# Patient Record
Sex: Female | Born: 1940
Health system: Southern US, Community
[De-identification: ages and names within clinical notes are randomized; demographics above are authoritative.]

## PROBLEM LIST (undated history)

## (undated) DIAGNOSIS — C801 Malignant (primary) neoplasm, unspecified: Secondary | ICD-10-CM

## (undated) DIAGNOSIS — D469 Myelodysplastic syndrome, unspecified: Secondary | ICD-10-CM

## (undated) DIAGNOSIS — M1990 Unspecified osteoarthritis, unspecified site: Secondary | ICD-10-CM

## (undated) DIAGNOSIS — H269 Unspecified cataract: Secondary | ICD-10-CM

## (undated) DIAGNOSIS — I10 Essential (primary) hypertension: Secondary | ICD-10-CM

## (undated) HISTORY — PX: JOINT REPLACEMENT: SHX530

## (undated) HISTORY — DX: Myelodysplastic syndrome, unspecified: D46.9

## (undated) HISTORY — PX: ABDOMINAL HYSTERECTOMY: SHX81

## (undated) HISTORY — PX: CHOLECYSTECTOMY: SHX55

## (undated) HISTORY — DX: Unspecified cataract: H26.9

## (undated) HISTORY — PX: STOMACH SURGERY: SHX791

## (undated) HISTORY — PX: APPENDECTOMY: SHX54

## (undated) HISTORY — PX: KNEE SURGERY: SHX244

## (undated) HISTORY — PX: LEG AMPUTATION ABOVE KNEE: SHX117

---

## 2009-05-12 DIAGNOSIS — E559 Vitamin D deficiency, unspecified: Secondary | ICD-10-CM | POA: Insufficient documentation

## 2009-09-19 DIAGNOSIS — M51369 Other intervertebral disc degeneration, lumbar region without mention of lumbar back pain or lower extremity pain: Secondary | ICD-10-CM | POA: Insufficient documentation

## 2009-11-05 DIAGNOSIS — K219 Gastro-esophageal reflux disease without esophagitis: Secondary | ICD-10-CM | POA: Insufficient documentation

## 2010-01-15 DIAGNOSIS — R079 Chest pain, unspecified: Secondary | ICD-10-CM | POA: Insufficient documentation

## 2010-11-13 LAB — HM DEXA SCAN: HM Dexa Scan: NEGATIVE

## 2011-02-03 DIAGNOSIS — J4520 Mild intermittent asthma, uncomplicated: Secondary | ICD-10-CM | POA: Insufficient documentation

## 2011-08-23 DIAGNOSIS — Z7901 Long term (current) use of anticoagulants: Secondary | ICD-10-CM | POA: Insufficient documentation

## 2012-12-30 DIAGNOSIS — Z8614 Personal history of Methicillin resistant Staphylococcus aureus infection: Secondary | ICD-10-CM | POA: Insufficient documentation

## 2013-08-06 DIAGNOSIS — W44F1XA Bezoar entering into or through a natural orifice, initial encounter: Secondary | ICD-10-CM | POA: Insufficient documentation

## 2013-08-06 DIAGNOSIS — T182XXA Foreign body in stomach, initial encounter: Secondary | ICD-10-CM | POA: Insufficient documentation

## 2014-01-18 LAB — HM COLONOSCOPY

## 2014-01-23 DIAGNOSIS — Z9889 Other specified postprocedural states: Secondary | ICD-10-CM | POA: Insufficient documentation

## 2014-02-26 DIAGNOSIS — I482 Chronic atrial fibrillation, unspecified: Secondary | ICD-10-CM | POA: Insufficient documentation

## 2014-05-09 DIAGNOSIS — M48061 Spinal stenosis, lumbar region without neurogenic claudication: Secondary | ICD-10-CM | POA: Insufficient documentation

## 2015-02-05 DIAGNOSIS — H1851 Endothelial corneal dystrophy: Secondary | ICD-10-CM

## 2015-02-05 DIAGNOSIS — H18519 Endothelial corneal dystrophy, unspecified eye: Secondary | ICD-10-CM | POA: Insufficient documentation

## 2015-02-05 DIAGNOSIS — Q141 Congenital malformation of retina: Secondary | ICD-10-CM | POA: Insufficient documentation

## 2015-02-05 DIAGNOSIS — H3530 Unspecified macular degeneration: Secondary | ICD-10-CM | POA: Insufficient documentation

## 2015-02-21 DIAGNOSIS — D500 Iron deficiency anemia secondary to blood loss (chronic): Secondary | ICD-10-CM | POA: Insufficient documentation

## 2015-08-19 DIAGNOSIS — N183 Chronic kidney disease, stage 3 unspecified: Secondary | ICD-10-CM | POA: Insufficient documentation

## 2015-08-19 DIAGNOSIS — E870 Hyperosmolality and hypernatremia: Secondary | ICD-10-CM | POA: Insufficient documentation

## 2015-08-19 DIAGNOSIS — H25011 Cortical age-related cataract, right eye: Secondary | ICD-10-CM | POA: Insufficient documentation

## 2015-08-19 DIAGNOSIS — N1832 Chronic kidney disease, stage 3b: Secondary | ICD-10-CM | POA: Insufficient documentation

## 2015-08-19 DIAGNOSIS — H04123 Dry eye syndrome of bilateral lacrimal glands: Secondary | ICD-10-CM | POA: Insufficient documentation

## 2015-08-19 DIAGNOSIS — Z961 Presence of intraocular lens: Secondary | ICD-10-CM | POA: Insufficient documentation

## 2015-08-19 DIAGNOSIS — H2511 Age-related nuclear cataract, right eye: Secondary | ICD-10-CM | POA: Insufficient documentation

## 2015-08-19 DIAGNOSIS — Z9842 Cataract extraction status, left eye: Secondary | ICD-10-CM

## 2016-01-30 DIAGNOSIS — Z86711 Personal history of pulmonary embolism: Secondary | ICD-10-CM | POA: Insufficient documentation

## 2016-02-05 LAB — HM MAMMOGRAPHY

## 2016-02-06 DIAGNOSIS — M810 Age-related osteoporosis without current pathological fracture: Secondary | ICD-10-CM | POA: Insufficient documentation

## 2016-05-28 ENCOUNTER — Emergency Department (INDEPENDENT_AMBULATORY_CARE_PROVIDER_SITE_OTHER): Payer: Medicare HMO

## 2016-05-28 ENCOUNTER — Emergency Department
Admission: EM | Admit: 2016-05-28 | Discharge: 2016-05-28 | Disposition: A | Discharge status: Home / Self Care | Payer: Medicare HMO | Source: Home / Self Care | Attending: Family Medicine | Admitting: Family Medicine

## 2016-05-28 ENCOUNTER — Encounter: Payer: Self-pay | Admitting: Emergency Medicine

## 2016-05-28 DIAGNOSIS — S93602A Unspecified sprain of left foot, initial encounter: Secondary | ICD-10-CM

## 2016-05-28 DIAGNOSIS — M2012 Hallux valgus (acquired), left foot: Secondary | ICD-10-CM | POA: Diagnosis not present

## 2016-05-28 DIAGNOSIS — M85872 Other specified disorders of bone density and structure, left ankle and foot: Secondary | ICD-10-CM | POA: Diagnosis not present

## 2016-05-28 HISTORY — DX: Malignant (primary) neoplasm, unspecified: C80.1

## 2016-05-28 HISTORY — DX: Essential (primary) hypertension: I10

## 2016-05-28 NOTE — Discharge Instructions (Signed)
Apply ice pack for 15 to 20 minutes, 3 to 4 times daily.  Elevate.  Use crutches for 3 to 5 days.  Wear Ace wrap until swelling decreases.  Begin range of motion and stretching exercises in about 5 days as per instruction sheet.  May take Tylenol as needed for pain.

## 2016-05-28 NOTE — ED Triage Notes (Signed)
Left foot injury 2 days ago, was sleeping in recliner suddenly jumped up and landed hard on floor with left foot felt a sharp, piercing pain up to right knee. She has a rod in the leg from when she had cancer at age 76. Pain from foot radiates up to right knee.

## 2016-05-28 NOTE — ED Provider Notes (Signed)
[name]    CSN: [id number] Arrival date & time: 05/28/16  1503     History   Chief Complaint Chief Complaint  Patient presents with  . Foot Injury    HPI Gloria Mckay is a 75 y.o. female.   While sleeping in her recliner two days ago, patient was startled awake and jumped up, landing firmly on her left foot.  She felt a sudden piercing pain that radiated to her right knee.  She has had persistent pain with weight bearing.  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  She is presently on coumadin, reporting that her most recent INR was 2.5 last week.   The history is provided by the patient.  Foot Pain  This is a new problem. The current episode started 2 days ago. The problem occurs constantly. The problem has not changed since onset.Associated symptoms comments: none. The symptoms are aggravated by walking. The symptoms are relieved by narcotics. She has tried nothing for the symptoms.    Past Medical History:  Diagnosis Date  . Cancer ([place])   . Hypertension     There are no active problems to display for this patient.   Past Surgical History:  Procedure Laterality Date  . JOINT REPLACEMENT    . STOMACH SURGERY      OB History    No data available       Home Medications    Prior to Admission medications   Medication Sig Start Date End Date Taking? Authorizing Provider  carvedilol (COREG) 25 MG tablet Take 25 mg by mouth 2 (two) times daily with a meal.   Yes Historical Provider, MD  clindamycin (CLEOCIN) 300 MG capsule Take 300 mg by mouth 3 (three) times daily.   Yes Historical Provider, MD  ergocalciferol (VITAMIN D2) 50000 units capsule Take 50,000 Units by mouth once a week.   Yes Historical Provider, MD  escitalopram (LEXAPRO) 10 MG tablet Take 10 mg by mouth daily.   Yes Historical Provider, MD  hydrALAZINE (APRESOLINE) 10 MG tablet Take 10 mg by mouth 3 (three) times daily.   Yes Historical Provider, MD    HYDROcodone-acetaminophen (NORCO) 7.5-325 MG tablet Take 1 tablet by mouth every 6 (six) hours as needed for moderate pain.   Yes Historical Provider, MD  losartan (COZAAR) 100 MG tablet Take 100 mg by mouth daily.   Yes Historical Provider, MD  topiramate (TOPAMAX) 100 MG tablet Take 100 mg by mouth 2 (two) times daily.   Yes Historical Provider, MD  warfarin (COUMADIN) 7.5 MG tablet Take 7.5 mg by mouth daily.   Yes Historical Provider, MD    Family History No family history on file.  Social History Social History  Substance Use Topics  . Smoking status: Never Smoker  . Smokeless tobacco: Never Used  . Alcohol use No     Allergies   Review of patient's allergies indicates no known allergies.   Review of Systems Review of Systems  All other systems reviewed and are negative.    Physical Exam Triage Vital Signs ED Triage Vitals  Enc Vitals Group     BP 05/28/16 1529 186/84     Pulse Rate 05/28/16 1529 63     Resp --      Temp 05/28/16 1529 97.7 F (36.5 C)     Temp Source 05/28/16 1529 Oral     SpO2 05/28/16 1529 99 %     Weight 05/28/16 1530  111 lb (50.3 kg)     Height 05/28/16 1530 5\' 3"  (1.6 m)     Head Circumference --      Peak Flow --      Pain Score 05/28/16 1535 7     Pain Loc --      Pain Edu? --      Excl. in [place]? --    No data found.   Updated Vital Signs BP 186/84 (BP Location: Left Arm)   Pulse 63   Temp 97.7 F (36.5 C) (Oral)   Ht 5\' 3"  (1.6 m)   Wt 111 lb (50.3 kg)   SpO2 99%   BMI 19.66 kg/m   Visual Acuity Right Eye Distance:   Left Eye Distance:   Bilateral Distance:    Right Eye Near:   Left Eye Near:    Bilateral Near:     Physical Exam  Constitutional: She appears well-developed and well-nourished. No distress.  HENT:  Head: Atraumatic.  Nose: Nose normal.  Mouth/Throat: Oropharynx is clear and moist.  Eyes: Conjunctivae are normal. Pupils are equal, round, and reactive to light.  Neck: Normal range of motion.   Cardiovascular: Normal heart sounds.   Pulmonary/Chest: Breath sounds normal.  Abdominal: There is no tenderness.  Musculoskeletal: She exhibits no edema.       Left foot: There is tenderness, bony tenderness, decreased capillary refill and laceration. There is normal range of motion, no swelling, no crepitus and no deformity.       Feet:  Left foot has diffuse dorsal tenderness as noted on diagram.  Distal neurovascular function is intact.  Hallux valgus is present.  Neurological: She is alert.  Skin: Skin is warm and dry.  Nursing note and vitals reviewed.    UC Treatments / Results  Labs (all labs ordered are listed, but only abnormal results are displayed) Labs Reviewed - No data to display  EKG  EKG Interpretation None       Radiology Dg Foot Complete Left  Result Date: 05/28/2016 CLINICAL DATA:  Dorsal foot pain over the metatarsals after injury 2 days ago. EXAM: LEFT FOOT - COMPLETE 3+ VIEW COMPARISON:  None. FINDINGS: The bones of the left foot are osteopenic in appearance without acute displaced fracture nor bone destruction. There is overlap of the great toe with second toe. There is a hallux valgus with first metatarsophalangeal angle of 65 degrees, normal less than 15 degrees. IMPRESSION: Hallux valgus; osteopenia.  No acute fracture or bone destruction. Electronically Signed   By: Ashley Royalty M.D.   On: 05/28/2016 16:32    Procedures Procedures (including critical care time)  Medications Ordered in UC Medications - No data to display   Initial Impression / Assessment and Plan / UC Course  I have reviewed the triage vital signs and the nursing notes.  Pertinent labs & imaging results that were available during my care of the patient were reviewed by me and considered in my medical decision making (see chart for details).  Clinical Course  Ace wrap applied.  Dispensed crutches Apply ice pack for 15 to 20 minutes, 3 to 4 times daily.  Elevate.  Use crutches  for 3 to 5 days.  Wear Ace wrap until swelling decreases.  Begin range of motion and stretching exercises in about 5 days as per instruction sheet.  May take Tylenol as needed for pain. Followup with Dr. Aundria Mems or Dr. Lynne Leader ([facility]) if not improving about two weeks.  Final Clinical Impressions(s) / UC Diagnoses   Final diagnoses:  Foot sprain, left, initial encounter    New Prescriptions New Prescriptions   No medications on file     Kandra Nicolas, MD 06/01/16 2214

## 2016-06-14 DIAGNOSIS — D101 Benign neoplasm of tongue: Secondary | ICD-10-CM | POA: Insufficient documentation

## 2016-09-13 DIAGNOSIS — I482 Chronic atrial fibrillation: Secondary | ICD-10-CM | POA: Diagnosis not present

## 2016-09-13 DIAGNOSIS — Z86718 Personal history of other venous thrombosis and embolism: Secondary | ICD-10-CM | POA: Diagnosis not present

## 2016-09-13 DIAGNOSIS — D500 Iron deficiency anemia secondary to blood loss (chronic): Secondary | ICD-10-CM | POA: Diagnosis not present

## 2016-09-13 DIAGNOSIS — Z7901 Long term (current) use of anticoagulants: Secondary | ICD-10-CM | POA: Diagnosis not present

## 2016-09-29 DIAGNOSIS — Z86718 Personal history of other venous thrombosis and embolism: Secondary | ICD-10-CM | POA: Diagnosis not present

## 2016-09-29 DIAGNOSIS — D649 Anemia, unspecified: Secondary | ICD-10-CM | POA: Diagnosis not present

## 2016-09-29 DIAGNOSIS — I482 Chronic atrial fibrillation: Secondary | ICD-10-CM | POA: Diagnosis not present

## 2016-09-29 DIAGNOSIS — Z7901 Long term (current) use of anticoagulants: Secondary | ICD-10-CM | POA: Diagnosis not present

## 2016-09-29 DIAGNOSIS — D500 Iron deficiency anemia secondary to blood loss (chronic): Secondary | ICD-10-CM | POA: Diagnosis not present

## 2016-10-01 LAB — POCT INR
INR: 2.2
INR: 2.2
INR: 2.2
INR: 2.2

## 2016-10-04 DIAGNOSIS — S79912A Unspecified injury of left hip, initial encounter: Secondary | ICD-10-CM | POA: Diagnosis not present

## 2016-10-04 DIAGNOSIS — K148 Other diseases of tongue: Secondary | ICD-10-CM | POA: Diagnosis not present

## 2016-10-04 DIAGNOSIS — T8450XS Infection and inflammatory reaction due to unspecified internal joint prosthesis, sequela: Secondary | ICD-10-CM | POA: Diagnosis not present

## 2016-10-04 DIAGNOSIS — Z780 Asymptomatic menopausal state: Secondary | ICD-10-CM | POA: Diagnosis not present

## 2016-10-05 DIAGNOSIS — M25562 Pain in left knee: Secondary | ICD-10-CM | POA: Diagnosis not present

## 2016-10-05 DIAGNOSIS — D509 Iron deficiency anemia, unspecified: Secondary | ICD-10-CM | POA: Diagnosis not present

## 2016-10-05 DIAGNOSIS — M7062 Trochanteric bursitis, left hip: Secondary | ICD-10-CM | POA: Diagnosis not present

## 2016-10-05 DIAGNOSIS — D500 Iron deficiency anemia secondary to blood loss (chronic): Secondary | ICD-10-CM | POA: Diagnosis not present

## 2016-10-05 DIAGNOSIS — K148 Other diseases of tongue: Secondary | ICD-10-CM | POA: Diagnosis not present

## 2016-10-14 ENCOUNTER — Emergency Department (INDEPENDENT_AMBULATORY_CARE_PROVIDER_SITE_OTHER): Payer: Medicare Other

## 2016-10-14 ENCOUNTER — Emergency Department (INDEPENDENT_AMBULATORY_CARE_PROVIDER_SITE_OTHER)
Admission: EM | Admit: 2016-10-14 | Discharge: 2016-10-14 | Disposition: A | Discharge status: Home / Self Care | Payer: Medicare Other | Source: Home / Self Care | Attending: Family Medicine | Admitting: Family Medicine

## 2016-10-14 ENCOUNTER — Encounter: Payer: Self-pay | Admitting: *Deleted

## 2016-10-14 DIAGNOSIS — W1849XA Other slipping, tripping and stumbling without falling, initial encounter: Secondary | ICD-10-CM

## 2016-10-14 DIAGNOSIS — S93401A Sprain of unspecified ligament of right ankle, initial encounter: Secondary | ICD-10-CM

## 2016-10-14 DIAGNOSIS — M25552 Pain in left hip: Secondary | ICD-10-CM

## 2016-10-14 DIAGNOSIS — M85871 Other specified disorders of bone density and structure, right ankle and foot: Secondary | ICD-10-CM

## 2016-10-14 DIAGNOSIS — S99921A Unspecified injury of right foot, initial encounter: Secondary | ICD-10-CM | POA: Diagnosis not present

## 2016-10-14 DIAGNOSIS — M25571 Pain in right ankle and joints of right foot: Secondary | ICD-10-CM | POA: Diagnosis not present

## 2016-10-14 DIAGNOSIS — S99911A Unspecified injury of right ankle, initial encounter: Secondary | ICD-10-CM | POA: Diagnosis not present

## 2016-10-14 DIAGNOSIS — M79671 Pain in right foot: Secondary | ICD-10-CM

## 2016-10-14 DIAGNOSIS — S79912A Unspecified injury of left hip, initial encounter: Secondary | ICD-10-CM | POA: Diagnosis not present

## 2016-10-14 HISTORY — DX: Unspecified osteoarthritis, unspecified site: M19.90

## 2016-10-14 NOTE — ED Provider Notes (Signed)
CSN: [id number]     Arrival date & time 10/14/16  1324 History   First MD Initiated Contact with Patient 10/14/16 1356     Chief Complaint  Patient presents with  . Hip Pain  . Foot Pain   (Consider location/radiation/quality/duration/timing/severity/associated sxs/prior Treatment) HPI Gloria Mckay is a 76 y.o. female presenting to UC with c/o Left hip and pain Right ankle and foot pain with mild swelling to the outside of foot for about 1 week after she slipped on some wet leaves outside. Pt uses a cane due to chronic Left hip and leg pain secondary to having a rod placed due to cancer in that leg when she was 76 years old.  She was seen by PCP and her orthopedist last week but no imaging was performed.  Orthopedist did give her a steroid shot in her Left upper thigh, per pt, but pt notes pain feels higher "in my hip joint" than where the injection was placed.  Denies numbness or tingling in legs. Denies change in bowel or bladder habits. Denies redness or bruising to skin. No hx of gout.    Past Medical History:  Diagnosis Date  . Arthritis   . Cancer ([place])   . Hypertension    Past Surgical History:  Procedure Laterality Date  . JOINT REPLACEMENT    . STOMACH SURGERY     History reviewed. No pertinent family history. Social History  Substance Use Topics  . Smoking status: Never Smoker  . Smokeless tobacco: Never Used  . Alcohol use No   OB History    No data available     Review of Systems  Musculoskeletal: Positive for arthralgias, gait problem, joint swelling and myalgias. Negative for back pain, neck pain and neck stiffness.  Skin: Negative for color change, rash and wound.  Neurological: Negative for weakness and numbness.    Allergies  Penicillins and Sulfa antibiotics  Home Medications   Prior to Admission medications   Medication Sig Start Date End Date Taking? Authorizing Provider  carvedilol (COREG) 25 MG tablet Take 25 mg by mouth 2 (two) times daily with a  meal.    Historical Provider, MD  clindamycin (CLEOCIN) 300 MG capsule Take 300 mg by mouth 3 (three) times daily.    Historical Provider, MD  ergocalciferol (VITAMIN D2) 50000 units capsule Take 50,000 Units by mouth once a week.    Historical Provider, MD  escitalopram (LEXAPRO) 10 MG tablet Take 10 mg by mouth daily.    Historical Provider, MD  hydrALAZINE (APRESOLINE) 10 MG tablet Take 10 mg by mouth 3 (three) times daily.    Historical Provider, MD  HYDROcodone-acetaminophen (NORCO) 7.5-325 MG tablet Take 1 tablet by mouth every 6 (six) hours as needed for moderate pain.    Historical Provider, MD  losartan (COZAAR) 100 MG tablet Take 100 mg by mouth daily.    Historical Provider, MD  topiramate (TOPAMAX) 100 MG tablet Take 100 mg by mouth 2 (two) times daily.    Historical Provider, MD  warfarin (COUMADIN) 7.5 MG tablet Take 7.5 mg by mouth daily.    Historical Provider, MD   Meds Ordered and Administered this Visit  Medications - No data to display  BP 164/87 (BP Location: Left Arm)   Pulse 60   Temp 98 F (36.7 C) (Oral)   Resp 16   Ht 5\' 3"  (1.6 m)   Wt 113 lb (51.3 kg)   SpO2 98%   BMI 20.02 kg/m  No data  found.   Physical Exam  Constitutional: She is oriented to person, place, and time. She appears well-developed and well-nourished.  HENT:  Head: Normocephalic and atraumatic.  Eyes: EOM are normal.  Neck: Normal range of motion.  Cardiovascular: Normal rate.   Pulmonary/Chest: Effort normal.  Musculoskeletal: Normal range of motion. She exhibits edema and tenderness.  Right foot and ankle: mild edema to lateral aspect, tender. Full ROM. Right calf is soft, non-tender. Left hip: mild tenderness to hip joint. No crepitus.  Neurological: She is alert and oriented to person, place, and time.  Skin: Skin is warm and dry.  Psychiatric: She has a normal mood and affect. Her behavior is normal.  Nursing note and vitals reviewed.   Urgent Care Course     Procedures  (including critical care time)  Labs Review Labs Reviewed - No data to display  Imaging Review Dg Ankle Complete Right  Result Date: 10/14/2016 CLINICAL DATA:  Slip and fall a few weeks ago with persistent ankle pain, initial encounter EXAM: RIGHT ANKLE - COMPLETE 3+ VIEW COMPARISON:  None. FINDINGS: There is no evidence of fracture, dislocation, or joint effusion. There is no evidence of arthropathy or other focal bone abnormality. Soft tissues are unremarkable. IMPRESSION: Show no acute abnormality noted. Electronically Signed   By: Inez Catalina M.D.   On: 10/14/2016 14:55   Dg Foot Complete Right  Result Date: 10/14/2016 CLINICAL DATA:  Injury. EXAM: RIGHT FOOT COMPLETE - 3+ VIEW COMPARISON:  No recent prior. FINDINGS: Diffuse osteopenia degenerative change. No acute bony or joint abnormality identified. No evidence of fracture dislocation. IMPRESSION: No acute abnormality.  Diffuse osteopenia and degenerative change. Electronically Signed   By: Marcello Moores  Register   On: 10/14/2016 14:54   Dg Hip Unilat W Or Wo Pelvis 2-3 Views Left  Result Date: 10/14/2016 CLINICAL DATA:  Recent slip and fall with left hip pain, initial encounter EXAM: DG HIP (WITH OR WITHOUT PELVIS) 2-3V LEFT COMPARISON:  None. FINDINGS: Proximal left femur is within normal limits. Mild irregularity is noted along the superior cortex of the left superior pubic ramus. It would be difficult to exclude an undisplaced fracture on this exam. CT may be helpful for further evaluation as clinically indicated. No soft tissue abnormality is noted. IMPRESSION: Cortical irregularity along the superior pubic ramus on the left. This may represent an undisplaced fracture. CT may be helpful for further evaluation as necessary Electronically Signed   By: Inez Catalina M.D.   On: 10/14/2016 14:51      MDM   1. Other slipping, tripping and stumbling without falling, initial encounter   2. Pain in right ankle and joints of right foot   3.  Right foot pain   4. Left hip pain   5. Sprain of right ankle, unspecified ligament, initial encounter    Hx and exam c/w Right ankle sprain and possible superior pubic ramus fracture.  ASO splint applied to ankle for comfort. Pt already taking hydrocodone as needed for pain. Encouraged f/u with Dr. Georgina Snell, Sports Medicine, next week for recheck of symptoms and possible CT scan. Patient verbalized understanding and agreement with treatment plan.     Noland Fordyce, PA-C 10/14/16 [phone]

## 2016-10-14 NOTE — ED Triage Notes (Signed)
Pt c/o RT foot pain and LT hip pain x 1 wk, post slip; denies fall.

## 2016-10-21 ENCOUNTER — Emergency Department (INDEPENDENT_AMBULATORY_CARE_PROVIDER_SITE_OTHER): Payer: Medicare Other

## 2016-10-21 ENCOUNTER — Encounter: Payer: Self-pay | Admitting: *Deleted

## 2016-10-21 ENCOUNTER — Emergency Department (INDEPENDENT_AMBULATORY_CARE_PROVIDER_SITE_OTHER)
Admission: EM | Admit: 2016-10-21 | Discharge: 2016-10-21 | Disposition: A | Discharge status: Home / Self Care | Payer: Medicare Other | Source: Home / Self Care | Attending: Family Medicine | Admitting: Family Medicine

## 2016-10-21 DIAGNOSIS — M79672 Pain in left foot: Secondary | ICD-10-CM | POA: Diagnosis not present

## 2016-10-21 DIAGNOSIS — M21072 Valgus deformity, not elsewhere classified, left ankle: Secondary | ICD-10-CM | POA: Diagnosis not present

## 2016-10-21 DIAGNOSIS — M85872 Other specified disorders of bone density and structure, left ankle and foot: Secondary | ICD-10-CM

## 2016-10-21 DIAGNOSIS — G8929 Other chronic pain: Secondary | ICD-10-CM

## 2016-10-21 NOTE — Discharge Instructions (Signed)
May continue Norco as prescribed.  May apply ice pack or heating pad as tolerated.  May continue to apply capsaisin cream.

## 2016-10-21 NOTE — ED Triage Notes (Signed)
Pt c/o LT foot pain x 10/15/16. Denies injury.

## 2016-10-21 NOTE — ED Provider Notes (Signed)
[facility]    CSN: [id number] Arrival date & time: 10/21/16  1515     History   Chief Complaint Chief Complaint  Patient presents with  . Foot Pain    HPI Gloria Mckay is a 76 y.o. female.   Patient complains of increased pain in the plantar aspect of her left foot for about two weeks, worse during the past week. She has a distant past history of osteosarcoma in her left leg and consequent multiple surgeries, including left knee total arthroplasty.  She denies any recent injury to her left foot, but admits that she recently had a minor injury to her right foot (now improved), causing her to bear more weight on her left foot.      The history is provided by the patient.  Foot Pain  This is a chronic problem. The current episode started more than 1 week ago. The problem occurs constantly. The problem has been gradually worsening. Pertinent negatives include no chest pain. The symptoms are aggravated by walking. Nothing relieves the symptoms. Treatments tried: Norco, and capsaicin cream. The treatment provided mild relief.    Past Medical History:  Diagnosis Date  . Arthritis   . Cancer ([place])   . Hypertension     There are no active problems to display for this patient.   Past Surgical History:  Procedure Laterality Date  . JOINT REPLACEMENT    . STOMACH SURGERY      OB History    No data available       Home Medications    Prior to Admission medications   Medication Sig Start Date End Date Taking? Authorizing Provider  carvedilol (COREG) 25 MG tablet Take 25 mg by mouth 2 (two) times daily with a meal.    Historical Provider, MD  clindamycin (CLEOCIN) 300 MG capsule Take 300 mg by mouth 3 (three) times daily.    Historical Provider, MD  ergocalciferol (VITAMIN D2) 50000 units capsule Take 50,000 Units by mouth once a week.    Historical Provider, MD  escitalopram (LEXAPRO) 10 MG tablet Take 10 mg by mouth daily.    Historical Provider, MD    hydrALAZINE (APRESOLINE) 10 MG tablet Take 10 mg by mouth 3 (three) times daily.    Historical Provider, MD  HYDROcodone-acetaminophen (NORCO) 7.5-325 MG tablet Take 1 tablet by mouth every 6 (six) hours as needed for moderate pain.    Historical Provider, MD  losartan (COZAAR) 100 MG tablet Take 100 mg by mouth daily.    Historical Provider, MD  topiramate (TOPAMAX) 100 MG tablet Take 100 mg by mouth 2 (two) times daily.    Historical Provider, MD  warfarin (COUMADIN) 7.5 MG tablet Take 7.5 mg by mouth daily.    Historical Provider, MD    Family History History reviewed. No pertinent family history.  Social History Social History  Substance Use Topics  . Smoking status: Never Smoker  . Smokeless tobacco: Never Used  . Alcohol use No     Allergies   Penicillins and Sulfa antibiotics   Review of Systems Review of Systems  Cardiovascular: Negative for chest pain.  Musculoskeletal:       Left foot pain.  All other systems reviewed and are negative.    Physical Exam Triage Vital Signs ED Triage Vitals  Enc Vitals Group     BP 10/21/16 1531 151/78     Pulse Rate 10/21/16 1531 69     Resp 10/21/16 1531 16  Temp 10/21/16 1531 98.1 F (36.7 C)     Temp Source 10/21/16 1531 Oral     SpO2 10/21/16 1531 96 %     Weight 10/21/16 1531 113 lb (51.3 kg)     Height --      Head Circumference --      Peak Flow --      Pain Score 10/21/16 1533 8     Pain Loc --      Pain Edu? --      Excl. in [place]? --    No data found.   Updated Vital Signs BP 151/78 (BP Location: Left Arm)   Pulse 69   Temp 98.1 F (36.7 C) (Oral)   Resp 16   Wt 113 lb (51.3 kg)   SpO2 96%   BMI 20.02 kg/m   Visual Acuity Right Eye Distance:   Left Eye Distance:   Bilateral Distance:    Right Eye Near:   Left Eye Near:    Bilateral Near:     Physical Exam  Constitutional: She appears well-developed and well-nourished. No distress.  HENT:  Head: Normocephalic.  Mouth/Throat: Oropharynx  is clear and moist.  Eyes: Conjunctivae are normal. Pupils are equal, round, and reactive to light.  Neck: Normal range of motion.  Cardiovascular: Normal heart sounds.   Pulmonary/Chest: Breath sounds normal.  Abdominal: There is no tenderness.  Musculoskeletal: She exhibits no edema.       Left foot: There is tenderness.  Left foot reveals severe hallux valgus deformity of the first MTP joint.  No swelling, erythema, or warmth.   There is distinct tenderness to palpation over medial and lateral malleoli.  There is tenderness to palpation over insertion of achilles tendon.  There is tenderness to palpation over the plantar fascia at insertion calcaneus.  Neurological: She is alert.  Skin: Skin is warm and dry.  Nursing note and vitals reviewed.    UC Treatments / Results  Labs (all labs ordered are listed, but only abnormal results are displayed) Labs Reviewed - No data to display  EKG  EKG Interpretation None       Radiology Dg Foot Complete Left  Result Date: 10/21/2016 CLINICAL DATA:  Left mid foot pain, worse over the last 2 weeks EXAM: LEFT FOOT - COMPLETE 3+ VIEW COMPARISON:  05/28/2016 FINDINGS: Marked osteopenia. Extensive hallux valgus deformity of the left first MTP joint resulting in overlap of the first and second toes. Again, this roughly measures 65 degrees. No definite acute osseous finding or soft tissue abnormality. No significant interval change. IMPRESSION: Chronic left hallux valgus deformity. Osteopenia No acute finding by plain radiography Electronically Signed   By: Jerilynn Mages.  Shick M.D.   On: 10/21/2016 17:01    Procedures Procedures (including critical care time)  Medications Ordered in UC Medications - No data to display   Initial Impression / Assessment and Plan / UC Course  I have reviewed the triage vital signs and the nursing notes.  Pertinent labs & imaging results that were available during my care of the patient were reviewed by me and considered  in my medical decision making (see chart for details).    Chronic left foot pain exacerbated by recent injury to right foot. May continue Norco as prescribed.  May apply ice pack or heating pad as tolerated.  May continue to apply capsaisin cream. Followup with Dr. Aundria Mems or Dr. Lynne Leader ([facility]) as soon as possible for further evaluation of her chronic foot  pain.      Final Clinical Impressions(s) / UC Diagnoses   Final diagnoses:  Chronic foot pain, left    New Prescriptions New Prescriptions   No medications on file     Kandra Nicolas, MD 11/01/16 1557

## 2016-10-22 ENCOUNTER — Encounter: Payer: Self-pay | Admitting: Sports Medicine

## 2016-10-22 ENCOUNTER — Ambulatory Visit (INDEPENDENT_AMBULATORY_CARE_PROVIDER_SITE_OTHER): Payer: Medicare Other | Admitting: Sports Medicine

## 2016-10-22 DIAGNOSIS — M7989 Other specified soft tissue disorders: Secondary | ICD-10-CM | POA: Diagnosis not present

## 2016-10-22 DIAGNOSIS — M79672 Pain in left foot: Secondary | ICD-10-CM | POA: Diagnosis not present

## 2016-10-22 DIAGNOSIS — M722 Plantar fascial fibromatosis: Secondary | ICD-10-CM | POA: Diagnosis not present

## 2016-10-22 MED ORDER — GABAPENTIN 300 MG PO CAPS: ORAL_CAPSULE | ORAL | 3 refills | Status: DC

## 2016-10-22 NOTE — Progress Notes (Signed)
   Subjective:    I'm seeing this patient as a consultation for:  Dr. Theone Murdoch, Dr. Gwenlyn Perking  CC: Left foot pain  HPI: This is a pleasant 76 year old female, she is had polytrauma of the left lower extremity and is essentially post left total knee arthroplasty, for sometime now she's had pain that she localizes on the plantar aspect of her left foot, with severe swelling of the left lower extremity. Pain is severe, persistent, has never had injections, therapy.  Past medical history:  Negative.  See flowsheet/record as well for more information.  Surgical history: Negative.  See flowsheet/record as well for more information.  Family history: Negative.  See flowsheet/record as well for more information.  Social history: Negative.  See flowsheet/record as well for more information.  Allergies, and medications have been entered into the medical record, reviewed, and no changes needed.   Review of Systems: No headache, visual changes, nausea, vomiting, diarrhea, constipation, dizziness, abdominal pain, skin rash, fevers, chills, night sweats, weight loss, swollen lymph nodes, body aches, joint swelling, muscle aches, chest pain, shortness of breath, mood changes, visual or auditory hallucinations.   Objective:   General: Well Developed, well nourished, and in no acute distress.  Neuro/Psych: Alert and oriented x3, extra-ocular muscles intact, able to move all 4 extremities, sensation grossly intact. Skin: Warm and dry, no rashes noted.  Respiratory: Not using accessory muscles, speaking in full sentences, trachea midline.  Cardiovascular: Pulses palpable, no extremity edema. Abdomen: Does not appear distended. Left foot: Tender to palpation at the calcaneal origin of the plantar fascia, significant swelling in the left lower extremity more so than the right with a positive Homans sign.  Impression and Recommendations:   This case required medical decision making of moderate  complexity.  Left leg swelling Left lower extremity ultrasound.  Patient is on warfarin and has a vena cava filter, if she has a DVT this would be a failure of warfarin and she would need to be transitioned to a NOAC she will also discontinue her knee sleeve and just wear the lower extremity compression hose.   Plantar fasciitis, left Patient declines injection. Physical therapy, gel cups. Return to see me in 3 weeks, injection if no better.

## 2016-10-22 NOTE — Assessment & Plan Note (Addendum)
Left lower extremity ultrasound.  Patient is on warfarin and has a vena cava filter, if she has a DVT this would be a failure of warfarin and she would need to be transitioned to a NOAC she will also discontinue her knee sleeve and just wear the lower extremity compression hose.  Stat lower extremity dopplers show a Baker cyst but no evidence of deep vein thrombosis.

## 2016-10-22 NOTE — Assessment & Plan Note (Addendum)
Patient declines injection. Physical therapy, gel cups. Return to see me in 3 weeks, injection if no better.

## 2016-10-25 ENCOUNTER — Ambulatory Visit (INDEPENDENT_AMBULATORY_CARE_PROVIDER_SITE_OTHER): Payer: Medicare Other | Admitting: Sports Medicine

## 2016-10-25 ENCOUNTER — Encounter: Payer: Self-pay | Admitting: Sports Medicine

## 2016-10-25 DIAGNOSIS — M722 Plantar fascial fibromatosis: Secondary | ICD-10-CM

## 2016-10-25 DIAGNOSIS — Z96652 Presence of left artificial knee joint: Secondary | ICD-10-CM | POA: Diagnosis not present

## 2016-10-25 DIAGNOSIS — M25562 Pain in left knee: Secondary | ICD-10-CM

## 2016-10-25 NOTE — Assessment & Plan Note (Signed)
Having multiple complications, referral to [facility] orthopedics. Previous surgeries were with [facility]. Patient desires a second opinion.

## 2016-10-25 NOTE — Assessment & Plan Note (Signed)
Heel cups were not effective, lower extremity Dopplers were negative. Injection as above.

## 2016-10-25 NOTE — Progress Notes (Signed)
  Subjective:    CC: Follow-up  HPI: Left heel pain: Clinically resembles plantar fasciitis, did not respond to gel cups and other conservative measures, did not have a good response to gabapentin. Agreeable to proceed with injection.  Left knee pain: Is post total arthroplasty, polytrauma, she would like a second opinion from a different orthopedic surgeon.  Past medical history:  Negative.  See flowsheet/record as well for more information.  Surgical history: Negative.  See flowsheet/record as well for more information.  Family history: Negative.  See flowsheet/record as well for more information.  Social history: Negative.  See flowsheet/record as well for more information.  Allergies, and medications have been entered into the medical record, reviewed, and no changes needed.   Review of Systems: No fevers, chills, night sweats, weight loss, chest pain, or shortness of breath.   Objective:    General: Well Developed, well nourished, and in no acute distress.  Neuro: Alert and oriented x3, extra-ocular muscles intact, sensation grossly intact.  HEENT: Normocephalic, atraumatic, pupils equal round reactive to light, neck supple, no masses, no lymphadenopathy, thyroid nonpalpable.  Skin: Warm and dry, no rashes. Cardiac: Regular rate and rhythm, no murmurs rubs or gallops, no lower extremity edema.  Respiratory: Clear to auscultation bilaterally. Not using accessory muscles, speaking in full sentences. Left Foot: No visible erythema or swelling. Range of motion is full in all directions. Strength is 5/5 in all directions. No hallux valgus. No pes cavus or pes planus. No abnormal callus noted. No pain over the navicular prominence, or base of fifth metatarsal. Tender to palpation of the calcaneal insertion of plantar fascia. No pain at the Achilles insertion. No pain over the calcaneal bursa. No pain of the retrocalcaneal bursa. No tenderness to palpation over the tarsals,  metatarsals, or phalanges. No hallux rigidus or limitus. No tenderness palpation over interphalangeal joints. No pain with compression of the metatarsal heads. Neurovascularly intact distally.   Procedure: Real-time Ultrasound Guided Injection of left plantar fascia origin Device: GE Logiq E  Verbal informed consent obtained.  Time-out conducted.  Noted no overlying erythema, induration, or other signs of local infection.  Skin prepped in a sterile fashion.  Local anesthesia: Topical Ethyl chloride.  With sterile technique and under real time ultrasound guidance:  25-gauge needle advanced just deep to the origin of the plantar fascia, 1 mL kenalog 40, 1 mL lidocaine, 1 mL Marcaine injected easily. Completed without difficulty  Pain immediately resolved suggesting accurate placement of the medication.  Advised to call if fevers/chills, erythema, induration, drainage, or persistent bleeding.  Images permanently stored and available for review in the ultrasound unit.  Impression: Technically successful ultrasound guided injection.  Impression and Recommendations:    Plantar fasciitis, left Heel cups were not effective, lower extremity Dopplers were negative. Injection as above.   History of arthroplasty of left knee Having multiple complications, referral to [facility] orth[facility]ics. Previous surgeries were with [facility]. Patient desires a second opinion.

## 2016-10-27 DIAGNOSIS — D500 Iron deficiency anemia secondary to blood loss (chronic): Secondary | ICD-10-CM | POA: Diagnosis not present

## 2016-10-27 DIAGNOSIS — I482 Chronic atrial fibrillation: Secondary | ICD-10-CM | POA: Diagnosis not present

## 2016-10-27 DIAGNOSIS — Z7901 Long term (current) use of anticoagulants: Secondary | ICD-10-CM | POA: Diagnosis not present

## 2016-10-27 DIAGNOSIS — Z86718 Personal history of other venous thrombosis and embolism: Secondary | ICD-10-CM | POA: Diagnosis not present

## 2016-10-28 ENCOUNTER — Encounter: Payer: Self-pay | Admitting: Osteopathic Medicine

## 2016-10-28 ENCOUNTER — Ambulatory Visit (INDEPENDENT_AMBULATORY_CARE_PROVIDER_SITE_OTHER): Payer: Medicare Other | Admitting: Osteopathic Medicine

## 2016-10-28 VITALS — BP 135/69 | HR 57 | Ht 63.0 in | Wt 114.0 lb

## 2016-10-28 DIAGNOSIS — Z96652 Presence of left artificial knee joint: Secondary | ICD-10-CM | POA: Diagnosis not present

## 2016-10-28 DIAGNOSIS — Z86711 Personal history of pulmonary embolism: Secondary | ICD-10-CM

## 2016-10-28 DIAGNOSIS — Z8614 Personal history of Methicillin resistant Staphylococcus aureus infection: Secondary | ICD-10-CM | POA: Diagnosis not present

## 2016-10-28 DIAGNOSIS — M79605 Pain in left leg: Secondary | ICD-10-CM

## 2016-10-28 DIAGNOSIS — R791 Abnormal coagulation profile: Secondary | ICD-10-CM | POA: Diagnosis not present

## 2016-10-28 DIAGNOSIS — I129 Hypertensive chronic kidney disease with stage 1 through stage 4 chronic kidney disease, or unspecified chronic kidney disease: Secondary | ICD-10-CM | POA: Diagnosis not present

## 2016-10-28 DIAGNOSIS — Z86718 Personal history of other venous thrombosis and embolism: Secondary | ICD-10-CM

## 2016-10-28 DIAGNOSIS — N183 Chronic kidney disease, stage 3 unspecified: Secondary | ICD-10-CM

## 2016-10-28 DIAGNOSIS — Z7901 Long term (current) use of anticoagulants: Secondary | ICD-10-CM | POA: Diagnosis not present

## 2016-10-28 DIAGNOSIS — M722 Plantar fascial fibromatosis: Secondary | ICD-10-CM | POA: Diagnosis not present

## 2016-10-28 DIAGNOSIS — C419 Malignant neoplasm of bone and articular cartilage, unspecified: Secondary | ICD-10-CM | POA: Diagnosis not present

## 2016-10-28 DIAGNOSIS — R609 Edema, unspecified: Secondary | ICD-10-CM | POA: Diagnosis not present

## 2016-10-28 LAB — CBC WITH DIFFERENTIAL/PLATELET
BASOS ABS: 49 {cells}/uL (ref 0–200)
BASOS PCT: 1 %
EOS ABS: 147 {cells}/uL (ref 15–500)
Eosinophils Relative: 3 %
HEMATOCRIT: 36 % (ref 35.0–45.0)
HEMOGLOBIN: 11.7 g/dL (ref 11.7–15.5)
LYMPHS ABS: 1225 {cells}/uL (ref 850–3900)
Lymphocytes Relative: 25 %
MCH: 32.7 pg (ref 27.0–33.0)
MCHC: 32.5 g/dL (ref 32.0–36.0)
MCV: 100.6 fL — AB (ref 80.0–100.0)
MONO ABS: 490 {cells}/uL (ref 200–950)
MPV: 9 fL (ref 7.5–12.5)
Monocytes Relative: 10 %
NEUTROS ABS: 2989 {cells}/uL (ref 1500–7800)
Neutrophils Relative %: 61 %
Platelets: 263 10*3/uL (ref 140–400)
RBC: 3.58 MIL/uL — ABNORMAL LOW (ref 3.80–5.10)
RDW: 13.5 % (ref 11.0–15.0)
WBC: 4.9 10*3/uL (ref 3.8–10.8)

## 2016-10-28 NOTE — Patient Instructions (Addendum)
[place]: [phone] for orthopedic second opinion  I'll also consult with Dr. Darene Lamer if there is anything else he would recommend  Labs today

## 2016-10-28 NOTE — Addendum Note (Signed)
Addended by: Maryla Morrow on: 10/28/2016 02:50 PM   Modules accepted: Level of Service

## 2016-10-28 NOTE — Progress Notes (Addendum)
HPI: Gloria Mckay is a 76 y.o. female  who presents to [place] today, 10/28/16,  for chief complaint of:  Chief Complaint  Patient presents with  . Establish Care    RIGHT FOOT PAIN     RENAL History of Chronic kidney disease stage III, following with nephrology. Records reviewed. Some concern for chronic dehydration, multiple abdominal surgeries decrease oral intake.  HEM/ONC History of iron deficiency anemia, following with hematology/oncology records reviewed. Doing well on oral liquid iron with vitamin C. History of DVT/PE, atrophic fibrillation is also on her problem list, anticoagulated on Coumadin, was subtherapeutic at check yesterday and dose was adjusted, will be following up here for INR checks from now on.  MUSCULOSKELETAL/RHEUM History of osteosarcoma on left leg and multiple surgeries/problems due to sequela from this. Left knee pain, history of total arthroplasty, septic joint, recently seen by Dr. Darene Lamer. Patient and her daughter had at that point requested a second opinion from orthopedics but they have not set up an appointment yet. Patient is at this point concerned about infection due to some discomfort in the left lower extremity which was pretty bad yesterday but is better today.     Past medical, surgical, social and family history reviewed: Patient Active Problem List   Diagnosis Date Noted  . Benign hypertension with CKD (chronic kidney disease) stage III 10/28/2016  . History of arthroplasty of left knee 10/25/2016  . Plantar fasciitis, left 10/22/2016  . Fibroma of tongue 06/14/2016  . Postmenopausal osteoporosis 02/06/2016  . History of pulmonary embolism 01/30/2016  . CKD (chronic kidney disease) stage 3, GFR 30-59 ml/min 08/19/2015  . Dry eye syndrome of both lacrimal glands 08/19/2015  . S/P cataract extraction and insertion of intraocular lens 08/19/2015  . Iron deficiency anemia due to chronic blood loss 02/21/2015   . Age-related macular degeneration 02/05/2015  . Congenital malformation of retina 02/05/2015  . Endothelial corneal dystrophy 02/05/2015  . Lumbar spinal stenosis 05/09/2014  . Chronic atrial fibrillation ([place]) 02/26/2014  . S/P colonoscopy 01/23/2014  . S/P endoscopy 01/23/2014  . Gastric bezoar 08/06/2013  . History of MRSA infection 12/30/2012  . Long term current use of anticoagulant therapy 08/23/2011  . Mild intermittent asthma without complication 62/37/6283  . Chest pain 01/15/2010  . Esophageal reflux 11/05/2009  . DDD (degenerative disc disease), lumbar 09/19/2009  . Infection and inflammatory reaction due to internal joint prosthesis ([place]) 08/08/2009  . Vitamin D deficiency 05/12/2009  . Chronic nausea 02/12/2009  . History of DVT of lower extremity 02/12/2009  . Recurrent major depressive disorder, in partial remission ([place]) 02/12/2009   Past Surgical History:  Procedure Laterality Date  . JOINT REPLACEMENT    . STOMACH SURGERY     Social History  Substance Use Topics  . Smoking status: Never Smoker  . Smokeless tobacco: Never Used  . Alcohol use No   History reviewed. No pertinent family history.   Current medication list and allergy/intolerance information reviewed:   Current Outpatient Prescriptions  Medication Sig Dispense Refill  . carvedilol (COREG) 25 MG tablet Take 25 mg by mouth 2 (two) times daily with a meal.    . clindamycin (CLEOCIN) 300 MG capsule Take 300 mg by mouth 3 (three) times daily.    . ergocalciferol (VITAMIN D2) 50000 units capsule Take 50,000 Units by mouth once a week.    . escitalopram (LEXAPRO) 10 MG tablet Take 10 mg by mouth daily.    Marland Kitchen gabapentin (NEURONTIN) 300 MG  capsule 1 tab by mouth daily at bedtime for a week then twice a day for a week then 3 times a day 90 capsule 3  . hydrALAZINE (APRESOLINE) 10 MG tablet Take 10 mg by mouth 3 (three) times daily.    Marland Kitchen HYDROcodone-acetaminophen (NORCO) 7.5-325 MG tablet Take 1 tablet by  mouth every 6 (six) hours as needed for moderate pain.    Marland Kitchen losartan (COZAAR) 100 MG tablet Take 100 mg by mouth daily.    Marland Kitchen topiramate (TOPAMAX) 100 MG tablet Take 100 mg by mouth 2 (two) times daily.    Marland Kitchen warfarin (COUMADIN) 7.5 MG tablet Take 7.5 mg by mouth daily.     No current facility-administered medications for this visit.    Allergies  Allergen Reactions  . Penicillins   . Sulfa Antibiotics       Review of Systems:  Constitutional:  No  fever, no chills, No recent illness, +unintentional weight changes. +significant fatigue.   HEENT: No  headache, no vision change  Cardiac: No  chest pain, No  pressure, No palpitations, No  Orthopnea  Respiratory:  No  shortness of breath. +Cough  Gastrointestinal: No  abdominal pain, +nausea, No  vomiting,  No  blood in stool, No  diarrhea, No  constipation   Musculoskeletal: +myalgia/arthralgia  Genitourinary: No  incontinence, No  abnormal genital bleeding, No abnormal genital discharge  Skin: No  Rash, No other wounds/concerning lesions  Hem/Onc: No  easy bruising/bleeding, No  abnormal lymph node  Endocrine: No cold intolerance,  No heat intolerance. No polyuria/polydipsia/polyphagia   Neurologic: No  weakness, No  dizziness, No  slurred speech/focal weakness/facial droop  Psychiatric: No  concerns with depression, No  concerns with anxiety, No sleep problems, No mood problems  Exam:  BP 135/69   Pulse (!) 57   Ht 5\' 3"  (1.6 m)   Wt 114 lb (51.7 kg)   BMI 20.19 kg/m   Constitutional: VS see above. General Appearance: alert, well-developed, well-nourished, NAD  Eyes: Normal lids and conjunctive, non-icteric sclera  Ears, Nose, Mouth, Throat: MMM, Normal external inspection ears/nares/mouth/lips/gums.   Neck: No masses, trachea midline. No thyroid enlargement.  Respiratory: Normal respiratory effort. no wheeze, no rhonchi, no rales  Cardiovascular: S1/S2 normal, no murmur, no rub/gallop auscultated. RRR. No lower  extremity edema. Neg homans'   Gastrointestinal: Nontender, no masses. No hepatomegaly, no splenomegaly. No hernia appreciated. Bowel sounds normal. Rectal exam deferred.   Musculoskeletal: Gait is not assessed due to patient in wheelchair. No clubbing/cyanosis of digits. Well-healed surgical scars on left knee. Patient moves compression stockings on left side, no significant erythema/edema, patient is concerned about some warmth and redness but there does not appear to be any cellulitic infection  Neurological: Normal balance/coordination. No tremor. No cranial nerve deficit on limited exam. Motor and sensation intact and symmetric. Cerebellar reflexes intact.   Skin: warm, dry, intact. No rash/ulcer. No concerning nevi or subq nodules on limited exam.    Psychiatric: Normal judgment/insight. Normal mood and affect. Oriented x3.    INR 1.3 on 10/27/16   ASSESSMENT/PLAN: Doubt cellulitis or osteomyelitis based on current clinical exam, but of course would need further workup for evaluation of patient's concern of infection. Advised if worse/changed please return to clinic/emergency department, but she really needs to follow-up with orthopedics as recommended by our sports medicine physician here who has already evaluated the knee and foot pain. Also looks like he placed an order for ultrasound of the left lower extremity but this  was not completed all his most recent progress note states that lower extremity Dopplers were negative. On further review, these results are in care everywhere from 10/22/2016.  Pain of left lower extremity - Plan: CBC with Differential/Platelet, Sedimentation rate, High sensitivity CRP  Benign hypertension with CKD (chronic kidney disease) stage III  History of arthroplasty of left knee  Long term current use of anticoagulant therapy  Subtherapeutic international normalized ratio (INR)  Plantar fasciitis, left  CKD (chronic kidney disease) stage 3, GFR 30-59  ml/min  History of DVT of lower extremity  History of MRSA infection  History of pulmonary embolism    Patient Instructions  [place]: [phone] for orthopedic second opinion  I'll also consult with Dr. Darene Lamer if there is anything else he would recommend  Labs today     Visit summary with medication list and pertinent instructions was printed for patient to review. All questions at time of visit were answered - patient instructed to contact office with any additional concerns. ER/RTC precautions were reviewed with the patient. Follow-up plan: Return for coumadin check in 1 week, follow-up with Dr. Sheppard Coil  for annual in the spring/summer .  Note: Total time spent 40 minutes, greater than 50% of the visit was spent face-to-face counseling and coordinating care for the following: The primary encounter diagnosis was Pain of left lower extremity. Diagnoses of Benign hypertension with CKD (chronic kidney disease) stage III, History of arthroplasty of left knee, Long term current use of anticoagulant therapy, Subtherapeutic international normalized ratio (INR), Plantar fasciitis, left, CKD (chronic kidney disease) stage 3, GFR 30-59 ml/min, History of DVT of lower extremity, History of MRSA infection, and History of pulmonary embolism were also pertinent to this visit.Marland Kitchen

## 2016-10-29 LAB — HIGH SENSITIVITY CRP: CRP, High Sensitivity: 1.6 mg/L

## 2016-10-29 LAB — SEDIMENTATION RATE: Sed Rate: 6 mm/hr (ref 0–30)

## 2016-11-04 ENCOUNTER — Ambulatory Visit (INDEPENDENT_AMBULATORY_CARE_PROVIDER_SITE_OTHER): Payer: Medicare Other | Admitting: Osteopathic Medicine

## 2016-11-04 DIAGNOSIS — R791 Abnormal coagulation profile: Secondary | ICD-10-CM | POA: Diagnosis not present

## 2016-11-04 DIAGNOSIS — Z7901 Long term (current) use of anticoagulants: Secondary | ICD-10-CM | POA: Diagnosis not present

## 2016-11-04 LAB — POCT INR
INR: 1.7
INR: 1.7

## 2016-11-05 NOTE — Patient Instructions (Signed)
Patient should start taking 1/2 tablet on Sundays and plan to recheck INR in one week.

## 2016-11-11 DIAGNOSIS — M791 Myalgia: Secondary | ICD-10-CM | POA: Diagnosis not present

## 2016-11-11 DIAGNOSIS — M461 Sacroiliitis, not elsewhere classified: Secondary | ICD-10-CM | POA: Diagnosis not present

## 2016-11-12 ENCOUNTER — Ambulatory Visit: Payer: Medicare Other | Admitting: Sports Medicine

## 2016-11-15 NOTE — Progress Notes (Signed)
Pt informed. Pt expressed understanding and is agreeable. Bobetta Lime CMA, RT

## 2016-11-22 DIAGNOSIS — M5136 Other intervertebral disc degeneration, lumbar region: Secondary | ICD-10-CM | POA: Diagnosis not present

## 2016-11-22 DIAGNOSIS — M48062 Spinal stenosis, lumbar region with neurogenic claudication: Secondary | ICD-10-CM | POA: Diagnosis not present

## 2016-11-22 DIAGNOSIS — M47816 Spondylosis without myelopathy or radiculopathy, lumbar region: Secondary | ICD-10-CM | POA: Diagnosis not present

## 2016-11-22 DIAGNOSIS — M4316 Spondylolisthesis, lumbar region: Secondary | ICD-10-CM | POA: Diagnosis not present

## 2016-11-22 DIAGNOSIS — M48061 Spinal stenosis, lumbar region without neurogenic claudication: Secondary | ICD-10-CM | POA: Diagnosis not present

## 2016-11-24 ENCOUNTER — Ambulatory Visit (INDEPENDENT_AMBULATORY_CARE_PROVIDER_SITE_OTHER): Payer: Medicare Other | Admitting: Osteopathic Medicine

## 2016-11-24 DIAGNOSIS — Z7901 Long term (current) use of anticoagulants: Secondary | ICD-10-CM

## 2016-11-24 LAB — POCT INR: INR: 2.2

## 2016-11-24 NOTE — Progress Notes (Signed)
Pt informed. Pt expressed understanding and is agreeable. Bobetta Lime CMA, RT

## 2016-11-24 NOTE — Progress Notes (Signed)
LVM requesting pt to call the office.  

## 2016-11-30 ENCOUNTER — Encounter: Payer: Self-pay | Admitting: Osteopathic Medicine

## 2016-11-30 ENCOUNTER — Ambulatory Visit (INDEPENDENT_AMBULATORY_CARE_PROVIDER_SITE_OTHER): Payer: Medicare Other | Admitting: Osteopathic Medicine

## 2016-11-30 VITALS — BP 164/74 | HR 58 | Ht 63.0 in | Wt 114.0 lb

## 2016-11-30 DIAGNOSIS — R110 Nausea: Secondary | ICD-10-CM | POA: Diagnosis not present

## 2016-11-30 DIAGNOSIS — G8928 Other chronic postprocedural pain: Secondary | ICD-10-CM

## 2016-11-30 DIAGNOSIS — R350 Frequency of micturition: Secondary | ICD-10-CM

## 2016-11-30 DIAGNOSIS — I10 Essential (primary) hypertension: Secondary | ICD-10-CM

## 2016-11-30 DIAGNOSIS — I482 Chronic atrial fibrillation, unspecified: Secondary | ICD-10-CM

## 2016-11-30 LAB — POCT URINALYSIS DIPSTICK
BILIRUBIN UA: NEGATIVE
Blood, UA: NEGATIVE
GLUCOSE UA: NEGATIVE
Ketones, UA: NEGATIVE
Nitrite, UA: NEGATIVE
Protein, UA: NEGATIVE
SPEC GRAV UA: 1.01 (ref 1.030–1.035)
UROBILINOGEN UA: 0.2 (ref ?–2.0)
pH, UA: 5.5 (ref 5.0–8.0)

## 2016-11-30 MED ORDER — NITROFURANTOIN MONOHYD MACRO 100 MG PO CAPS: 100.0000 mg | ORAL_CAPSULE | Freq: Two times a day (BID) | ORAL | 0 refills | Status: DC

## 2016-11-30 MED ORDER — HYDROCODONE-ACETAMINOPHEN 7.5-325 MG PO TABS: 1.0000 | ORAL_TABLET | Freq: Four times a day (QID) | ORAL | 0 refills | Status: DC | PRN

## 2016-11-30 NOTE — Progress Notes (Signed)
HPI: Gloria Mckay is a 76 y.o. female  who presents to [place] today, 11/30/16,  for chief complaint of:  Chief Complaint  Patient presents with  . Urinary Frequency    Urine concerns . Quality: frequency . Duration: 6 weeks  Associated signs and symptoms: No significant dysuria, no hematuria, no flank pain or abdominal pain. Patient reports occasional nausea.  Chronic pain: Patient is coming due for refill of hydrocodone, requests refill of this today and asks if I can continue this medication  Atrial fibrillation: Anticoagulated on Coumadin. Most recent INR therapeutic.  Essential hypertension: No home blood pressures to report. Blood pressure not at goal today. Patient states due to pain. No chest pain, pressure, shortness of breath.  Past medical history, surgical history, social history and family history reviewed.  Patient Active Problem List   Diagnosis Date Noted  . Benign hypertension with CKD (chronic kidney disease) stage III 10/28/2016  . History of arthroplasty of left knee 10/25/2016  . Plantar fasciitis, left 10/22/2016  . Fibroma of tongue 06/14/2016  . Postmenopausal osteoporosis 02/06/2016  . History of pulmonary embolism 01/30/2016  . CKD (chronic kidney disease) stage 3, GFR 30-59 ml/min 08/19/2015  . Dry eye syndrome of both lacrimal glands 08/19/2015  . S/P cataract extraction and insertion of intraocular lens 08/19/2015  . Iron deficiency anemia due to chronic blood loss 02/21/2015  . Age-related macular degeneration 02/05/2015  . Congenital malformation of retina 02/05/2015  . Endothelial corneal dystrophy 02/05/2015  . Lumbar spinal stenosis 05/09/2014  . Chronic atrial fibrillation ([place]) 02/26/2014  . S/P colonoscopy 01/23/2014  . S/P endoscopy 01/23/2014  . Gastric bezoar 08/06/2013  . History of MRSA infection 12/30/2012  . Long term current use of anticoagulant therapy 08/23/2011  . Mild intermittent asthma  without complication 09/01/7251  . Chest pain 01/15/2010  . Esophageal reflux 11/05/2009  . DDD (degenerative disc disease), lumbar 09/19/2009  . Infection and inflammatory reaction due to internal joint prosthesis ([place]) 08/08/2009  . Vitamin D deficiency 05/12/2009  . Chronic nausea 02/12/2009  . History of DVT of lower extremity 02/12/2009  . Recurrent major depressive disorder, in partial remission ([place]) 02/12/2009    Current medication list and allergy/intolerance information reviewed.   Current Outpatient Prescriptions on File Prior to Visit  Medication Sig Dispense Refill  . carvedilol (COREG) 25 MG tablet Take 25 mg by mouth 2 (two) times daily with a meal.    . clindamycin (CLEOCIN) 300 MG capsule Take 300 mg by mouth 3 (three) times daily.    . ergocalciferol (VITAMIN D2) 50000 units capsule Take 50,000 Units by mouth once a week.    . hydrALAZINE (APRESOLINE) 10 MG tablet Take 10 mg by mouth 3 (three) times daily.    . hydrALAZINE (APRESOLINE) 25 MG tablet Take 25 mg by mouth daily.    Marland Kitchen HYDROcodone-acetaminophen (NORCO) 7.5-325 MG tablet Take 1 tablet by mouth every 6 (six) hours as needed for moderate pain.    Marland Kitchen losartan (COZAAR) 100 MG tablet Take 100 mg by mouth daily.    Marland Kitchen topiramate (TOPAMAX) 100 MG tablet Take 100 mg by mouth 2 (two) times daily.    Marland Kitchen warfarin (COUMADIN) 7.5 MG tablet Take 7.5 mg by mouth daily.     No current facility-administered medications on file prior to visit.     Allergies  Allergen Reactions  . Cefuroxime Anaphylaxis    Difficulty swallowing pills because they were too dry.  Caused tablet dysphagia.  Marland Kitchen  Diazepam Shortness Of Breath    HYPERVENTILATION  HYPERVENTILATION   . Latex Rash and Shortness Of Breath    Airway swelling  . Other Palpitations    Affected heart rate/er told her not to take again Affected heart rate/er told her not to take again  . Amlodipine Cough    Not sure  . Diclofenac Sodium Hives  . Diphenhydramine Hcl  Palpitations    Affected heart rate/er told her not to take again  . Methylpyrrolidone Hives  . Ondansetron Hives and Other (See Comments)    Sedates/knocks her out Sedates/knocks her out  . Oxycodone Hives  . Penicillins Hives and Swelling    Most mycin drugs  . Butorphanol Tartrate Hives  . Lisinopril Cough    Caused pt to cough  . Ramipril Cough    Pt c/o cough  . Sulfa Antibiotics   . Ace Inhibitors Other (See Comments)    Lisinopril and ramipril  . Codeine Nausea Only  . Protonix  [Pantoprazole Sodium] Diarrhea      Review of Systems:  Constitutional: No recent illness  Cardiac: No  chest pain, No  pressure, No palpitations  Respiratory:  No  shortness of breath.   Gastrointestinal: No  abdominal pain, no change in bowel habits  Musculoskeletal: No new myalgia/arthralgia  Skin: No  Rash  Neurologic: No  weakness, No  Dizziness   Exam:  BP (!) 164/74   Pulse (!) 58   Ht 5\' 3"  (1.6 m)   Wt 114 lb (51.7 kg)   BMI 20.19 kg/m   Constitutional: VS see above. General Appearance: alert, well-developed, well-nourished, NAD  Eyes: Normal lids and conjunctive, non-icteric sclera  Ears, Nose, Mouth, Throat: MMM, Normal external inspection ears/nares/mouth/lips/gums.  Neck: No masses, trachea midline.   Respiratory: Normal respiratory effort. no wheeze, no rhonchi, no rales  Cardiovascular: S1/S2 normal, no murmur, no rub/gallop auscultated. RRR.   Musculoskeletal: Gait normal. Symmetric and independent movement of all extremities. Neg [place]  Neurological: Normal balance/coordination. No tremor.  Skin: warm, dry, intact.   Psychiatric: Normal judgment/insight. Normal mood and affect. Oriented x3.    Results for orders placed or performed in visit on 11/30/16 (from the past 72 hour(s))  POCT Urinalysis Dipstick     Status: Abnormal   Collection Time: 11/30/16  3:44 PM  Result Value Ref Range   Color, UA YELLOW    Clarity, UA CLEAR    Glucose, UA  NEGATIVE    Bilirubin, UA NEGATIVE    Ketones, UA NEGATIVE    Spec Grav, UA 1.010 1.030 - 1.035   Blood, UA NEGATIVE    pH, UA 5.5 5.0 - 8.0   Protein, UA NEGATIVE    Urobilinogen, UA 0.2 Negative - 2.0   Nitrite, UA NEGATIVE    Leukocytes, UA small (1+) (A) Negative    ASSESSMENT/PLAN:   Urine frequency - Plan: POCT Urinalysis Dipstick, nitrofurantoin, macrocrystal-monohydrate, (MACROBID) 100 MG capsule, Urine Culture  Chronic nausea  Other chronic postprocedural pain - [place] controlled substance database reviewed and printed his skin. Patient to return to clinic 1 week for further discussion of medications - Plan: HYDROcodone-acetaminophen ([place]) 7.5-325 MG tablet  Chronic atrial fibrillation ([place])  Essential hypertension - recheck 1 week    Patient Instructions  Plan: 1. Antibiotics for UTI 2. Await urine culture - may need to change antibiotics based on results 3. If culture shows no infection, will need to consider treating overactive bladder 4. Please schedule an appointment for 1 week  to recheck INR and discuss pain medication continuation     Follow-up plan: Return in about 1 week (around 12/07/2016) for pain management, INR recheck .  Visit summary with medication list and pertinent instructions was printed for patient to review, alert [place] if any changes needed. All questions at time of visit were answered - patient instructed to contact office with any additional concerns. ER/RTC precautions were reviewed with the patient and understanding verbalized.

## 2016-11-30 NOTE — Patient Instructions (Signed)
Plan: 1. Antibiotics for UTI 2. Await urine culture - may need to change antibiotics based on results 3. If culture shows no infection, will need to consider treating overactive bladder 4. Please schedule an appointment for 1 week to recheck INR and discuss pain medication continuation

## 2016-12-02 ENCOUNTER — Ambulatory Visit (INDEPENDENT_AMBULATORY_CARE_PROVIDER_SITE_OTHER): Payer: Medicare Other | Admitting: Family Medicine

## 2016-12-02 VITALS — BP 166/89 | HR 71

## 2016-12-02 DIAGNOSIS — R350 Frequency of micturition: Secondary | ICD-10-CM

## 2016-12-02 LAB — URINE CULTURE

## 2016-12-02 NOTE — Progress Notes (Signed)
Patient was seen in office today for nurse visit to give urine specimen for culture. Patient reports she feels tired and had a bad headache this morning. She also stated she is worried about her kidney function and would like to look into having this evaluated. She also stated to me she keeps a yeast infection because she is on clindamycin.

## 2016-12-04 LAB — URINE CULTURE: ORGANISM ID, BACTERIA: NO GROWTH

## 2016-12-07 ENCOUNTER — Ambulatory Visit (INDEPENDENT_AMBULATORY_CARE_PROVIDER_SITE_OTHER): Payer: Medicare Other | Admitting: Osteopathic Medicine

## 2016-12-07 VITALS — BP 161/79 | HR 60 | Temp 98.0°F | Resp 16 | Wt 112.0 lb

## 2016-12-07 DIAGNOSIS — I129 Hypertensive chronic kidney disease with stage 1 through stage 4 chronic kidney disease, or unspecified chronic kidney disease: Secondary | ICD-10-CM | POA: Diagnosis not present

## 2016-12-07 DIAGNOSIS — G8928 Other chronic postprocedural pain: Secondary | ICD-10-CM

## 2016-12-07 DIAGNOSIS — N183 Chronic kidney disease, stage 3 (moderate): Secondary | ICD-10-CM | POA: Diagnosis not present

## 2016-12-07 DIAGNOSIS — R350 Frequency of micturition: Secondary | ICD-10-CM

## 2016-12-07 DIAGNOSIS — I482 Chronic atrial fibrillation, unspecified: Secondary | ICD-10-CM

## 2016-12-07 DIAGNOSIS — Z7901 Long term (current) use of anticoagulants: Secondary | ICD-10-CM | POA: Diagnosis not present

## 2016-12-07 LAB — POCT INR: INR: 1.5

## 2016-12-07 MED ORDER — HYDROCODONE-ACETAMINOPHEN 7.5-325 MG PO TABS: 1.0000 | ORAL_TABLET | Freq: Four times a day (QID) | ORAL | 0 refills | Status: DC | PRN

## 2016-12-07 NOTE — Patient Instructions (Addendum)
Plan:  Warfarin 7.5 mg - 1 tablet daily except one day per week take 1 1/2 tablet and plan to recheck INR in one week.   Recheck blood pressure at nurse visit next week. If not better (140/90 or less), may need to consider changing dose of medication .  Plan for visits in the office every 3 months for continuation of controlled substance refills for pain medication, as well as other routine care. Please come see me sooner if needed!

## 2016-12-07 NOTE — Progress Notes (Signed)
HPI: Gloria Mckay is a 76 y.o. female  who presents to [facility] today, 12/07/16,  for chief complaint of:  Chief Complaint  Patient presents with  . Follow-up    pain medications, urinary frequency, INR check    Urinary frequency - stable. Following with Dr Ruthann Cancer - urology - s/p vaginal/bladder reconstruction. Recent UCx negative, no abd pain, no dysuria, no fever.   Chronic pain: controlled substance database reviewed, c/w pt history, no red flags. Complicated orthopedic history w/ lower leg surgeries and infections. Doing well on limited use of opiate pain meds as needed, notices improvement with essential oils.   Afib: INR subtherapeutic at this time. No palpitations.   HTN: elevated today, home BP numbers 120s per patient. No chest pain, no pressure     Past medical history, surgical history, social history and family history reviewed.  Patient Active Problem List   Diagnosis Date Noted  . Benign hypertension with CKD (chronic kidney disease) stage III 10/28/2016  . History of arthroplasty of left knee 10/25/2016  . Plantar fasciitis, left 10/22/2016  . Fibroma of tongue 06/14/2016  . Postmenopausal osteoporosis 02/06/2016  . History of pulmonary embolism 01/30/2016  . CKD (chronic kidney disease) stage 3, GFR 30-59 ml/min 08/19/2015  . Dry eye syndrome of both lacrimal glands 08/19/2015  . S/P cataract extraction and insertion of intraocular lens 08/19/2015  . Iron deficiency anemia due to chronic blood loss 02/21/2015  . Age-related macular degeneration 02/05/2015  . Congenital malformation of retina 02/05/2015  . Endothelial corneal dystrophy 02/05/2015  . Lumbar spinal stenosis 05/09/2014  . Chronic atrial fibrillation ([place]) 02/26/2014  . S/P colonoscopy 01/23/2014  . S/P endoscopy 01/23/2014  . Gastric bezoar 08/06/2013  . History of MRSA infection 12/30/2012  . Long term current use of anticoagulant therapy 08/23/2011  . Mild  intermittent asthma without complication 99/35/7017  . Chest pain 01/15/2010  . Esophageal reflux 11/05/2009  . DDD (degenerative disc disease), lumbar 09/19/2009  . Infection and inflammatory reaction due to internal joint prosthesis ([place]) 08/08/2009  . Vitamin D deficiency 05/12/2009  . Chronic nausea 02/12/2009  . History of DVT of lower extremity 02/12/2009  . Recurrent major depressive disorder, in partial remission ([place]) 02/12/2009    Current medication list and allergy/intolerance information reviewed.   Current Outpatient Prescriptions on File Prior to Visit  Medication Sig Dispense Refill  . carvedilol (COREG) 25 MG tablet Take 25 mg by mouth 2 (two) times daily with a meal.    . clindamycin (CLEOCIN) 300 MG capsule Take 300 mg by mouth 3 (three) times daily.    . ergocalciferol (VITAMIN D2) 50000 units capsule Take 50,000 Units by mouth once a week.    . hydrALAZINE (APRESOLINE) 10 MG tablet Take 10 mg by mouth 3 (three) times daily.    . hydrALAZINE (APRESOLINE) 25 MG tablet Take 25 mg by mouth daily.    Marland Kitchen HYDROcodone-acetaminophen (NORCO) 7.5-325 MG tablet Take 1 tablet by mouth every 6 (six) hours as needed for moderate pain. 30 tablet 0  . losartan (COZAAR) 100 MG tablet Take 100 mg by mouth daily.    . nitrofurantoin, macrocrystal-monohydrate, (MACROBID) 100 MG capsule Take 1 capsule (100 mg total) by mouth 2 (two) times daily. 14 capsule 0  . topiramate (TOPAMAX) 100 MG tablet Take 100 mg by mouth 2 (two) times daily.    Marland Kitchen warfarin (COUMADIN) 7.5 MG tablet Take 7.5 mg by mouth daily.     No current facility-administered  medications on file prior to visit.    Allergies  Allergen Reactions  . Cefuroxime Anaphylaxis    Difficulty swallowing pills because they were too dry.  Caused tablet dysphagia.  . Diazepam Shortness Of Breath    HYPERVENTILATION  HYPERVENTILATION   . Latex Rash and Shortness Of Breath    Airway swelling  . Other Palpitations    Affected heart  rate/er told her not to take again Affected heart rate/er told her not to take again  . Amlodipine Cough    Not sure  . Diclofenac Sodium Hives  . Diphenhydramine Hcl Palpitations    Affected heart rate/er told her not to take again  . Methylpyrrolidone Hives  . Ondansetron Hives and Other (See Comments)    Sedates/knocks her out Sedates/knocks her out  . Oxycodone Hives  . Penicillins Hives and Swelling    Most mycin drugs  . Butorphanol Tartrate Hives  . Lisinopril Cough    Caused pt to cough  . Ramipril Cough    Pt c/o cough  . Sulfa Antibiotics   . Ace Inhibitors Other (See Comments)    Lisinopril and ramipril  . Codeine Nausea Only  . Protonix  [Pantoprazole Sodium] Diarrhea      Review of Systems:  Constitutional: No recent illness  HEENT: No  headache, no vision change  Cardiac: No  chest pain, No  pressure, No palpitations  Respiratory:  No  shortness of breath. No  Cough  Gastrointestinal: No  abdominal pain  Musculoskeletal: No new myalgia/arthralgia  Skin: No  Rash  Neurologic: +generalized weakness, No  Dizziness   Exam:  BP (!) 167/75 (BP Location: Left Arm, Patient Position: Sitting, Cuff Size: Normal)   Pulse 60   Temp 98 F (36.7 C) (Oral)   Resp 16   Wt 112 lb (50.8 kg)   SpO2 99%   BMI 19.84 kg/m   Constitutional: VS see above. General Appearance: alert, well-developed, well-nourished, NAD  Neck: No masses, trachea midline.   Respiratory: Normal respiratory effort.  Musculoskeletal: Gait normal. Symmetric and independent movement of all extremities  Neurological: Normal balance/coordination. No tremor.  Skin: warm, dry, intact.   Psychiatric: Normal judgment/insight. Normal mood and affect. Oriented x3.    Recent Results (from the past 2160 hour(s))  CBC with Differential/Platelet     Status: Abnormal   Collection Time: 10/28/16  2:16 PM  Result Value Ref Range   WBC 4.9 3.8 - 10.8 K/uL   RBC 3.58 (L) 3.80 - 5.10 MIL/uL    Hemoglobin 11.7 11.7 - 15.5 g/dL   HCT 36.0 35.0 - 45.0 %   MCV 100.6 (H) 80.0 - 100.0 fL   MCH 32.7 27.0 - 33.0 pg   MCHC 32.5 32.0 - 36.0 g/dL   RDW 13.5 11.0 - 15.0 %   Platelets 263 140 - 400 K/uL   MPV 9.0 7.5 - 12.5 fL   Neutro Abs 2,989 1,500 - 7,800 cells/uL   Lymphs Abs 1,225 850 - 3,900 cells/uL   Monocytes Absolute 490 200 - 950 cells/uL   Eosinophils Absolute 147 15 - 500 cells/uL   Basophils Absolute 49 0 - 200 cells/uL   Neutrophils Relative % 61 %   Lymphocytes Relative 25 %   Monocytes Relative 10 %   Eosinophils Relative 3 %   Basophils Relative 1 %   Smear Review Criteria for review not met   Sedimentation rate     Status: None   Collection Time: 10/28/16  2:16 PM  Result Value Ref Range   Sed Rate 6 0 - 30 mm/hr  High sensitivity CRP     Status: None   Collection Time: 10/28/16  2:16 PM  Result Value Ref Range   CRP, High Sensitivity 1.6 mg/L    Comment:                                Cardio CRP Cutpoints      For ages > 17 years:          CRP mg/L            Risk         < 1.0      Lower relative cardiovascular risk according to                    AHA/CDC guidelines     1.0 - 3.0      Average relative cardiovascular risk according                    to AHA/CDC guidelines   > 3.0 - 10.0     Higher relative cardiovascular risk according to                    AHA/CDC guidelines.  Consider retesting in 1 to 2                    weeks to exclude a benign transient elevation in                    the baseline CRP value secondary to infection or                    inflammation.         > 10.0     Persistent elevation, upon retesting, may be                    associated with infection and inflammation                    according to AHA/CDC guidelines.   POCT INR     Status: None   Collection Time: 11/04/16  3:10 PM  Result Value Ref Range   INR 1.7   POCT INR     Status: None   Collection Time: 11/04/16  3:10 PM  Result Value Ref Range   INR 1.7    POCT INR     Status: None   Collection Time: 11/24/16 11:08 AM  Result Value Ref Range   INR 2.2   POCT Urinalysis Dipstick     Status: Abnormal   Collection Time: 11/30/16  3:44 PM  Result Value Ref Range   Color, UA YELLOW    Clarity, UA CLEAR    Glucose, UA NEGATIVE    Bilirubin, UA NEGATIVE    Ketones, UA NEGATIVE    Spec Grav, UA 1.010 1.030 - 1.035   Blood, UA NEGATIVE    pH, UA 5.5 5.0 - 8.0   Protein, UA NEGATIVE    Urobilinogen, UA 0.2 Negative - 2.0   Nitrite, UA NEGATIVE    Leukocytes, UA small (1+) (A) Negative  Urine Culture     Status: None   Collection Time: 11/30/16  4:15 PM  Result Value Ref Range   Organism ID, Bacteria      Three or  more organisms present,each greater than 10,000 CFU/mL.These organisms,commonly found on external and internal genitalia,are considered to be colonizers.No further testing performed.   Urine Culture     Status: None   Collection Time: 12/02/16  3:17 PM  Result Value Ref Range   Organism ID, Bacteria NO GROWTH   POCT INR     Status: None   Collection Time: 12/07/16  1:32 PM  Result Value Ref Range   INR 1.5        ASSESSMENT/PLAN:   Other chronic postprocedural pain - [facility] controlled substance database reviewed and printed. Patient to return to clinic q3 mos for continuation of Rx - Plan: HYDROcodone-acetaminophen ([place]) 7.5-325 MG tablet  Chronic atrial fibrillation ([place]) - Plan: POCT INR  Long term current use of anticoagulant therapy  Benign hypertension with CKD (chronic kidney disease) stage III - poorly controlled, pt taking home BP "these numbers are fine," pt asked to bring home BP monitor to next visit  Urine frequency - Cx negative - pt would like to follow up on this with urology - RTC precautions reviewed     Patient Instructions  Plan:  Warfarin 7.5 mg - 1 tablet daily except one day per week take 1 1/2 tablet and plan to recheck INR in one week.   Recheck blood pressure at nurse visit  next week. If not better (140/90 or less), may need to consider changing dose of medication .  Plan for visits in the office every 3 months for continuation of controlled substance refills for pain medication, as well as other routine care. Please come see me sooner if needed!    Follow-up plan: Return in about 1 week (around 12/14/2016) for nurse visit - INR & BP recheck. 3 months w/ Dr. Sheppard Coil for controlled substance refill .  Visit summary with medication list and pertinent instructions was printed for patient to review, alert [place] if any changes needed. All questions at time of visit were answered - patient instructed to contact office with any additional concerns. ER/RTC precautions were reviewed with the patient and understanding verbalized.

## 2016-12-14 ENCOUNTER — Ambulatory Visit (INDEPENDENT_AMBULATORY_CARE_PROVIDER_SITE_OTHER): Payer: Medicare Other | Admitting: Osteopathic Medicine

## 2016-12-14 DIAGNOSIS — I4891 Unspecified atrial fibrillation: Secondary | ICD-10-CM | POA: Diagnosis not present

## 2016-12-14 LAB — POCT INR: INR: 1.8

## 2016-12-15 NOTE — Progress Notes (Signed)
BP (!) 142/71   Pulse (!) 55   Wt 113 lb (51.3 kg)   SpO2 96%   BMI 20.02 kg/m    Results for orders placed or performed in visit on 12/14/16 (from the past 48 hour(s))  POCT INR     Status: None   Collection Time: 12/14/16  1:53 PM  Result Value Ref Range   INR 1.8

## 2016-12-22 DIAGNOSIS — I10 Essential (primary) hypertension: Secondary | ICD-10-CM | POA: Diagnosis not present

## 2016-12-22 DIAGNOSIS — K219 Gastro-esophageal reflux disease without esophagitis: Secondary | ICD-10-CM | POA: Diagnosis not present

## 2016-12-22 DIAGNOSIS — R1012 Left upper quadrant pain: Secondary | ICD-10-CM | POA: Diagnosis not present

## 2016-12-22 DIAGNOSIS — R1314 Dysphagia, pharyngoesophageal phase: Secondary | ICD-10-CM | POA: Diagnosis not present

## 2016-12-24 ENCOUNTER — Ambulatory Visit (INDEPENDENT_AMBULATORY_CARE_PROVIDER_SITE_OTHER): Payer: Medicare Other | Admitting: Osteopathic Medicine

## 2016-12-24 DIAGNOSIS — Z7901 Long term (current) use of anticoagulants: Secondary | ICD-10-CM | POA: Diagnosis not present

## 2016-12-24 LAB — POCT INR: INR: 2.2

## 2016-12-24 NOTE — Patient Instructions (Addendum)
Per PCP pt is to continue taking 1 (7.5 mg) tablet of coumadin daily except for an extra 1/2 (11.25 mg) tablet on Sundays.  Gloria Mckay is to follow up with PCP next week to address hypertension and make appointment in 4 weeks for INR check. -EH/RMA

## 2016-12-24 NOTE — Progress Notes (Signed)
Pt advised to return in 4 weeks for INR check. Pt to return in 1 week for bp check. -EH/RMA

## 2016-12-24 NOTE — Progress Notes (Signed)
Blood pressure elevated on recheck, patient advised to come see me in 1 week for discussion of medications

## 2016-12-29 ENCOUNTER — Ambulatory Visit: Payer: Medicare Other | Admitting: Osteopathic Medicine

## 2016-12-30 ENCOUNTER — Ambulatory Visit (INDEPENDENT_AMBULATORY_CARE_PROVIDER_SITE_OTHER): Payer: Medicare Other | Admitting: Osteopathic Medicine

## 2016-12-30 ENCOUNTER — Encounter: Payer: Self-pay | Admitting: Osteopathic Medicine

## 2016-12-30 VITALS — BP 155/95 | HR 56 | Ht 63.0 in | Wt 115.0 lb

## 2016-12-30 DIAGNOSIS — Z8614 Personal history of Methicillin resistant Staphylococcus aureus infection: Secondary | ICD-10-CM | POA: Diagnosis not present

## 2016-12-30 DIAGNOSIS — I10 Essential (primary) hypertension: Secondary | ICD-10-CM | POA: Diagnosis not present

## 2016-12-30 DIAGNOSIS — M79605 Pain in left leg: Secondary | ICD-10-CM | POA: Diagnosis not present

## 2016-12-30 DIAGNOSIS — I129 Hypertensive chronic kidney disease with stage 1 through stage 4 chronic kidney disease, or unspecified chronic kidney disease: Secondary | ICD-10-CM | POA: Diagnosis not present

## 2016-12-30 DIAGNOSIS — N183 Chronic kidney disease, stage 3 unspecified: Secondary | ICD-10-CM

## 2016-12-30 MED ORDER — HYDROCHLOROTHIAZIDE 12.5 MG PO TABS: 12.5000 mg | ORAL_TABLET | Freq: Every day | ORAL | 1 refills | Status: DC

## 2016-12-30 NOTE — Patient Instructions (Signed)
Plan: 1. Labs today to check kidneys and sodium 2. If labs ok, start low dose blood pressure medicine and come see me next week to recheck pressures and see how you're doing on new medicine.

## 2016-12-30 NOTE — Progress Notes (Signed)
HPI: Gloria Mckay is a 76 y.o. female  who presents to [facility] today, 12/30/16,  for chief complaint of:  Chief Complaint  Patient presents with  . Follow-up    BLOOD PRESSURE    HTN: home BP verified in office, highest at home 341D systolic. BP manual check today 155/95. It never really below 140s at home. History of intolerance to ace inhibitors, patient states she is unable to take amlodipine, not sure the reason. Concerned about this however given history of lower extremity swelling. Patient states slow heart rate, is currently on fairly low dose beta blocker. No chest pain, pressure, shortness of breath at this time however does have episode of left arm pain last week at night, no associated shortness of breath or chest pain   Infectious disease: History of infection of total knee replacement. Following with specialist, antibiotic regimen as below patient states she is currently taking. I don't see notation from infectious disease since 12/02/2014 on record review. Patient states she sees the specialty about once per year. At that point, clindamycin planned to go down to twice a day. Previously on nitrofurantoin for UTI.  Nephrology: Most recent visit 08/19/2016, records reviewed. Hypernatremia, also Kidney disease stage III, hypertensive-related nephrosclerosis, exacerbated by prerenal injury due to chronic dehydration. Most recent CMP reviewed, 10/05/2016: Sodium 148, chloride 119, serum creatinine 1.1, baseline creatinine appears to be 1.1-1.6 over the past year or so. Low protein levels.   Past medical history, surgical history, social history and family history reviewed.  Patient Active Problem List   Diagnosis Date Noted  . Benign hypertension with CKD (chronic kidney disease) stage III 10/28/2016  . History of arthroplasty of left knee 10/25/2016  . Plantar fasciitis, left 10/22/2016  . Fibroma of tongue 06/14/2016  . Postmenopausal  osteoporosis 02/06/2016  . History of pulmonary embolism 01/30/2016  . CKD (chronic kidney disease) stage 3, GFR 30-59 ml/min 08/19/2015  . Dry eye syndrome of both lacrimal glands 08/19/2015  . S/P cataract extraction and insertion of intraocular lens 08/19/2015  . Iron deficiency anemia due to chronic blood loss 02/21/2015  . Age-related macular degeneration 02/05/2015  . Congenital malformation of retina 02/05/2015  . Endothelial corneal dystrophy 02/05/2015  . Lumbar spinal stenosis 05/09/2014  . Chronic atrial fibrillation ([place]) 02/26/2014  . S/P colonoscopy 01/23/2014  . S/P endoscopy 01/23/2014  . Gastric bezoar 08/06/2013  . History of MRSA infection 12/30/2012  . Long term current use of anticoagulant therapy 08/23/2011  . Mild intermittent asthma without complication 62/22/9798  . Chest pain 01/15/2010  . Esophageal reflux 11/05/2009  . DDD (degenerative disc disease), lumbar 09/19/2009  . Infection and inflammatory reaction due to internal joint prosthesis ([place]) 08/08/2009  . Vitamin D deficiency 05/12/2009  . Chronic nausea 02/12/2009  . History of DVT of lower extremity 02/12/2009  . Recurrent major depressive disorder, in partial remission ([place]) 02/12/2009    Current medication list and allergy/intolerance information reviewed.   Current Outpatient Prescriptions on File Prior to Visit  Medication Sig Dispense Refill  . carvedilol (COREG) 25 MG tablet Take 25 mg by mouth 2 (two) times daily with a meal.    . ergocalciferol (VITAMIN D2) 50000 units capsule Take 50,000 Units by mouth once a week.    . hydrALAZINE (APRESOLINE) 25 MG tablet Take 25 mg by mouth 2 (two) times daily.    Marland Kitchen losartan (COZAAR) 100 MG tablet Take 100 mg by mouth daily.    Marland Kitchen topiramate (TOPAMAX) 100  MG tablet Take 100 mg by mouth 2 (two) times daily.    Marland Kitchen warfarin (COUMADIN) 7.5 MG tablet Take 7.5 mg by mouth daily.    . clindamycin (CLEOCIN) 300 MG capsule Take 300 mg by mouth 3 (three) times  daily.    Marland Kitchen HYDROcodone-acetaminophen (NORCO) 7.5-325 MG tablet Take 1 tablet by mouth every 6 (six) hours as needed for moderate pain. #60 for 30(thirty) days (Patient not taking: Reported on 12/30/2016) 60 tablet 0   No current facility-administered medications on file prior to visit.    Allergies  Allergen Reactions  . Cefuroxime Anaphylaxis    Difficulty swallowing pills because they were too dry.  Caused tablet dysphagia.  . Diazepam Shortness Of Breath    HYPERVENTILATION  HYPERVENTILATION   . Latex Rash and Shortness Of Breath    Airway swelling  . Other Palpitations    Affected heart rate/er told her not to take again Affected heart rate/er told her not to take again  . Amlodipine Cough    Not sure  . Diclofenac Sodium Hives  . Diphenhydramine Hcl Palpitations    Affected heart rate/er told her not to take again  . Methylpyrrolidone Hives  . Ondansetron Hives and Other (See Comments)    Sedates/knocks her out Sedates/knocks her out  . Oxycodone Hives  . Penicillins Hives and Swelling    Most mycin drugs  . Butorphanol Tartrate Hives  . Lisinopril Cough    Caused pt to cough  . Ramipril Cough    Pt c/o cough  . Sulfa Antibiotics   . Ace Inhibitors Other (See Comments)    Lisinopril and ramipril  . Codeine Nausea Only  . Protonix  [Pantoprazole Sodium] Diarrhea      Review of Systems:  Constitutional: No recent illness  HEENT: No  headache, no vision change  Cardiac: No  chest pain, No  pressure, No palpitations  Respiratory:  No  shortness of breath. No  Cough  Musculoskeletal: No new myalgia/arthralgia  Skin: No  Rash  Neurologic: No  weakness, No  Dizziness   Exam:  BP (!) 176/82   Pulse (!) 56   Ht 5\' 3"  (1.6 m)   Wt 115 lb (52.2 kg)   BMI 20.37 kg/m   Constitutional: VS see above. General Appearance: alert, well-developed, well-nourished, NAD  Eyes: Normal lids and conjunctive, non-icteric sclera  Ears, Nose, Mouth, Throat: MMM, Normal  external inspection ears/nares/mouth/lips/gums.  Neck: No masses, trachea midline.   Respiratory: Normal respiratory effort. no wheeze, no rhonchi, no rales  Cardiovascular: S1/S2 normal, no murmur, no rub/gallop auscultated. RRR.   Musculoskeletal: Gait normal. Symmetric and independent movement of all extremities  Neurological: Normal balance/coordination. No tremor.  Skin: warm, dry, intact.   Psychiatric: Normal judgment/insight. Normal mood and affect. Oriented x3.     No results found.  No flowsheet data found.  Depression screen [facility] 2/9 12/30/2016  Decreased Interest 0  Down, Depressed, Hopeless 0  PHQ - 2 Score 0    EKG: Rate 57. Sinus bradycardia and T-wave inversions and Q waves in inferior leads were present in old EKGs based on record review 12/29/2015, 11/21/2015, 05/23/2015. No ST/T changes to indicate acute ischemia/infarction.  ASSESSMENT/PLAN:   Essential hypertension - Maximum dose losartan, okay to continue hydralazine, wouldn't increase beta blocker. Cautious addition of thiazide diuretic, may need to follow-up with nephro - Plan: hydrochlorothiazide (HYDRODIURIL) 12.5 MG tablet, BASIC METABOLIC PANEL WITH GFR  Pain of left lower extremity - We'll leave antibiotics on list  for now, will address with patient at future visit, need to revisit with infectious disease  Benign hypertension with CKD (chronic kidney disease) stage III  History of MRSA infection    Patient Instructions  Plan: 1. Labs today to check kidneys and sodium 2. If labs ok, start low dose blood pressure medicine and come see me next week to recheck pressures and see how you're doing on new medicine.     Follow-up plan: Return for recheck BP early next week..  Visit summary with medication list and pertinent instructions was printed for patient to review, alert [place] if any changes needed. All questions at time of visit were answered - patient instructed to contact office with any  additional concerns. ER/RTC precautions were reviewed with the patient and understanding verbalized.

## 2016-12-31 LAB — BASIC METABOLIC PANEL WITH GFR
BUN: 27 mg/dL — AB (ref 7–25)
CO2: 20 mmol/L (ref 20–31)
Calcium: 8.4 mg/dL — ABNORMAL LOW (ref 8.6–10.4)
Chloride: 119 mmol/L — ABNORMAL HIGH (ref 98–110)
Creat: 1.5 mg/dL — ABNORMAL HIGH (ref 0.60–0.93)
GFR, EST NON AFRICAN AMERICAN: 34 mL/min — AB (ref >=60)
GFR, Est African American: 39 mL/min — ABNORMAL LOW (ref >=60)
GLUCOSE: 84 mg/dL (ref 65–99)
POTASSIUM: 3.9 mmol/L (ref 3.5–5.3)
Sodium: 147 mmol/L — ABNORMAL HIGH (ref 135–146)

## 2016-12-31 MED ORDER — AMLODIPINE BESYLATE 10 MG PO TABS: 10.0000 mg | ORAL_TABLET | Freq: Every day | ORAL | 0 refills | Status: DC

## 2016-12-31 NOTE — Addendum Note (Signed)
Addended by: Bo Mcclintock C on: 12/31/2016 11:09 AM   Modules accepted: Orders

## 2017-01-04 NOTE — Addendum Note (Signed)
Addended by: Huel Cote on: 01/04/2017 09:35 AM   Modules accepted: Orders

## 2017-01-05 ENCOUNTER — Ambulatory Visit (INDEPENDENT_AMBULATORY_CARE_PROVIDER_SITE_OTHER): Payer: Medicare Other | Admitting: Osteopathic Medicine

## 2017-01-05 ENCOUNTER — Encounter: Payer: Self-pay | Admitting: Osteopathic Medicine

## 2017-01-05 VITALS — BP 135/75 | HR 65 | Ht 63.0 in | Wt 118.0 lb

## 2017-01-05 DIAGNOSIS — I129 Hypertensive chronic kidney disease with stage 1 through stage 4 chronic kidney disease, or unspecified chronic kidney disease: Secondary | ICD-10-CM

## 2017-01-05 DIAGNOSIS — N183 Chronic kidney disease, stage 3 unspecified: Secondary | ICD-10-CM

## 2017-01-05 DIAGNOSIS — Z5181 Encounter for therapeutic drug level monitoring: Secondary | ICD-10-CM

## 2017-01-05 DIAGNOSIS — Z7901 Long term (current) use of anticoagulants: Secondary | ICD-10-CM

## 2017-01-05 LAB — COMPLETE METABOLIC PANEL WITH GFR
ALT: 23 U/L (ref 6–29)
AST: 20 U/L (ref 10–35)
Albumin: 3.9 g/dL (ref 3.6–5.1)
Alkaline Phosphatase: 72 U/L (ref 33–130)
BILIRUBIN TOTAL: 0.4 mg/dL (ref 0.2–1.2)
BUN: 26 mg/dL — ABNORMAL HIGH (ref 7–25)
CO2: 19 mmol/L — AB (ref 20–31)
Calcium: 8 mg/dL — ABNORMAL LOW (ref 8.6–10.4)
Chloride: 117 mmol/L — ABNORMAL HIGH (ref 98–110)
Creat: 1.44 mg/dL — ABNORMAL HIGH (ref 0.60–0.93)
GFR, EST AFRICAN AMERICAN: 41 mL/min — AB (ref >=60)
GFR, Est Non African American: 36 mL/min — ABNORMAL LOW (ref >=60)
Glucose, Bld: 85 mg/dL (ref 65–99)
Potassium: 4.1 mmol/L (ref 3.5–5.3)
Sodium: 145 mmol/L (ref 135–146)
Total Protein: 5.9 g/dL — ABNORMAL LOW (ref 6.1–8.1)

## 2017-01-05 LAB — PROTIME-INR
INR: 1.2 — AB
PROTHROMBIN TIME: 12.8 s — AB (ref 9.0–11.5)

## 2017-01-05 NOTE — Progress Notes (Signed)
HPI: Gloria Mckay is a 76 y.o. female  who presents to [facility] today, 01/05/17,  for chief complaint of:  Chief Complaint  Patient presents with  . Follow-up    blood pressure    HTN: home BP cuff verified in office few weeks ago - Home readings diastolics typically in 88C to 16S, systolic occasionally up into 160s, no chest pain, pressure, shortness of breath. Last visit we started HCTZ but renal function wasn't looking good so we switched this to Amlodipine, BP looking better now.   Nephrology: Most recent visit 08/19/2016, records reviewed. Hypernatremia, also Kidney disease stage III, hypertensive-related nephrosclerosis, exacerbated by prerenal injury due to chronic dehydration. Most recent CMP reviewed, 10/05/2016: Sodium 148, chloride 119, serum creatinine 1.1, baseline creatinine appears to be 1.1-1.6 over the past year or so. Low protein levels. 12/30/2016: Sodium 147, Chloride 119, Creatinine 1.5, GFR 34, Calcium low but no albumin for correction.   GI is planning for endoscopic procedure, patient is off Coumadin, states the GI once INR to be 1.2 or less, we'll go ahead and check this today, patient isn't sure if they have a process for verifying INR prior to initiation of procedure   Past medical history, surgical history, social history and family history reviewed.  Patient Active Problem List   Diagnosis Date Noted  . Benign hypertension with CKD (chronic kidney disease) stage III 10/28/2016  . History of arthroplasty of left knee 10/25/2016  . Plantar fasciitis, left 10/22/2016  . Fibroma of tongue 06/14/2016  . Postmenopausal osteoporosis 02/06/2016  . History of pulmonary embolism 01/30/2016  . CKD (chronic kidney disease) stage 3, GFR 30-59 ml/min 08/19/2015  . Dry eye syndrome of both lacrimal glands 08/19/2015  . S/P cataract extraction and insertion of intraocular lens 08/19/2015  . Iron deficiency anemia due to chronic blood loss  02/21/2015  . Age-related macular degeneration 02/05/2015  . Congenital malformation of retina 02/05/2015  . Endothelial corneal dystrophy 02/05/2015  . Lumbar spinal stenosis 05/09/2014  . Chronic atrial fibrillation ([place]) 02/26/2014  . S/P colonoscopy 01/23/2014  . S/P endoscopy 01/23/2014  . Gastric bezoar 08/06/2013  . History of MRSA infection 12/30/2012  . Long term current use of anticoagulant therapy 08/23/2011  . Mild intermittent asthma without complication 02/27/1600  . Chest pain 01/15/2010  . Esophageal reflux 11/05/2009  . DDD (degenerative disc disease), lumbar 09/19/2009  . Infection and inflammatory reaction due to internal joint prosthesis ([place]) 08/08/2009  . Vitamin D deficiency 05/12/2009  . Chronic nausea 02/12/2009  . History of DVT of lower extremity 02/12/2009  . Recurrent major depressive disorder, in partial remission ([place]) 02/12/2009    Current medication list and allergy/intolerance information reviewed.   Current Outpatient Prescriptions on File Prior to Visit  Medication Sig Dispense Refill  . amLODipine (NORVASC) 10 MG tablet Take 1 tablet (10 mg total) by mouth daily. 30 tablet 0  . carvedilol (COREG) 25 MG tablet Take 25 mg by mouth 2 (two) times daily with a meal.    . clindamycin (CLEOCIN) 300 MG capsule Take 300 mg by mouth 3 (three) times daily.    . ergocalciferol (VITAMIN D2) 50000 units capsule Take 50,000 Units by mouth once a week.    . hydrALAZINE (APRESOLINE) 25 MG tablet Take 25 mg by mouth 2 (two) times daily.    . hydrochlorothiazide (HYDRODIURIL) 12.5 MG tablet Take 1 tablet (12.5 mg total) by mouth daily. 30 tablet 1  . HYDROcodone-acetaminophen (NORCO) 7.5-325 MG tablet  Take 1 tablet by mouth every 6 (six) hours as needed for moderate pain. #60 for 30(thirty) days 60 tablet 0  . losartan (COZAAR) 100 MG tablet Take 100 mg by mouth daily.    Marland Kitchen topiramate (TOPAMAX) 100 MG tablet Take 100 mg by mouth 2 (two) times daily.    Marland Kitchen warfarin  (COUMADIN) 7.5 MG tablet Take 7.5 mg by mouth daily.     No current facility-administered medications on file prior to visit.    Allergies  Allergen Reactions  . Cefuroxime Anaphylaxis    Difficulty swallowing pills because they were too dry.  Caused tablet dysphagia.  . Diazepam Shortness Of Breath    HYPERVENTILATION  HYPERVENTILATION   . Latex Rash and Shortness Of Breath    Airway swelling  . Other Palpitations    Affected heart rate/er told her not to take again Affected heart rate/er told her not to take again  . Amlodipine Cough    Not sure  . Diclofenac Sodium Hives  . Diphenhydramine Hcl Palpitations    Affected heart rate/er told her not to take again  . Methylpyrrolidone Hives  . Ondansetron Hives and Other (See Comments)    Sedates/knocks her out Sedates/knocks her out  . Oxycodone Hives  . Penicillins Hives and Swelling    Most mycin drugs  . Butorphanol Tartrate Hives  . Lisinopril Cough    Caused pt to cough  . Ramipril Cough    Pt c/o cough  . Sulfa Antibiotics   . Ace Inhibitors Other (See Comments)    Lisinopril and ramipril  . Codeine Nausea Only  . Protonix  [Pantoprazole Sodium] Diarrhea      Review of Systems:  Constitutional: Feels well today  HEENT: No  headache, no vision change  Cardiac: No  chest pain, No  pressure, No palpitations  Respiratory:  No  shortness of breath.  Musculoskeletal: No new myalgia/arthralgia  Skin: No  Rash  Neurologic: No  weakness, No  Dizziness   Exam:  BP 135/75   Pulse 65   Ht 5\' 3"  (1.6 m)   Wt 118 lb (53.5 kg)   BMI 20.90 kg/m   Constitutional: VS see above. General Appearance: alert, well-developed, well-nourished, NAD  Eyes: Normal lids and conjunctive, non-icteric sclera  Ears, Nose, Mouth, Throat: MMM, Normal external inspection ears/nares/mouth/lips/gums.  Neck: No masses, trachea midline.   Respiratory: Normal respiratory effort. no wheeze, no rhonchi, no rales  Cardiovascular:  S1/S2 normal, no murmur  Neurological: Normal balance/coordination. No tremor.  Skin: warm, dry, intact.   Psychiatric: Normal judgment/insight. Normal mood and affect. Oriented x3.     ASSESSMENT/PLAN: Continue current antihypertensive regimen pending any further recommendations from nephrology, patient has appointment with them next month. Manual blood pressure check today shows blood pressure is under good control. Advised patient that elevated numbers on her home monitor acquire recheck after 5 minutes rest.   Benign hypertension with CKD (chronic kidney disease) stage III - Recheck labs today, continue current medications, following with nephrology - Plan: COMPLETE METABOLIC PANEL WITH GFR  Anticoagulated on Coumadin - Advised contact GI office to see what they would recommend if INR is not at goal, did they have a method of checking it in the office - Plan: INR/PT      Follow-up plan: Return in about 4 months (around 05/08/2017) for follow up BP, sooner if needed . Pending labs, pending specialist recommendations  Visit summary with medication list and pertinent instructions was printed for patient to review,  alert [place] if any changes needed. All questions at time of visit were answered - patient instructed to contact office with any additional concerns. ER/RTC precautions were reviewed with the patient and understanding verbalized.

## 2017-01-07 DIAGNOSIS — K219 Gastro-esophageal reflux disease without esophagitis: Secondary | ICD-10-CM | POA: Diagnosis not present

## 2017-01-07 DIAGNOSIS — R1310 Dysphagia, unspecified: Secondary | ICD-10-CM | POA: Diagnosis not present

## 2017-01-07 DIAGNOSIS — K222 Esophageal obstruction: Secondary | ICD-10-CM | POA: Diagnosis not present

## 2017-01-07 DIAGNOSIS — R110 Nausea: Secondary | ICD-10-CM | POA: Diagnosis not present

## 2017-01-07 DIAGNOSIS — K449 Diaphragmatic hernia without obstruction or gangrene: Secondary | ICD-10-CM | POA: Diagnosis not present

## 2017-01-11 DIAGNOSIS — D509 Iron deficiency anemia, unspecified: Secondary | ICD-10-CM | POA: Diagnosis not present

## 2017-01-11 DIAGNOSIS — I82409 Acute embolism and thrombosis of unspecified deep veins of unspecified lower extremity: Secondary | ICD-10-CM | POA: Diagnosis not present

## 2017-01-11 DIAGNOSIS — R220 Localized swelling, mass and lump, head: Secondary | ICD-10-CM | POA: Diagnosis not present

## 2017-01-11 DIAGNOSIS — I482 Chronic atrial fibrillation: Secondary | ICD-10-CM | POA: Diagnosis not present

## 2017-01-11 DIAGNOSIS — R718 Other abnormality of red blood cells: Secondary | ICD-10-CM | POA: Insufficient documentation

## 2017-01-11 DIAGNOSIS — Z903 Acquired absence of stomach [part of]: Secondary | ICD-10-CM | POA: Diagnosis not present

## 2017-01-11 DIAGNOSIS — K9089 Other intestinal malabsorption: Secondary | ICD-10-CM | POA: Diagnosis not present

## 2017-01-11 DIAGNOSIS — I2699 Other pulmonary embolism without acute cor pulmonale: Secondary | ICD-10-CM | POA: Diagnosis not present

## 2017-01-11 DIAGNOSIS — Z7901 Long term (current) use of anticoagulants: Secondary | ICD-10-CM | POA: Diagnosis not present

## 2017-01-11 DIAGNOSIS — D500 Iron deficiency anemia secondary to blood loss (chronic): Secondary | ICD-10-CM | POA: Diagnosis not present

## 2017-01-11 DIAGNOSIS — R748 Abnormal levels of other serum enzymes: Secondary | ICD-10-CM | POA: Diagnosis not present

## 2017-01-11 DIAGNOSIS — Z86718 Personal history of other venous thrombosis and embolism: Secondary | ICD-10-CM | POA: Diagnosis not present

## 2017-01-11 DIAGNOSIS — R490 Dysphonia: Secondary | ICD-10-CM | POA: Diagnosis not present

## 2017-01-11 DIAGNOSIS — Z5181 Encounter for therapeutic drug level monitoring: Secondary | ICD-10-CM | POA: Diagnosis not present

## 2017-01-11 NOTE — Addendum Note (Signed)
Addended by: Maryla Morrow on: 01/11/2017 10:24 AM   Modules accepted: Orders

## 2017-01-18 ENCOUNTER — Telehealth: Payer: Self-pay

## 2017-01-18 DIAGNOSIS — G8928 Other chronic postprocedural pain: Secondary | ICD-10-CM

## 2017-01-18 MED ORDER — HYDROCODONE-ACETAMINOPHEN 7.5-325 MG PO TABS: 1.0000 | ORAL_TABLET | Freq: Four times a day (QID) | ORAL | 0 refills | Status: DC | PRN

## 2017-01-18 MED ORDER — ERGOCALCIFEROL 1.25 MG (50000 UT) PO CAPS: 50000.0000 [IU] | ORAL_CAPSULE | ORAL | 2 refills | Status: DC

## 2017-01-18 NOTE — Telephone Encounter (Signed)
Vitamin D 7, IC were I have ordered the vitamin D levels to be repeated but the patient has not gotten this done, can we remind her to come to the lab.  Okay to refill the pain medication, her last fill of this was one month ago. Would avoid using it on a daily basis unless absolutely needed, based on my last discussion with the patient she was not using it very often, if this has changed she needs to let me know.

## 2017-01-18 NOTE — Telephone Encounter (Signed)
Gloria Mckay called and would like a refill on Vitamin D, historical. She takes it once a week. She has been out for 2 weeks. She is going to come in on Friday for her lab work. She will also need a refill on hydrocodone. Please advise.

## 2017-01-19 NOTE — Telephone Encounter (Signed)
Patient is aware and did pick up medications.

## 2017-01-21 ENCOUNTER — Ambulatory Visit (INDEPENDENT_AMBULATORY_CARE_PROVIDER_SITE_OTHER): Payer: Medicare Other | Admitting: Osteopathic Medicine

## 2017-01-21 DIAGNOSIS — I129 Hypertensive chronic kidney disease with stage 1 through stage 4 chronic kidney disease, or unspecified chronic kidney disease: Secondary | ICD-10-CM | POA: Diagnosis not present

## 2017-01-21 DIAGNOSIS — N183 Chronic kidney disease, stage 3 (moderate): Secondary | ICD-10-CM | POA: Diagnosis not present

## 2017-01-21 DIAGNOSIS — Z5181 Encounter for therapeutic drug level monitoring: Secondary | ICD-10-CM

## 2017-01-21 LAB — POCT INR: INR: 1.7

## 2017-01-21 MED ORDER — WARFARIN SODIUM 7.5 MG PO TABS: ORAL_TABLET | ORAL | 0 refills | Status: DC

## 2017-01-21 NOTE — Progress Notes (Signed)
See anticoag track.

## 2017-01-21 NOTE — Progress Notes (Signed)
Notified pt of dosage change, Sun, Tues, Thurs to 1.5 pills 11.25 mg, and 1 pill 7.5 mg the other 4 days.  Pt verbalized understanding.  Appointment in 1 week for repeat INR.

## 2017-01-22 LAB — CALCIUM, IONIZED: CALCIUM ION: 5 mg/dL (ref 4.8–5.6)

## 2017-01-22 LAB — MAGNESIUM: Magnesium: 1.8 mg/dL (ref 1.5–2.5)

## 2017-01-22 LAB — VITAMIN D 25 HYDROXY (VIT D DEFICIENCY, FRACTURES): Vit D, 25-Hydroxy: 40 ng/mL (ref 30–100)

## 2017-01-22 LAB — PHOSPHORUS: Phosphorus: 4.2 mg/dL (ref 2.1–4.3)

## 2017-01-25 LAB — PARATHYROID HORMONE, INTACT (NO CA): PTH: 106 pg/mL — ABNORMAL HIGH (ref 14–64)

## 2017-01-26 ENCOUNTER — Telehealth: Payer: Self-pay

## 2017-01-26 MED ORDER — WARFARIN SODIUM 7.5 MG PO TABS: ORAL_TABLET | ORAL | 0 refills | Status: DC

## 2017-01-26 NOTE — Telephone Encounter (Signed)
Pt is requesting a refill of warfarin 7.5 sent to [facility].  You have not filled this before.  Please advise.

## 2017-01-26 NOTE — Telephone Encounter (Signed)
Patient has been advised. Gloria Mckay,CMA  

## 2017-01-26 NOTE — Telephone Encounter (Signed)
Ok to refill 

## 2017-01-26 NOTE — Telephone Encounter (Signed)
Sent to [facility]

## 2017-02-04 ENCOUNTER — Ambulatory Visit (INDEPENDENT_AMBULATORY_CARE_PROVIDER_SITE_OTHER): Payer: Medicare Other | Admitting: Osteopathic Medicine

## 2017-02-04 DIAGNOSIS — Z5181 Encounter for therapeutic drug level monitoring: Secondary | ICD-10-CM | POA: Diagnosis not present

## 2017-02-04 LAB — POCT INR: INR: 1.6

## 2017-02-04 NOTE — Progress Notes (Signed)
Pt advised of dosage increase. She has an OV with PCP, can recheck then.

## 2017-02-08 ENCOUNTER — Ambulatory Visit (INDEPENDENT_AMBULATORY_CARE_PROVIDER_SITE_OTHER): Payer: Medicare Other | Admitting: Osteopathic Medicine

## 2017-02-08 ENCOUNTER — Encounter: Payer: Self-pay | Admitting: Osteopathic Medicine

## 2017-02-08 ENCOUNTER — Ambulatory Visit (INDEPENDENT_AMBULATORY_CARE_PROVIDER_SITE_OTHER): Payer: Medicare Other

## 2017-02-08 VITALS — BP 163/85 | HR 72 | Wt 113.0 lb

## 2017-02-08 DIAGNOSIS — M25512 Pain in left shoulder: Secondary | ICD-10-CM

## 2017-02-08 DIAGNOSIS — I129 Hypertensive chronic kidney disease with stage 1 through stage 4 chronic kidney disease, or unspecified chronic kidney disease: Secondary | ICD-10-CM

## 2017-02-08 DIAGNOSIS — N183 Chronic kidney disease, stage 3 (moderate): Secondary | ICD-10-CM

## 2017-02-08 DIAGNOSIS — M5136 Other intervertebral disc degeneration, lumbar region: Secondary | ICD-10-CM | POA: Diagnosis not present

## 2017-02-08 NOTE — Patient Instructions (Addendum)
Plan:  Xray today of shoulder and follow up on shoulder pain with Dr. Darene Lamer within the next week or so  Keep medications same  Neurosurgery: can call whoever Dr. Beather Arbour recommended or I would advise follow-up with Dr. Darene Lamer for further discussion of who he would recommend  Physical therapy: let us know if you decide you'd like to pursue physical therapy here at the [facility].   Blood pressure: 140/90 or less is our goal, if higher than that, please come see me.

## 2017-02-08 NOTE — Progress Notes (Signed)
HPI: Gloria Mckay is a 76 y.o. female  who presents to [facility] today, 02/08/17,  for chief complaint of:  Chief Complaint  Patient presents with  . Back Pain     Back pain - previously following with orthopedics - reviewed MRI results 10/2016. Low back pain has been worse since fall, was trying to catch her husband from falling, she fell on top of him, landed on her pelvis and L shoulder. Pain now in lower back and mid-thoracicAs well as left shoulder. When she was here for last INR check, nurse advised she follow-up with me for this issue, I don't think they were aware that she was seeing an orthopedist for these problems. Patient states that her orthopedist referred her to neurosurgeon due to abnormalities on lumbar spine MRI, concern for radicular symptoms upper extremity on the left. Patient has not made appointment with the neurosurgeon to him she was referred, she has some concerns about location of this person, traffic leaving her retirement community. At this point, her goal is to avoid altering medications, does not want to pursue surgery, does not want to pursue epidural injections since these results for blood pressure, is thinking about "implantable TENS unit" I think she means nerve stimulator, does not want to pursue physical therapy here at this point  HTN: home BP cuff measuring Typically in the 130s to 140s, verified at previous visit. No chest pain, pressure, shortness of breath. Patient states she thinks pain/stress are increasing blood pressure in the office.   Past medical history, surgical history, social history and family history reviewed.  Patient Active Problem List   Diagnosis Date Noted  . Benign hypertension with CKD (chronic kidney disease) stage III 10/28/2016  . History of arthroplasty of left knee 10/25/2016  . Plantar fasciitis, left 10/22/2016  . Fibroma of tongue 06/14/2016  . Postmenopausal osteoporosis 02/06/2016  .  History of pulmonary embolism 01/30/2016  . CKD (chronic kidney disease) stage 3, GFR 30-59 ml/min 08/19/2015  . Dry eye syndrome of both lacrimal glands 08/19/2015  . S/P cataract extraction and insertion of intraocular lens 08/19/2015  . Iron deficiency anemia due to chronic blood loss 02/21/2015  . Age-related macular degeneration 02/05/2015  . Congenital malformation of retina 02/05/2015  . Endothelial corneal dystrophy 02/05/2015  . Lumbar spinal stenosis 05/09/2014  . Chronic atrial fibrillation ([place]) 02/26/2014  . S/P colonoscopy 01/23/2014  . S/P endoscopy 01/23/2014  . Gastric bezoar 08/06/2013  . History of MRSA infection 12/30/2012  . Long term current use of anticoagulant therapy 08/23/2011  . Mild intermittent asthma without complication 41/93/7902  . Chest pain 01/15/2010  . Esophageal reflux 11/05/2009  . DDD (degenerative disc disease), lumbar 09/19/2009  . Infection and inflammatory reaction due to internal joint prosthesis ([place]) 08/08/2009  . Vitamin D deficiency 05/12/2009  . Chronic nausea 02/12/2009  . History of DVT of lower extremity 02/12/2009  . Recurrent major depressive disorder, in partial remission ([place]) 02/12/2009    Current medication list and allergy/intolerance information reviewed.   Current Outpatient Prescriptions on File Prior to Visit  Medication Sig Dispense Refill  . amLODipine (NORVASC) 10 MG tablet Take 1 tablet (10 mg total) by mouth daily. 30 tablet 0  . carvedilol (COREG) 25 MG tablet Take 25 mg by mouth 2 (two) times daily with a meal.    . clindamycin (CLEOCIN) 300 MG capsule Take 300 mg by mouth 3 (three) times daily.    . ergocalciferol (VITAMIN D2) 50000 units  capsule Take 1 capsule (50,000 Units total) by mouth once a week. 8 capsule 2  . hydrALAZINE (APRESOLINE) 25 MG tablet Take 25 mg by mouth 2 (two) times daily.    . hydrochlorothiazide (HYDRODIURIL) 12.5 MG tablet Take 1 tablet (12.5 mg total) by mouth daily. 30 tablet 1  .  HYDROcodone-acetaminophen (NORCO) 7.5-325 MG tablet Take 1 tablet by mouth every 6 (six) hours as needed for moderate pain. #60 for 30(thirty) days 60 tablet 0  . losartan (COZAAR) 100 MG tablet Take 100 mg by mouth daily.    Marland Kitchen topiramate (TOPAMAX) 100 MG tablet Take 100 mg by mouth 2 (two) times daily.    Marland Kitchen warfarin (COUMADIN) 7.5 MG tablet Take 1.5 tablets by mouth daily every Sun, Tues, Thurs and take 1 tablet by mouth daily every Mon, Wed, Fri, Sat 90 tablet 0   No current facility-administered medications on file prior to visit.        Review of Systems:  Constitutional: No recent illness  HEENT: No  headache, no vision change  Cardiac: No  chest pain  Respiratory:  No  shortness of breath. No  Cough  Musculoskeletal: + chronic myalgia/arthralgia  Skin: No  Rash  Neurologic:No  Dizziness   Exam:  BP (!) 163/85   Pulse 72   Wt 113 lb (51.3 kg)   BMI 20.02 kg/m   Constitutional: VS see above. General Appearance: alert, well-developed, well-nourished, NAD  Eyes: Normal lids and conjunctive, non-icteric sclera  Ears, Nose, Mouth, Throat: MMM, Normal external inspection ears/nares/mouth/lips/gums.  Neck: No masses, trachea midline.   Respiratory: Normal respiratory effort. no wheeze, no rhonchi, no rales  Cardiovascular: S1/S2 normal, no murmur, no rub/gallop auscultated. RRR.   Musculoskeletal: Gait normal. Symmetric and independent movement of all extremities. L shoulder limited range of motion to abduction and flextion/extension, no effusion/crepitus, neg drop arm test.   Neurological: Normal balance/coordination. No tremor.  Skin: warm, dry, intact.   Psychiatric: Normal judgment/insight. Normal mood and affect. Oriented x3.      No results found.  No flowsheet data found.  Depression screen PHQ 2/9 12/30/2016  Decreased Interest 0  Down, Depressed, Hopeless 0  PHQ - 2 Score 0   MRI Lumbar Spine Wo Contrast3/26/2018 [facility] Result Impression   IMPRESSION:   1. Multilevel degenerative spondylosis, greatest at L3-4. Specifically, DDD at L3-4, with a moderate circumferential disc bulge. Mild to moderate bilateral facet arthropathy, worse on the left. Mild anterolisthesis of L3 on L4. Also slight leftward  lateral subluxation of L3 on L4. Moderately severe left subarticular zone stenosis, with impingement on the descending left L4 nerve root. Moderate central zone and right subarticular zone stenosis. These findings have progressed mildly since 12/04/2013,  notably the severity of the left subarticular zone stenosis.  2. Please see report above for additional details.      ASSESSMENT/PLAN:   Lacking clear goals for therapy. Sounds like patient really needs to follow-up with neurosurgeon to discuss options regarding abnormal MRI lumbar spine, cervical radicular symptoms. Also advised follow-up with sports medicine for further evaluation of shoulder, though she is resistant to any injections at this point and declined physical therapy. I have advised her that from a primary care standpoint, if she does not wish to pursue physical therapy, injections, or alteration of medication regimen and there is not much more I can offer her at this point and advise follow-up with sports med/neurosurgery  Blood pressure elevated in the office, patient states home blood pressure cuff  is reading less than 140s over 90s. Patient advised on appropriate range at home blood pressure and alert me if anything is leading to high/2 low.  Left shoulder pain, unspecified chronicity - Advise follow-up with sports medicine, we'll get x-ray today - Plan: DG Shoulder Left  DDD (degenerative disc disease), lumbar - Advise follow-up with neurosurgery, patient can make her own appointment or consult with sports medicine regarding who they recommend  Benign hypertension with CKD (chronic kidney disease) stage III    Patient Instructions  Plan:  Xray today of shoulder  and follow up on shoulder pain with Dr. Darene Lamer within the next week or so  Keep medications same  Neurosurgery: can call whoever Dr. Beather Arbour recommended or I would advise follow-up with Dr. Darene Lamer for further discussion of who he would recommend  Physical therapy: let us know if you decide you'd like to pursue physical therapy here at the [facility].   Blood pressure: 140/90 or less is our goal, if higher than that, please come see me.     Follow-up plan: Return in about 3 days (around 02/11/2017) for INR check, recheck shoulder and back issues with Dr. Darene Lamer.  Visit summary with medication list and pertinent instructions was printed for patient to review, alert [place] if any changes needed. All questions at time of visit were answered - patient instructed to contact office with any additional concerns. ER/RTC precautions were reviewed with the patient and understanding verbalized.   Note: Total time spent 25 minutes, greater than 50% of the visit was spent face-to-face counseling and coordinating care for the following: The primary encounter diagnosis was Left shoulder pain, unspecified chronicity. A diagnosis of DDD (degenerative disc disease), lumbar was also pertinent to this visit.Marland Kitchen

## 2017-02-11 ENCOUNTER — Ambulatory Visit (INDEPENDENT_AMBULATORY_CARE_PROVIDER_SITE_OTHER): Payer: Medicare Other | Admitting: Sports Medicine

## 2017-02-11 ENCOUNTER — Encounter: Payer: Self-pay | Admitting: Sports Medicine

## 2017-02-11 ENCOUNTER — Ambulatory Visit: Payer: Medicare Other

## 2017-02-11 VITALS — BP 146/71 | HR 57

## 2017-02-11 DIAGNOSIS — Z96652 Presence of left artificial knee joint: Secondary | ICD-10-CM

## 2017-02-11 DIAGNOSIS — I481 Persistent atrial fibrillation: Secondary | ICD-10-CM

## 2017-02-11 DIAGNOSIS — M48061 Spinal stenosis, lumbar region without neurogenic claudication: Secondary | ICD-10-CM | POA: Diagnosis not present

## 2017-02-11 DIAGNOSIS — M5030 Other cervical disc degeneration, unspecified cervical region: Secondary | ICD-10-CM

## 2017-02-11 DIAGNOSIS — I4819 Other persistent atrial fibrillation: Secondary | ICD-10-CM

## 2017-02-11 LAB — POCT INR: INR: 3.3

## 2017-02-11 MED ORDER — AMBULATORY NON FORMULARY MEDICATION: 0 refills | Status: DC

## 2017-02-11 MED ORDER — DULOXETINE HCL 30 MG PO CPEP: 30.0000 mg | ORAL_CAPSULE | Freq: Every day | ORAL | 3 refills | Status: DC

## 2017-02-11 NOTE — Assessment & Plan Note (Signed)
Multilevel lumbar stenosis with facet arthritis worst at L3-L4. Patient desires more conservative approach. She is agreeable to proceed with a TENS unit, Cymbalta. She does have some other surgeries on the schedule, her eye, and tongue and would like these out of the way before we get aggressive with her lumbar spine.

## 2017-02-11 NOTE — Assessment & Plan Note (Signed)
With periscapular radicular pain. TENS unit, Cymbalta. Again she would like to get past her other surgeries before proceeding with anything for her neck or back.

## 2017-02-11 NOTE — Progress Notes (Signed)
   Subjective:    I'm seeing this patient as a consultation for:  Dr. Emeterio Reeve  CC: Left leg pain, back pain, neck pain  HPI: This is a 76 year old female, she is all most 2 decades post left total knee arthroplasty, starting to have some pain. Understand she needs to follow this up with her or speak surgeon.  Neck pain: Radiates between the shoulder blades, nothing overtly radicular down the arms. Has never had this evaluated. Moderate, persistent.  Low back pain: MRI showed multiple degenerative processes, has been resistant to physical therapy, injections, medication. Currently she does want to try usual care but has I surgery coming up as well as a surgery on her tong, and would like to get these out of the way and we'll put her back on the back burner for now. She does get some radicular pain.  Past medical history:  Negative.  See flowsheet/record as well for more information.  Surgical history: Negative.  See flowsheet/record as well for more information.  Family history: Negative.  See flowsheet/record as well for more information.  Social history: Negative.  See flowsheet/record as well for more information.  Allergies, and medications have been entered into the medical record, reviewed, and no changes needed.   Review of Systems: No headache, visual changes, nausea, vomiting, diarrhea, constipation, dizziness, abdominal pain, skin rash, fevers, chills, night sweats, weight loss, swollen lymph nodes, body aches, joint swelling, muscle aches, chest pain, shortness of breath, mood changes, visual or auditory hallucinations.   Objective:   General: Well Developed, well nourished, and in no acute distress.  Neuro/Psych: Alert and oriented x3, extra-ocular muscles intact, able to move all 4 extremities, sensation grossly intact. Skin: Warm and dry, no rashes noted.  Respiratory: Not using accessory muscles, speaking in full sentences, trachea midline.  Cardiovascular: Pulses  palpable, no extremity edema. Abdomen: Does not appear distended.  Impression and Recommendations:   This case required medical decision making of moderate complexity.  Lumbar spinal stenosis Multilevel lumbar stenosis with facet arthritis worst at L3-L4. Patient desires more conservative approach. She is agreeable to proceed with a TENS unit, Cymbalta. She does have some other surgeries on the schedule, her eye, and tongue and would like these out of the way before we get aggressive with her lumbar spine.  DDD (degenerative disc disease), cervical With periscapular radicular pain. TENS unit, Cymbalta. Again she would like to get past her other surgeries before proceeding with anything for her neck or back.  History of arthroplasty of left knee Persistent pain. Arthroplasty is almost 2 decades old. She will return to her surgeon Dr. Beather Arbour for further advice.

## 2017-02-11 NOTE — Assessment & Plan Note (Signed)
Persistent pain. Arthroplasty is almost 2 decades old. She will return to her surgeon Dr. Beather Arbour for further advice.

## 2017-02-14 DIAGNOSIS — H04123 Dry eye syndrome of bilateral lacrimal glands: Secondary | ICD-10-CM | POA: Diagnosis not present

## 2017-02-14 DIAGNOSIS — Z961 Presence of intraocular lens: Secondary | ICD-10-CM | POA: Diagnosis not present

## 2017-02-14 DIAGNOSIS — H26493 Other secondary cataract, bilateral: Secondary | ICD-10-CM | POA: Diagnosis not present

## 2017-02-15 ENCOUNTER — Other Ambulatory Visit: Payer: Self-pay | Admitting: Osteopathic Medicine

## 2017-02-15 DIAGNOSIS — G8928 Other chronic postprocedural pain: Secondary | ICD-10-CM

## 2017-02-15 MED ORDER — HYDROCODONE-ACETAMINOPHEN 7.5-325 MG PO TABS: 1.0000 | ORAL_TABLET | Freq: Four times a day (QID) | ORAL | 0 refills | Status: DC | PRN

## 2017-02-16 NOTE — Telephone Encounter (Signed)
Pt advised Rx is ready for pick up and it is dated for 02/18/17. Verbalized understanding.

## 2017-02-17 DIAGNOSIS — H353122 Nonexudative age-related macular degeneration, left eye, intermediate dry stage: Secondary | ICD-10-CM | POA: Diagnosis not present

## 2017-02-17 DIAGNOSIS — H353111 Nonexudative age-related macular degeneration, right eye, early dry stage: Secondary | ICD-10-CM | POA: Diagnosis not present

## 2017-02-21 DIAGNOSIS — I10 Essential (primary) hypertension: Secondary | ICD-10-CM | POA: Diagnosis not present

## 2017-02-21 DIAGNOSIS — D500 Iron deficiency anemia secondary to blood loss (chronic): Secondary | ICD-10-CM | POA: Diagnosis not present

## 2017-02-21 DIAGNOSIS — N183 Chronic kidney disease, stage 3 (moderate): Secondary | ICD-10-CM | POA: Diagnosis not present

## 2017-02-21 DIAGNOSIS — I129 Hypertensive chronic kidney disease with stage 1 through stage 4 chronic kidney disease, or unspecified chronic kidney disease: Secondary | ICD-10-CM | POA: Diagnosis not present

## 2017-02-24 ENCOUNTER — Ambulatory Visit (INDEPENDENT_AMBULATORY_CARE_PROVIDER_SITE_OTHER): Payer: Medicare Other | Admitting: Osteopathic Medicine

## 2017-02-24 DIAGNOSIS — Z7901 Long term (current) use of anticoagulants: Secondary | ICD-10-CM

## 2017-02-24 DIAGNOSIS — I824Y9 Acute embolism and thrombosis of unspecified deep veins of unspecified proximal lower extremity: Secondary | ICD-10-CM | POA: Diagnosis not present

## 2017-02-24 DIAGNOSIS — I4891 Unspecified atrial fibrillation: Secondary | ICD-10-CM

## 2017-02-24 DIAGNOSIS — D6859 Other primary thrombophilia: Secondary | ICD-10-CM | POA: Diagnosis not present

## 2017-02-24 LAB — PROTIME-INR
INR: 3.8 — ABNORMAL HIGH
Prothrombin Time: 38.5 s — ABNORMAL HIGH (ref 9.0–11.5)

## 2017-02-24 LAB — POCT INR: INR: >4.5

## 2017-02-24 NOTE — Progress Notes (Signed)
Hold Coumadin tonight and will call in the AM once we have serum results.   02/25/2017: Results for orders placed or performed in visit on 02/24/17 (from the past 48 hour(s))  INR/PT     Status: Abnormal   Collection Time: 02/24/17  1:41 PM  Result Value Ref Range   Prothrombin Time 38.5 (H) 9.0 - 11.5 sec    Comment:   For more information on this test, go to: http://education.[facility].com/faq/FAQ104      INR 3.8 (H)     Comment:   Reference Range                        0.9-1.1 Moderate-intensity Warfarin Therapy    2.0-3.0 Higher-intensity Warfarin Therapy      3.0-4.0     POCT INR     Status: None   Collection Time: 02/24/17  1:45 PM  Result Value Ref Range   INR >4.5    INR 2 weeks ago was 3.3, doesn't look like this was addressed at the time.  INR above goal, reduce dose and close followup  Labile INR, question compliance

## 2017-02-25 NOTE — Progress Notes (Signed)
Patient advised of results and scheduled for follow up.

## 2017-02-25 NOTE — Progress Notes (Signed)
Left a message for a return call.

## 2017-02-26 ENCOUNTER — Other Ambulatory Visit: Payer: Self-pay | Admitting: Osteopathic Medicine

## 2017-03-03 DIAGNOSIS — H26493 Other secondary cataract, bilateral: Secondary | ICD-10-CM | POA: Diagnosis not present

## 2017-03-04 ENCOUNTER — Ambulatory Visit (INDEPENDENT_AMBULATORY_CARE_PROVIDER_SITE_OTHER): Payer: Medicare Other | Admitting: Sports Medicine

## 2017-03-04 DIAGNOSIS — I4891 Unspecified atrial fibrillation: Secondary | ICD-10-CM

## 2017-03-04 DIAGNOSIS — D6859 Other primary thrombophilia: Secondary | ICD-10-CM

## 2017-03-04 DIAGNOSIS — Z7901 Long term (current) use of anticoagulants: Secondary | ICD-10-CM

## 2017-03-04 DIAGNOSIS — I824Y9 Acute embolism and thrombosis of unspecified deep veins of unspecified proximal lower extremity: Secondary | ICD-10-CM

## 2017-03-04 LAB — POCT INR: INR: 2.8

## 2017-03-04 NOTE — Progress Notes (Signed)
   Continue current dose, 1 tab daily except for 1-1/2 tabs on Tuesday and Thursday. Recheck in 4 weeks.

## 2017-03-07 ENCOUNTER — Telehealth: Payer: Self-pay | Admitting: *Deleted

## 2017-03-07 NOTE — Telephone Encounter (Signed)
Patient notified and voiced understanding.

## 2017-03-07 NOTE — Telephone Encounter (Signed)
-----   Message from Silverio Decamp, MD sent at 03/04/2017  5:56 PM EDT ----- Continue current dose, 1 tab daily except for 1-1/2 tabs on Tuesday and Thursday. Recheck in 4 weeks.

## 2017-03-14 ENCOUNTER — Ambulatory Visit: Payer: Medicare Other | Admitting: Osteopathic Medicine

## 2017-03-17 ENCOUNTER — Encounter: Payer: Self-pay | Admitting: Osteopathic Medicine

## 2017-03-17 ENCOUNTER — Ambulatory Visit (INDEPENDENT_AMBULATORY_CARE_PROVIDER_SITE_OTHER): Payer: Medicare Other | Admitting: Osteopathic Medicine

## 2017-03-17 VITALS — BP 127/60 | HR 54 | Wt 111.0 lb

## 2017-03-17 DIAGNOSIS — M5030 Other cervical disc degeneration, unspecified cervical region: Secondary | ICD-10-CM

## 2017-03-17 DIAGNOSIS — M51369 Other intervertebral disc degeneration, lumbar region without mention of lumbar back pain or lower extremity pain: Secondary | ICD-10-CM

## 2017-03-17 DIAGNOSIS — N3946 Mixed incontinence: Secondary | ICD-10-CM | POA: Diagnosis not present

## 2017-03-17 DIAGNOSIS — M48061 Spinal stenosis, lumbar region without neurogenic claudication: Secondary | ICD-10-CM

## 2017-03-17 DIAGNOSIS — G8928 Other chronic postprocedural pain: Secondary | ICD-10-CM | POA: Diagnosis not present

## 2017-03-17 DIAGNOSIS — M5136 Other intervertebral disc degeneration, lumbar region: Secondary | ICD-10-CM | POA: Diagnosis not present

## 2017-03-17 MED ORDER — HYDROCODONE-ACETAMINOPHEN 7.5-325 MG PO TABS: 1.0000 | ORAL_TABLET | Freq: Four times a day (QID) | ORAL | 0 refills | Status: DC | PRN

## 2017-03-17 NOTE — Progress Notes (Signed)
HPI: Gloria Mckay is a 76 y.o. female  who presents to [facility] today, 03/17/17,  for chief complaint of:  Chief Complaint  Patient presents with  . pain managment    Patient is here for routine 3 month follow-up for maintenance of opioid pain medications. No new musculoskeletal complaints today.  Reports some issues with urinary leakage getting worse over the past few months to the point where she is having to go to bed in diapers, leaking with stress incontinence. Has had some kind of bladder sling/Reconstruction in the past several years ago. No dysuria or pelvic/lower abdominal pain.    Past medical history, surgical history, social history and family history reviewed.  Patient Active Problem List   Diagnosis Date Noted  . Long term (current) use of anticoagulants [Z79.01] 02/24/2017  . Primary hypercoagulable state ([place]) [D68.59] 02/24/2017  . Atrial fibrillation ([place]) [I48.91] 02/24/2017  . Acute venous embolism and thrombosis of deep vessels of proximal lower extremity ([place]) [I82.4Y9] 02/24/2017  . DDD (degenerative disc disease), cervical 02/11/2017  . Benign hypertension with CKD (chronic kidney disease) stage III 10/28/2016  . History of arthroplasty of left knee 10/25/2016  . Plantar fasciitis, left 10/22/2016  . Fibroma of tongue 06/14/2016  . Postmenopausal osteoporosis 02/06/2016  . History of pulmonary embolism 01/30/2016  . CKD (chronic kidney disease) stage 3, GFR 30-59 ml/min 08/19/2015  . Dry eye syndrome of both lacrimal glands 08/19/2015  . S/P cataract extraction and insertion of intraocular lens 08/19/2015  . Iron deficiency anemia due to chronic blood loss 02/21/2015  . Age-related macular degeneration 02/05/2015  . Congenital malformation of retina 02/05/2015  . Endothelial corneal dystrophy 02/05/2015  . Lumbar spinal stenosis 05/09/2014  . Chronic atrial fibrillation ([place]) 02/26/2014  . S/P colonoscopy 01/23/2014  .  S/P endoscopy 01/23/2014  . Gastric bezoar 08/06/2013  . History of MRSA infection 12/30/2012  . Long term current use of anticoagulant therapy 08/23/2011  . Mild intermittent asthma without complication 93/26/7124  . Chest pain 01/15/2010  . Esophageal reflux 11/05/2009  . DDD (degenerative disc disease), lumbar 09/19/2009  . Infection and inflammatory reaction due to internal joint prosthesis ([place]) 08/08/2009  . Vitamin D deficiency 05/12/2009  . Chronic nausea 02/12/2009  . History of DVT of lower extremity 02/12/2009  . Recurrent major depressive disorder, in partial remission ([place]) 02/12/2009   Past Surgical History:  Procedure Laterality Date  . JOINT REPLACEMENT    . STOMACH SURGERY      Current medication list and allergy/intolerance information reviewed.   Current Outpatient Prescriptions on File Prior to Visit  Medication Sig Dispense Refill  . AMBULATORY NON FORMULARY MEDICATION Home TENS unit, please use up to TID for 30 mins, this is medically necessary. 1 each 0  . amLODipine (NORVASC) 10 MG tablet TAKE 1 TABLET BY MOUTH EVERY DAY 30 tablet 0  . carvedilol (COREG) 25 MG tablet Take 25 mg by mouth 2 (two) times daily with a meal.    . clindamycin (CLEOCIN) 300 MG capsule Take 300 mg by mouth 2 (two) times daily.     . DULoxetine (CYMBALTA) 30 MG capsule Take 1 capsule (30 mg total) by mouth daily. 30 capsule 3  . ergocalciferol (VITAMIN D2) 50000 units capsule Take 1 capsule (50,000 Units total) by mouth once a week. 8 capsule 2  . hydrALAZINE (APRESOLINE) 25 MG tablet Take 25 mg by mouth 2 (two) times daily.    . hydrochlorothiazide (HYDRODIURIL) 12.5 MG tablet Take  1 tablet (12.5 mg total) by mouth daily. 30 tablet 1  . HYDROcodone-acetaminophen (NORCO) 7.5-325 MG tablet Take 1 tablet by mouth every 6 (six) hours as needed for moderate pain. #60 for 30(thirty) days 60 tablet 0  . losartan (COZAAR) 100 MG tablet Take 100 mg by mouth daily.    Marland Kitchen topiramate (TOPAMAX) 100  MG tablet Take 100 mg by mouth 2 (two) times daily.    Marland Kitchen warfarin (COUMADIN) 7.5 MG tablet Take 1.5 tablets by mouth daily every Sun, Tues, Thurs and take 1 tablet by mouth daily every Mon, Wed, Fri, Sat 90 tablet 0   No current facility-administered medications on file prior to visit.    Allergies  Allergen Reactions  . Cefuroxime Anaphylaxis    Difficulty swallowing pills because they were too dry.  Caused tablet dysphagia.  . Diazepam Shortness Of Breath    HYPERVENTILATION  HYPERVENTILATION   . Latex Rash and Shortness Of Breath    Airway swelling  . Other Palpitations    Affected heart rate/er told her not to take again Affected heart rate/er told her not to take again  . Amlodipine Cough    Not sure  . Diclofenac Sodium Hives  . Diphenhydramine Hcl Palpitations    Affected heart rate/er told her not to take again  . Methylpyrrolidone Hives  . Ondansetron Hives and Other (See Comments)    Sedates/knocks her out Sedates/knocks her out  . Oxycodone Hives  . Penicillins Hives and Swelling    Most mycin drugs  . Butorphanol Tartrate Hives  . Ciprofloxacin     Patient prefers not to take Fluoroquinolones  . Lisinopril Cough    Caused pt to cough  . Ramipril Cough    Pt c/o cough  . Sulfa Antibiotics   . Ace Inhibitors Other (See Comments)    Lisinopril and ramipril  . Codeine Nausea Only  . Protonix  [Pantoprazole Sodium] Diarrhea      Review of Systems:  Constitutional: No recent illness  Cardiac: No  chest pain  Respiratory:  No  shortness of breath.   Gastrointestinal: No  abdominal pain  Musculoskeletal: No new myalgia/arthralgia  Skin: No  Rash  Neurologic: No  weakness, No  Dizziness   Exam:  BP 127/60   Pulse (!) 54   Wt 111 lb (50.3 kg)   SpO2 98%   BMI 19.66 kg/m   Constitutional: VS see above. General Appearance: alert, well-developed, well-nourished, NAD  Eyes: Normal lids and conjunctive, non-icteric sclera  Ears, Nose, Mouth,  Throat: MMM, Normal external inspection ears/nares/mouth/lips/gums.  Neck: No masses, trachea midline.   Respiratory: Normal respiratory effort. no wheeze, no rhonchi, no rales  Cardiovascular: S1/S2 normal, no murmur, no rub/gallop auscultated. RRR.   Musculoskeletal: Gait normal. Symmetric and independent movement of all extremities  Neurological: Normal balance/coordination. No tremor.  Skin: warm, dry, intact.   Psychiatric: Normal judgment/insight. Normal mood and affect. Oriented x3.    ASSESSMENT/PLAN:   3 post-dated prescriptions given to the patint.  controlled substance database reviewed and no concerns.   DDD (degenerative disc disease), cervical  DDD (degenerative disc disease), lumbar  Spinal stenosis of lumbar region without neurogenic claudication  Other chronic postprocedural pain - [place] controlled substance database reviewed and consistent with history. Patient to return to clinic q3 mos for continuation of Rx - Plan: HYDROcodone-acetaminophen (NORCO) 7.5-325 MG tablet, DISCONTINUED: HYDROcodone-acetaminophen (NORCO) 7.5-325 MG tablet, DISCONTINUED: HYDROcodone-acetaminophen (NORCO) 7.5-325 MG tablet  Mixed stress and urge urinary incontinence - Plan:  Ambulatory referral to Urology      Follow-up plan: Return in about 3 months (around 06/17/2017) for follow-up pain medications and blood pressure .  Visit summary with medication list and pertinent instructions was printed for patient to review, alert [place] if any changes needed. All questions at time of visit were answered - patient instructed to contact office with any additional concerns. ER/RTC precautions were reviewed with the patient and understanding verbalized.

## 2017-03-24 DIAGNOSIS — H26493 Other secondary cataract, bilateral: Secondary | ICD-10-CM | POA: Diagnosis not present

## 2017-03-25 ENCOUNTER — Ambulatory Visit (INDEPENDENT_AMBULATORY_CARE_PROVIDER_SITE_OTHER): Payer: Medicare Other | Admitting: Sports Medicine

## 2017-03-25 DIAGNOSIS — D6859 Other primary thrombophilia: Secondary | ICD-10-CM | POA: Diagnosis not present

## 2017-03-25 DIAGNOSIS — Z7901 Long term (current) use of anticoagulants: Secondary | ICD-10-CM

## 2017-03-25 DIAGNOSIS — I4891 Unspecified atrial fibrillation: Secondary | ICD-10-CM

## 2017-03-25 DIAGNOSIS — I824Y9 Acute embolism and thrombosis of unspecified deep veins of unspecified proximal lower extremity: Secondary | ICD-10-CM | POA: Diagnosis not present

## 2017-03-25 LAB — POCT INR: INR: 3.6

## 2017-03-25 NOTE — Progress Notes (Signed)
Pt is here for a coumadin check.  Denies changes in meds, diet, and skipped does. Pt comfirmed taking 11.25 mg on wednesdays, thursdays and 7.5 mg all other days. Will route for review. -EH/RMA

## 2017-03-25 NOTE — Progress Notes (Signed)
Pt notified -EH/RMA  

## 2017-04-06 DIAGNOSIS — J069 Acute upper respiratory infection, unspecified: Secondary | ICD-10-CM | POA: Diagnosis not present

## 2017-04-06 DIAGNOSIS — J311 Chronic nasopharyngitis: Secondary | ICD-10-CM | POA: Diagnosis not present

## 2017-04-06 DIAGNOSIS — Q825 Congenital non-neoplastic nevus: Secondary | ICD-10-CM | POA: Diagnosis not present

## 2017-04-06 DIAGNOSIS — H6121 Impacted cerumen, right ear: Secondary | ICD-10-CM | POA: Diagnosis not present

## 2017-04-07 ENCOUNTER — Ambulatory Visit (INDEPENDENT_AMBULATORY_CARE_PROVIDER_SITE_OTHER): Payer: Medicare Other | Admitting: Osteopathic Medicine

## 2017-04-07 DIAGNOSIS — Z7901 Long term (current) use of anticoagulants: Secondary | ICD-10-CM | POA: Diagnosis not present

## 2017-04-07 DIAGNOSIS — D6859 Other primary thrombophilia: Secondary | ICD-10-CM

## 2017-04-07 DIAGNOSIS — I4891 Unspecified atrial fibrillation: Secondary | ICD-10-CM

## 2017-04-07 DIAGNOSIS — I824Y9 Acute embolism and thrombosis of unspecified deep veins of unspecified proximal lower extremity: Secondary | ICD-10-CM

## 2017-04-07 LAB — POCT INR: INR: 2.1

## 2017-04-07 NOTE — Progress Notes (Signed)
Given labile levels RTC 2 weeks to be safe

## 2017-04-08 NOTE — Progress Notes (Signed)
Pt.notified

## 2017-04-12 DIAGNOSIS — R32 Unspecified urinary incontinence: Secondary | ICD-10-CM | POA: Diagnosis not present

## 2017-04-12 DIAGNOSIS — I10 Essential (primary) hypertension: Secondary | ICD-10-CM | POA: Diagnosis not present

## 2017-04-21 ENCOUNTER — Encounter: Payer: Self-pay | Admitting: Osteopathic Medicine

## 2017-04-21 ENCOUNTER — Ambulatory Visit (INDEPENDENT_AMBULATORY_CARE_PROVIDER_SITE_OTHER): Payer: Medicare Other

## 2017-04-21 ENCOUNTER — Ambulatory Visit (INDEPENDENT_AMBULATORY_CARE_PROVIDER_SITE_OTHER): Payer: Medicare Other | Admitting: Osteopathic Medicine

## 2017-04-21 ENCOUNTER — Ambulatory Visit: Payer: Medicare Other

## 2017-04-21 VITALS — BP 128/74 | HR 65 | Wt 113.0 lb

## 2017-04-21 DIAGNOSIS — I129 Hypertensive chronic kidney disease with stage 1 through stage 4 chronic kidney disease, or unspecified chronic kidney disease: Secondary | ICD-10-CM

## 2017-04-21 DIAGNOSIS — Z23 Encounter for immunization: Secondary | ICD-10-CM | POA: Diagnosis not present

## 2017-04-21 DIAGNOSIS — R05 Cough: Secondary | ICD-10-CM | POA: Diagnosis not present

## 2017-04-21 DIAGNOSIS — Z7901 Long term (current) use of anticoagulants: Secondary | ICD-10-CM

## 2017-04-21 DIAGNOSIS — R0989 Other specified symptoms and signs involving the circulatory and respiratory systems: Secondary | ICD-10-CM

## 2017-04-21 DIAGNOSIS — R059 Cough, unspecified: Secondary | ICD-10-CM

## 2017-04-21 DIAGNOSIS — N183 Chronic kidney disease, stage 3 unspecified: Secondary | ICD-10-CM

## 2017-04-21 DIAGNOSIS — I4891 Unspecified atrial fibrillation: Secondary | ICD-10-CM | POA: Diagnosis not present

## 2017-04-21 LAB — POCT INR: INR: 2.8

## 2017-04-21 NOTE — Patient Instructions (Signed)
Plan:  I'll request records from ENT and see what I can figure out  I'd encourage you to contact them about any questions as they are the ones primarily managing your sinus/throat/ear issues. I'm happy to help in any way I can  Let's see each other again in one month to follow-up on this. See me sooner if needed

## 2017-04-21 NOTE — Progress Notes (Signed)
HPI: Gloria Mckay is a 76 y.o. female  who presents to [facility] today, 04/21/17,  for chief complaint of:  Chief Complaint  Patient presents with  . Follow-up    Recent nephrology notes reviewed 02/21/2017: Recommended avoiding scheduled diuretic medications as these may be causing some dehydration.  ENT: Recently seen at [facility], Dr. Evern Bio. Extensive discussion about her experience there. Patient states that she has had some difficulty arranging appointment there due to office urging with another practice, she was very frustrated with them. I do not currently have any records available but the patient has a lot of questions about what bacteria she is being treated for. They have placed her on tobramycin nasal rinses, she is worried about an infection going into her vocal cords or lungs. According to the patient, sounds like a office laryngoscopy was performed that did not reveal anything concerning on visualization of the vocal cords.  Was having some sharp left-sided chest pain with deep breaths a few weeks ago and was worried about PNA, no fever, some dry coughing. No chest pressure or palpitations, no orthopnea, no lower extremity swelling, no dizziness. Pain seems to have resolved at this point.    Past medical, surgical, social and family history reviewed: Patient Active Problem List   Diagnosis Date Noted  . Long term (current) use of anticoagulants [Z79.01] 02/24/2017  . Primary hypercoagulable state ([place]) [D68.59] 02/24/2017  . Atrial fibrillation ([place]) [I48.91] 02/24/2017  . Acute venous embolism and thrombosis of deep vessels of proximal lower extremity ([place]) [I82.4Y9] 02/24/2017  . DDD (degenerative disc disease), cervical 02/11/2017  . Benign hypertension with CKD (chronic kidney disease) stage III 10/28/2016  . History of arthroplasty of left knee 10/25/2016  . Plantar fasciitis, left 10/22/2016  . Fibroma of tongue 06/14/2016  .  Postmenopausal osteoporosis 02/06/2016  . History of pulmonary embolism 01/30/2016  . CKD (chronic kidney disease) stage 3, GFR 30-59 ml/min 08/19/2015  . Dry eye syndrome of both lacrimal glands 08/19/2015  . S/P cataract extraction and insertion of intraocular lens 08/19/2015  . Iron deficiency anemia due to chronic blood loss 02/21/2015  . Age-related macular degeneration 02/05/2015  . Congenital malformation of retina 02/05/2015  . Endothelial corneal dystrophy 02/05/2015  . Lumbar spinal stenosis 05/09/2014  . Chronic atrial fibrillation ([place]) 02/26/2014  . S/P colonoscopy 01/23/2014  . S/P endoscopy 01/23/2014  . Gastric bezoar 08/06/2013  . History of MRSA infection 12/30/2012  . Long term current use of anticoagulant therapy 08/23/2011  . Mild intermittent asthma without complication 56/21/3086  . Chest pain 01/15/2010  . Esophageal reflux 11/05/2009  . DDD (degenerative disc disease), lumbar 09/19/2009  . Infection and inflammatory reaction due to internal joint prosthesis ([place]) 08/08/2009  . Vitamin D deficiency 05/12/2009  . Chronic nausea 02/12/2009  . History of DVT of lower extremity 02/12/2009  . Recurrent major depressive disorder, in partial remission ([place]) 02/12/2009   Past Surgical History:  Procedure Laterality Date  . JOINT REPLACEMENT    . STOMACH SURGERY     Social History  Substance Use Topics  . Smoking status: Never Smoker  . Smokeless tobacco: Never Used  . Alcohol use No   No family history on file.   Current medication list and allergy/intolerance information reviewed:   Current Outpatient Prescriptions  Medication Sig Dispense Refill  . AMBULATORY NON FORMULARY MEDICATION Home TENS unit, please use up to TID for 30 mins, this is medically necessary. 1 each 0  . carvedilol (  COREG) 25 MG tablet Take 25 mg by mouth 2 (two) times daily with a meal.    . clindamycin (CLEOCIN) 300 MG capsule Take 300 mg by mouth 2 (two) times daily.     .  ergocalciferol (VITAMIN D2) 50000 units capsule Take 1 capsule (50,000 Units total) by mouth once a week. 8 capsule 2  . hydrALAZINE (APRESOLINE) 25 MG tablet Take 25 mg by mouth 2 (two) times daily.    . hydrochlorothiazide (HYDRODIURIL) 12.5 MG tablet Take 1 tablet (12.5 mg total) by mouth daily. 30 tablet 1  . [START ON 05/16/2017] HYDROcodone-acetaminophen (NORCO) 7.5-325 MG tablet Take 1 tablet by mouth every 6 (six) hours as needed for moderate pain. #60 for 30(thirty) days 60 tablet 0  . hydrOXYzine (ATARAX/VISTARIL) 10 MG tablet Take 10-20 mg by mouth 3 (three) times daily as needed for anxiety.    . mometasone (ELOCON) 0.1 % cream Apply 1 application topically daily.    . mupirocin ointment (BACTROBAN) 2 % Place 1 application into the nose 2 (two) times daily.    . Omega-3 Fatty Acids ([employer] FISH OIL PO) Take by mouth.    . pantoprazole (PROTONIX) 40 MG tablet Take 40 mg by mouth daily.    . TOBRAMYCIN SULFATE IN SALINE IV Inject into the vein. Tobramycin 30 mg/mL: Add 1 mL to 240 mL saline in saline irrigation bottle, irrigate sinuses with 120 mL through each nostril twice daily    . topiramate (TOPAMAX) 100 MG tablet Take 100 mg by mouth 2 (two) times daily.    Marland Kitchen warfarin (COUMADIN) 7.5 MG tablet Take 1.5 tablets by mouth daily every Sun, Tues, Thurs and take 1 tablet by mouth daily every Mon, Wed, Fri, Sat 90 tablet 0  . amLODipine (NORVASC) 10 MG tablet TAKE 1 TABLET BY MOUTH EVERY DAY (Patient not taking: Reported on 04/21/2017) 30 tablet 0  . losartan (COZAAR) 100 MG tablet Take 100 mg by mouth daily.     No current facility-administered medications for this visit.    Allergies  Allergen Reactions  . Cefuroxime Anaphylaxis    Difficulty swallowing pills because they were too dry.  Caused tablet dysphagia.  . Diazepam Shortness Of Breath    HYPERVENTILATION  HYPERVENTILATION   . Latex Rash and Shortness Of Breath    Airway swelling  . Other Palpitations    Affected heart rate/er  told her not to take again Affected heart rate/er told her not to take again  . Amlodipine Cough    Not sure  . Diclofenac Sodium Hives  . Diphenhydramine Hcl Palpitations    Affected heart rate/er told her not to take again  . Methylpyrrolidone Hives  . Ondansetron Hives and Other (See Comments)    Sedates/knocks her out Sedates/knocks her out  . Oxycodone Hives  . Penicillins Hives and Swelling    Most mycin drugs  . Butorphanol Tartrate Hives  . Ciprofloxacin     Patient prefers not to take Fluoroquinolones  . Lisinopril Cough    Caused pt to cough  . Ramipril Cough    Pt c/o cough  . Sulfa Antibiotics   . Ace Inhibitors Other (See Comments)    Lisinopril and ramipril  . Codeine Nausea Only  . Protonix  [Pantoprazole Sodium] Diarrhea      Review of Systems:  Constitutional:  No  fever, no chills, No recent illness, No unintentional weight changes.  HEENT: No  headache, no vision change, no hearing change, No sore throat, +hoarseness, No  sinus pressure  Cardiac: No  chest pain today, No  pressure, No palpitations, No  Orthopnea  Respiratory:  No  shortness of breath. No  Cough  Gastrointestinal: No  abdominal pain, No  nausea, No  vomiting  Musculoskeletal: No new myalgia/arthralgia  Skin: No  Rash  Neurologic: No  weakness, No  dizziness  Psychiatric: No  concerns with depression, No  concerns with anxiety  Exam:  BP 128/74   Pulse 65   Wt 113 lb (51.3 kg)   BMI 20.02 kg/m   Constitutional: VS see above. General Appearance: alert, well-developed, well-nourished, NAD  Eyes: Normal lids and conjunctive, non-icteric sclera  Ears, Nose, Mouth, Throat: MMM, Normal external inspection ears/nares/mouth/lips/gums. Pharynx/tonsils no erythema, no exudate. Nasal mucosa normal.   Neck: No masses, trachea midline. No thyroid enlargement. No tenderness/mass appreciated. No lymphadenopathy  Respiratory: Normal respiratory effort. no wheeze, no rhonchi, no  rales  Cardiovascular: RRR at this time, No lower extremity edema.   Musculoskeletal: Gait normal.   Neurological: Normal balance/coordination. No tremor.    Psychiatric: Normal judgment/insight. Anxious mood and affect. Oriented x3.    Results for orders placed or performed in visit on 04/21/17 (from the past 72 hour(s))  POCT INR     Status: None   Collection Time: 04/21/17  9:48 AM  Result Value Ref Range   INR 2.8     Dg Chest 2 View  Result Date: 04/21/2017 CLINICAL DATA:  Cough for 2 months EXAM: CHEST  2 VIEW COMPARISON:  None. FINDINGS: Heart and mediastinal contours are within normal limits. No focal opacities or effusions. No acute bony abnormality. IMPRESSION: No active cardiopulmonary disease. Electronically Signed   By: Rolm Baptise M.D.   On: 04/21/2017 10:26     ASSESSMENT/PLAN: She has a lot of concerns about sinus/throat issues that are being followed by ENT, without more information I really couldn't give her much advice on this, but I reassured her that it is unlikely that there is some kind of bacteria that is slowly infiltrating from her sinuses down to her vocal cords and causing a pneumonia given that she is not significantly immunocompromised, chest x-ray clear on my read and on radiologist report. We'll request records from ENT but at this point I really advised the patient follow up with them, despite her frustrations with getting an appointment with them and there are other office issues, she seems pretty satisfied with the doctors there other than she doesn't seem to be getting phone calls back to answer her questions.  Sinus complaint - need records   Anticoagulated - Plan: POCT INR  Cough - Consider PFT - Plan: DG Chest 2 View  Atrial fibrillation, unspecified type ([place])  Need for immunization against influenza - Plan: Flu vaccine HIGH DOSE PF  CKD (chronic kidney disease) stage 3, GFR 30-59 ml/min  Benign hypertension with CKD (chronic kidney  disease) stage III - Spent some time today updating medication list    Patient Instructions  Plan:  I'll request records from ENT and see what I can figure out  I'd encourage you to contact them about any questions as they are the ones primarily managing your sinus/throat/ear issues. I'm happy to help in any way I can  Let's see each other again in one month to follow-up on this. See me sooner if needed     Visit summary with medication list and pertinent instructions was printed for patient to review. All questions at time of visit were answered -  patient instructed to contact office with any additional concerns. ER/RTC precautions were reviewed with the patient. Follow-up plan: Return in about 4 weeks (around 05/19/2017) for follow up on sinus issue unless seen sooner by ENT, INR recheck.  Note: Total time spent 40 minutes, greater than 50% of the visit was spent face-to-face counseling and coordinating care for the following: The primary encounter diagnosis was Sinus complaint. Diagnoses of Anticoagulated, Cough, Atrial fibrillation, unspecified type ([place]), Need for immunization against influenza, CKD (chronic kidney disease) stage 3, GFR 30-59 ml/min, and Benign hypertension with CKD (chronic kidney disease) stage III were also pertinent to this visit.Marland Kitchen

## 2017-04-23 ENCOUNTER — Other Ambulatory Visit: Payer: Self-pay | Admitting: Osteopathic Medicine

## 2017-04-24 ENCOUNTER — Emergency Department (INDEPENDENT_AMBULATORY_CARE_PROVIDER_SITE_OTHER)
Admission: EM | Admit: 2017-04-24 | Discharge: 2017-04-24 | Disposition: A | Discharge status: Home / Self Care | Payer: Medicare Other | Source: Home / Self Care

## 2017-04-24 ENCOUNTER — Encounter: Payer: Self-pay | Admitting: Emergency Medicine

## 2017-04-24 DIAGNOSIS — R509 Fever, unspecified: Secondary | ICD-10-CM | POA: Diagnosis not present

## 2017-04-24 LAB — POCT URINALYSIS DIP (MANUAL ENTRY)
Bilirubin, UA: NEGATIVE
Blood, UA: NEGATIVE
GLUCOSE UA: NEGATIVE mg/dL
Ketones, POC UA: NEGATIVE mg/dL
LEUKOCYTES UA: NEGATIVE
NITRITE UA: NEGATIVE
SPEC GRAV UA: 1.015 (ref 1.010–1.025)
UROBILINOGEN UA: 0.2 U/dL
pH, UA: 6 (ref 5.0–8.0)

## 2017-04-24 NOTE — ED Triage Notes (Signed)
Patient presents to [facility] with C/O pain in the lower back since Thursday PM, patient rates pain 5/10 aching type pain.

## 2017-04-24 NOTE — ED Provider Notes (Signed)
[facility]    CSN: [id number] Arrival date & time: 04/24/17  1212     History   Chief Complaint Chief Complaint  Patient presents with  . Recurrent UTI    HPI Gloria Mckay is a 76 y.o. female.   The history is provided by the patient. No language interpreter was used.  Fever  Max temp prior to arrival:  102 Temp source:  Oral Severity:  Moderate Onset quality:  Gradual Duration:  2 days Progression:  Unable to specify Chronicity:  New Relieved by:  Nothing Worsened by:  Nothing Ineffective treatments:  None tried Associated symptoms: no cough, no nausea and no sore throat   Risk factors: no sick contacts   Pt denies any uti symptoms.  Pt is currently being treated with clindamycin bid and tobrex solution.   Past Medical History:  Diagnosis Date  . Arthritis   . Cancer ([place])   . Hypertension     Patient Active Problem List   Diagnosis Date Noted  . Long term (current) use of anticoagulants [Z79.01] 02/24/2017  . Primary hypercoagulable state ([place]) [D68.59] 02/24/2017  . Atrial fibrillation ([place]) [I48.91] 02/24/2017  . Acute venous embolism and thrombosis of deep vessels of proximal lower extremity ([place]) [I82.4Y9] 02/24/2017  . DDD (degenerative disc disease), cervical 02/11/2017  . Benign hypertension with CKD (chronic kidney disease) stage III 10/28/2016  . History of arthroplasty of left knee 10/25/2016  . Plantar fasciitis, left 10/22/2016  . Fibroma of tongue 06/14/2016  . Postmenopausal osteoporosis 02/06/2016  . History of pulmonary embolism 01/30/2016  . CKD (chronic kidney disease) stage 3, GFR 30-59 ml/min 08/19/2015  . Dry eye syndrome of both lacrimal glands 08/19/2015  . S/P cataract extraction and insertion of intraocular lens 08/19/2015  . Iron deficiency anemia due to chronic blood loss 02/21/2015  . Age-related macular degeneration 02/05/2015  . Congenital malformation of retina 02/05/2015  . Endothelial corneal dystrophy  02/05/2015  . Lumbar spinal stenosis 05/09/2014  . Chronic atrial fibrillation ([place]) 02/26/2014  . S/P colonoscopy 01/23/2014  . S/P endoscopy 01/23/2014  . Gastric bezoar 08/06/2013  . History of MRSA infection 12/30/2012  . Long term current use of anticoagulant therapy 08/23/2011  . Mild intermittent asthma without complication 41/63/8453  . Chest pain 01/15/2010  . Esophageal reflux 11/05/2009  . DDD (degenerative disc disease), lumbar 09/19/2009  . Infection and inflammatory reaction due to internal joint prosthesis ([place]) 08/08/2009  . Vitamin D deficiency 05/12/2009  . Chronic nausea 02/12/2009  . History of DVT of lower extremity 02/12/2009  . Recurrent major depressive disorder, in partial remission ([place]) 02/12/2009    Past Surgical History:  Procedure Laterality Date  . JOINT REPLACEMENT    . STOMACH SURGERY      OB History    No data available       Home Medications    Prior to Admission medications   Medication Sig Start Date End Date Taking? Authorizing Provider  AMBULATORY NON FORMULARY MEDICATION Home TENS unit, please use up to TID for 30 mins, this is medically necessary. 02/11/17   Silverio Decamp, MD  amLODipine (NORVASC) 10 MG tablet TAKE 1 TABLET BY MOUTH EVERY DAY Patient not taking: Reported on 04/21/2017 02/28/17   Emeterio Reeve, DO  carvedilol (COREG) 25 MG tablet Take 25 mg by mouth 2 (two) times daily with a meal.    [provider]  clindamycin (CLEOCIN) 300 MG capsule Take 300 mg by mouth 2 (two) times daily.  [provider]  ergocalciferol (VITAMIN D2) 50000 units capsule Take 1 capsule (50,000 Units total) by mouth once a week. 01/18/17   Emeterio Reeve, DO  hydrALAZINE (APRESOLINE) 25 MG tablet Take 25 mg by mouth 2 (two) times daily. 10/05/16   [provider]  hydrochlorothiazide (HYDRODIURIL) 12.5 MG tablet Take 1 tablet (12.5 mg total) by mouth daily. 12/30/16   Emeterio Reeve, DO    HYDROcodone-acetaminophen (NORCO) 7.5-325 MG tablet Take 1 tablet by mouth every 6 (six) hours as needed for moderate pain. #60 for 30(thirty) days 05/16/17 06/15/17  Emeterio Reeve, DO  hydrOXYzine (ATARAX/VISTARIL) 10 MG tablet Take 10-20 mg by mouth 3 (three) times daily as needed for anxiety.    [provider]  losartan (COZAAR) 100 MG tablet Take 100 mg by mouth daily.    [provider]  mometasone (ELOCON) 0.1 % cream Apply 1 application topically daily.    [provider]  mupirocin ointment (BACTROBAN) 2 % Place 1 application into the nose 2 (two) times daily.    [provider]  Omega-3 Fatty Acids ([facility] FISH OIL PO) Take by mouth.    [provider]  pantoprazole (PROTONIX) 40 MG tablet Take 40 mg by mouth daily.    [provider]  TOBRAMYCIN SULFATE IN SALINE IV Inject into the vein. Tobramycin 30 mg/mL: Add 1 mL to 240 mL saline in saline irrigation bottle, irrigate sinuses with 120 mL through each nostril twice daily    [provider]  topiramate (TOPAMAX) 100 MG tablet Take 100 mg by mouth 2 (two) times daily.    [provider]  warfarin (COUMADIN) 7.5 MG tablet Take 1.5 tablets by mouth daily every Sun, Tues, Thurs and take 1 tablet by mouth daily every Mon, Wed, Fri, Sat 01/26/17   Emeterio Reeve, DO    Family History History reviewed. No pertinent family history.  Social History Social History  Substance Use Topics  . Smoking status: Never Smoker  . Smokeless tobacco: Never Used  . Alcohol use No     Allergies   Cefuroxime; Diazepam; Latex; Other; Amlodipine; Diclofenac sodium; Diphenhydramine hcl; Methylpyrrolidone; Ondansetron; Oxycodone; Penicillins; Butorphanol tartrate; Ciprofloxacin; Lisinopril; Ramipril; Sulfa antibiotics; Ace inhibitors; Codeine; and Protonix  [pantoprazole sodium]   Review of Systems Review of Systems  Constitutional: Positive for fever.  HENT: Negative for  sore throat.   Respiratory: Negative for cough.   Gastrointestinal: Negative for nausea.  All other systems reviewed and are negative.    Physical Exam Triage Vital Signs ED Triage Vitals  Enc Vitals Group     BP 04/24/17 1232 (!) 165/80     Pulse --      Resp 04/24/17 1232 16     Temp 04/24/17 1232 98.1 F (36.7 C)     Temp Source 04/24/17 1232 Oral     SpO2 04/24/17 1232 99 %     Weight 04/24/17 1233 112 lb 12 oz (51.1 kg)     Height 04/24/17 1233 5\' 3"  (1.6 m)     Head Circumference --      Peak Flow --      Pain Score 04/24/17 1233 5     Pain Loc --      Pain Edu? --      Excl. in [place]? --    No data found.   Updated Vital Signs BP (!) 165/80 (BP Location: Left Arm)   Temp 98.1 F (36.7 C) (Oral)   Resp 16   Ht 5'  3" (1.6 m)   Wt 112 lb 12 oz (51.1 kg)   SpO2 99%   BMI 19.97 kg/m   Visual Acuity Right Eye Distance:   Left Eye Distance:   Bilateral Distance:    Right Eye Near:   Left Eye Near:    Bilateral Near:     Physical Exam  Constitutional: She appears well-developed and well-nourished. No distress.  HENT:  Head: Normocephalic and atraumatic.  Right Ear: External ear normal.  Left Ear: External ear normal.  Nose: Nose normal.  Mouth/Throat: Oropharynx is clear and moist.  Tm's clear  Eyes: Conjunctivae are normal.  Neck: Neck supple.  Cardiovascular: Normal rate and regular rhythm.   No murmur heard. Pulmonary/Chest: Effort normal and breath sounds normal. No respiratory distress.  Abdominal: Soft. There is no tenderness.  Musculoskeletal: She exhibits no edema.  Neurological: She is alert.  Skin: Skin is warm and dry.  Psychiatric: She has a normal mood and affect.  Nursing note and vitals reviewed.    UC Treatments / Results  Labs (all labs ordered are listed, but only abnormal results are displayed) Labs Reviewed  POCT URINALYSIS DIP (MANUAL ENTRY) - Abnormal; Notable for the following:       Result Value   Protein Ur, POC trace  (*)    All other components within normal limits    EKG  EKG Interpretation None       Radiology No results found.  Procedures Procedures (including critical care time)  Medications Ordered in UC Medications - No data to display   Initial Impression / Assessment and Plan / UC Course  I have reviewed the triage vital signs and the nursing notes.  Pertinent labs & imaging results that were available during my care of the patient were reviewed by me and considered in my medical decision making (see chart for details).     No sign of infection.   Pt has appointment with Dr. Sheppard Coil tomorrow.  I advised pt I am going to hold off on any other antibiotic.  Pt is to monitor her temperature.   Final Clinical Impressions(s) / UC Diagnoses   Final diagnoses:  Fever, unspecified    New Prescriptions New Prescriptions   No medications on file     Controlled Substance Prescriptions [place] Controlled Substance Registry consulted? Not Applicable  An After Visit Summary was printed and given to the patient.   Fransico [place], [place] 04/24/17 1312

## 2017-04-24 NOTE — Discharge Instructions (Signed)
See Dr. Sheppard Coil for recheck tomorrow or Tuesday.

## 2017-04-25 ENCOUNTER — Ambulatory Visit (INDEPENDENT_AMBULATORY_CARE_PROVIDER_SITE_OTHER): Payer: Medicare Other | Admitting: Osteopathic Medicine

## 2017-04-25 VITALS — BP 156/78 | HR 51 | Temp 98.0°F | Wt 112.0 lb

## 2017-04-25 DIAGNOSIS — R5083 Postvaccination fever: Secondary | ICD-10-CM | POA: Diagnosis not present

## 2017-04-25 DIAGNOSIS — B965 Pseudomonas (aeruginosa) (mallei) (pseudomallei) as the cause of diseases classified elsewhere: Secondary | ICD-10-CM

## 2017-04-25 DIAGNOSIS — N183 Chronic kidney disease, stage 3 (moderate): Secondary | ICD-10-CM | POA: Diagnosis not present

## 2017-04-25 DIAGNOSIS — G8929 Other chronic pain: Secondary | ICD-10-CM | POA: Diagnosis not present

## 2017-04-25 DIAGNOSIS — M545 Low back pain, unspecified: Secondary | ICD-10-CM

## 2017-04-25 DIAGNOSIS — I129 Hypertensive chronic kidney disease with stage 1 through stage 4 chronic kidney disease, or unspecified chronic kidney disease: Secondary | ICD-10-CM | POA: Diagnosis not present

## 2017-04-25 DIAGNOSIS — A498 Other bacterial infections of unspecified site: Secondary | ICD-10-CM | POA: Insufficient documentation

## 2017-04-25 NOTE — Patient Instructions (Signed)
Plan:  Please call the ENT that Urgent Care sent you to if you don't hear back within one week  Continue current nasal rinse treatment as directed by the ENT specialist you saw at [facility].   I think the fever was likely due to immunization or mild viral infection. If you feel sick again, please come see [place]!

## 2017-04-25 NOTE — Progress Notes (Signed)
. HPI: Gloria Mckay is a 76 y.o. female  who presents to [facility] today, 04/25/17,  for chief complaint of:  Chief Complaint  Patient presents with  . Back Pain    left side    Started 3 days ago, day after visit with me. 04/21/17. Associated with back pain and fever to 102.  Fever responsive to Tylenol. Was evaluated in UC 1 day ago. Records reviewed: afebrile and no concerns on UA. Now feeling like pain is much better but is still there, hasn't gone away completely. Low back on the L Is a lot better. Occasional urinary frequency, ongoing several years. No dysuria, no blood in the urine or foul odor.  At last visit, she also had some questions about recent management by ENT. Reviewed these records. She grew pseudomonas aeruginosa from nasal swab, there were some small polyp/atrophy on left focal cord area. Urgent care referred her to different ENT for second opinion.    Past medical history, surgical history, social history and family history reviewed.  Patient Active Problem List   Diagnosis Date Noted  . Pseudomonas infection 04/25/2017  . Long term (current) use of anticoagulants [Z79.01] 02/24/2017  . Primary hypercoagulable state ([place]) [D68.59] 02/24/2017  . Atrial fibrillation ([place]) [I48.91] 02/24/2017  . Acute venous embolism and thrombosis of deep vessels of proximal lower extremity ([place]) [I82.4Y9] 02/24/2017  . DDD (degenerative disc disease), cervical 02/11/2017  . Benign hypertension with CKD (chronic kidney disease) stage III 10/28/2016  . History of arthroplasty of left knee 10/25/2016  . Plantar fasciitis, left 10/22/2016  . Fibroma of tongue 06/14/2016  . Postmenopausal osteoporosis 02/06/2016  . History of pulmonary embolism 01/30/2016  . CKD (chronic kidney disease) stage 3, GFR 30-59 ml/min 08/19/2015  . Dry eye syndrome of both lacrimal glands 08/19/2015  . S/P cataract extraction and insertion of intraocular lens 08/19/2015   . Iron deficiency anemia due to chronic blood loss 02/21/2015  . Age-related macular degeneration 02/05/2015  . Congenital malformation of retina 02/05/2015  . Endothelial corneal dystrophy 02/05/2015  . Lumbar spinal stenosis 05/09/2014  . Chronic atrial fibrillation ([place]) 02/26/2014  . S/P colonoscopy 01/23/2014  . S/P endoscopy 01/23/2014  . Gastric bezoar 08/06/2013  . History of MRSA infection 12/30/2012  . Long term current use of anticoagulant therapy 08/23/2011  . Mild intermittent asthma without complication 85/63/1497  . Chest pain 01/15/2010  . Esophageal reflux 11/05/2009  . DDD (degenerative disc disease), lumbar 09/19/2009  . Infection and inflammatory reaction due to internal joint prosthesis ([place]) 08/08/2009  . Vitamin D deficiency 05/12/2009  . Chronic nausea 02/12/2009  . History of DVT of lower extremity 02/12/2009  . Recurrent major depressive disorder, in partial remission ([place]) 02/12/2009    Current medication list and allergy/intolerance information reviewed.   Current Outpatient Prescriptions on File Prior to Visit  Medication Sig Dispense Refill  . AMBULATORY NON FORMULARY MEDICATION Home TENS unit, please use up to TID for 30 mins, this is medically necessary. 1 each 0  . amLODipine (NORVASC) 10 MG tablet TAKE 1 TABLET BY MOUTH EVERY DAY 30 tablet 0  . carvedilol (COREG) 25 MG tablet Take 25 mg by mouth 2 (two) times daily with a meal.    . clindamycin (CLEOCIN) 300 MG capsule Take 300 mg by mouth 2 (two) times daily.     . ergocalciferol (VITAMIN D2) 50000 units capsule Take 1 capsule (50,000 Units total) by mouth once a week. 8 capsule 2  .  hydrALAZINE (APRESOLINE) 25 MG tablet Take 25 mg by mouth 2 (two) times daily.    . hydrochlorothiazide (HYDRODIURIL) 12.5 MG tablet Take 1 tablet (12.5 mg total) by mouth daily. 30 tablet 1  . [START ON 05/16/2017] HYDROcodone-acetaminophen (NORCO) 7.5-325 MG tablet Take 1 tablet by mouth every 6 (six) hours as needed  for moderate pain. #60 for 30(thirty) days 60 tablet 0  . hydrOXYzine (ATARAX/VISTARIL) 10 MG tablet Take 10-20 mg by mouth 3 (three) times daily as needed for anxiety.    Marland Kitchen losartan (COZAAR) 100 MG tablet Take 100 mg by mouth daily.    . mometasone (ELOCON) 0.1 % cream Apply 1 application topically daily.    . mupirocin ointment (BACTROBAN) 2 % Place 1 application into the nose 2 (two) times daily.    . Omega-3 Fatty Acids ([facility] FISH OIL PO) Take by mouth.    . pantoprazole (PROTONIX) 40 MG tablet Take 40 mg by mouth daily.    . TOBRAMYCIN SULFATE IN SALINE IV Inject into the vein. Tobramycin 30 mg/mL: Add 1 mL to 240 mL saline in saline irrigation bottle, irrigate sinuses with 120 mL through each nostril twice daily    . topiramate (TOPAMAX) 100 MG tablet Take 100 mg by mouth 2 (two) times daily.    Marland Kitchen warfarin (COUMADIN) 7.5 MG tablet Take 1.5 tablets by mouth daily every Sun, Tues, Thurs and take 1 tablet by mouth daily every Mon, Wed, Fri, Sat 90 tablet 0   No current facility-administered medications on file prior to visit.    Allergies  Allergen Reactions  . Cefuroxime Anaphylaxis    Difficulty swallowing pills because they were too dry.  Caused tablet dysphagia.  . Diazepam Shortness Of Breath    HYPERVENTILATION  HYPERVENTILATION   . Latex Rash and Shortness Of Breath    Airway swelling  . Other Palpitations    Affected heart rate/er told her not to take again Affected heart rate/er told her not to take again  . Amlodipine Cough    Not sure  . Diclofenac Sodium Hives  . Diphenhydramine Hcl Palpitations    Affected heart rate/er told her not to take again  . Methylpyrrolidone Hives  . Ondansetron Hives and Other (See Comments)    Sedates/knocks her out Sedates/knocks her out  . Oxycodone Hives  . Penicillins Hives and Swelling    Most mycin drugs  . Butorphanol Tartrate Hives  . Ciprofloxacin     Patient prefers not to take Fluoroquinolones  . Lisinopril Cough    Caused  pt to cough  . Ramipril Cough    Pt c/o cough  . Sulfa Antibiotics   . Ace Inhibitors Other (See Comments)    Lisinopril and ramipril  . Codeine Nausea Only  . Protonix  [Pantoprazole Sodium] Diarrhea      Review of Systems:  Constitutional: No recent illness  HEENT: No  headache, no vision change  Cardiac: No  chest pain, No  pressure, No palpitations  Respiratory:  No  shortness of breath. No  Cough  Gastrointestinal: No  abdominal pain  Musculoskeletal: No new myalgia/arthralgia  Skin: No  Rash  Neurologic: No  weakness, No  Dizziness  Exam:  BP (!) 156/79   Pulse 60   Temp 98 F (36.7 C) (Oral)   Wt 112 lb (50.8 kg)   BMI 19.84 kg/m   Constitutional: VS see above. General Appearance: alert, well-developed, well-nourished, NAD  Eyes: Normal lids and conjunctive, non-icteric sclera  Ears, Nose, Mouth,  Throat: MMM, Normal external inspection ears/nares/mouth/lips/gums.  Neck: No masses, trachea midline.   Respiratory: Normal respiratory effort. no wheeze, no rhonchi, no rales  Cardiovascular: S1/S2 normal, no murmur, no rub/gallop auscultated. RRR.   Musculoskeletal: Gait normal. Symmetric and independent movement of all extremities  Neurological: Normal balance/coordination. No tremor.  Skin: warm, dry, intact.   Psychiatric: Normal judgment/insight. Normal mood and affect. Oriented x3.    Dg Chest 2 View  Result Date: 04/21/2017 CLINICAL DATA:  Cough for 2 months EXAM: CHEST  2 VIEW COMPARISON:  None. FINDINGS: Heart and mediastinal contours are within normal limits. No focal opacities or effusions. No acute bony abnormality. IMPRESSION: No active cardiopulmonary disease. Electronically Signed   By: Rolm Baptise M.D.   On: 04/21/2017 10:26     ASSESSMENT/PLAN:   Fever associated with immunization - I think most likely spiked a fever after flu shot. Has resolved, no signs or symptoms of infection currently  Pseudomonas infection - Colonizer  versus active infection, continue tobramycin as directed by ENT until can get second opinion from the specialist  Chronic left-sided low back pain without sciatica - Symptoms improved, home instructions printed, patient is already on pain management regimen  Benign hypertension with CKD (chronic kidney disease) stage III - Patient reports quite stressed today, will recheck when she is back for INR. Recent blood pressure was in normal range     Follow-up plan: Return if symptoms worsen or fail to improve, and when due for next INR check.  Visit summary with medication list and pertinent instructions was printed for patient to review, alert [place] if any changes needed. All questions at time of visit were answered - patient instructed to contact office with any additional concerns. ER/RTC precautions were reviewed with the patient and understanding verbalized.   Note: Total time spent 25 minutes, greater than 50% of the visit was spent face-to-face counseling and coordinating care for the following: The primary encounter diagnosis was Fever associated with immunization. Diagnoses of Pseudomonas infection, Chronic left-sided low back pain without sciatica, and Benign hypertension with CKD (chronic kidney disease) stage III were also pertinent to this visit.Marland Kitchen

## 2017-05-03 ENCOUNTER — Other Ambulatory Visit: Payer: Self-pay

## 2017-05-03 ENCOUNTER — Ambulatory Visit (INDEPENDENT_AMBULATORY_CARE_PROVIDER_SITE_OTHER): Payer: Medicare Other | Admitting: Physician Assistant

## 2017-05-03 ENCOUNTER — Other Ambulatory Visit: Payer: Self-pay | Admitting: Osteopathic Medicine

## 2017-05-03 DIAGNOSIS — I480 Paroxysmal atrial fibrillation: Secondary | ICD-10-CM

## 2017-05-03 DIAGNOSIS — Z7901 Long term (current) use of anticoagulants: Secondary | ICD-10-CM

## 2017-05-03 DIAGNOSIS — D6859 Other primary thrombophilia: Secondary | ICD-10-CM | POA: Diagnosis not present

## 2017-05-03 DIAGNOSIS — I824Y9 Acute embolism and thrombosis of unspecified deep veins of unspecified proximal lower extremity: Secondary | ICD-10-CM

## 2017-05-03 LAB — POCT INR: INR: 2.2

## 2017-05-03 NOTE — Progress Notes (Signed)
Pt is here for an INR. Denies changes in diet, new medications, and skipped doses.  Pt was advised to take 1.5 tablet on Tuesday and 1 tab all other days.  She reports she was taking 1 tab qd.  Will route to provider for further review. -EH/RMA  INR in normal range. Stay on same dose and recheck in 4 weeks. Iran Planas PA-C

## 2017-05-04 ENCOUNTER — Telehealth: Payer: Self-pay | Admitting: Osteopathic Medicine

## 2017-05-04 MED ORDER — WARFARIN SODIUM 7.5 MG PO TABS: ORAL_TABLET | ORAL | 0 refills | Status: DC

## 2017-05-04 NOTE — Telephone Encounter (Signed)
Pt called. She was enquiring about her Warfarin, I told her that her rx was sent to [facility] yesterday.  She is only on one pill now and it takes 7 days to get order by [facility]. She wants to know if in the meantime we  can send a rx in to [facility] [address] [facility] [place]  until her order arrives.   Thank you.

## 2017-05-04 NOTE — Telephone Encounter (Signed)
rx has been sent to local pharmacy. Saroya Riccobono,CMA

## 2017-05-04 NOTE — Progress Notes (Signed)
Pt notified and transferred to scheduling -[place]

## 2017-05-04 NOTE — Telephone Encounter (Signed)
OK to send local

## 2017-05-04 NOTE — Telephone Encounter (Signed)
Pt advised.

## 2017-05-06 ENCOUNTER — Other Ambulatory Visit: Payer: Self-pay | Admitting: Osteopathic Medicine

## 2017-05-06 MED ORDER — AMLODIPINE BESYLATE 10 MG PO TABS: 10.0000 mg | ORAL_TABLET | Freq: Every day | ORAL | 3 refills | Status: DC

## 2017-05-06 MED ORDER — HYDRALAZINE HCL 25 MG PO TABS: 25.0000 mg | ORAL_TABLET | Freq: Two times a day (BID) | ORAL | 3 refills | Status: DC

## 2017-05-20 ENCOUNTER — Ambulatory Visit (INDEPENDENT_AMBULATORY_CARE_PROVIDER_SITE_OTHER): Payer: Medicare Other | Admitting: Osteopathic Medicine

## 2017-05-20 ENCOUNTER — Encounter: Payer: Self-pay | Admitting: Osteopathic Medicine

## 2017-05-20 VITALS — BP 145/78 | HR 64 | Wt 110.0 lb

## 2017-05-20 DIAGNOSIS — B965 Pseudomonas (aeruginosa) (mallei) (pseudomallei) as the cause of diseases classified elsewhere: Secondary | ICD-10-CM

## 2017-05-20 DIAGNOSIS — D6859 Other primary thrombophilia: Secondary | ICD-10-CM | POA: Diagnosis not present

## 2017-05-20 DIAGNOSIS — I480 Paroxysmal atrial fibrillation: Secondary | ICD-10-CM

## 2017-05-20 DIAGNOSIS — H9193 Unspecified hearing loss, bilateral: Secondary | ICD-10-CM | POA: Diagnosis not present

## 2017-05-20 DIAGNOSIS — Z7901 Long term (current) use of anticoagulants: Secondary | ICD-10-CM | POA: Diagnosis not present

## 2017-05-20 DIAGNOSIS — A498 Other bacterial infections of unspecified site: Secondary | ICD-10-CM

## 2017-05-20 DIAGNOSIS — I482 Chronic atrial fibrillation, unspecified: Secondary | ICD-10-CM

## 2017-05-20 DIAGNOSIS — R0989 Other specified symptoms and signs involving the circulatory and respiratory systems: Secondary | ICD-10-CM

## 2017-05-20 LAB — POCT INR: INR: 2.2

## 2017-05-20 MED ORDER — CARVEDILOL 25 MG PO TABS: 25.0000 mg | ORAL_TABLET | Freq: Two times a day (BID) | ORAL | 3 refills | Status: DC

## 2017-05-20 NOTE — Progress Notes (Signed)
HPI: Gloria Mckay is a 76 y.o. female  who presents to [facility] today, 05/20/17,  for chief complaint of:  Chief Complaint  Patient presents with  . Follow-up    SINUS, AND INR    Patient has follow-up in place with [facility] specialist for discussion of chronic sinus issues and concern for hoarseness/ear pain. Spent a lot of time today discussing hearing aid issues with me, a bit displeased with current manufacturer.   INR check as below, within range.  Past medical history, surgical history, social history and family history reviewed.  Patient Active Problem List   Diagnosis Date Noted  . Pseudomonas infection 04/25/2017  . Long term (current) use of anticoagulants [Z79.01] 02/24/2017  . Primary hypercoagulable state ([place]) [D68.59] 02/24/2017  . Atrial fibrillation ([place]) [I48.91] 02/24/2017  . Acute venous embolism and thrombosis of deep vessels of proximal lower extremity ([place]) [I82.4Y9] 02/24/2017  . DDD (degenerative disc disease), cervical 02/11/2017  . Benign hypertension with CKD (chronic kidney disease) stage III 10/28/2016  . History of arthroplasty of left knee 10/25/2016  . Plantar fasciitis, left 10/22/2016  . Fibroma of tongue 06/14/2016  . Postmenopausal osteoporosis 02/06/2016  . History of pulmonary embolism 01/30/2016  . CKD (chronic kidney disease) stage 3, GFR 30-59 ml/min 08/19/2015  . Dry eye syndrome of both lacrimal glands 08/19/2015  . S/P cataract extraction and insertion of intraocular lens 08/19/2015  . Iron deficiency anemia due to chronic blood loss 02/21/2015  . Age-related macular degeneration 02/05/2015  . Congenital malformation of retina 02/05/2015  . Endothelial corneal dystrophy 02/05/2015  . Lumbar spinal stenosis 05/09/2014  . Chronic atrial fibrillation ([place]) 02/26/2014  . S/P colonoscopy 01/23/2014  . S/P endoscopy 01/23/2014  . Gastric bezoar 08/06/2013  . History of MRSA infection 12/30/2012  .  Long term current use of anticoagulant therapy 08/23/2011  . Mild intermittent asthma without complication 58/04/9832  . Chest pain 01/15/2010  . Esophageal reflux 11/05/2009  . DDD (degenerative disc disease), lumbar 09/19/2009  . Infection and inflammatory reaction due to internal joint prosthesis ([place]) 08/08/2009  . Vitamin D deficiency 05/12/2009  . Chronic nausea 02/12/2009  . History of DVT of lower extremity 02/12/2009  . Recurrent major depressive disorder, in partial remission ([place]) 02/12/2009    Current medication list and allergy/intolerance information reviewed.   Current Outpatient Prescriptions on File Prior to Visit  Medication Sig Dispense Refill  . AMBULATORY NON FORMULARY MEDICATION Home TENS unit, please use up to TID for 30 mins, this is medically necessary. 1 each 0  . amLODipine (NORVASC) 10 MG tablet Take 1 tablet (10 mg total) by mouth daily. 90 tablet 3  . carvedilol (COREG) 25 MG tablet Take 25 mg by mouth 2 (two) times daily with a meal.    . ergocalciferol (VITAMIN D2) 50000 units capsule Take 1 capsule (50,000 Units total) by mouth once a week. 8 capsule 2  . hydrALAZINE (APRESOLINE) 25 MG tablet Take 1 tablet (25 mg total) by mouth 2 (two) times daily. 180 tablet 3  . hydrochlorothiazide (HYDRODIURIL) 12.5 MG tablet Take 1 tablet (12.5 mg total) by mouth daily. 30 tablet 1  . HYDROcodone-acetaminophen (NORCO) 7.5-325 MG tablet Take 1 tablet by mouth every 6 (six) hours as needed for moderate pain. #60 for 30(thirty) days 60 tablet 0  . hydrOXYzine (ATARAX/VISTARIL) 10 MG tablet Take 10-20 mg by mouth 3 (three) times daily as needed for anxiety.    Marland Kitchen losartan (COZAAR) 100 MG tablet  Take 100 mg by mouth daily.    . mometasone (ELOCON) 0.1 % cream Apply 1 application topically daily.    . mupirocin ointment (BACTROBAN) 2 % Place 1 application into the nose 2 (two) times daily.    . Omega-3 Fatty Acids ([employer] FISH OIL PO) Take by mouth.    . pantoprazole (PROTONIX)  40 MG tablet Take 40 mg by mouth daily.    . TOBRAMYCIN SULFATE IN SALINE IV Inject into the vein. Tobramycin 30 mg/mL: Add 1 mL to 240 mL saline in saline irrigation bottle, irrigate sinuses with 120 mL through each nostril twice daily    . topiramate (TOPAMAX) 100 MG tablet Take 100 mg by mouth 2 (two) times daily.    Marland Kitchen warfarin (COUMADIN) 7.5 MG tablet TAKE 1 AND 1/2 TABLETS DAILY EVERY SUNDAY, TUESDAY, THURSDAY THEN TAKE 1 TABLET EVERY MONDAY, WEDNESDAY, FRIDAY AND SATUDAY 30 tablet 0   No current facility-administered medications on file prior to visit.    Allergies  Allergen Reactions  . Cefuroxime Anaphylaxis    Difficulty swallowing pills because they were too dry.  Caused tablet dysphagia.  . Diazepam Shortness Of Breath    HYPERVENTILATION  HYPERVENTILATION   . Latex Rash and Shortness Of Breath    Airway swelling  . Other Palpitations    Affected heart rate/er told her not to take again Affected heart rate/er told her not to take again  . Amlodipine Cough    Not sure  . Diclofenac Sodium Hives  . Diphenhydramine Hcl Palpitations    Affected heart rate/er told her not to take again  . Methylpyrrolidone Hives  . Ondansetron Hives and Other (See Comments)    Sedates/knocks her out Sedates/knocks her out  . Oxycodone Hives  . Penicillins Hives and Swelling    Most mycin drugs  . Butorphanol Tartrate Hives  . Ciprofloxacin     Patient prefers not to take Fluoroquinolones  . Lisinopril Cough    Caused pt to cough  . Ramipril Cough    Pt c/o cough  . Sulfa Antibiotics   . Ace Inhibitors Other (See Comments)    Lisinopril and ramipril  . Codeine Nausea Only  . Protonix  [Pantoprazole Sodium] Diarrhea      Review of Systems:  Constitutional: No recent illness  HEENT: No  headache, no vision change  Cardiac: No  chest pain, No  pressure, No palpitations  Respiratory:  No  shortness of breath.  Exam:  BP (!) 145/78   Pulse 64   Wt 110 lb (49.9 kg)   BMI  19.49 kg/m   Constitutional: VS see above. General Appearance: alert, well-developed, well-nourished, NAD  Eyes: Normal lids and conjunctive, non-icteric sclera  Ears, Nose, Mouth, Throat: MMM, Normal external inspection ears/nares/mouth/lips/gums.  Neck: No masses, trachea midline.   Respiratory: Normal respiratory effort. no wheeze, no rhonchi, no rales  Cardiovascular: S1/S2 normal, no murmur, no rub/gallop auscultated. RRR.   Musculoskeletal: Gait normal. Symmetric and independent movement of all extremities  Neurological: Normal balance/coordination. No tremor.  Skin: warm, dry, intact.   Psychiatric: Normal judgment/insight. Normal mood and affect. Oriented x3.    Results for orders placed or performed in visit on 05/20/17 (from the past 24 hour(s))  POCT INR     Status: None   Collection Time: 05/20/17 10:50 AM  Result Value Ref Range   INR 2.2      ASSESSMENT/PLAN:   Sinus complaint - followup with ENT  Chronic atrial fibrillation ([place]) - Plan:  POCT INR  Long term (current) use of anticoagulants  Primary hypercoagulable state ([place])  Paroxysmal atrial fibrillation (HCC)  Pseudomonas infection - At previous visits, was discussed that she should talk about this more with ENT  Bilateral hearing loss, unspecified hearing loss type - Try to help her with app on her phone to help contorl setting, advised that she follow up with manufacturer    Patient Instructions  Bacteria:  pseudomonas aeruginosa   Would not worry about it until follow-up with ENT in October.     Follow-up plan: Return in about 4 weeks (around 06/17/2017) for INR RECHECK - nurse visit .  Visit summary with medication list and pertinent instructions was printed for patient to review, alert [place] if any changes needed. All questions at time of visit were answered - patient instructed to contact office with any additional concerns. ER/RTC precautions were reviewed with the patient and  understanding verbalized.   Note: Total time spent 15 minutes, greater than 50% of the visit was spent face-to-face counseling and coordinating care for the following: The primary encounter diagnosis was Sinus complaint. Diagnoses of Chronic atrial fibrillation ([place]), Long term (current) use of anticoagulants, Primary hypercoagulable state ([place]), Paroxysmal atrial fibrillation ([place]), Pseudomonas infection, and Bilateral hearing loss, unspecified hearing loss type were also pertinent to this visit.Marland Kitchen

## 2017-05-20 NOTE — Patient Instructions (Addendum)
Bacteria:  pseudomonas aeruginosa   Would not worry about it until follow-up with ENT in October.

## 2017-06-01 ENCOUNTER — Ambulatory Visit: Payer: Medicare Other

## 2017-06-02 DIAGNOSIS — J383 Other diseases of vocal cords: Secondary | ICD-10-CM | POA: Diagnosis not present

## 2017-06-02 DIAGNOSIS — Q825 Congenital non-neoplastic nevus: Secondary | ICD-10-CM | POA: Diagnosis not present

## 2017-06-02 DIAGNOSIS — J311 Chronic nasopharyngitis: Secondary | ICD-10-CM | POA: Diagnosis not present

## 2017-06-11 DIAGNOSIS — Z1231 Encounter for screening mammogram for malignant neoplasm of breast: Secondary | ICD-10-CM | POA: Diagnosis not present

## 2017-06-11 LAB — HM MAMMOGRAPHY

## 2017-06-13 DIAGNOSIS — R1011 Right upper quadrant pain: Secondary | ICD-10-CM | POA: Diagnosis not present

## 2017-06-13 DIAGNOSIS — Z9889 Other specified postprocedural states: Secondary | ICD-10-CM | POA: Diagnosis not present

## 2017-06-13 DIAGNOSIS — D500 Iron deficiency anemia secondary to blood loss (chronic): Secondary | ICD-10-CM | POA: Diagnosis not present

## 2017-06-14 ENCOUNTER — Encounter: Payer: Self-pay | Admitting: Osteopathic Medicine

## 2017-06-14 ENCOUNTER — Ambulatory Visit (INDEPENDENT_AMBULATORY_CARE_PROVIDER_SITE_OTHER): Payer: Medicare Other | Admitting: Osteopathic Medicine

## 2017-06-14 VITALS — BP 161/79 | HR 55 | Wt 112.0 lb

## 2017-06-14 DIAGNOSIS — B965 Pseudomonas (aeruginosa) (mallei) (pseudomallei) as the cause of diseases classified elsewhere: Secondary | ICD-10-CM | POA: Diagnosis not present

## 2017-06-14 DIAGNOSIS — G8928 Other chronic postprocedural pain: Secondary | ICD-10-CM | POA: Diagnosis not present

## 2017-06-14 DIAGNOSIS — I10 Essential (primary) hypertension: Secondary | ICD-10-CM

## 2017-06-14 DIAGNOSIS — N183 Chronic kidney disease, stage 3 (moderate): Secondary | ICD-10-CM | POA: Diagnosis not present

## 2017-06-14 DIAGNOSIS — Z0189 Encounter for other specified special examinations: Secondary | ICD-10-CM

## 2017-06-14 DIAGNOSIS — A498 Other bacterial infections of unspecified site: Secondary | ICD-10-CM

## 2017-06-14 DIAGNOSIS — I4891 Unspecified atrial fibrillation: Secondary | ICD-10-CM

## 2017-06-14 DIAGNOSIS — M5136 Other intervertebral disc degeneration, lumbar region: Secondary | ICD-10-CM | POA: Diagnosis not present

## 2017-06-14 DIAGNOSIS — I129 Hypertensive chronic kidney disease with stage 1 through stage 4 chronic kidney disease, or unspecified chronic kidney disease: Secondary | ICD-10-CM

## 2017-06-14 DIAGNOSIS — M51369 Other intervertebral disc degeneration, lumbar region without mention of lumbar back pain or lower extremity pain: Secondary | ICD-10-CM

## 2017-06-14 DIAGNOSIS — J383 Other diseases of vocal cords: Secondary | ICD-10-CM

## 2017-06-14 MED ORDER — HYDROCODONE-ACETAMINOPHEN 7.5-325 MG PO TABS: 1.0000 | ORAL_TABLET | Freq: Four times a day (QID) | ORAL | 0 refills | Status: DC | PRN

## 2017-06-14 MED ORDER — HYDROCHLOROTHIAZIDE 25 MG PO TABS: 25.0000 mg | ORAL_TABLET | Freq: Every day | ORAL | 1 refills | Status: DC

## 2017-06-14 NOTE — Progress Notes (Signed)
HPI: Gloria Mckay is a 76 y.o. female  who presents to [facility] today, 06/14/17,  for chief complaint of:  Chief Complaint  Patient presents with  . Follow-up    blood pressure    HTN: BP slightly elevated today, worse on recheck. No chest pain, pressure, shortness of breath.  Afib: INR greater than 4.5 on POC test, will send to lab for serum draw. No bleeding - no blood in stool, no bleeding from gums, no epistaxis.  GI: seen yesterday, more concerned about liver and pancreas issues per patient. She presents lab order to me, asks if I can just add this blood work onto what she is getting today for INR.  Sinus: recently saw different ENT, Dr. Dimas Millin, she was satisfied with this physician. Discussed vocal cord dysfunction, possible speech therapy for this issue. Discussed Pseudomonas infection, patient has less concerned about this traveling from sinuses down into vocal cords or lungs.  Lumbar degenerative disc disease and associated chronic pain: Patient states due for refill of hydrocodone. Prescription monitoring program reviewed, no concerns  Past medical history, surgical history, social history and family history reviewed.  Patient Active Problem List   Diagnosis Date Noted  . Pseudomonas infection 04/25/2017  . Long term (current) use of anticoagulants [Z79.01] 02/24/2017  . Primary hypercoagulable state ([place]) [D68.59] 02/24/2017  . Atrial fibrillation ([place]) [I48.91] 02/24/2017  . Acute venous embolism and thrombosis of deep vessels of proximal lower extremity ([place]) [I82.4Y9] 02/24/2017  . DDD (degenerative disc disease), cervical 02/11/2017  . Benign hypertension with CKD (chronic kidney disease) stage III ([place]) 10/28/2016  . History of arthroplasty of left knee 10/25/2016  . Plantar fasciitis, left 10/22/2016  . Fibroma of tongue 06/14/2016  . Postmenopausal osteoporosis 02/06/2016  . History of pulmonary embolism 01/30/2016  . CKD  (chronic kidney disease) stage 3, GFR 30-59 ml/min (HCC) 08/19/2015  . Dry eye syndrome of both lacrimal glands 08/19/2015  . S/P cataract extraction and insertion of intraocular lens 08/19/2015  . Iron deficiency anemia due to chronic blood loss 02/21/2015  . Age-related macular degeneration 02/05/2015  . Congenital malformation of retina 02/05/2015  . Endothelial corneal dystrophy 02/05/2015  . Lumbar spinal stenosis 05/09/2014  . Chronic atrial fibrillation ([place]) 02/26/2014  . S/P colonoscopy 01/23/2014  . S/P endoscopy 01/23/2014  . Gastric bezoar 08/06/2013  . History of MRSA infection 12/30/2012  . Long term current use of anticoagulant therapy 08/23/2011  . Mild intermittent asthma without complication 93/81/8299  . Chest pain 01/15/2010  . Esophageal reflux 11/05/2009  . DDD (degenerative disc disease), lumbar 09/19/2009  . Infection and inflammatory reaction due to internal joint prosthesis ([place]) 08/08/2009  . Vitamin D deficiency 05/12/2009  . Chronic nausea 02/12/2009  . History of DVT of lower extremity 02/12/2009  . Recurrent major depressive disorder, in partial remission ([place]) 02/12/2009    Current medication list and allergy/intolerance information reviewed.   Current Outpatient Prescriptions on File Prior to Visit  Medication Sig Dispense Refill  . AMBULATORY NON FORMULARY MEDICATION Home TENS unit, please use up to TID for 30 mins, this is medically necessary. 1 each 0  . amLODipine (NORVASC) 10 MG tablet Take 1 tablet (10 mg total) by mouth daily. 90 tablet 3  . carvedilol (COREG) 25 MG tablet Take 1 tablet (25 mg total) by mouth 2 (two) times daily with a meal. 90 tablet 3  . ergocalciferol (VITAMIN D2) 50000 units capsule Take 1 capsule (50,000 Units total) by mouth once  a week. 8 capsule 2  . hydrALAZINE (APRESOLINE) 25 MG tablet Take 1 tablet (25 mg total) by mouth 2 (two) times daily. 180 tablet 3  . hydrochlorothiazide (HYDRODIURIL) 12.5 MG tablet Take 1  tablet (12.5 mg total) by mouth daily. 30 tablet 1  . HYDROcodone-acetaminophen (NORCO) 7.5-325 MG tablet Take 1 tablet by mouth every 6 (six) hours as needed for moderate pain. #60 for 30(thirty) days 60 tablet 0  . hydrOXYzine (ATARAX/VISTARIL) 10 MG tablet Take 10-20 mg by mouth 3 (three) times daily as needed for anxiety.    Marland Kitchen losartan (COZAAR) 100 MG tablet Take 100 mg by mouth daily.    . mometasone (ELOCON) 0.1 % cream Apply 1 application topically daily.    . mupirocin ointment (BACTROBAN) 2 % Place 1 application into the nose 2 (two) times daily.    . Omega-3 Fatty Acids ([employer] FISH OIL PO) Take by mouth.    . pantoprazole (PROTONIX) 40 MG tablet Take 40 mg by mouth daily.    . TOBRAMYCIN SULFATE IN SALINE IV Inject into the vein. Tobramycin 30 mg/mL: Add 1 mL to 240 mL saline in saline irrigation bottle, irrigate sinuses with 120 mL through each nostril twice daily    . topiramate (TOPAMAX) 100 MG tablet Take 100 mg by mouth 2 (two) times daily.    Marland Kitchen warfarin (COUMADIN) 7.5 MG tablet TAKE 1 AND 1/2 TABLETS DAILY EVERY SUNDAY, TUESDAY, THURSDAY THEN TAKE 1 TABLET EVERY MONDAY, WEDNESDAY, FRIDAY AND SATUDAY 30 tablet 0   No current facility-administered medications on file prior to visit.    Allergies  Allergen Reactions  . Cefuroxime Anaphylaxis    Difficulty swallowing pills because they were too dry.  Caused tablet dysphagia.  . Diazepam Shortness Of Breath    HYPERVENTILATION  HYPERVENTILATION   . Latex Rash and Shortness Of Breath    Airway swelling  . Other Palpitations    Affected heart rate/er told her not to take again Affected heart rate/er told her not to take again  . Amlodipine Cough    Not sure  . Diclofenac Sodium Hives  . Diphenhydramine Hcl Palpitations    Affected heart rate/er told her not to take again  . Methylpyrrolidone Hives  . Ondansetron Hives and Other (See Comments)    Sedates/knocks her out Sedates/knocks her out  . Oxycodone Hives  . Penicillins  Hives and Swelling    Most mycin drugs  . Butorphanol Tartrate Hives  . Ciprofloxacin     Patient prefers not to take Fluoroquinolones  . Lisinopril Cough    Caused pt to cough  . Ramipril Cough    Pt c/o cough  . Sulfa Antibiotics   . Ace Inhibitors Other (See Comments)    Lisinopril and ramipril  . Codeine Nausea Only  . Protonix  [Pantoprazole Sodium] Diarrhea      Review of Systems:  Constitutional: No recent illness  Cardiac: No  chest pain, No  pressure, No palpitations  Respiratory:  No  shortness of breath. No  Cough  Gastrointestinal: No  abdominal pain  Musculoskeletal: No new myalgia/arthralgia  Skin: No  Rash  Hem/Onc: No  easy bruising/bleeding, No  abnormal lumps/bumps  Neurologic: No  weakness, No  Dizziness  Psychiatric: No  concerns with depression, No  concerns with anxiety  Exam:  BP (!) 161/79   Pulse (!) 55   Wt 112 lb (50.8 kg)   BMI 19.84 kg/m   Constitutional: VS see above. General Appearance: alert, well-developed, well-nourished,  NAD  Eyes: Normal lids and conjunctive, non-icteric sclera  Ears, Nose, Mouth, Throat: MMM, Normal external inspection ears/nares/mouth/lips/gums.  Neck: No masses, trachea midline.   Respiratory: Normal respiratory effort. no wheeze, no rhonchi, no rales  Cardiovascular: S1/S2 normal, no murmur, no rub/gallop auscultated. RRR.   Musculoskeletal: Gait normal. Symmetric and independent movement of all extremities  Neurological: Normal balance/coordination. No tremor.  Skin: warm, dry, intact.   Psychiatric: Normal judgment/insight. Normal mood and affect. Oriented x3.    Recent Results (from the past 2160 hour(s))  POCT INR     Status: None   Collection Time: 03/25/17  3:19 PM  Result Value Ref Range   INR 3.6   POCT INR     Status: None   Collection Time: 04/07/17 11:42 AM  Result Value Ref Range   INR 2.1   POCT INR     Status: None   Collection Time: 04/21/17  9:48 AM  Result Value Ref  Range   INR 2.8   POCT urinalysis dipstick     Status: Abnormal   Collection Time: 04/24/17 12:40 PM  Result Value Ref Range   Color, UA yellow yellow   Clarity, UA clear clear   Glucose, UA negative negative mg/dL   Bilirubin, UA negative negative   Ketones, POC UA negative negative mg/dL   Spec Grav, UA 1.015 1.010 - 1.025   Blood, UA negative negative   pH, UA 6.0 5.0 - 8.0   Protein Ur, POC trace (A) negative mg/dL   Urobilinogen, UA 0.2 0.2 or 1.0 E.U./dL   Nitrite, UA Negative Negative   Leukocytes, UA Negative Negative  POCT INR     Status: None   Collection Time: 05/03/17 12:00 AM  Result Value Ref Range   INR 2.2   POCT INR     Status: None   Collection Time: 05/20/17 10:50 AM  Result Value Ref Range   INR 2.2     No results found.  No flowsheet data found.  Depression screen PHQ 2/9 12/30/2016  Decreased Interest 0  Down, Depressed, Hopeless 0  PHQ - 2 Score 0      ASSESSMENT/PLAN:   Atrial fibrillation, unspecified type ([place]) - Plan: POCT INR, INR/PT  Benign hypertension with CKD (chronic kidney disease) stage III [facility]  Patient request for diagnostic testing - Plan: Hepatic function panel, Lipase  Pseudomonas infection  Vocal cord dysfunction  Essential hypertension - Maximum dose losartan, okay to continue hydralazine, wouldn't increase beta blocker. Cautious addition of thiazide diuretic, may need to follow-up with nephro - Plan: hydrochlorothiazide (HYDRODIURIL) 25 MG tablet  Other chronic postprocedural pain - [place] controlled substance database reviewed and consistent with history. Patient to return to clinic q3 mos for continuation of Rx - Plan: HYDROcodone-acetaminophen ([place]) 7.5-325 MG tablet  DDD (degenerative disc disease), lumbar    Patient Instructions  Plan:  Labs today  Will call with INR results and directions for Warfarin  Increase Hydrochlorothiazide to help get blood pressure to goal     Follow-up plan: Return  in about 2 weeks (around 06/28/2017) for recheck blood pressure.  Visit summary with medication list and pertinent instructions was printed for patient to review, alert [place] if any changes needed. All questions at time of visit were answered - patient instructed to contact office with any additional concerns. ER/RTC precautions were reviewed with the patient and understanding verbalized.   Note: Total time spent 25 minutes, greater than 50% of the visit was spent face-to-face counseling  and coordinating care for the following: The primary encounter diagnosis was Atrial fibrillation, unspecified type ([place]). Diagnoses of Benign hypertension with CKD (chronic kidney disease) stage III [facility]), Patient request for diagnostic testing, Pseudomonas infection, Vocal cord dysfunction, Essential hypertension, Other chronic postprocedural pain, and DDD (degenerative disc disease), lumbar were also pertinent to this visit.Marland Kitchen

## 2017-06-14 NOTE — Patient Instructions (Signed)
Plan:  Labs today  Will call with INR results and directions for Warfarin  Increase Hydrochlorothiazide to help get blood pressure to goal

## 2017-06-15 ENCOUNTER — Other Ambulatory Visit: Payer: Self-pay | Admitting: Osteopathic Medicine

## 2017-06-15 LAB — HEPATIC FUNCTION PANEL
AG RATIO: 2 (calc) (ref 1.0–2.5)
ALKALINE PHOSPHATASE (APISO): 72 U/L (ref 33–130)
ALT: 13 U/L (ref 6–29)
AST: 15 U/L (ref 10–35)
Albumin: 4 g/dL (ref 3.6–5.1)
BILIRUBIN INDIRECT: 0.2 mg/dL (ref 0.2–1.2)
BILIRUBIN TOTAL: 0.2 mg/dL (ref 0.2–1.2)
Bilirubin, Direct: 0 mg/dL (ref 0.0–0.2)
Globulin: 2 g/dL (calc) (ref 1.9–3.7)
TOTAL PROTEIN: 6 g/dL — AB (ref 6.1–8.1)

## 2017-06-15 LAB — PROTIME-INR
INR: 5.3 — AB
Prothrombin Time: 55.3 s — ABNORMAL HIGH (ref 9.0–11.5)

## 2017-06-15 LAB — LIPASE: Lipase: 53 U/L (ref 7–60)

## 2017-06-15 MED ORDER — WARFARIN SODIUM 7.5 MG PO TABS: ORAL_TABLET | ORAL | 0 refills | Status: DC

## 2017-06-15 NOTE — Progress Notes (Signed)
REDUCE WARFARIN

## 2017-06-17 ENCOUNTER — Ambulatory Visit: Payer: Medicare Other

## 2017-06-20 ENCOUNTER — Ambulatory Visit: Payer: Medicare Other | Admitting: Osteopathic Medicine

## 2017-06-20 DIAGNOSIS — R1011 Right upper quadrant pain: Secondary | ICD-10-CM | POA: Diagnosis not present

## 2017-06-22 ENCOUNTER — Ambulatory Visit (INDEPENDENT_AMBULATORY_CARE_PROVIDER_SITE_OTHER): Payer: Medicare Other | Admitting: Osteopathic Medicine

## 2017-06-22 ENCOUNTER — Ambulatory Visit: Payer: Medicare Other

## 2017-06-22 VITALS — BP 158/72 | HR 57

## 2017-06-22 DIAGNOSIS — Z7901 Long term (current) use of anticoagulants: Secondary | ICD-10-CM

## 2017-06-22 DIAGNOSIS — I480 Paroxysmal atrial fibrillation: Secondary | ICD-10-CM | POA: Diagnosis not present

## 2017-06-22 DIAGNOSIS — I10 Essential (primary) hypertension: Secondary | ICD-10-CM | POA: Diagnosis not present

## 2017-06-22 DIAGNOSIS — I824Y9 Acute embolism and thrombosis of unspecified deep veins of unspecified proximal lower extremity: Secondary | ICD-10-CM | POA: Diagnosis not present

## 2017-06-22 DIAGNOSIS — D6859 Other primary thrombophilia: Secondary | ICD-10-CM | POA: Diagnosis not present

## 2017-06-22 DIAGNOSIS — N183 Chronic kidney disease, stage 3 (moderate): Secondary | ICD-10-CM

## 2017-06-22 DIAGNOSIS — K219 Gastro-esophageal reflux disease without esophagitis: Secondary | ICD-10-CM | POA: Diagnosis not present

## 2017-06-22 DIAGNOSIS — I129 Hypertensive chronic kidney disease with stage 1 through stage 4 chronic kidney disease, or unspecified chronic kidney disease: Secondary | ICD-10-CM | POA: Diagnosis not present

## 2017-06-22 DIAGNOSIS — R197 Diarrhea, unspecified: Secondary | ICD-10-CM | POA: Diagnosis not present

## 2017-06-22 LAB — CBC WITH DIFFERENTIAL/PLATELET
BASOS PCT: 1.7 %
Basophils Absolute: 71 cells/uL (ref 0–200)
EOS PCT: 6.5 %
Eosinophils Absolute: 273 cells/uL (ref 15–500)
HCT: 33.3 % — ABNORMAL LOW (ref 35.0–45.0)
Hemoglobin: 11 g/dL — ABNORMAL LOW (ref 11.7–15.5)
Lymphs Abs: 1407 cells/uL (ref 850–3900)
MCH: 32 pg (ref 27.0–33.0)
MCHC: 33 g/dL (ref 32.0–36.0)
MCV: 96.8 fL (ref 80.0–100.0)
MPV: 10.5 fL (ref 7.5–12.5)
Monocytes Relative: 10 %
Neutro Abs: 2029 cells/uL (ref 1500–7800)
Neutrophils Relative %: 48.3 %
Platelets: 190 10*3/uL (ref 140–400)
RBC: 3.44 10*6/uL — AB (ref 3.80–5.10)
RDW: 12 % (ref 11.0–15.0)
Total Lymphocyte: 33.5 %
WBC: 4.2 10*3/uL (ref 3.8–10.8)
WBCMIX: 420 {cells}/uL (ref 200–950)

## 2017-06-22 LAB — POCT INR: INR: 3.3

## 2017-06-22 NOTE — Progress Notes (Signed)
Results for orders placed or performed in visit on 06/22/17 (from the past 72 hour(s))  POCT INR     Status: None   Collection Time: 06/22/17  3:26 PM  Result Value Ref Range   INR 3.3      BP (!) 158/72   Pulse (!) 57

## 2017-06-27 DIAGNOSIS — D649 Anemia, unspecified: Secondary | ICD-10-CM | POA: Diagnosis not present

## 2017-06-27 DIAGNOSIS — N189 Chronic kidney disease, unspecified: Secondary | ICD-10-CM | POA: Diagnosis not present

## 2017-06-27 DIAGNOSIS — Z903 Acquired absence of stomach [part of]: Secondary | ICD-10-CM | POA: Diagnosis not present

## 2017-06-27 DIAGNOSIS — K9089 Other intestinal malabsorption: Secondary | ICD-10-CM | POA: Diagnosis not present

## 2017-06-27 DIAGNOSIS — D509 Iron deficiency anemia, unspecified: Secondary | ICD-10-CM | POA: Diagnosis not present

## 2017-06-27 DIAGNOSIS — D500 Iron deficiency anemia secondary to blood loss (chronic): Secondary | ICD-10-CM | POA: Diagnosis not present

## 2017-06-27 DIAGNOSIS — K148 Other diseases of tongue: Secondary | ICD-10-CM | POA: Diagnosis not present

## 2017-06-27 DIAGNOSIS — K912 Postsurgical malabsorption, not elsewhere classified: Secondary | ICD-10-CM | POA: Diagnosis not present

## 2017-06-29 ENCOUNTER — Ambulatory Visit (INDEPENDENT_AMBULATORY_CARE_PROVIDER_SITE_OTHER): Payer: Medicare Other | Admitting: Osteopathic Medicine

## 2017-06-29 DIAGNOSIS — I10 Essential (primary) hypertension: Secondary | ICD-10-CM

## 2017-06-29 DIAGNOSIS — I824Y9 Acute embolism and thrombosis of unspecified deep veins of unspecified proximal lower extremity: Secondary | ICD-10-CM

## 2017-06-29 DIAGNOSIS — D6859 Other primary thrombophilia: Secondary | ICD-10-CM

## 2017-06-29 DIAGNOSIS — Z7901 Long term (current) use of anticoagulants: Secondary | ICD-10-CM

## 2017-06-29 DIAGNOSIS — I4891 Unspecified atrial fibrillation: Secondary | ICD-10-CM

## 2017-06-29 LAB — POCT INR: INR: 2.9

## 2017-06-29 MED ORDER — HYDROCHLOROTHIAZIDE 12.5 MG PO TABS: 12.5000 mg | ORAL_TABLET | Freq: Every day | ORAL | 1 refills | Status: DC

## 2017-06-29 NOTE — Progress Notes (Signed)
Will reduce HCTZ again - BP better but c/o dizziness. Nurse instructed to let patient know he can schedule ap with me if concerned.  INR ok, continue current dose F/u me in 2 weeks

## 2017-07-01 ENCOUNTER — Other Ambulatory Visit: Payer: Self-pay | Admitting: Osteopathic Medicine

## 2017-07-06 ENCOUNTER — Other Ambulatory Visit: Payer: Self-pay | Admitting: Osteopathic Medicine

## 2017-07-07 DIAGNOSIS — J311 Chronic nasopharyngitis: Secondary | ICD-10-CM | POA: Diagnosis not present

## 2017-07-07 DIAGNOSIS — J383 Other diseases of vocal cords: Secondary | ICD-10-CM | POA: Diagnosis not present

## 2017-07-07 DIAGNOSIS — Q825 Congenital non-neoplastic nevus: Secondary | ICD-10-CM | POA: Diagnosis not present

## 2017-07-13 ENCOUNTER — Ambulatory Visit: Payer: Medicare Other | Admitting: Osteopathic Medicine

## 2017-07-15 ENCOUNTER — Ambulatory Visit (INDEPENDENT_AMBULATORY_CARE_PROVIDER_SITE_OTHER): Payer: Medicare Other | Admitting: Osteopathic Medicine

## 2017-07-15 ENCOUNTER — Encounter: Payer: Self-pay | Admitting: Osteopathic Medicine

## 2017-07-15 VITALS — BP 157/78 | HR 64 | Wt 110.0 lb

## 2017-07-15 DIAGNOSIS — I482 Chronic atrial fibrillation, unspecified: Secondary | ICD-10-CM

## 2017-07-15 DIAGNOSIS — I129 Hypertensive chronic kidney disease with stage 1 through stage 4 chronic kidney disease, or unspecified chronic kidney disease: Secondary | ICD-10-CM | POA: Diagnosis not present

## 2017-07-15 DIAGNOSIS — G8928 Other chronic postprocedural pain: Secondary | ICD-10-CM | POA: Diagnosis not present

## 2017-07-15 DIAGNOSIS — N183 Chronic kidney disease, stage 3 (moderate): Secondary | ICD-10-CM | POA: Diagnosis not present

## 2017-07-15 MED ORDER — WARFARIN SODIUM 7.5 MG PO TABS
ORAL_TABLET | ORAL | 0 refills | Status: DC
Start: 2017-07-15 — End: 2017-11-24

## 2017-07-15 MED ORDER — HYDROCODONE-ACETAMINOPHEN 7.5-325 MG PO TABS: 1.0000 | ORAL_TABLET | Freq: Four times a day (QID) | ORAL | 0 refills | Status: DC | PRN

## 2017-07-15 NOTE — Progress Notes (Signed)
HPI: Gloria Mckay is a 76 y.o. female who  has a past medical history of Arthritis, Cancer ([place]), and Hypertension.  she presents to [facility] today, 07/15/17,  for chief complaint of:  Chief Complaint  Patient presents with  . Follow-up    BLOOD PRESSURE    HTN: addition of HCTZ 12.5 on 06/14/17 has helped BP some according to blood pressures taken at nurses visits, she is not taking the medication this morning and blood pressure is a bit on the higher side, due for labs.   Chronic kidney disease: Following with nephrology, she states she has an upcoming appointment with them in December.  Atrial fibrillation: No chest pain or palpitations, no shortness of breath. Compliant with warfarin, I updated the medication list patient states she is taking 7.5 mg 1 tablet daily   Past medical, surgical, social and family history reviewed:  Patient Active Problem List   Diagnosis Date Noted  . Pseudomonas infection 04/25/2017  . Long term (current) use of anticoagulants [Z79.01] 02/24/2017  . Primary hypercoagulable state ([place]) [D68.59] 02/24/2017  . Atrial fibrillation ([place]) [I48.91] 02/24/2017  . Acute venous embolism and thrombosis of deep vessels of proximal lower extremity ([place]) [I82.4Y9] 02/24/2017  . DDD (degenerative disc disease), cervical 02/11/2017  . Benign hypertension with CKD (chronic kidney disease) stage III ([place]) 10/28/2016  . History of arthroplasty of left knee 10/25/2016  . Plantar fasciitis, left 10/22/2016  . Fibroma of tongue 06/14/2016  . Postmenopausal osteoporosis 02/06/2016  . History of pulmonary embolism 01/30/2016  . CKD (chronic kidney disease) stage 3, GFR 30-59 ml/min (HCC) 08/19/2015  . Dry eye syndrome of both lacrimal glands 08/19/2015  . S/P cataract extraction and insertion of intraocular lens 08/19/2015  . Iron deficiency anemia due to chronic blood loss 02/21/2015  . Age-related macular degeneration 02/05/2015  .  Congenital malformation of retina 02/05/2015  . Endothelial corneal dystrophy 02/05/2015  . Lumbar spinal stenosis 05/09/2014  . Chronic atrial fibrillation ([place]) 02/26/2014  . S/P colonoscopy 01/23/2014  . S/P endoscopy 01/23/2014  . Gastric bezoar 08/06/2013  . History of MRSA infection 12/30/2012  . Long term current use of anticoagulant therapy 08/23/2011  . Mild intermittent asthma without complication 09/98/3382  . Chest pain 01/15/2010  . Esophageal reflux 11/05/2009  . DDD (degenerative disc disease), lumbar 09/19/2009  . Infection and inflammatory reaction due to internal joint prosthesis ([place]) 08/08/2009  . Vitamin D deficiency 05/12/2009  . Chronic nausea 02/12/2009  . History of DVT of lower extremity 02/12/2009  . Recurrent major depressive disorder, in partial remission ([place]) 02/12/2009    Past Surgical History:  Procedure Laterality Date  . JOINT REPLACEMENT    . STOMACH SURGERY      Social History   Tobacco Use  . Smoking status: Never Smoker  . Smokeless tobacco: Never Used  Substance Use Topics  . Alcohol use: No    No family history on file.   Current medication list and allergy/intolerance information reviewed:    Current Outpatient Medications  Medication Sig Dispense Refill  . AMBULATORY NON FORMULARY MEDICATION Home TENS unit, please use up to TID for 30 mins, this is medically necessary. 1 each 0  . amLODipine (NORVASC) 10 MG tablet Take 1 tablet (10 mg total) by mouth daily. 90 tablet 3  . carvedilol (COREG) 25 MG tablet Take 1 tablet (25 mg total) by mouth 2 (two) times daily with a meal. 90 tablet 3  . ergocalciferol (VITAMIN D2) 50000  units capsule Take 1 capsule (50,000 Units total) by mouth once a week. 8 capsule 2  . hydrALAZINE (APRESOLINE) 25 MG tablet Take 1 tablet (25 mg total) by mouth 2 (two) times daily. 180 tablet 3  . hydrochlorothiazide (HYDRODIURIL) 12.5 MG tablet Take 1 tablet (12.5 mg total) by mouth daily. 90 tablet 1  .  hydrOXYzine (ATARAX/VISTARIL) 10 MG tablet Take 10-20 mg by mouth 3 (three) times daily as needed for anxiety.    Marland Kitchen losartan (COZAAR) 100 MG tablet Take 100 mg by mouth daily.    . mometasone (ELOCON) 0.1 % cream Apply 1 application topically daily.    . mupirocin ointment (BACTROBAN) 2 % Place 1 application into the nose 2 (two) times daily.    . Omega-3 Fatty Acids ([facility] FISH OIL PO) Take by mouth.    . pantoprazole (PROTONIX) 40 MG tablet Take 40 mg by mouth daily.    . TOBRAMYCIN SULFATE IN SALINE IV Inject into the vein. Tobramycin 30 mg/mL: Add 1 mL to 240 mL saline in saline irrigation bottle, irrigate sinuses with 120 mL through each nostril twice daily    . topiramate (TOPAMAX) 100 MG tablet Take 100 mg by mouth 2 (two) times daily.    Marland Kitchen warfarin (COUMADIN) 7.5 MG tablet TAKE 1 TABLET EVERY DAY 90 tablet 0  . HYDROcodone-acetaminophen (NORCO) 7.5-325 MG tablet Take 1 tablet every 6 (six) hours as needed by mouth for moderate pain. Use sparingly. #60 for 30(thirty) days 60 tablet 0   No current facility-administered medications for this visit.     Allergies  Allergen Reactions  . Cefuroxime Anaphylaxis    Difficulty swallowing pills because they were too dry.  Caused tablet dysphagia.  . Diazepam Shortness Of Breath    HYPERVENTILATION  HYPERVENTILATION   . Latex Rash and Shortness Of Breath    Airway swelling  . Other Palpitations    Affected heart rate/er told her not to take again Affected heart rate/er told her not to take again  . Amlodipine Cough    Not sure  . Diclofenac Sodium Hives  . Diphenhydramine Hcl Palpitations    Affected heart rate/er told her not to take again  . Methylpyrrolidone Hives  . Ondansetron Hives and Other (See Comments)    Sedates/knocks her out Sedates/knocks her out  . Oxycodone Hives  . Penicillins Hives and Swelling    Most mycin drugs  . Butorphanol Tartrate Hives  . Ciprofloxacin     Patient prefers not to take Fluoroquinolones  .  Lisinopril Cough    Caused pt to cough  . Ramipril Cough    Pt c/o cough  . Sulfa Antibiotics   . Ace Inhibitors Other (See Comments)    Lisinopril and ramipril  . Codeine Nausea Only  . Protonix  [Pantoprazole Sodium] Diarrhea      Review of Systems:  Constitutional:  No  fever, no chills, No recent illness,   HEENT: No  headache, no vision change  Cardiac: No  chest pain, No  pressure, No palpitations  Respiratory:  No  shortness of breath. No  Cough  Musculoskeletal: No new myalgia/arthralgia  Exam:  BP (!) 157/78   Pulse 64   Wt 110 lb (49.9 kg)   BMI 19.49 kg/m   Constitutional: VS see above. General Appearance: alert, well-developed, well-nourished, NAD  Eyes: Normal lids and conjunctive, non-icteric sclera  Ears, Nose, Mouth, Throat: MMM, Normal external inspection ears/nares/mouth/lips/gums. T  Neck: No masses, trachea midline. No thyroid enlargement. No  tenderness/mass appreciated. No lymphadenopathy  Respiratory: Normal respiratory effort. no wheeze, no rhonchi, no rales  Cardiovascular: S1/S2 normal, no murmur, no rub/gallop auscultated. RRR.   Musculoskeletal: Gait normal.   Neurological: Normal balance/coordination. No tremor.   Skin: warm, dry, intact. No rash/ulcer.    Psychiatric: Normal judgment/insight. Normal mood and affect. Oriented x3.    ASSESSMENT/PLAN:   Benign hypertension with CKD (chronic kidney disease) stage III ([place]) - Plan: BASIC METABOLIC PANEL WITH GFR  Chronic atrial fibrillation ([place]) - Plan: Protime-INR  Other chronic postprocedural pain - [place] controlled substance database reviewed and consistent with history. Patient to return to clinic q3 mos for continuation of Rx - Plan: HYDROcodone-acetaminophen ([place]) 7.5-325 MG tablet     Visit summary with medication list and pertinent instructions was printed for patient to review. All questions at time of visit were answered - patient instructed to contact office  with any additional concerns. ER/RTC precautions were reviewed with the patient. Follow-up plan: Return in about 1 month (around 08/14/2017) for INR check (nurse visit), and 3 months checkup blood pressure with Dr Sheppard Coil .  Note: Total time spent 25 minutes, greater than 50% of the visit was spent face-to-face counseling and coordinating care for the following: The primary encounter diagnosis was Benign hypertension with CKD (chronic kidney disease) stage III ([place]). Diagnoses of Chronic atrial fibrillation (HCC) and Other chronic postprocedural pain were also pertinent to this visit.Marland Kitchen  Please note: voice recognition software was used to produce this document, and typos may escape review. Please contact Dr. Sheppard Coil for any needed clarifications.

## 2017-07-16 LAB — PROTIME-INR
INR: 2.6 — ABNORMAL HIGH
PROTHROMBIN TIME: 27 s — AB (ref 9.0–11.5)

## 2017-07-16 LAB — BASIC METABOLIC PANEL WITH GFR
BUN/Creatinine Ratio: 29 (calc) — ABNORMAL HIGH (ref 6–22)
BUN: 43 mg/dL — AB (ref 7–25)
CALCIUM: 8.5 mg/dL — AB (ref 8.6–10.4)
CO2: 20 mmol/L (ref 20–32)
CREATININE: 1.48 mg/dL — AB (ref 0.60–0.93)
Chloride: 115 mmol/L — ABNORMAL HIGH (ref 98–110)
GFR, EST AFRICAN AMERICAN: 40 mL/min/{1.73_m2} — AB (ref >=60)
GFR, EST NON AFRICAN AMERICAN: 34 mL/min/{1.73_m2} — AB (ref >=60)
Glucose, Bld: 90 mg/dL (ref 65–99)
POTASSIUM: 4.5 mmol/L (ref 3.5–5.3)
Sodium: 144 mmol/L (ref 135–146)

## 2017-07-25 DIAGNOSIS — N3941 Urge incontinence: Secondary | ICD-10-CM | POA: Diagnosis not present

## 2017-07-25 DIAGNOSIS — R32 Unspecified urinary incontinence: Secondary | ICD-10-CM | POA: Diagnosis not present

## 2017-08-03 DIAGNOSIS — E559 Vitamin D deficiency, unspecified: Secondary | ICD-10-CM | POA: Diagnosis not present

## 2017-08-03 DIAGNOSIS — K912 Postsurgical malabsorption, not elsewhere classified: Secondary | ICD-10-CM | POA: Diagnosis not present

## 2017-08-03 DIAGNOSIS — D500 Iron deficiency anemia secondary to blood loss (chronic): Secondary | ICD-10-CM | POA: Diagnosis not present

## 2017-08-03 DIAGNOSIS — N183 Chronic kidney disease, stage 3 (moderate): Secondary | ICD-10-CM | POA: Diagnosis not present

## 2017-08-03 DIAGNOSIS — Z903 Acquired absence of stomach [part of]: Secondary | ICD-10-CM | POA: Diagnosis not present

## 2017-08-12 DIAGNOSIS — Z8614 Personal history of Methicillin resistant Staphylococcus aureus infection: Secondary | ICD-10-CM | POA: Diagnosis not present

## 2017-08-12 DIAGNOSIS — Z5181 Encounter for therapeutic drug level monitoring: Secondary | ICD-10-CM | POA: Diagnosis not present

## 2017-08-12 DIAGNOSIS — I482 Chronic atrial fibrillation: Secondary | ICD-10-CM | POA: Diagnosis not present

## 2017-08-12 DIAGNOSIS — E559 Vitamin D deficiency, unspecified: Secondary | ICD-10-CM | POA: Diagnosis not present

## 2017-08-12 DIAGNOSIS — Z86711 Personal history of pulmonary embolism: Secondary | ICD-10-CM | POA: Diagnosis not present

## 2017-08-12 DIAGNOSIS — D472 Monoclonal gammopathy: Secondary | ICD-10-CM | POA: Insufficient documentation

## 2017-08-12 DIAGNOSIS — Z86718 Personal history of other venous thrombosis and embolism: Secondary | ICD-10-CM | POA: Diagnosis not present

## 2017-08-12 DIAGNOSIS — Z8719 Personal history of other diseases of the digestive system: Secondary | ICD-10-CM | POA: Diagnosis not present

## 2017-08-12 DIAGNOSIS — Z7901 Long term (current) use of anticoagulants: Secondary | ICD-10-CM | POA: Diagnosis not present

## 2017-08-12 DIAGNOSIS — D803 Selective deficiency of immunoglobulin G [IgG] subclasses: Secondary | ICD-10-CM | POA: Insufficient documentation

## 2017-08-12 DIAGNOSIS — E611 Iron deficiency: Secondary | ICD-10-CM | POA: Diagnosis not present

## 2017-08-12 DIAGNOSIS — N183 Chronic kidney disease, stage 3 (moderate): Secondary | ICD-10-CM | POA: Diagnosis not present

## 2017-08-12 DIAGNOSIS — E538 Deficiency of other specified B group vitamins: Secondary | ICD-10-CM | POA: Diagnosis not present

## 2017-08-12 DIAGNOSIS — K912 Postsurgical malabsorption, not elsewhere classified: Secondary | ICD-10-CM | POA: Diagnosis not present

## 2017-08-12 DIAGNOSIS — Z903 Acquired absence of stomach [part of]: Secondary | ICD-10-CM | POA: Diagnosis not present

## 2017-08-12 DIAGNOSIS — D500 Iron deficiency anemia secondary to blood loss (chronic): Secondary | ICD-10-CM | POA: Diagnosis not present

## 2017-08-15 ENCOUNTER — Telehealth: Payer: Self-pay | Admitting: Osteopathic Medicine

## 2017-08-15 DIAGNOSIS — G8928 Other chronic postprocedural pain: Secondary | ICD-10-CM

## 2017-08-15 MED ORDER — HYDROCODONE-ACETAMINOPHEN 7.5-325 MG PO TABS: 1.0000 | ORAL_TABLET | Freq: Four times a day (QID) | ORAL | 0 refills | Status: DC | PRN

## 2017-08-15 NOTE — Telephone Encounter (Signed)
Refill request date appropriate. Rx had been removed from med list. Routing to pcp for review and refill.

## 2017-08-15 NOTE — Telephone Encounter (Signed)
[facility]

## 2017-08-15 NOTE — Telephone Encounter (Signed)
Pt stated she needs a refill on her Hydrocodone and would like to pick it up this Thursday Dec.29th when she comes in for her INR. Thanks

## 2017-08-17 DIAGNOSIS — E870 Hyperosmolality and hypernatremia: Secondary | ICD-10-CM | POA: Diagnosis not present

## 2017-08-17 DIAGNOSIS — N183 Chronic kidney disease, stage 3 (moderate): Secondary | ICD-10-CM | POA: Diagnosis not present

## 2017-08-17 DIAGNOSIS — M7721 Periarthritis, right wrist: Secondary | ICD-10-CM | POA: Diagnosis not present

## 2017-08-17 DIAGNOSIS — I129 Hypertensive chronic kidney disease with stage 1 through stage 4 chronic kidney disease, or unspecified chronic kidney disease: Secondary | ICD-10-CM | POA: Diagnosis not present

## 2017-08-17 DIAGNOSIS — D472 Monoclonal gammopathy: Secondary | ICD-10-CM | POA: Diagnosis not present

## 2017-08-17 DIAGNOSIS — I10 Essential (primary) hypertension: Secondary | ICD-10-CM | POA: Diagnosis not present

## 2017-08-18 ENCOUNTER — Ambulatory Visit (INDEPENDENT_AMBULATORY_CARE_PROVIDER_SITE_OTHER): Payer: Medicare Other | Admitting: Osteopathic Medicine

## 2017-08-18 DIAGNOSIS — Z7901 Long term (current) use of anticoagulants: Secondary | ICD-10-CM

## 2017-08-18 DIAGNOSIS — I824Y9 Acute embolism and thrombosis of unspecified deep veins of unspecified proximal lower extremity: Secondary | ICD-10-CM

## 2017-08-18 DIAGNOSIS — D6859 Other primary thrombophilia: Secondary | ICD-10-CM | POA: Diagnosis not present

## 2017-08-18 DIAGNOSIS — I4891 Unspecified atrial fibrillation: Secondary | ICD-10-CM | POA: Diagnosis not present

## 2017-08-18 LAB — POCT INR: INR: >4.5

## 2017-08-18 LAB — PROTIME-INR
INR: 4.1 — AB
Prothrombin Time: 43.3 s — ABNORMAL HIGH (ref 9.0–11.5)

## 2017-08-18 MED ORDER — WARFARIN SODIUM 5 MG PO TABS: 5.0000 mg | ORAL_TABLET | Freq: Every day | ORAL | 1 refills | Status: DC

## 2017-08-18 NOTE — Progress Notes (Signed)
LMOM notifying pt of new dosing instructions and to recheck INR in one week.

## 2017-08-18 NOTE — Progress Notes (Signed)
Please call patient:   Skip one day Warfarin  I sent in 5 mg tablets  Starting Saturday: 5 mg tablets Saturday and Wednesday, 7.5 mg tablets Sun, Mon, Tue, Thur, Fri  Recheck INR in one week

## 2017-08-18 NOTE — Patient Instructions (Addendum)
  Please call patient:   Skip one day Warfarin  I sent in 5 mg tablets  Starting Saturday: 5 mg tablets Saturday and Wednesday, 7.5 mg tablets Sun, Mon, Tue, Thur, Fri  Recheck INR in one week

## 2017-08-31 ENCOUNTER — Ambulatory Visit (INDEPENDENT_AMBULATORY_CARE_PROVIDER_SITE_OTHER): Payer: Medicare Other | Admitting: Osteopathic Medicine

## 2017-08-31 DIAGNOSIS — Z7901 Long term (current) use of anticoagulants: Secondary | ICD-10-CM

## 2017-08-31 DIAGNOSIS — I482 Chronic atrial fibrillation, unspecified: Secondary | ICD-10-CM

## 2017-08-31 DIAGNOSIS — I824Y9 Acute embolism and thrombosis of unspecified deep veins of unspecified proximal lower extremity: Secondary | ICD-10-CM

## 2017-08-31 DIAGNOSIS — D6859 Other primary thrombophilia: Secondary | ICD-10-CM

## 2017-08-31 LAB — POCT INR: INR: 2

## 2017-08-31 NOTE — Progress Notes (Signed)
Please call patient: INR is 2.0, this is within goal range for her but I would like to recheck again in 1-2 weeks to make sure it is holding steady at current dose of medication since her INR has been so up and down lately. Any problems/questions, let us know

## 2017-09-01 NOTE — Progress Notes (Signed)
Patient advised of results and recommendations, verbalized understanding. No further inquiries during phone call.  

## 2017-09-06 DIAGNOSIS — B965 Pseudomonas (aeruginosa) (mallei) (pseudomallei) as the cause of diseases classified elsewhere: Secondary | ICD-10-CM | POA: Diagnosis not present

## 2017-09-06 DIAGNOSIS — T8459XD Infection and inflammatory reaction due to other internal joint prosthesis, subsequent encounter: Secondary | ICD-10-CM | POA: Insufficient documentation

## 2017-09-06 DIAGNOSIS — I10 Essential (primary) hypertension: Secondary | ICD-10-CM | POA: Diagnosis not present

## 2017-09-06 DIAGNOSIS — J0101 Acute recurrent maxillary sinusitis: Secondary | ICD-10-CM | POA: Diagnosis not present

## 2017-09-06 DIAGNOSIS — J411 Mucopurulent chronic bronchitis: Secondary | ICD-10-CM | POA: Diagnosis not present

## 2017-09-06 DIAGNOSIS — Z96659 Presence of unspecified artificial knee joint: Secondary | ICD-10-CM | POA: Diagnosis not present

## 2017-09-06 DIAGNOSIS — D803 Selective deficiency of immunoglobulin G [IgG] subclasses: Secondary | ICD-10-CM | POA: Diagnosis not present

## 2017-09-08 ENCOUNTER — Ambulatory Visit (INDEPENDENT_AMBULATORY_CARE_PROVIDER_SITE_OTHER): Payer: Medicare Other | Admitting: Osteopathic Medicine

## 2017-09-08 DIAGNOSIS — Z7901 Long term (current) use of anticoagulants: Secondary | ICD-10-CM | POA: Diagnosis not present

## 2017-09-08 DIAGNOSIS — I482 Chronic atrial fibrillation, unspecified: Secondary | ICD-10-CM

## 2017-09-08 DIAGNOSIS — D6859 Other primary thrombophilia: Secondary | ICD-10-CM

## 2017-09-08 DIAGNOSIS — I824Y9 Acute embolism and thrombosis of unspecified deep veins of unspecified proximal lower extremity: Secondary | ICD-10-CM

## 2017-09-08 LAB — POCT INR: INR: 3.8

## 2017-09-09 ENCOUNTER — Encounter: Payer: Self-pay | Admitting: Osteopathic Medicine

## 2017-09-09 DIAGNOSIS — H04123 Dry eye syndrome of bilateral lacrimal glands: Secondary | ICD-10-CM | POA: Diagnosis not present

## 2017-09-09 DIAGNOSIS — Q141 Congenital malformation of retina: Secondary | ICD-10-CM | POA: Diagnosis not present

## 2017-09-09 DIAGNOSIS — H353122 Nonexudative age-related macular degeneration, left eye, intermediate dry stage: Secondary | ICD-10-CM | POA: Diagnosis not present

## 2017-09-09 DIAGNOSIS — H353111 Nonexudative age-related macular degeneration, right eye, early dry stage: Secondary | ICD-10-CM | POA: Diagnosis not present

## 2017-09-13 ENCOUNTER — Ambulatory Visit (INDEPENDENT_AMBULATORY_CARE_PROVIDER_SITE_OTHER): Payer: Medicare Other | Admitting: Osteopathic Medicine

## 2017-09-13 DIAGNOSIS — J411 Mucopurulent chronic bronchitis: Secondary | ICD-10-CM | POA: Diagnosis not present

## 2017-09-13 DIAGNOSIS — D6859 Other primary thrombophilia: Secondary | ICD-10-CM

## 2017-09-13 DIAGNOSIS — Z7901 Long term (current) use of anticoagulants: Secondary | ICD-10-CM | POA: Diagnosis not present

## 2017-09-13 DIAGNOSIS — J42 Unspecified chronic bronchitis: Secondary | ICD-10-CM | POA: Diagnosis not present

## 2017-09-13 DIAGNOSIS — J342 Deviated nasal septum: Secondary | ICD-10-CM | POA: Diagnosis not present

## 2017-09-13 DIAGNOSIS — R918 Other nonspecific abnormal finding of lung field: Secondary | ICD-10-CM | POA: Diagnosis not present

## 2017-09-13 DIAGNOSIS — I824Y9 Acute embolism and thrombosis of unspecified deep veins of unspecified proximal lower extremity: Secondary | ICD-10-CM

## 2017-09-13 DIAGNOSIS — J3489 Other specified disorders of nose and nasal sinuses: Secondary | ICD-10-CM | POA: Diagnosis not present

## 2017-09-13 DIAGNOSIS — K5730 Diverticulosis of large intestine without perforation or abscess without bleeding: Secondary | ICD-10-CM | POA: Diagnosis not present

## 2017-09-13 DIAGNOSIS — I4891 Unspecified atrial fibrillation: Secondary | ICD-10-CM

## 2017-09-13 LAB — POCT INR: INR: 2

## 2017-09-13 NOTE — Progress Notes (Signed)
Plan for more frequent INR monitoring while patient is on the antibiotics.

## 2017-09-13 NOTE — Progress Notes (Signed)
Pt notified of provider's note. Currently has an appt on 09/16/17 for INR check.

## 2017-09-14 ENCOUNTER — Ambulatory Visit: Payer: Medicare Other

## 2017-09-14 ENCOUNTER — Telehealth: Payer: Self-pay | Admitting: *Deleted

## 2017-09-14 DIAGNOSIS — G8928 Other chronic postprocedural pain: Secondary | ICD-10-CM

## 2017-09-14 NOTE — Telephone Encounter (Signed)
Patient requests refill of her pain medication. See pended med

## 2017-09-15 MED ORDER — HYDROCODONE-ACETAMINOPHEN 7.5-325 MG PO TABS: 1.0000 | ORAL_TABLET | Freq: Four times a day (QID) | ORAL | 0 refills | Status: DC | PRN

## 2017-09-15 NOTE — Telephone Encounter (Signed)
Sent!

## 2017-09-15 NOTE — Telephone Encounter (Signed)
Patient notified

## 2017-09-16 ENCOUNTER — Ambulatory Visit (INDEPENDENT_AMBULATORY_CARE_PROVIDER_SITE_OTHER): Payer: Medicare Other | Admitting: Family Medicine

## 2017-09-16 DIAGNOSIS — Z7901 Long term (current) use of anticoagulants: Secondary | ICD-10-CM

## 2017-09-16 DIAGNOSIS — D6859 Other primary thrombophilia: Secondary | ICD-10-CM

## 2017-09-16 DIAGNOSIS — I824Y9 Acute embolism and thrombosis of unspecified deep veins of unspecified proximal lower extremity: Secondary | ICD-10-CM

## 2017-09-16 DIAGNOSIS — I4891 Unspecified atrial fibrillation: Secondary | ICD-10-CM

## 2017-09-16 LAB — POCT INR: INR: 1.9

## 2017-09-16 NOTE — Progress Notes (Signed)
Patient advised of recommendations.  

## 2017-09-19 DIAGNOSIS — I482 Chronic atrial fibrillation: Secondary | ICD-10-CM | POA: Diagnosis not present

## 2017-09-19 DIAGNOSIS — D803 Selective deficiency of immunoglobulin G [IgG] subclasses: Secondary | ICD-10-CM | POA: Diagnosis not present

## 2017-09-19 DIAGNOSIS — D500 Iron deficiency anemia secondary to blood loss (chronic): Secondary | ICD-10-CM | POA: Diagnosis not present

## 2017-09-19 DIAGNOSIS — E539 Vitamin B deficiency, unspecified: Secondary | ICD-10-CM | POA: Diagnosis not present

## 2017-09-19 DIAGNOSIS — E559 Vitamin D deficiency, unspecified: Secondary | ICD-10-CM | POA: Diagnosis not present

## 2017-09-19 DIAGNOSIS — D472 Monoclonal gammopathy: Secondary | ICD-10-CM | POA: Diagnosis not present

## 2017-09-20 DIAGNOSIS — M85861 Other specified disorders of bone density and structure, right lower leg: Secondary | ICD-10-CM | POA: Diagnosis not present

## 2017-09-20 DIAGNOSIS — D472 Monoclonal gammopathy: Secondary | ICD-10-CM | POA: Diagnosis not present

## 2017-09-20 DIAGNOSIS — D809 Immunodeficiency with predominantly antibody defects, unspecified: Secondary | ICD-10-CM | POA: Diagnosis not present

## 2017-09-20 DIAGNOSIS — Z7901 Long term (current) use of anticoagulants: Secondary | ICD-10-CM | POA: Diagnosis not present

## 2017-09-20 DIAGNOSIS — N189 Chronic kidney disease, unspecified: Secondary | ICD-10-CM | POA: Diagnosis not present

## 2017-09-20 DIAGNOSIS — Z5181 Encounter for therapeutic drug level monitoring: Secondary | ICD-10-CM | POA: Diagnosis not present

## 2017-09-20 DIAGNOSIS — M85831 Other specified disorders of bone density and structure, right forearm: Secondary | ICD-10-CM | POA: Diagnosis not present

## 2017-09-20 DIAGNOSIS — Z85830 Personal history of malignant neoplasm of bone: Secondary | ICD-10-CM | POA: Diagnosis not present

## 2017-09-20 DIAGNOSIS — E539 Vitamin B deficiency, unspecified: Secondary | ICD-10-CM | POA: Diagnosis not present

## 2017-09-20 DIAGNOSIS — D649 Anemia, unspecified: Secondary | ICD-10-CM | POA: Diagnosis not present

## 2017-09-20 DIAGNOSIS — M85811 Other specified disorders of bone density and structure, right shoulder: Secondary | ICD-10-CM | POA: Diagnosis not present

## 2017-09-20 DIAGNOSIS — Z9889 Other specified postprocedural states: Secondary | ICD-10-CM | POA: Diagnosis not present

## 2017-09-20 DIAGNOSIS — M85812 Other specified disorders of bone density and structure, left shoulder: Secondary | ICD-10-CM | POA: Diagnosis not present

## 2017-09-20 DIAGNOSIS — I482 Chronic atrial fibrillation: Secondary | ICD-10-CM | POA: Diagnosis not present

## 2017-09-20 DIAGNOSIS — Z903 Acquired absence of stomach [part of]: Secondary | ICD-10-CM | POA: Diagnosis not present

## 2017-09-20 DIAGNOSIS — D803 Selective deficiency of immunoglobulin G [IgG] subclasses: Secondary | ICD-10-CM | POA: Diagnosis not present

## 2017-09-20 DIAGNOSIS — D500 Iron deficiency anemia secondary to blood loss (chronic): Secondary | ICD-10-CM | POA: Diagnosis not present

## 2017-09-20 DIAGNOSIS — E559 Vitamin D deficiency, unspecified: Secondary | ICD-10-CM | POA: Diagnosis not present

## 2017-09-20 DIAGNOSIS — Z86718 Personal history of other venous thrombosis and embolism: Secondary | ICD-10-CM | POA: Diagnosis not present

## 2017-09-20 DIAGNOSIS — Z86711 Personal history of pulmonary embolism: Secondary | ICD-10-CM | POA: Diagnosis not present

## 2017-09-20 DIAGNOSIS — Z923 Personal history of irradiation: Secondary | ICD-10-CM | POA: Diagnosis not present

## 2017-09-26 DIAGNOSIS — D803 Selective deficiency of immunoglobulin G [IgG] subclasses: Secondary | ICD-10-CM | POA: Diagnosis not present

## 2017-09-26 DIAGNOSIS — E559 Vitamin D deficiency, unspecified: Secondary | ICD-10-CM | POA: Diagnosis not present

## 2017-09-26 DIAGNOSIS — I482 Chronic atrial fibrillation: Secondary | ICD-10-CM | POA: Diagnosis not present

## 2017-09-26 DIAGNOSIS — Z7901 Long term (current) use of anticoagulants: Secondary | ICD-10-CM | POA: Diagnosis not present

## 2017-09-26 DIAGNOSIS — Z86711 Personal history of pulmonary embolism: Secondary | ICD-10-CM | POA: Diagnosis not present

## 2017-09-26 DIAGNOSIS — D472 Monoclonal gammopathy: Secondary | ICD-10-CM | POA: Diagnosis not present

## 2017-09-26 DIAGNOSIS — Z86718 Personal history of other venous thrombosis and embolism: Secondary | ICD-10-CM | POA: Diagnosis not present

## 2017-09-26 DIAGNOSIS — D809 Immunodeficiency with predominantly antibody defects, unspecified: Secondary | ICD-10-CM | POA: Diagnosis not present

## 2017-09-26 DIAGNOSIS — N189 Chronic kidney disease, unspecified: Secondary | ICD-10-CM | POA: Diagnosis not present

## 2017-09-26 DIAGNOSIS — Z9889 Other specified postprocedural states: Secondary | ICD-10-CM | POA: Diagnosis not present

## 2017-09-26 DIAGNOSIS — Z5181 Encounter for therapeutic drug level monitoring: Secondary | ICD-10-CM | POA: Diagnosis not present

## 2017-09-26 DIAGNOSIS — Z85830 Personal history of malignant neoplasm of bone: Secondary | ICD-10-CM | POA: Diagnosis not present

## 2017-09-26 DIAGNOSIS — Z923 Personal history of irradiation: Secondary | ICD-10-CM | POA: Diagnosis not present

## 2017-09-26 DIAGNOSIS — E539 Vitamin B deficiency, unspecified: Secondary | ICD-10-CM | POA: Diagnosis not present

## 2017-09-26 DIAGNOSIS — D649 Anemia, unspecified: Secondary | ICD-10-CM | POA: Diagnosis not present

## 2017-09-26 DIAGNOSIS — D500 Iron deficiency anemia secondary to blood loss (chronic): Secondary | ICD-10-CM | POA: Diagnosis not present

## 2017-09-30 ENCOUNTER — Ambulatory Visit (INDEPENDENT_AMBULATORY_CARE_PROVIDER_SITE_OTHER): Payer: Medicare Other | Admitting: Family Medicine

## 2017-09-30 DIAGNOSIS — Z7901 Long term (current) use of anticoagulants: Secondary | ICD-10-CM

## 2017-09-30 DIAGNOSIS — I824Y9 Acute embolism and thrombosis of unspecified deep veins of unspecified proximal lower extremity: Secondary | ICD-10-CM

## 2017-09-30 DIAGNOSIS — I4891 Unspecified atrial fibrillation: Secondary | ICD-10-CM

## 2017-09-30 DIAGNOSIS — D6859 Other primary thrombophilia: Secondary | ICD-10-CM

## 2017-09-30 LAB — POCT INR: INR: 2

## 2017-09-30 NOTE — Progress Notes (Signed)
See anticoagulation flowsheet for adjustments to Coumadin which was too low today.

## 2017-09-30 NOTE — Progress Notes (Signed)
Left VM with dosage change .Marland Kitchen Requested callback to verify she understands.

## 2017-10-13 DIAGNOSIS — R498 Other voice and resonance disorders: Secondary | ICD-10-CM | POA: Insufficient documentation

## 2017-10-13 DIAGNOSIS — R490 Dysphonia: Secondary | ICD-10-CM | POA: Diagnosis not present

## 2017-10-14 ENCOUNTER — Ambulatory Visit (INDEPENDENT_AMBULATORY_CARE_PROVIDER_SITE_OTHER): Payer: Medicare Other | Admitting: Family Medicine

## 2017-10-14 ENCOUNTER — Other Ambulatory Visit: Payer: Self-pay | Admitting: Osteopathic Medicine

## 2017-10-14 DIAGNOSIS — G8928 Other chronic postprocedural pain: Secondary | ICD-10-CM

## 2017-10-14 DIAGNOSIS — I4891 Unspecified atrial fibrillation: Secondary | ICD-10-CM

## 2017-10-14 DIAGNOSIS — Z7901 Long term (current) use of anticoagulants: Secondary | ICD-10-CM

## 2017-10-14 DIAGNOSIS — I824Y9 Acute embolism and thrombosis of unspecified deep veins of unspecified proximal lower extremity: Secondary | ICD-10-CM

## 2017-10-14 DIAGNOSIS — D6859 Other primary thrombophilia: Secondary | ICD-10-CM

## 2017-10-14 LAB — POCT INR: INR: 2

## 2017-10-14 MED ORDER — HYDROCODONE-ACETAMINOPHEN 7.5-325 MG PO TABS
1.0000 | ORAL_TABLET | Freq: Four times a day (QID) | ORAL | 0 refills | Status: DC | PRN
Start: 2017-10-14 — End: 2017-11-10

## 2017-10-14 NOTE — Progress Notes (Signed)
See anticoag flowsheet for adjustments.

## 2017-10-14 NOTE — Progress Notes (Signed)
Pain medication refill sent.  ?

## 2017-10-17 DIAGNOSIS — E559 Vitamin D deficiency, unspecified: Secondary | ICD-10-CM | POA: Diagnosis not present

## 2017-10-17 DIAGNOSIS — D803 Selective deficiency of immunoglobulin G [IgG] subclasses: Secondary | ICD-10-CM | POA: Diagnosis not present

## 2017-10-17 DIAGNOSIS — D472 Monoclonal gammopathy: Secondary | ICD-10-CM | POA: Diagnosis not present

## 2017-10-17 DIAGNOSIS — Z903 Acquired absence of stomach [part of]: Secondary | ICD-10-CM | POA: Diagnosis not present

## 2017-10-17 NOTE — Progress Notes (Signed)
Pt advised. She was able to write down the Rx change and give correct read back. She has appt with PCP on 2/27, so she can recheck then,

## 2017-10-19 ENCOUNTER — Ambulatory Visit: Payer: Medicare Other | Admitting: Osteopathic Medicine

## 2017-10-26 ENCOUNTER — Encounter: Payer: Self-pay | Admitting: Osteopathic Medicine

## 2017-10-26 ENCOUNTER — Ambulatory Visit (INDEPENDENT_AMBULATORY_CARE_PROVIDER_SITE_OTHER): Payer: Medicare Other | Admitting: Osteopathic Medicine

## 2017-10-26 VITALS — BP 135/69 | HR 57 | Temp 97.7°F | Wt 110.1 lb

## 2017-10-26 DIAGNOSIS — I129 Hypertensive chronic kidney disease with stage 1 through stage 4 chronic kidney disease, or unspecified chronic kidney disease: Secondary | ICD-10-CM

## 2017-10-26 DIAGNOSIS — B965 Pseudomonas (aeruginosa) (mallei) (pseudomallei) as the cause of diseases classified elsewhere: Secondary | ICD-10-CM

## 2017-10-26 DIAGNOSIS — I4891 Unspecified atrial fibrillation: Secondary | ICD-10-CM | POA: Diagnosis not present

## 2017-10-26 DIAGNOSIS — N183 Chronic kidney disease, stage 3 unspecified: Secondary | ICD-10-CM

## 2017-10-26 DIAGNOSIS — Z7901 Long term (current) use of anticoagulants: Secondary | ICD-10-CM | POA: Diagnosis not present

## 2017-10-26 DIAGNOSIS — A498 Other bacterial infections of unspecified site: Secondary | ICD-10-CM

## 2017-10-26 NOTE — Progress Notes (Signed)
HPI: Gloria Mckay is a 77 y.o. female who  has a past medical history of Arthritis, Cancer ([place]), and Hypertension.  she presents to [facility] today, 10/26/17,  for chief complaint of:  Recheck BP INR needed checked    HTN: addition of HCTZ 12.5 on 06/14/17. Amlodipine 10 daily, Carvedilol 25 bid, Hydralazine 25 mg bid, Losartan 100 daily,   Chronic kidney disease: saw nephrology 08/17/18. Concern for CKD G3b/A1 d/t age and HTN nephroclerosis exacerbated by prerenal dehydration issues. Small stomach d/t post surgical changes causing sodium low. BP was 144/76 at that visit.   Atrial fibrillation: No chest pain or palpitations, no shortness of breath. Compliant with warfarin, INR unable to be done at visit on point-of-care test, we'll go ahead and draw labs. Current Coumadin dosing is daily 7.5 except thurs which is 5    Past medical, surgical, social and family history reviewed:  Patient Active Problem List   Diagnosis Date Noted  . Pseudomonas infection 04/25/2017  . Long term (current) use of anticoagulants [Z79.01] 02/24/2017  . Primary hypercoagulable state ([place]) [D68.59] 02/24/2017  . Atrial fibrillation ([place]) [I48.91] 02/24/2017  . Acute venous embolism and thrombosis of deep vessels of proximal lower extremity ([place]) [I82.4Y9] 02/24/2017  . DDD (degenerative disc disease), cervical 02/11/2017  . Benign hypertension with CKD (chronic kidney disease) stage III ([place]) 10/28/2016  . History of arthroplasty of left knee 10/25/2016  . Plantar fasciitis, left 10/22/2016  . Fibroma of tongue 06/14/2016  . Postmenopausal osteoporosis 02/06/2016  . History of pulmonary embolism 01/30/2016  . CKD (chronic kidney disease) stage 3, GFR 30-59 ml/min (HCC) 08/19/2015  . Dry eye syndrome of both lacrimal glands 08/19/2015  . S/P cataract extraction and insertion of intraocular lens 08/19/2015  . Iron deficiency anemia due to chronic blood loss 02/21/2015  .  Age-related macular degeneration 02/05/2015  . Congenital malformation of retina 02/05/2015  . Endothelial corneal dystrophy 02/05/2015  . Lumbar spinal stenosis 05/09/2014  . Chronic atrial fibrillation ([place]) 02/26/2014  . S/P colonoscopy 01/23/2014  . S/P endoscopy 01/23/2014  . Gastric bezoar 08/06/2013  . History of MRSA infection 12/30/2012  . Long term current use of anticoagulant therapy 08/23/2011  . Mild intermittent asthma without complication 96/75/9163  . Chest pain 01/15/2010  . Esophageal reflux 11/05/2009  . DDD (degenerative disc disease), lumbar 09/19/2009  . Infection and inflammatory reaction due to internal joint prosthesis ([place]) 08/08/2009  . Vitamin D deficiency 05/12/2009  . Chronic nausea 02/12/2009  . History of DVT of lower extremity 02/12/2009  . Recurrent major depressive disorder, in partial remission ([place]) 02/12/2009    Past Surgical History:  Procedure Laterality Date  . JOINT REPLACEMENT    . STOMACH SURGERY      Social History   Tobacco Use  . Smoking status: Never Smoker  . Smokeless tobacco: Never Used  Substance Use Topics  . Alcohol use: No    No family history on file.   Current medication list and allergy/intolerance information reviewed:    Current Outpatient Medications  Medication Sig Dispense Refill  . AMBULATORY NON FORMULARY MEDICATION Home TENS unit, please use up to TID for 30 mins, this is medically necessary. 1 each 0  . amLODipine (NORVASC) 10 MG tablet Take 1 tablet (10 mg total) by mouth daily. 90 tablet 3  . carvedilol (COREG) 25 MG tablet Take 1 tablet (25 mg total) by mouth 2 (two) times daily with a meal. 90 tablet 3  . hydrALAZINE (  APRESOLINE) 25 MG tablet Take 1 tablet (25 mg total) by mouth 2 (two) times daily. 180 tablet 3  . hydrochlorothiazide (HYDRODIURIL) 12.5 MG tablet Take 1 tablet (12.5 mg total) by mouth daily. 90 tablet 1  . HYDROcodone-acetaminophen (NORCO) 7.5-325 MG tablet Take 1 tablet by mouth  every 6 (six) hours as needed for moderate pain. Use sparingly. #60 for 30(thirty) days 60 tablet 0  . hydrOXYzine (ATARAX/VISTARIL) 10 MG tablet Take 10-20 mg by mouth 3 (three) times daily as needed for anxiety.    Marland Kitchen losartan (COZAAR) 100 MG tablet Take 100 mg by mouth daily.    . mometasone (ELOCON) 0.1 % cream Apply 1 application topically daily.    . mupirocin ointment (BACTROBAN) 2 % Place 1 application into the nose 2 (two) times daily.    . Omega-3 Fatty Acids ([facility] FISH OIL PO) Take by mouth.    . pantoprazole (PROTONIX) 40 MG tablet Take 40 mg by mouth daily.    Marland Kitchen topiramate (TOPAMAX) 100 MG tablet Take 100 mg by mouth 2 (two) times daily.    Marland Kitchen warfarin (COUMADIN) 5 MG tablet Take 1 tablet (5 mg total) by mouth daily. 30 tablet 1  . warfarin (COUMADIN) 7.5 MG tablet TAKE 1 TABLET EVERY DAY 90 tablet 0   No current facility-administered medications for this visit.     Allergies  Allergen Reactions  . Cefuroxime Anaphylaxis    Difficulty swallowing pills because they were too dry.  Caused tablet dysphagia.  . Diazepam Shortness Of Breath    HYPERVENTILATION  HYPERVENTILATION   . Latex Rash and Shortness Of Breath    Airway swelling  . Other Palpitations    Affected heart rate/er told her not to take again Affected heart rate/er told her not to take again  . Amlodipine Cough    Not sure  . Diclofenac Sodium Hives  . Diphenhydramine Hcl Palpitations    Affected heart rate/er told her not to take again  . Methylpyrrolidone Hives  . Ondansetron Hives and Other (See Comments)    Sedates/knocks her out Sedates/knocks her out  . Oxycodone Hives  . Penicillins Hives and Swelling    Most mycin drugs  . Butorphanol Tartrate Hives  . Ciprofloxacin     Patient prefers not to take Fluoroquinolones  . Lisinopril Cough    Caused pt to cough  . Ramipril Cough    Pt c/o cough  . Sulfa Antibiotics   . Ace Inhibitors Other (See Comments)    Lisinopril and ramipril  . Codeine  Nausea Only  . Protonix  [Pantoprazole Sodium] Diarrhea      Review of Systems:  Constitutional:  No  fever, no chills, No recent illness,   HEENT: No  headache, no vision change  Cardiac: No  chest pain, No  pressure, No palpitations  Respiratory:  No  shortness of breath. No  Cough  Musculoskeletal: No new myalgia/arthralgia  Exam:  BP 135/69   Pulse (!) 57   Temp 97.7 F (36.5 C) (Oral)   Wt 110 lb 1.9 oz (50 kg)   BMI 19.51 kg/m   Constitutional: VS see above. General Appearance: alert, well-developed, well-nourished, NAD  Eyes: Normal lids and conjunctive, non-icteric sclera  Ears, Nose, Mouth, Throat: MMM, Normal external inspection ears/nares/mouth/lips/gums. T  Neck: No masses, trachea midline. No thyroid enlargement. No tenderness/mass appreciated. No lymphadenopathy  Respiratory: Normal respiratory effort. no wheeze, no rhonchi, no rales  Cardiovascular: S1/S2 normal, no murmur, no rub/gallop auscultated. RRR.  Musculoskeletal: Gait normal.   Neurological: Normal balance/coordination. No tremor.   Skin: warm, dry, intact. No rash/ulcer.    Psychiatric: Normal judgment/insight. Normal mood and affect. Oriented x3.    ASSESSMENT/PLAN: Chronic condition stable. We spent some time talking about some of her recent visits with ENT and infectious disease., Specialists seem to have everything under control at this point, though there is certainly some concern for recent lung and sinus issues. We'll continue to monitor  Atrial fibrillation, unspecified type ([place]) - Plan: Protime-INR  Long term (current) use of anticoagulants - Plan: Protime-INR  Benign hypertension with CKD (chronic kidney disease) stage III (HCC)  Pseudomonas infection     Visit summary with medication list and pertinent instructions was printed for patient to review. All questions at time of visit were answered - patient instructed to contact office with any additional concerns. ER/RTC  precautions were reviewed with the patient.   Follow-up plan: Return in about 4 months (around 02/23/2018) for recheck BP and review chronic conditions, recheck INR sooner .  Note: Total time spent 25 minutes, greater than 50% of the visit was spent face-to-face counseling and coordinating care for the following: The primary encounter diagnosis was Atrial fibrillation, unspecified type ([place]). Diagnoses of Long term (current) use of anticoagulants, Benign hypertension with CKD (chronic kidney disease) stage III ([place]), and Pseudomonas infection were also pertinent to this visit.Marland Kitchen  Please note: voice recognition software was used to produce this document, and typos may escape review. Please contact Dr. Sheppard Coil for any needed clarifications.

## 2017-10-27 ENCOUNTER — Encounter: Payer: Self-pay | Admitting: Osteopathic Medicine

## 2017-10-27 LAB — PROTIME-INR
INR: 3.1 — AB
Prothrombin Time: 32.7 s — ABNORMAL HIGH (ref 9.0–11.5)

## 2017-10-28 DIAGNOSIS — Z8614 Personal history of Methicillin resistant Staphylococcus aureus infection: Secondary | ICD-10-CM | POA: Diagnosis not present

## 2017-10-28 DIAGNOSIS — J470 Bronchiectasis with acute lower respiratory infection: Secondary | ICD-10-CM | POA: Diagnosis not present

## 2017-10-28 DIAGNOSIS — I10 Essential (primary) hypertension: Secondary | ICD-10-CM | POA: Diagnosis not present

## 2017-10-28 DIAGNOSIS — D803 Selective deficiency of immunoglobulin G [IgG] subclasses: Secondary | ICD-10-CM | POA: Diagnosis not present

## 2017-10-31 DIAGNOSIS — D803 Selective deficiency of immunoglobulin G [IgG] subclasses: Secondary | ICD-10-CM | POA: Diagnosis not present

## 2017-10-31 DIAGNOSIS — J470 Bronchiectasis with acute lower respiratory infection: Secondary | ICD-10-CM | POA: Diagnosis not present

## 2017-10-31 DIAGNOSIS — Z8614 Personal history of Methicillin resistant Staphylococcus aureus infection: Secondary | ICD-10-CM | POA: Diagnosis not present

## 2017-11-03 ENCOUNTER — Ambulatory Visit (INDEPENDENT_AMBULATORY_CARE_PROVIDER_SITE_OTHER): Payer: Medicare Other | Admitting: Osteopathic Medicine

## 2017-11-03 DIAGNOSIS — I482 Chronic atrial fibrillation, unspecified: Secondary | ICD-10-CM

## 2017-11-03 DIAGNOSIS — D6859 Other primary thrombophilia: Secondary | ICD-10-CM

## 2017-11-03 DIAGNOSIS — Z7901 Long term (current) use of anticoagulants: Secondary | ICD-10-CM

## 2017-11-03 DIAGNOSIS — I824Y9 Acute embolism and thrombosis of unspecified deep veins of unspecified proximal lower extremity: Secondary | ICD-10-CM

## 2017-11-03 LAB — POCT INR: INR: 4.4

## 2017-11-03 NOTE — Progress Notes (Signed)
Decrease dose. Skip one dose tonight. Restart at 7.5mg  every day but on Monday Thursday.  Take 5 mg on Monday and Thursdays and recheck level in 7-8 days. I called patient to inform her of plan

## 2017-11-08 DIAGNOSIS — I10 Essential (primary) hypertension: Secondary | ICD-10-CM | POA: Diagnosis not present

## 2017-11-08 DIAGNOSIS — J22 Unspecified acute lower respiratory infection: Secondary | ICD-10-CM | POA: Diagnosis not present

## 2017-11-08 DIAGNOSIS — D500 Iron deficiency anemia secondary to blood loss (chronic): Secondary | ICD-10-CM | POA: Diagnosis not present

## 2017-11-08 DIAGNOSIS — D638 Anemia in other chronic diseases classified elsewhere: Secondary | ICD-10-CM | POA: Insufficient documentation

## 2017-11-08 DIAGNOSIS — G43819 Other migraine, intractable, without status migrainosus: Secondary | ICD-10-CM | POA: Diagnosis not present

## 2017-11-08 DIAGNOSIS — E559 Vitamin D deficiency, unspecified: Secondary | ICD-10-CM | POA: Diagnosis not present

## 2017-11-08 DIAGNOSIS — J470 Bronchiectasis with acute lower respiratory infection: Secondary | ICD-10-CM | POA: Diagnosis not present

## 2017-11-08 DIAGNOSIS — D472 Monoclonal gammopathy: Secondary | ICD-10-CM | POA: Diagnosis not present

## 2017-11-10 ENCOUNTER — Ambulatory Visit (INDEPENDENT_AMBULATORY_CARE_PROVIDER_SITE_OTHER): Payer: Medicare Other | Admitting: Osteopathic Medicine

## 2017-11-10 DIAGNOSIS — I824Y9 Acute embolism and thrombosis of unspecified deep veins of unspecified proximal lower extremity: Secondary | ICD-10-CM

## 2017-11-10 DIAGNOSIS — D6859 Other primary thrombophilia: Secondary | ICD-10-CM

## 2017-11-10 DIAGNOSIS — Z7901 Long term (current) use of anticoagulants: Secondary | ICD-10-CM

## 2017-11-10 DIAGNOSIS — I482 Chronic atrial fibrillation, unspecified: Secondary | ICD-10-CM

## 2017-11-10 DIAGNOSIS — G8928 Other chronic postprocedural pain: Secondary | ICD-10-CM | POA: Diagnosis not present

## 2017-11-10 LAB — POCT INR: INR: 2.8

## 2017-11-10 MED ORDER — HYDROCODONE-ACETAMINOPHEN 7.5-325 MG PO TABS: 1.0000 | ORAL_TABLET | Freq: Four times a day (QID) | ORAL | 0 refills | Status: DC | PRN

## 2017-11-11 NOTE — Progress Notes (Signed)
Pt advised. Follow up sent.

## 2017-11-14 DIAGNOSIS — R32 Unspecified urinary incontinence: Secondary | ICD-10-CM | POA: Diagnosis not present

## 2017-11-14 DIAGNOSIS — N3941 Urge incontinence: Secondary | ICD-10-CM | POA: Diagnosis not present

## 2017-11-16 ENCOUNTER — Telehealth: Payer: Self-pay

## 2017-11-16 NOTE — Telephone Encounter (Signed)
[employer] m/o service requesting med refill for losartan. It appears rx was written by a historical provider. Pls advise if refill is appropriate.

## 2017-11-17 MED ORDER — LOSARTAN POTASSIUM 100 MG PO TABS: 100.0000 mg | ORAL_TABLET | Freq: Every day | ORAL | 1 refills | Status: DC

## 2017-11-17 NOTE — Telephone Encounter (Signed)
OK to refill

## 2017-11-17 NOTE — Telephone Encounter (Signed)
Rx sent 

## 2017-11-24 ENCOUNTER — Ambulatory Visit (INDEPENDENT_AMBULATORY_CARE_PROVIDER_SITE_OTHER): Payer: Medicare Other | Admitting: Sports Medicine

## 2017-11-24 ENCOUNTER — Other Ambulatory Visit: Payer: Self-pay | Admitting: Osteopathic Medicine

## 2017-11-24 DIAGNOSIS — I129 Hypertensive chronic kidney disease with stage 1 through stage 4 chronic kidney disease, or unspecified chronic kidney disease: Secondary | ICD-10-CM | POA: Diagnosis not present

## 2017-11-24 DIAGNOSIS — D6859 Other primary thrombophilia: Secondary | ICD-10-CM | POA: Diagnosis not present

## 2017-11-24 DIAGNOSIS — I4891 Unspecified atrial fibrillation: Secondary | ICD-10-CM | POA: Diagnosis not present

## 2017-11-24 DIAGNOSIS — N183 Chronic kidney disease, stage 3 unspecified: Secondary | ICD-10-CM

## 2017-11-24 DIAGNOSIS — Z7901 Long term (current) use of anticoagulants: Secondary | ICD-10-CM

## 2017-11-24 LAB — POCT INR: INR: 2.6

## 2017-11-24 NOTE — Progress Notes (Signed)
HPI: Patient is here to have her coumadin levels checked. Coumadin Level on 11/10/17 -  2.8.  Assessment and Plan: Blood pressure readings are not within normal limits. Patient states she is in pain today - Right knee, Left shoulder, Left leg -  which could be why her blood pressure is elevated. Reviewed with provider - patient advised to continue recording/monitoring her readings at home - if readings remain elevated she will need to schedule a follow up office visit.

## 2017-11-24 NOTE — Assessment & Plan Note (Signed)
Check blood pressures at home over the next week or 2, and if continue to be elevated she needs a nurse visit for augmentation of occasion dosages, on review of flow sheets her blood pressures have for the most part been acceptable.

## 2017-11-24 NOTE — Progress Notes (Signed)
Called patient - LMOVM in detail of provider notation. Advised to contact our office if she has any questions.

## 2017-11-24 NOTE — Assessment & Plan Note (Signed)
Continue current Coumadin dosing, return in 2 weeks.

## 2017-12-02 DIAGNOSIS — Z9889 Other specified postprocedural states: Secondary | ICD-10-CM | POA: Diagnosis not present

## 2017-12-02 DIAGNOSIS — R197 Diarrhea, unspecified: Secondary | ICD-10-CM | POA: Diagnosis not present

## 2017-12-02 DIAGNOSIS — R1030 Lower abdominal pain, unspecified: Secondary | ICD-10-CM | POA: Diagnosis not present

## 2017-12-02 DIAGNOSIS — I10 Essential (primary) hypertension: Secondary | ICD-10-CM | POA: Diagnosis not present

## 2017-12-09 ENCOUNTER — Telehealth: Payer: Self-pay | Admitting: Osteopathic Medicine

## 2017-12-09 DIAGNOSIS — G8928 Other chronic postprocedural pain: Secondary | ICD-10-CM

## 2017-12-09 MED ORDER — HYDROCODONE-ACETAMINOPHEN 7.5-325 MG PO TABS: 1.0000 | ORAL_TABLET | Freq: Four times a day (QID) | ORAL | 0 refills | Status: DC | PRN

## 2017-12-09 NOTE — Telephone Encounter (Signed)
Forwarding to provider for review regarding med refill.

## 2017-12-09 NOTE — Telephone Encounter (Signed)
Patient requesting her Hydrocodone be called into her pharmacy

## 2017-12-09 NOTE — Telephone Encounter (Signed)
[facility]

## 2017-12-09 NOTE — Telephone Encounter (Signed)
Pt advised.

## 2017-12-13 ENCOUNTER — Ambulatory Visit (INDEPENDENT_AMBULATORY_CARE_PROVIDER_SITE_OTHER): Payer: Medicare Other | Admitting: Osteopathic Medicine

## 2017-12-13 DIAGNOSIS — D6859 Other primary thrombophilia: Secondary | ICD-10-CM

## 2017-12-13 DIAGNOSIS — Z7901 Long term (current) use of anticoagulants: Secondary | ICD-10-CM | POA: Diagnosis not present

## 2017-12-13 DIAGNOSIS — I824Y9 Acute embolism and thrombosis of unspecified deep veins of unspecified proximal lower extremity: Secondary | ICD-10-CM

## 2017-12-13 DIAGNOSIS — I4891 Unspecified atrial fibrillation: Secondary | ICD-10-CM

## 2017-12-13 LAB — POCT INR: INR: 1.4

## 2017-12-13 NOTE — Progress Notes (Signed)
Increase to 5 mg Thurs only, 7.5 mg all other days

## 2017-12-13 NOTE — Progress Notes (Signed)
Pt has been advised.

## 2017-12-14 DIAGNOSIS — T8459XD Infection and inflammatory reaction due to other internal joint prosthesis, subsequent encounter: Secondary | ICD-10-CM | POA: Diagnosis not present

## 2017-12-14 DIAGNOSIS — R05 Cough: Secondary | ICD-10-CM | POA: Diagnosis not present

## 2017-12-14 DIAGNOSIS — D803 Selective deficiency of immunoglobulin G [IgG] subclasses: Secondary | ICD-10-CM | POA: Diagnosis not present

## 2017-12-14 DIAGNOSIS — I10 Essential (primary) hypertension: Secondary | ICD-10-CM | POA: Diagnosis not present

## 2017-12-14 DIAGNOSIS — Z96659 Presence of unspecified artificial knee joint: Secondary | ICD-10-CM | POA: Diagnosis not present

## 2017-12-14 DIAGNOSIS — Z903 Acquired absence of stomach [part of]: Secondary | ICD-10-CM | POA: Diagnosis not present

## 2017-12-14 DIAGNOSIS — D472 Monoclonal gammopathy: Secondary | ICD-10-CM | POA: Diagnosis not present

## 2017-12-14 DIAGNOSIS — K219 Gastro-esophageal reflux disease without esophagitis: Secondary | ICD-10-CM | POA: Diagnosis not present

## 2017-12-14 DIAGNOSIS — R9389 Abnormal findings on diagnostic imaging of other specified body structures: Secondary | ICD-10-CM | POA: Diagnosis not present

## 2017-12-20 ENCOUNTER — Ambulatory Visit (INDEPENDENT_AMBULATORY_CARE_PROVIDER_SITE_OTHER): Payer: Medicare Other | Admitting: Osteopathic Medicine

## 2017-12-20 DIAGNOSIS — I824Y9 Acute embolism and thrombosis of unspecified deep veins of unspecified proximal lower extremity: Secondary | ICD-10-CM | POA: Diagnosis not present

## 2017-12-20 DIAGNOSIS — I4891 Unspecified atrial fibrillation: Secondary | ICD-10-CM

## 2017-12-20 DIAGNOSIS — D6859 Other primary thrombophilia: Secondary | ICD-10-CM

## 2017-12-20 DIAGNOSIS — Z7901 Long term (current) use of anticoagulants: Secondary | ICD-10-CM | POA: Diagnosis not present

## 2017-12-20 LAB — POCT INR: INR: 1.8

## 2017-12-20 NOTE — Progress Notes (Signed)
Increase to 7.5 mg daily and recheck INR one week

## 2017-12-20 NOTE — Progress Notes (Signed)
Left update on Pt's VM. Requested her provide callback to ensure correct understanding.

## 2017-12-21 NOTE — Progress Notes (Signed)
Left second VM for Pt regarding Rx dosage change.

## 2017-12-26 ENCOUNTER — Other Ambulatory Visit: Payer: Self-pay | Admitting: Osteopathic Medicine

## 2017-12-27 ENCOUNTER — Ambulatory Visit: Payer: Medicare Other

## 2017-12-28 ENCOUNTER — Ambulatory Visit (INDEPENDENT_AMBULATORY_CARE_PROVIDER_SITE_OTHER): Payer: Medicare Other | Admitting: Osteopathic Medicine

## 2017-12-28 DIAGNOSIS — Z7901 Long term (current) use of anticoagulants: Secondary | ICD-10-CM

## 2017-12-28 DIAGNOSIS — D6859 Other primary thrombophilia: Secondary | ICD-10-CM

## 2017-12-28 DIAGNOSIS — I4891 Unspecified atrial fibrillation: Secondary | ICD-10-CM

## 2017-12-28 DIAGNOSIS — I824Y9 Acute embolism and thrombosis of unspecified deep veins of unspecified proximal lower extremity: Secondary | ICD-10-CM

## 2017-12-28 LAB — POCT INR: INR: 4.2

## 2017-12-28 NOTE — Progress Notes (Signed)
Very labile INR values despite consistently taking medicines as directed, no dietary changes. Will reduce dose again to 7.5 mg daily except 5 mg Thursdays and see how this does, recheck 1 week. If INR normal then, would not wait 4 weeks to check it again, would bring her back in 1-2 weeks for recheck

## 2018-01-04 ENCOUNTER — Ambulatory Visit (INDEPENDENT_AMBULATORY_CARE_PROVIDER_SITE_OTHER): Payer: Medicare Other | Admitting: Physician Assistant

## 2018-01-04 DIAGNOSIS — I4891 Unspecified atrial fibrillation: Secondary | ICD-10-CM

## 2018-01-04 DIAGNOSIS — I824Y9 Acute embolism and thrombosis of unspecified deep veins of unspecified proximal lower extremity: Secondary | ICD-10-CM

## 2018-01-04 DIAGNOSIS — D6859 Other primary thrombophilia: Secondary | ICD-10-CM

## 2018-01-04 DIAGNOSIS — Z7901 Long term (current) use of anticoagulants: Secondary | ICD-10-CM

## 2018-01-04 LAB — POCT INR: INR: 3.7

## 2018-01-04 NOTE — Progress Notes (Signed)
Pt advised of Rx dosage change. Follow up appt made.

## 2018-01-04 NOTE — Progress Notes (Signed)
Decrease dose. Take 5 mg on Thursday and Saturday. Continue 7.5 mg on all other days

## 2018-01-05 DIAGNOSIS — D500 Iron deficiency anemia secondary to blood loss (chronic): Secondary | ICD-10-CM | POA: Diagnosis not present

## 2018-01-05 DIAGNOSIS — D638 Anemia in other chronic diseases classified elsewhere: Secondary | ICD-10-CM | POA: Diagnosis not present

## 2018-01-05 DIAGNOSIS — D472 Monoclonal gammopathy: Secondary | ICD-10-CM | POA: Diagnosis not present

## 2018-01-05 DIAGNOSIS — I129 Hypertensive chronic kidney disease with stage 1 through stage 4 chronic kidney disease, or unspecified chronic kidney disease: Secondary | ICD-10-CM | POA: Diagnosis not present

## 2018-01-05 DIAGNOSIS — N183 Chronic kidney disease, stage 3 (moderate): Secondary | ICD-10-CM | POA: Diagnosis not present

## 2018-01-05 DIAGNOSIS — M62838 Other muscle spasm: Secondary | ICD-10-CM | POA: Diagnosis not present

## 2018-01-05 DIAGNOSIS — E870 Hyperosmolality and hypernatremia: Secondary | ICD-10-CM | POA: Diagnosis not present

## 2018-01-09 ENCOUNTER — Telehealth: Payer: Self-pay | Admitting: Osteopathic Medicine

## 2018-01-09 DIAGNOSIS — G8928 Other chronic postprocedural pain: Secondary | ICD-10-CM

## 2018-01-09 NOTE — Telephone Encounter (Signed)
Forwarding to provider for review.

## 2018-01-10 MED ORDER — HYDROCODONE-ACETAMINOPHEN 7.5-325 MG PO TABS: 1.0000 | ORAL_TABLET | Freq: Four times a day (QID) | ORAL | 0 refills | Status: DC | PRN

## 2018-01-10 NOTE — Telephone Encounter (Signed)
[facility]

## 2018-01-10 NOTE — Telephone Encounter (Signed)
Pt has been updated.  

## 2018-01-11 ENCOUNTER — Ambulatory Visit: Payer: Medicare Other

## 2018-01-11 DIAGNOSIS — K219 Gastro-esophageal reflux disease without esophagitis: Secondary | ICD-10-CM | POA: Diagnosis not present

## 2018-01-11 DIAGNOSIS — D803 Selective deficiency of immunoglobulin G [IgG] subclasses: Secondary | ICD-10-CM | POA: Diagnosis not present

## 2018-01-11 DIAGNOSIS — J470 Bronchiectasis with acute lower respiratory infection: Secondary | ICD-10-CM | POA: Diagnosis not present

## 2018-01-11 DIAGNOSIS — T17908S Unspecified foreign body in respiratory tract, part unspecified causing other injury, sequela: Secondary | ICD-10-CM | POA: Diagnosis not present

## 2018-01-11 DIAGNOSIS — Z96659 Presence of unspecified artificial knee joint: Secondary | ICD-10-CM | POA: Diagnosis not present

## 2018-01-11 DIAGNOSIS — T8459XD Infection and inflammatory reaction due to other internal joint prosthesis, subsequent encounter: Secondary | ICD-10-CM | POA: Diagnosis not present

## 2018-01-11 DIAGNOSIS — N183 Chronic kidney disease, stage 3 (moderate): Secondary | ICD-10-CM | POA: Diagnosis not present

## 2018-01-11 DIAGNOSIS — D472 Monoclonal gammopathy: Secondary | ICD-10-CM | POA: Diagnosis not present

## 2018-01-11 DIAGNOSIS — R05 Cough: Secondary | ICD-10-CM | POA: Diagnosis not present

## 2018-01-11 DIAGNOSIS — R490 Dysphonia: Secondary | ICD-10-CM | POA: Diagnosis not present

## 2018-01-12 ENCOUNTER — Ambulatory Visit (INDEPENDENT_AMBULATORY_CARE_PROVIDER_SITE_OTHER): Payer: Medicare Other | Admitting: Osteopathic Medicine

## 2018-01-12 DIAGNOSIS — D6859 Other primary thrombophilia: Secondary | ICD-10-CM

## 2018-01-12 DIAGNOSIS — I824Y9 Acute embolism and thrombosis of unspecified deep veins of unspecified proximal lower extremity: Secondary | ICD-10-CM

## 2018-01-12 DIAGNOSIS — I4891 Unspecified atrial fibrillation: Secondary | ICD-10-CM

## 2018-01-12 DIAGNOSIS — Z7901 Long term (current) use of anticoagulants: Secondary | ICD-10-CM

## 2018-01-12 LAB — POCT INR: INR: 3.1

## 2018-01-12 NOTE — Progress Notes (Signed)
Given labile INR, this is pretty close to goal, let's just recheck in one week

## 2018-01-18 ENCOUNTER — Other Ambulatory Visit: Payer: Self-pay | Admitting: Osteopathic Medicine

## 2018-01-19 ENCOUNTER — Ambulatory Visit (INDEPENDENT_AMBULATORY_CARE_PROVIDER_SITE_OTHER): Payer: Medicare Other | Admitting: Osteopathic Medicine

## 2018-01-19 ENCOUNTER — Ambulatory Visit (INDEPENDENT_AMBULATORY_CARE_PROVIDER_SITE_OTHER): Payer: Medicare Other

## 2018-01-19 VITALS — BP 159/75 | HR 63

## 2018-01-19 DIAGNOSIS — D6859 Other primary thrombophilia: Secondary | ICD-10-CM | POA: Diagnosis not present

## 2018-01-19 DIAGNOSIS — S79922A Unspecified injury of left thigh, initial encounter: Secondary | ICD-10-CM | POA: Diagnosis not present

## 2018-01-19 DIAGNOSIS — I4891 Unspecified atrial fibrillation: Secondary | ICD-10-CM

## 2018-01-19 DIAGNOSIS — I824Y9 Acute embolism and thrombosis of unspecified deep veins of unspecified proximal lower extremity: Secondary | ICD-10-CM | POA: Diagnosis not present

## 2018-01-19 DIAGNOSIS — M79662 Pain in left lower leg: Secondary | ICD-10-CM | POA: Diagnosis not present

## 2018-01-19 DIAGNOSIS — Z7901 Long term (current) use of anticoagulants: Secondary | ICD-10-CM

## 2018-01-19 DIAGNOSIS — M79605 Pain in left leg: Secondary | ICD-10-CM

## 2018-01-19 DIAGNOSIS — M79652 Pain in left thigh: Secondary | ICD-10-CM | POA: Diagnosis not present

## 2018-01-19 DIAGNOSIS — S8992XA Unspecified injury of left lower leg, initial encounter: Secondary | ICD-10-CM | POA: Diagnosis not present

## 2018-01-19 LAB — POCT INR: INR: 3.3 — AB (ref 2.0–3.0)

## 2018-01-19 NOTE — Progress Notes (Signed)
HPI: Gloria Mckay is a 77 y.o. female who  has a past medical history of Arthritis, Cancer ([place]), and Hypertension.  she presents to [facility] today, 01/19/18,  for chief complaint of:  INR check Fall last week, concerned about leg pain  . Fall, leg pain: "sat down" on the steps last week to avoid full falling, bruise on L buttock and L leg pain now, she declined XR last week but now is worried about leg pain given surgical history and she is worried hardware came loose.  . Ear pain: chronic congestion and sinusitis, following with ENT, requests ear check today. No hearing loss, no fever or sore throat or cough.  . INR out of range, mild supra-therapeutic today, no rectal or gums bleeding.    Past medical history, surgical history, and family history reviewed.  Current medication list and allergy/intolerance information reviewed.   (See remainder of HPI, ROS, Phys Exam below)  No results found.  Results for orders placed or performed in visit on 01/19/18 (from the past 72 hour(s))  POCT INR     Status: Abnormal   Collection Time: 01/19/18  1:56 PM  Result Value Ref Range   INR 3.3 (A) 2.0 - 3.0     ASSESSMENT/PLAN:   Hx DVT, Afib: supratherapeutic INR today, reduce dose and recheck next week  Leg pain: fracture seems unlikely, she's been ambulating relatively ok for the past week, will get XR and pt is advised to f/u w/ ortho, will arrange urgent visit if XR abn.   Ear pain: advise continue claritin, add Flonase/Nasonex and follow up with ENT< no infection now  HTN: recheck next week, was okay last week, pt reports pain today.    Follow-up plan: Return in about 1 week (around 01/26/2018) for INR and BP recheck .     ############################################ ############################################ ############################################ ############################################    Outpatient Encounter Medications as  of 01/19/2018  Medication Sig Note  . AMBULATORY NON FORMULARY MEDICATION Home TENS unit, please use up to TID for 30 mins, this is medically necessary.   Marland Kitchen amLODipine (NORVASC) 10 MG tablet Take 1 tablet (10 mg total) by mouth daily.   . carvedilol (COREG) 25 MG tablet TAKE 1 TABLET TWICE DAILY WITH MEALS   . Cholecalciferol (VITAMIN D3) 50000 units CAPS Take 1 tablet by mouth daily.   . hydrALAZINE (APRESOLINE) 25 MG tablet Take 1 tablet (25 mg total) by mouth 2 (two) times daily.   . hydrochlorothiazide (HYDRODIURIL) 12.5 MG tablet Take 1 tablet (12.5 mg total) by mouth daily.   Marland Kitchen HYDROcodone-acetaminophen (NORCO) 7.5-325 MG tablet Take 1 tablet by mouth every 6 (six) hours as needed for moderate pain. Use sparingly. #60 for 30(thirty) days   . hydrOXYzine (ATARAX/VISTARIL) 10 MG tablet Take 10-20 mg by mouth 3 (three) times daily as needed for anxiety.   Marland Kitchen losartan (COZAAR) 100 MG tablet Take 1 tablet (100 mg total) by mouth daily.   . mometasone (ELOCON) 0.1 % cream Apply 1 application topically daily.   . mupirocin ointment (BACTROBAN) 2 % Place 1 application into the nose 2 (two) times daily.   . Omega-3 Fatty Acids ([employer] FISH OIL PO) Take by mouth.   . topiramate (TOPAMAX) 100 MG tablet Take 100 mg by mouth 2 (two) times daily. 04/21/2017: Taking 1 and 1/2 tablets AM, 2 tablets PM  . warfarin (COUMADIN) 5 MG tablet TAKE 1 TABLET BY MOUTH EVERY DAY   . warfarin (COUMADIN) 7.5 MG tablet  TAKE 1 AND 1/2 TABLETS DAILY EVERY SUNDAY, TUESDAY, THURSDAY THEN TAKE 1 TABLET EVERY MONDAY, WEDNESDAY, FRIDAY AND SATURDAY    No facility-administered encounter medications on file as of 01/19/2018.    Allergies  Allergen Reactions  . Cefuroxime Anaphylaxis    Difficulty swallowing pills because they were too dry.  Caused tablet dysphagia.  . Diazepam Shortness Of Breath    HYPERVENTILATION  HYPERVENTILATION   . Latex Rash and Shortness Of Breath    Airway swelling  . Other Palpitations    Affected  heart rate/er told her not to take again Affected heart rate/er told her not to take again  . Amlodipine Cough    Not sure  . Diclofenac Sodium Hives  . Diphenhydramine Hcl Palpitations    Affected heart rate/er told her not to take again  . Methylpyrrolidone Hives  . Ondansetron Hives and Other (See Comments)    Sedates/knocks her out Sedates/knocks her out  . Oxycodone Hives  . Penicillins Hives and Swelling    Most mycin drugs  . Butorphanol Tartrate Hives  . Ciprofloxacin     Patient prefers not to take Fluoroquinolones  . Lisinopril Cough    Caused pt to cough  . Ramipril Cough    Pt c/o cough  . Sulfa Antibiotics   . Ace Inhibitors Other (See Comments)    Lisinopril and ramipril  . Codeine Nausea Only  . Protonix  [Pantoprazole Sodium] Diarrhea      Review of Systems:  Constitutional: No recent illness  HEENT: No  headache, no vision change, +L ear pain as per HPI   Cardiac: No  chest pain, No  pressure, No palpitations  Respiratory:  No  shortness of breath. No  Cough  Gastrointestinal: No  abdominal pain, no change on bowel habits  Musculoskeletal: +new myalgia/arthralgia  Skin: No  Rash  Hem/Onc: +easy bruising/bleeding, No  abnormal lumps/bumps   Exam:  BP (!) 159/75   Pulse 63   Constitutional: VS see above. General Appearance: alert, well-developed, well-nourished, NAD  Eyes: Normal lids and conjunctive, non-icteric sclera  Ears, Nose, Mouth, Throat: MMM, Normal external inspection ears/nares/mouth/lips/gums. TM normal w/ clear effusion behind L TM.   Neck: No masses, trachea midline.   Respiratory: Normal respiratory effort. no wheeze, no rhonchi, no rales  Cardiovascular: S1/S2 normal, no murmur, no rub/gallop auscultated. RRR.   Musculoskeletal: Gait steady. Symmetric and independent movement of all extremities, no effusion of L knee or ankle, no edema, no ecchymoses. Tender to lateral L ankle.   Neurological: Normal  balance/coordination. No tremor.  Skin: warm, dry, intact. Expected, mild bruising L buttock c/w dropping onto stairs   Psychiatric: Normal judgment/insight. Normal mood and affect. Oriented x3.   Visit summary with medication list and pertinent instructions was printed for patient to review, advised to alert [place] if any changes needed. All questions at time of visit were answered - patient instructed to contact office with any additional concerns. ER/RTC precautions were reviewed with the patient and understanding verbalized.   Follow-up plan: Return in about 1 week (around 01/26/2018) for INR and BP recheck .    Please note: voice recognition software was used to produce this document, and typos may escape review. Please contact Dr. Sheppard Coil for any needed clarifications.

## 2018-01-26 ENCOUNTER — Ambulatory Visit (INDEPENDENT_AMBULATORY_CARE_PROVIDER_SITE_OTHER): Payer: Medicare Other | Admitting: Osteopathic Medicine

## 2018-01-26 DIAGNOSIS — I824Y9 Acute embolism and thrombosis of unspecified deep veins of unspecified proximal lower extremity: Secondary | ICD-10-CM

## 2018-01-26 DIAGNOSIS — Z7901 Long term (current) use of anticoagulants: Secondary | ICD-10-CM

## 2018-01-26 DIAGNOSIS — D6859 Other primary thrombophilia: Secondary | ICD-10-CM

## 2018-01-26 DIAGNOSIS — I4891 Unspecified atrial fibrillation: Secondary | ICD-10-CM

## 2018-01-26 LAB — POCT INR: INR: 2.4 (ref 2.0–3.0)

## 2018-01-26 NOTE — Progress Notes (Signed)
Results for orders placed or performed in visit on 01/26/18 (from the past 24 hour(s))  POCT INR     Status: None   Collection Time: 01/26/18  1:36 PM  Result Value Ref Range   INR 2.4 2.0 - 3.0   BP 136/64   Pulse (!) 56   LOOKING GOOD! Plan to recheck in 2 weeks since her INR has been all over the place

## 2018-01-27 DIAGNOSIS — R1031 Right lower quadrant pain: Secondary | ICD-10-CM | POA: Diagnosis not present

## 2018-02-07 ENCOUNTER — Other Ambulatory Visit: Payer: Self-pay

## 2018-02-07 DIAGNOSIS — G8928 Other chronic postprocedural pain: Secondary | ICD-10-CM

## 2018-02-07 MED ORDER — HYDROCODONE-ACETAMINOPHEN 7.5-325 MG PO TABS: 1.0000 | ORAL_TABLET | Freq: Four times a day (QID) | ORAL | 0 refills | Status: DC | PRN

## 2018-02-07 NOTE — Telephone Encounter (Signed)
Pt called to remind Dr Sheppard Coil that she is coming due for her Norco refill.   Last RX written 01-10-18 for 30 days.   Pt states she has enough to last until the 14th, but she just wanted to remind Dr Sheppard Coil so that she does not run out over the weekend  RX pended. Note to Dr Sheppard Coil

## 2018-02-09 ENCOUNTER — Ambulatory Visit: Payer: Medicare Other

## 2018-02-10 ENCOUNTER — Ambulatory Visit (INDEPENDENT_AMBULATORY_CARE_PROVIDER_SITE_OTHER): Payer: Medicare Other | Admitting: Sports Medicine

## 2018-02-10 DIAGNOSIS — Z7901 Long term (current) use of anticoagulants: Secondary | ICD-10-CM | POA: Diagnosis not present

## 2018-02-10 DIAGNOSIS — D6859 Other primary thrombophilia: Secondary | ICD-10-CM

## 2018-02-10 DIAGNOSIS — I4891 Unspecified atrial fibrillation: Secondary | ICD-10-CM

## 2018-02-10 DIAGNOSIS — I824Y9 Acute embolism and thrombosis of unspecified deep veins of unspecified proximal lower extremity: Secondary | ICD-10-CM

## 2018-02-10 LAB — POCT INR: INR: 2.4 (ref 2.0–3.0)

## 2018-02-10 NOTE — Progress Notes (Signed)
Patient advised of recommendations.  

## 2018-02-10 NOTE — Progress Notes (Signed)
INR therapeutic, no changes in dose, recheck in 4 weeks.

## 2018-02-11 ENCOUNTER — Emergency Department (INDEPENDENT_AMBULATORY_CARE_PROVIDER_SITE_OTHER)
Admission: EM | Admit: 2018-02-11 | Discharge: 2018-02-11 | Disposition: A | Discharge status: Home / Self Care | Payer: Medicare Other | Source: Home / Self Care | Attending: Family Medicine | Admitting: Family Medicine

## 2018-02-11 ENCOUNTER — Emergency Department (INDEPENDENT_AMBULATORY_CARE_PROVIDER_SITE_OTHER): Payer: Medicare Other

## 2018-02-11 ENCOUNTER — Encounter: Payer: Self-pay | Admitting: Emergency Medicine

## 2018-02-11 DIAGNOSIS — M25551 Pain in right hip: Secondary | ICD-10-CM

## 2018-02-11 DIAGNOSIS — R102 Pelvic and perineal pain: Secondary | ICD-10-CM | POA: Diagnosis not present

## 2018-02-11 DIAGNOSIS — W109XXA Fall (on) (from) unspecified stairs and steps, initial encounter: Secondary | ICD-10-CM

## 2018-02-11 NOTE — Discharge Instructions (Signed)
°  Please follow up with family medicine or Sports Medicine next week for further evaluation and treatment of your Right hip pain.

## 2018-02-11 NOTE — ED Provider Notes (Signed)
[facility]    CSN: [id number] Arrival date & time: 02/11/18  1621     History   Chief Complaint Chief Complaint  Patient presents with  . Hip Pain    HPI Gloria Mckay is a 77 y.o. female.   HPI  Gloria Mckay is a 77 y.o. female presenting to UC with c/o gradually worsening Right sided hip and buttock pain after fall on stairs about 3 weeks ago. Pt states she was about to fall down the stairs and decided to sit down but the step was lower than expected, she wound up landing hard on her buttocks.  She was seen by her PCP at the time and was concerned about her Left leg due to having a knee replacement. She had plain films at the time and was overall okay.  She is now concerned there may be a fracture in her Right hip due to the constant pain.  She does walk with a cane but used a cane prior to this incident.  Pt reports hx of chronic pain including chronic back pain. Denies numbness or weakness of her legs.    Past Medical History:  Diagnosis Date  . Arthritis   . Cancer ([place])   . Hypertension     Patient Active Problem List   Diagnosis Date Noted  . Pseudomonas infection 04/25/2017  . Long term (current) use of anticoagulants [Z79.01] 02/24/2017  . Primary hypercoagulable state ([place]) [D68.59] 02/24/2017  . Atrial fibrillation ([place]) [I48.91] 02/24/2017  . Acute venous embolism and thrombosis of deep vessels of proximal lower extremity ([place]) [I82.4Y9] 02/24/2017  . DDD (degenerative disc disease), cervical 02/11/2017  . Benign hypertension with CKD (chronic kidney disease) stage III ([place]) 10/28/2016  . History of arthroplasty of left knee 10/25/2016  . Plantar fasciitis, left 10/22/2016  . Fibroma of tongue 06/14/2016  . Postmenopausal osteoporosis 02/06/2016  . History of pulmonary embolism 01/30/2016  . CKD (chronic kidney disease) stage 3, GFR 30-59 ml/min ([facility]) 08/19/2015  . Dry eye syndrome of both lacrimal glands 08/19/2015  . S/P cataract extraction and  insertion of intraocular lens 08/19/2015  . Iron deficiency anemia due to chronic blood loss 02/21/2015  . Age-related macular degeneration 02/05/2015  . Congenital malformation of retina 02/05/2015  . Endothelial corneal dystrophy 02/05/2015  . Lumbar spinal stenosis 05/09/2014  . Chronic atrial fibrillation ([place]) 02/26/2014  . S/P colonoscopy 01/23/2014  . S/P endoscopy 01/23/2014  . Gastric bezoar 08/06/2013  . History of MRSA infection 12/30/2012  . Long term current use of anticoagulant therapy 08/23/2011  . Mild intermittent asthma without complication 56/38/7564  . Chest pain 01/15/2010  . Esophageal reflux 11/05/2009  . DDD (degenerative disc disease), lumbar 09/19/2009  . Infection and inflammatory reaction due to internal joint prosthesis ([place]) 08/08/2009  . Vitamin D deficiency 05/12/2009  . Chronic nausea 02/12/2009  . History of DVT of lower extremity 02/12/2009  . Recurrent major depressive disorder, in partial remission ([place]) 02/12/2009    Past Surgical History:  Procedure Laterality Date  . JOINT REPLACEMENT    . STOMACH SURGERY      OB History   None      Home Medications    Prior to Admission medications   Medication Sig Start Date End Date Taking? Authorizing Provider  AMBULATORY NON FORMULARY MEDICATION Home TENS unit, please use up to TID for 30 mins, this is medically necessary. 02/11/17   Silverio Decamp, MD  amLODipine (NORVASC) 10 MG tablet Take 1 tablet (10  mg total) by mouth daily. 05/06/17   Emeterio Reeve, DO  carvedilol (COREG) 25 MG tablet TAKE 1 TABLET TWICE DAILY WITH MEALS 11/24/17   Emeterio Reeve, DO  Cholecalciferol (VITAMIN D3) 50000 units CAPS Take 1 tablet by mouth daily. 10/18/17   [provider]  hydrALAZINE (APRESOLINE) 25 MG tablet Take 1 tablet (25 mg total) by mouth 2 (two) times daily. 05/06/17   Emeterio Reeve, DO  hydrochlorothiazide (HYDRODIURIL) 12.5 MG tablet Take 1 tablet (12.5 mg total) by mouth  daily. 06/29/17   Emeterio Reeve, DO  HYDROcodone-acetaminophen (NORCO) 7.5-325 MG tablet Take 1 tablet by mouth every 6 (six) hours as needed for moderate pain. Use sparingly. #60 for 30(thirty) days 02/07/18 03/09/18  Emeterio Reeve, DO  hydrOXYzine (ATARAX/VISTARIL) 10 MG tablet Take 10-20 mg by mouth 3 (three) times daily as needed for anxiety.    [provider]  losartan (COZAAR) 100 MG tablet Take 1 tablet (100 mg total) by mouth daily. 11/17/17   Emeterio Reeve, DO  mometasone (ELOCON) 0.1 % cream Apply 1 application topically daily.    [provider]  mupirocin ointment (BACTROBAN) 2 % Place 1 application into the nose 2 (two) times daily.    [provider]  Omega-3 Fatty Acids ([employer] FISH OIL PO) Take by mouth.    [provider]  topiramate (TOPAMAX) 100 MG tablet Take 100 mg by mouth 2 (two) times daily.    [provider]  warfarin (COUMADIN) 5 MG tablet TAKE 1 TABLET BY MOUTH EVERY DAY 12/26/17   Emeterio Reeve, DO  warfarin (COUMADIN) 7.5 MG tablet TAKE 1 AND 1/2 TABLETS DAILY EVERY SUNDAY, TUESDAY, THURSDAY THEN TAKE 1 TABLET EVERY MONDAY, [place], [place] AND SATURDAY 01/18/18   Emeterio Reeve, DO    Family History No family history on file.  Social History Social History   Tobacco Use  . Smoking status: Never Smoker  . Smokeless tobacco: Never Used  Substance Use Topics  . Alcohol use: No  . Drug use: No     Allergies   Cefuroxime; Diazepam; Latex; Other; Amlodipine; Diclofenac sodium; Diphenhydramine hcl; Methylpyrrolidone; Ondansetron; Oxycodone; Penicillins; Butorphanol tartrate; Ciprofloxacin; Lisinopril; Ramipril; Sulfa antibiotics; Ace inhibitors; Codeine; and Protonix  [pantoprazole sodium]   Review of Systems Review of Systems  Musculoskeletal: Positive for arthralgias, back pain and myalgias. Negative for joint swelling.  Skin: Negative for color change and wound.  Neurological: Negative for  weakness and numbness.     Physical Exam Triage Vital Signs ED Triage Vitals  Enc Vitals Group     BP 02/11/18 1701 (!) 160/80     Pulse Rate 02/11/18 1701 (!) 58     Resp --      Temp 02/11/18 1701 97.7 F (36.5 C)     Temp Source 02/11/18 1701 Oral     SpO2 02/11/18 1701 99 %     Weight 02/11/18 1702 110 lb 8 oz (50.1 kg)     Height 02/11/18 1702 5\' 3"  (1.6 m)     Head Circumference --      Peak Flow --      Pain Score 02/11/18 1702 6     Pain Loc --      Pain Edu? --      Excl. in [place]? --    No data found.  Updated Vital Signs BP (!) 160/80 (BP Location: Right Arm)   Pulse (!) 58   Temp 97.7 F (36.5 C) (Oral)   Ht 5\' 3"  (1.6 m)  Wt 110 lb 8 oz (50.1 kg)   SpO2 99%   BMI 19.57 kg/m   Visual Acuity Right Eye Distance:   Left Eye Distance:   Bilateral Distance:    Right Eye Near:   Left Eye Near:    Bilateral Near:     Physical Exam  Constitutional: She is oriented to person, place, and time. She appears well-developed and well-nourished. No distress.  HENT:  Head: Normocephalic and atraumatic.  Eyes: EOM are normal.  Neck: Normal range of motion.  Cardiovascular: Normal rate.  Pulmonary/Chest: Effort normal.  Musculoskeletal: Normal range of motion. She exhibits tenderness. She exhibits no edema.  Diffuse tenderness of back and Right hip.  No step-offs or deformity.  Antalgic gait, uses cane for assistance.    Neurological: She is alert and oriented to person, place, and time.  Skin: Skin is warm and dry. Capillary refill takes less than 2 seconds. She is not diaphoretic.  Psychiatric: She has a normal mood and affect. Her behavior is normal.  Nursing note and vitals reviewed.    UC Treatments / Results  Labs (all labs ordered are listed, but only abnormal results are displayed) Labs Reviewed - No data to display  EKG None  Radiology Dg Pelvis 1-2 Views  Result Date: 02/11/2018 CLINICAL DATA:  Initial encounter for Pt states she sat down  hard on a step 3 weeks ago and since then she has had right posterior buttock pain. Pt unable to lay flat but is able to stand. EXAM: PELVIS - 1-2 VIEW COMPARISON:  None. FINDINGS: Femoral heads are located. Mild osteopenia. Sacroiliac joints are symmetric. No acute fracture. IMPRESSION: No acute osseous abnormality. Electronically Signed   By: Abigail Miyamoto M.D.   On: 02/11/2018 17:42    Procedures Procedures (including critical care time)  Medications Ordered in UC Medications - No data to display  Initial Impression / Assessment and Plan / UC Course  I have reviewed the triage vital signs and the nursing notes.  Pertinent labs & imaging results that were available during my care of the patient were reviewed by me and considered in my medical decision making (see chart for details).     Discussed imaging with pt and her daughter. Encouraged f/u with Sports Medicine due to persistent pain but normal plain films. Pt and daughter verbalized understanding and agreement with tx plan.   Final Clinical Impressions(s) / UC Diagnoses   Final diagnoses:  Fall on stairs  Right hip pain     Discharge Instructions      Please follow up with family medicine or Sports Medicine next week for further evaluation and treatment of your Right hip pain.     ED Prescriptions    None     Controlled Substance Prescriptions [place] Controlled Substance Registry consulted? Not Applicable   Tyrell Antonio 02/12/18 1114

## 2018-02-11 NOTE — ED Triage Notes (Signed)
Patient sat down really hard coming down some steps about 3 weeks ago.  The left side of her bottom is bruised really badly, afraid there might be a fracture in her hip.

## 2018-02-23 ENCOUNTER — Encounter: Payer: Self-pay | Admitting: Osteopathic Medicine

## 2018-02-23 ENCOUNTER — Telehealth: Payer: Self-pay | Admitting: Osteopathic Medicine

## 2018-02-23 ENCOUNTER — Ambulatory Visit (INDEPENDENT_AMBULATORY_CARE_PROVIDER_SITE_OTHER): Payer: Medicare Other | Admitting: Osteopathic Medicine

## 2018-02-23 VITALS — BP 180/98 | HR 48 | Temp 97.5°F | Wt 110.7 lb

## 2018-02-23 DIAGNOSIS — G319 Degenerative disease of nervous system, unspecified: Secondary | ICD-10-CM | POA: Diagnosis not present

## 2018-02-23 DIAGNOSIS — Z86711 Personal history of pulmonary embolism: Secondary | ICD-10-CM | POA: Diagnosis not present

## 2018-02-23 DIAGNOSIS — M79661 Pain in right lower leg: Secondary | ICD-10-CM | POA: Diagnosis not present

## 2018-02-23 DIAGNOSIS — R9431 Abnormal electrocardiogram [ECG] [EKG]: Secondary | ICD-10-CM | POA: Diagnosis not present

## 2018-02-23 DIAGNOSIS — D689 Coagulation defect, unspecified: Secondary | ICD-10-CM | POA: Diagnosis not present

## 2018-02-23 DIAGNOSIS — G8928 Other chronic postprocedural pain: Secondary | ICD-10-CM | POA: Diagnosis not present

## 2018-02-23 DIAGNOSIS — Z7901 Long term (current) use of anticoagulants: Secondary | ICD-10-CM | POA: Diagnosis not present

## 2018-02-23 DIAGNOSIS — M79662 Pain in left lower leg: Secondary | ICD-10-CM | POA: Diagnosis not present

## 2018-02-23 DIAGNOSIS — R531 Weakness: Secondary | ICD-10-CM | POA: Diagnosis not present

## 2018-02-23 DIAGNOSIS — N189 Chronic kidney disease, unspecified: Secondary | ICD-10-CM | POA: Diagnosis not present

## 2018-02-23 DIAGNOSIS — R410 Disorientation, unspecified: Secondary | ICD-10-CM | POA: Diagnosis not present

## 2018-02-23 DIAGNOSIS — R42 Dizziness and giddiness: Secondary | ICD-10-CM | POA: Diagnosis not present

## 2018-02-23 DIAGNOSIS — I10 Essential (primary) hypertension: Secondary | ICD-10-CM

## 2018-02-23 DIAGNOSIS — R5383 Other fatigue: Secondary | ICD-10-CM | POA: Diagnosis not present

## 2018-02-23 DIAGNOSIS — M1991 Primary osteoarthritis, unspecified site: Secondary | ICD-10-CM | POA: Diagnosis not present

## 2018-02-23 DIAGNOSIS — R001 Bradycardia, unspecified: Secondary | ICD-10-CM | POA: Diagnosis not present

## 2018-02-23 DIAGNOSIS — I4510 Unspecified right bundle-branch block: Secondary | ICD-10-CM | POA: Diagnosis not present

## 2018-02-23 DIAGNOSIS — I509 Heart failure, unspecified: Secondary | ICD-10-CM | POA: Diagnosis not present

## 2018-02-23 DIAGNOSIS — I480 Paroxysmal atrial fibrillation: Secondary | ICD-10-CM

## 2018-02-23 DIAGNOSIS — K219 Gastro-esophageal reflux disease without esophagitis: Secondary | ICD-10-CM | POA: Diagnosis not present

## 2018-02-23 DIAGNOSIS — I130 Hypertensive heart and chronic kidney disease with heart failure and stage 1 through stage 4 chronic kidney disease, or unspecified chronic kidney disease: Secondary | ICD-10-CM | POA: Diagnosis not present

## 2018-02-23 LAB — POCT URINALYSIS DIPSTICK
BILIRUBIN UA: NEGATIVE
Glucose, UA: NEGATIVE
KETONES UA: NEGATIVE
Leukocytes, UA: NEGATIVE
Nitrite, UA: NEGATIVE
Protein, UA: NEGATIVE
Spec Grav, UA: 1.01 (ref 1.010–1.025)
UROBILINOGEN UA: 0.2 U/dL
pH, UA: 5.5 (ref 5.0–8.0)

## 2018-02-23 LAB — POCT INR: INR: 2.3 (ref 2.0–3.0)

## 2018-02-23 MED ORDER — WARFARIN SODIUM 5 MG PO TABS: 5.0000 mg | ORAL_TABLET | Freq: Every day | ORAL | 1 refills | Status: DC

## 2018-02-23 NOTE — Progress Notes (Signed)
HPI: Gloria Mckay is a 77 y.o. female who presents to [facility] today, 02/23/18,  for chief complaint of:  INR check Recheck chronic issues New problem: delirium/confusion   This AM going about usual activities but then couldn't remember what she was doing, confused/disoriented, couldn't remember how to make her coffee. 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 Felt like she was losing time.   Granddaughter accompanies her today, states she is not acting her normal self, though pt had the presence of mind to recall these events and to not drive herself to today's appointment.   No focal new weakness (chronic LLE weakness). No speech impairment.. No HA/SOB. No new pain.   She is fairly confident she has taken meds as usual in past 24 hours (no accidental overdose)       Past medical history, surgical history, and family history reviewed.  Current medication list and allergy/intolerance information reviewed.   (See remainder of HPI, ROS, Phys Exam below)  BP 174/89 on intake. Recheck BP (!) 180/98 (BP Location: Left Arm, Patient Position: Sitting, Cuff Size: Small)   Pulse (!) 48   Temp (!) 97.5 F (36.4 C) (Oral)   Wt 110 lb 11.2 oz (50.2 kg)   BMI 19.61 kg/m     Results for orders placed or performed in visit on 02/23/18 (from the past 72 hour(s))  POCT Urinalysis Dipstick     Status: None   Collection Time: 02/23/18  2:59 PM  Result Value Ref Range   Color, UA yellow    Clarity, UA clear    Glucose, UA Negative Negative   Bilirubin, UA negative    Ketones, UA negative    Spec Grav, UA 1.010 1.010 - 1.025   Blood, UA trace-intact    pH, UA 5.5 5.0 - 8.0   Protein, UA Negative Negative   Urobilinogen, UA 0.2 0.2 or 1.0 E.U./dL   Nitrite, UA negative    Leukocytes, UA Negative Negative   Appearance     Odor    POCT INR     Status: None   Collection Time: 02/23/18  3:00 PM  Result Value Ref  Range   INR 2.3 2.0 - 3.0      EKG interpretation: Rate: bradycardia Rhythm: sinus No ST/T changes concerning for acute ischemia/infarct  Previous EKG shows stable Q and inverted T changes      ASSESSMENT/PLAN: The primary encounter diagnosis was Delirium. Diagnoses of Disorientation, Long term (current) use of anticoagulants, Essential hypertension, Paroxysmal atrial fibrillation ([place]), Other chronic postprocedural pain, and Bradycardia were also pertinent to this visit.  POC UA and EKG are ok.  If obvious UTI or other exam findings, I'd be ok to treat and send home, but in absence of definite dx I think ER transfer for strong consideration of CT head +/- neuro eval, troponins, need to check renal fxn, Utox,  treat HTN urgency w/ possible cerebral end organ signs   Patient Instructions  Delirium is a change in mental status which can have many causes, including but not limited to: infection, heart attack, stroke/mini-stroke, metabolic problem like decline in kidney or liver function, thyroid abnormality, anemia, or medication toxicity. We can do a very basic evaluation here in the office, but typically an ER visit is warranted to expedite the workup and possibly catch a serious problem which would require hospital admission.    Follow-up plan: Return for recheck depending on ER  workup .     ############################################ ############################################ ############################################ ############################################  Patient Active Problem List   Diagnosis Date Noted  . Pseudomonas infection 04/25/2017  . Long term (current) use of anticoagulants [Z79.01] 02/24/2017  . Primary hypercoagulable state ([place]) [D68.59] 02/24/2017  . Atrial fibrillation ([place]) [I48.91] 02/24/2017  . Acute venous embolism and thrombosis of deep vessels of proximal lower extremity ([place]) [I82.4Y9] 02/24/2017  . DDD (degenerative disc disease),  cervical 02/11/2017  . Benign hypertension with CKD (chronic kidney disease) stage III ([place]) 10/28/2016  . History of arthroplasty of left knee 10/25/2016  . Plantar fasciitis, left 10/22/2016  . Fibroma of tongue 06/14/2016  . Postmenopausal osteoporosis 02/06/2016  . History of pulmonary embolism 01/30/2016  . CKD (chronic kidney disease) stage 3, GFR 30-59 ml/min (HCC) 08/19/2015  . Dry eye syndrome of both lacrimal glands 08/19/2015  . S/P cataract extraction and insertion of intraocular lens 08/19/2015  . Iron deficiency anemia due to chronic blood loss 02/21/2015  . Age-related macular degeneration 02/05/2015  . Congenital malformation of retina 02/05/2015  . Endothelial corneal dystrophy 02/05/2015  . Lumbar spinal stenosis 05/09/2014  . Chronic atrial fibrillation ([place]) 02/26/2014  . S/P colonoscopy 01/23/2014  . S/P endoscopy 01/23/2014  . Gastric bezoar 08/06/2013  . History of MRSA infection 12/30/2012  . Long term current use of anticoagulant therapy 08/23/2011  . Mild intermittent asthma without complication 63/14/9702  . Chest pain 01/15/2010  . Esophageal reflux 11/05/2009  . DDD (degenerative disc disease), lumbar 09/19/2009  . Infection and inflammatory reaction due to internal joint prosthesis ([place]) 08/08/2009  . Vitamin D deficiency 05/12/2009  . Chronic nausea 02/12/2009  . History of DVT of lower extremity 02/12/2009  . Recurrent major depressive disorder, in partial remission ([place]) 02/12/2009     Outpatient Encounter Medications as of 02/23/2018  Medication Sig Note  . AMBULATORY NON FORMULARY MEDICATION Home TENS unit, please use up to TID for 30 mins, this is medically necessary.   Marland Kitchen amLODipine (NORVASC) 10 MG tablet Take 1 tablet (10 mg total) by mouth daily.   . carvedilol (COREG) 25 MG tablet TAKE 1 TABLET TWICE DAILY WITH MEALS   . Cholecalciferol (VITAMIN D3) 50000 units CAPS Take 1 tablet by mouth daily.   . hydrALAZINE (APRESOLINE) 25 MG tablet  Take 1 tablet (25 mg total) by mouth 2 (two) times daily.   . hydrochlorothiazide (HYDRODIURIL) 12.5 MG tablet Take 1 tablet (12.5 mg total) by mouth daily.   Marland Kitchen HYDROcodone-acetaminophen (NORCO) 7.5-325 MG tablet Take 1 tablet by mouth every 6 (six) hours as needed for moderate pain. Use sparingly. #60 for 30(thirty) days   . hydrOXYzine (ATARAX/VISTARIL) 10 MG tablet Take 10-20 mg by mouth 3 (three) times daily as needed for anxiety.   Marland Kitchen losartan (COZAAR) 100 MG tablet Take 1 tablet (100 mg total) by mouth daily.   . mometasone (ELOCON) 0.1 % cream Apply 1 application topically daily.   . mupirocin ointment (BACTROBAN) 2 % Place 1 application into the nose 2 (two) times daily.   . Omega-3 Fatty Acids ([employer] FISH OIL PO) Take by mouth.   . topiramate (TOPAMAX) 100 MG tablet Take 100 mg by mouth 2 (two) times daily. 04/21/2017: Taking 1 and 1/2 tablets AM, 2 tablets PM  . warfarin (COUMADIN) 5 MG tablet TAKE 1 TABLET BY MOUTH EVERY DAY   . warfarin (COUMADIN) 7.5 MG tablet TAKE 1 AND 1/2 TABLETS DAILY EVERY SUNDAY, TUESDAY, THURSDAY THEN TAKE 1 TABLET EVERY MONDAY, WEDNESDAY, [place]  No facility-administered encounter medications on file as of 02/23/2018.    Allergies  Allergen Reactions  . Cefuroxime Anaphylaxis    Difficulty swallowing pills because they were too dry.  Caused tablet dysphagia.  . Diazepam Shortness Of Breath    HYPERVENTILATION  HYPERVENTILATION   . Latex Rash and Shortness Of Breath    Airway swelling  . Other Palpitations    Affected heart rate/er told her not to take again Affected heart rate/er told her not to take again  . Amlodipine Cough    Not sure  . Diclofenac Sodium Hives  . Diphenhydramine Hcl Palpitations    Affected heart rate/er told her not to take again  . Methylpyrrolidone Hives  . Ondansetron Hives and Other (See Comments)    Sedates/knocks her out Sedates/knocks her out  . Oxycodone Hives  . Penicillins Hives and Swelling    Most  mycin drugs  . Butorphanol Tartrate Hives  . Ciprofloxacin     Patient prefers not to take Fluoroquinolones  . Lisinopril Cough    Caused pt to cough  . Ramipril Cough    Pt c/o cough  . Sulfa Antibiotics   . Ace Inhibitors Other (See Comments)    Lisinopril and ramipril  . Codeine Nausea Only  . Protonix  [Pantoprazole Sodium] Diarrhea      Review of Systems:  Constitutional: +recent illness, worsening generalized weakness as per HPI. No fever/chills, no night sweats.   HEENT: No  headache, no vision blacking out or vision loss.   Cardiac: No  chest pain, No  pressure, No palpitations  Respiratory:  No  shortness of breath. No  Cough  Gastrointestinal: No  abdominal pain, no change on bowel habits  Musculoskeletal: No new myalgia/arthralgia  Skin: No  Rash  Hem/Onc: No  easy bruising/bleeding, No  abnormal lumps/bumps  Neurologic: No  weakness, No  Dizziness  Psychiatric: No  concerns with depression, No  concerns with anxiety  Exam:  BP (!) 174/89 (BP Location: Left Arm, Patient Position: Sitting, Cuff Size: Small)   Pulse (!) 52   Temp (!) 97.5 F (36.4 C) (Oral)   Wt 110 lb 11.2 oz (50.2 kg)   BMI 19.61 kg/m   Constitutional: VS see above. General Appearance: alert, well-developed, well-nourished, NAD  Eyes: Normal lids and conjunctive, non-icteric sclera  Ears, Nose, Mouth, Throat: MMM, Normal external inspection ears/nares/mouth/lips/gums.  Neck: No masses, trachea midline.   Respiratory: Normal respiratory effort. no wheeze, no rhonchi, no rales  Cardiovascular: S1/S2 normal, no murmur, no rub/gallop auscultated. RRR.   Musculoskeletal: Gait normal. Symmetric and independent movement of all extremities  Neurological: Normal balance/coordination. No tremor.  Skin: warm, dry, intact.   Psychiatric: Normal judgment/insight. Normal mood and affect. Oriented x3.   Visit summary with medication list and pertinent instructions was printed for patient  to review, advised to alert [place] if any changes needed. All questions at time of visit were answered - patient instructed to contact office with any additional concerns. ER/RTC precautions were reviewed with the patient and understanding verbalized.   Follow-up plan: Return for recheck depending on ER workup .  Note: Total time spent 40 minutes, greater than 50% of the visit was spent face-to-face counseling and coordinating care for the following: The primary encounter diagnosis was Delirium. Diagnoses of Disorientation, Long term (current) use of anticoagulants, Essential hypertension, Paroxysmal atrial fibrillation ([place]), and Other chronic postprocedural pain were also pertinent to this visit.Marland Kitchen  Please note: voice recognition software was used to produce  this document, and typos may escape review. Please contact Dr. Sheppard Coil for any needed clarifications.

## 2018-02-23 NOTE — Patient Instructions (Addendum)
Delirium is a change in mental status which can have many causes, including but not limited to: infection, heart attack, stroke/mini-stroke, metabolic problem like decline in kidney or liver function, thyroid abnormality, anemia, or medication toxicity. We can do a very basic evaluation here in the office, but typically an ER visit is warranted to expedite the workup and possibly catch a serious problem which would require hospital admission.

## 2018-02-23 NOTE — Telephone Encounter (Signed)
Attempted to call ER triage at [facility] twice and got no connection. They'll get there by the time I actually get a hold of someone on the phone but let's at least try one more time [phone] (or a better number if you know it) and ask to be connected to ER triage - can let them know patient is coming by private vehicle for acute delirium evaluation, my office note is done in the chart and should be visible in Care Everywhere

## 2018-02-23 NOTE — Telephone Encounter (Signed)
Attempted to contact [place] ED, was on hold for 6 min. No answer.

## 2018-02-24 LAB — URINE CULTURE
MICRO NUMBER: 90769948
Result:: NO GROWTH
SPECIMEN QUALITY: ADEQUATE

## 2018-02-24 LAB — URINALYSIS, MICROSCOPIC ONLY
Bacteria, UA: NONE SEEN /HPF
Hyaline Cast: NONE SEEN /LPF
RBC / HPF: NONE SEEN /HPF (ref 0–2)
SQUAMOUS EPITHELIAL / LPF: NONE SEEN /HPF (ref <=5)

## 2018-02-27 DIAGNOSIS — D472 Monoclonal gammopathy: Secondary | ICD-10-CM | POA: Diagnosis not present

## 2018-02-28 ENCOUNTER — Encounter: Payer: Self-pay | Admitting: Osteopathic Medicine

## 2018-02-28 ENCOUNTER — Ambulatory Visit (INDEPENDENT_AMBULATORY_CARE_PROVIDER_SITE_OTHER): Payer: Medicare Other | Admitting: Osteopathic Medicine

## 2018-02-28 VITALS — BP 145/71 | HR 53 | Temp 97.8°F | Wt 112.3 lb

## 2018-02-28 DIAGNOSIS — R000 Tachycardia, unspecified: Secondary | ICD-10-CM | POA: Diagnosis not present

## 2018-02-28 DIAGNOSIS — R002 Palpitations: Secondary | ICD-10-CM

## 2018-02-28 DIAGNOSIS — I10 Essential (primary) hypertension: Secondary | ICD-10-CM

## 2018-02-28 DIAGNOSIS — E870 Hyperosmolality and hypernatremia: Secondary | ICD-10-CM

## 2018-02-28 DIAGNOSIS — I480 Paroxysmal atrial fibrillation: Secondary | ICD-10-CM

## 2018-02-28 DIAGNOSIS — Z86718 Personal history of other venous thrombosis and embolism: Secondary | ICD-10-CM | POA: Diagnosis not present

## 2018-02-28 MED ORDER — HYDRALAZINE HCL 25 MG PO TABS: 25.0000 mg | ORAL_TABLET | Freq: Two times a day (BID) | ORAL | 3 refills | Status: DC

## 2018-02-28 NOTE — Progress Notes (Signed)
HPI: Gloria Mckay is a 77 y.o. female who presents to [facility] today, 02/28/18,  for chief complaint of:  ER follow-up   She is feeling improved today, no concerns.  Seen last week for delirium: that AM she was going about usual activities but then couldn't remember what she was doing, confused/disoriented, couldn't remember how to make her coffee. 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 No focal new weakness (chronic LLE weakness). No speech impairment.. No HA/SOB. No new pain. No UTI in office UA, no EKG concerns but given acute mental status change we felt it was best for urgent ER evaluation - workup negative, labs in ER Hypernatremia.    Sees Dr Tressie Stalker next week - labs yesterday  Hypernatremia to 148 Hyperchloremia to 118 Hypoglycemia to 58  She has some concerns about intermittent fast heart rate on her home monitor, OK in office today. No chest pain. Sounds like sinus rhythm here but hx Afib. Has been quite some time since seen by cardiologsit.         Past medical history, surgical history, and family history reviewed.  Current medication list and allergy/intolerance information reviewed.   (See remainder of HPI, ROS, Phys Exam below)  BP (!) 145/71 (BP Location: Left Arm, Patient Position: Sitting, Cuff Size: Small)   Pulse (!) 53   Temp 97.8 F (36.6 C) (Oral)   Wt 112 lb 4.8 oz (50.9 kg)   BMI 19.89 kg/m     Results for orders placed or performed in visit on 02/28/18 (from the past 72 hour(s))  COMPLETE METABOLIC PANEL WITH GFR     Status: Abnormal   Collection Time: 02/28/18 12:07 PM  Result Value Ref Range   Glucose, Bld 87 65 - 99 mg/dL    Comment: .            Fasting reference interval .    BUN 29 (H) 7 - 25 mg/dL   Creat 1.62 (H) 0.60 - 0.93 mg/dL     Comment: For patients >15 years of age, the reference limit for Creatinine is approximately 13% higher for people identified as African-American. .    GFR, Est Non African American 31 (L) > OR = 60 mL/min/1.27m2   GFR, Est African American 36 (L) > OR = 60 mL/min/1.64m2   BUN/Creatinine Ratio 18 6 - 22 (calc)   Sodium 143 135 - 146 mmol/L   Potassium 4.4 3.5 - 5.3 mmol/L   Chloride 115 (H) 98 - 110 mmol/L   CO2 19 (L) 20 - 32 mmol/L   Calcium 8.7 8.6 - 10.4 mg/dL   Total Protein 6.1 6.1 - 8.1 g/dL   Albumin 4.2 3.6 - 5.1 g/dL   Globulin 1.9 1.9 - 3.7 g/dL (calc)   AG Ratio 2.2 1.0 - 2.5 (calc)   Total Bilirubin 0.3 0.2 - 1.2 mg/dL   Alkaline phosphatase (APISO) 92 33 - 130 U/L   AST 20 10 - 35 U/L   ALT 17 6 - 29 U/L  Osmolality     Status: Abnormal   Collection Time: 02/28/18 12:07 PM  Result Value Ref Range   Osmolality 306 (H) 278 - 305 mOsm/kg  TSH     Status: None   Collection Time: 02/28/18 12:07 PM  Result Value Ref Range  TSH 1.91 0.40 - 4.50 mIU/L  Osmolality, urine     Status: None   Collection Time: 02/28/18 12:07 PM  Result Value Ref Range   Osmolality, Ur 490 50 - 1,200 mOsm/kg  Sodium, urine, random     Status: None   Collection Time: 02/28/18 12:07 PM  Result Value Ref Range   Sodium, Ur 87 28 - 272 mmol/L  Potassium, urine, random     Status: None   Collection Time: 02/28/18 12:07 PM  Result Value Ref Range   Potassium Urine 23 12 - 129 mmol/L      EKG interpretation: Rate: bradycardia Rhythm: sinus No ST/T changes concerning for acute ischemia/infarct  Previous EKG shows stable Q and inverted T changes      ASSESSMENT/PLAN: The primary encounter diagnosis was Hypernatremia. Diagnoses of Essential hypertension, Paroxysmal atrial fibrillation ([place]), and Tachycardia were also pertinent to this visit.  recheck labs ok - will monitor - may get nephro involved if persistent or worsneing electrolyte derangements.   Orders Placed This Encounter   Procedures  . COMPLETE METABOLIC PANEL WITH GFR  . Osmolality  . TSH  . Osmolality, urine  . Sodium, urine, random  . Potassium, urine, random  . Osmolality, urine  . Sodium, urine, random  . Potassium, urine, random  . Ambulatory referral to Cardiology    Referral Priority:   Routine    Referral Type:   Consultation    Referral Reason:   Specialty Services Required    Requested Specialty:   Cardiology    Number of Visits Requested:   1      Patient Instructions  Plan:  Will send to cardiology for help with the heart (blood pressure, heart rate, Afib)   Will get labs today to evaluate for reasons sodium and chloride might be abnormal - may end up needing to see your kidney doctor in the next few weeks    Follow-up plan: Return in about 1 week (around 03/07/2018) for INR check (nurse visit ok) .     ############################################ ############################################ ############################################ ############################################  Patient Active Problem List   Diagnosis Date Noted  . Pseudomonas infection 04/25/2017  . Long term (current) use of anticoagulants [Z79.01] 02/24/2017  . Primary hypercoagulable state ([place]) [D68.59] 02/24/2017  . Atrial fibrillation ([place]) [I48.91] 02/24/2017  . Acute venous embolism and thrombosis of deep vessels of proximal lower extremity ([place]) [I82.4Y9] 02/24/2017  . DDD (degenerative disc disease), cervical 02/11/2017  . Benign hypertension with CKD (chronic kidney disease) stage III ([place]) 10/28/2016  . History of arthroplasty of left knee 10/25/2016  . Plantar fasciitis, left 10/22/2016  . Fibroma of tongue 06/14/2016  . Postmenopausal osteoporosis 02/06/2016  . History of pulmonary embolism 01/30/2016  . CKD (chronic kidney disease) stage 3, GFR 30-59 ml/min (HCC) 08/19/2015  . Dry eye syndrome of both lacrimal glands 08/19/2015  . S/P cataract extraction and insertion of intraocular  lens 08/19/2015  . Iron deficiency anemia due to chronic blood loss 02/21/2015  . Age-related macular degeneration 02/05/2015  . Congenital malformation of retina 02/05/2015  . Endothelial corneal dystrophy 02/05/2015  . Lumbar spinal stenosis 05/09/2014  . Chronic atrial fibrillation ([place]) 02/26/2014  . S/P colonoscopy 01/23/2014  . S/P endoscopy 01/23/2014  . Gastric bezoar 08/06/2013  . History of MRSA infection 12/30/2012  . Long term current use of anticoagulant therapy 08/23/2011  . Mild intermittent asthma without complication 07/37/1062  . Chest pain 01/15/2010  . Esophageal reflux 11/05/2009  . DDD (degenerative disc disease), lumbar  09/19/2009  . Infection and inflammatory reaction due to internal joint prosthesis ([place]) 08/08/2009  . Vitamin D deficiency 05/12/2009  . Chronic nausea 02/12/2009  . History of DVT of lower extremity 02/12/2009  . Recurrent major depressive disorder, in partial remission ([place]) 02/12/2009     Outpatient Encounter Medications as of 02/28/2018  Medication Sig Note  . AMBULATORY NON FORMULARY MEDICATION Home TENS unit, please use up to TID for 30 mins, this is medically necessary.   Marland Kitchen amLODipine (NORVASC) 10 MG tablet Take 1 tablet (10 mg total) by mouth daily.   . carvedilol (COREG) 25 MG tablet TAKE 1 TABLET TWICE DAILY WITH MEALS   . Cholecalciferol (VITAMIN D3) 50000 units CAPS Take 1 tablet by mouth daily.   . hydrALAZINE (APRESOLINE) 25 MG tablet Take 1 tablet (25 mg total) by mouth 2 (two) times daily.   . hydrochlorothiazide (HYDRODIURIL) 12.5 MG tablet Take 1 tablet (12.5 mg total) by mouth daily.   Marland Kitchen HYDROcodone-acetaminophen (NORCO) 7.5-325 MG tablet Take 1 tablet by mouth every 6 (six) hours as needed for moderate pain. Use sparingly. #60 for 30(thirty) days   . hydrOXYzine (ATARAX/VISTARIL) 10 MG tablet Take 10-20 mg by mouth 3 (three) times daily as needed for anxiety.   Marland Kitchen losartan (COZAAR) 100 MG tablet Take 1 tablet (100 mg total) by  mouth daily.   . mometasone (ELOCON) 0.1 % cream Apply 1 application topically daily.   . mupirocin ointment (BACTROBAN) 2 % Place 1 application into the nose 2 (two) times daily.   . Omega-3 Fatty Acids ([employer] FISH OIL PO) Take by mouth.   . topiramate (TOPAMAX) 100 MG tablet Take 100 mg by mouth 2 (two) times daily. 04/21/2017: Taking 1 and 1/2 tablets AM, 2 tablets PM  . warfarin (COUMADIN) 5 MG tablet Take 1 tablet (5 mg total) by mouth daily.   Marland Kitchen warfarin (COUMADIN) 7.5 MG tablet TAKE 1 AND 1/2 TABLETS DAILY EVERY SUNDAY, TUESDAY, THURSDAY THEN TAKE 1 TABLET EVERY MONDAY, WEDNESDAY, FRIDAY AND SATURDAY    No facility-administered encounter medications on file as of 02/28/2018.    Allergies  Allergen Reactions  . Cefuroxime Anaphylaxis    Difficulty swallowing pills because they were too dry.  Caused tablet dysphagia.  . Diazepam Shortness Of Breath    HYPERVENTILATION     . Diphenhydramine Palpitations    Affected heart rate/er told her not to take again  . Latex Rash and Shortness Of Breath    Airway swelling  . Other Palpitations    Affected heart rate/er told her not to take again  . Amlodipine Cough and Other (See Comments)    Not sure  . Butorphanol Hives  . Diclofenac Hives  . Diclofenac Sodium Hives  . Diphenhydramine Hcl Palpitations    Affected heart rate/er told her not to take again  . Methylpyrrolidone Hives  . Ondansetron Hives and Other (See Comments)    Sedates/knocks her out   . Oxycodone Hives  . Penicillins Hives and Swelling    Most mycin drugs  . Quinolones Other (See Comments)    Joint pain  . Sulfa Antibiotics Hives and Swelling  . Butorphanol Tartrate Hives  . Ciprofloxacin     Patient prefers not to take Fluoroquinolones  . Lisinopril Cough    Caused pt to cough  . Ramipril Cough    Pt c/o cough  . Ace Inhibitors Other (See Comments) and Cough    Lisinopril and ramipril   . Codeine Nausea Only  .  Pantoprazole Sodium Diarrhea      Review  of Systems:  Constitutional: +recent illness, worsening generalized weakness as per HPI. No fever/chills, no night sweats.   HEENT: No  headache, no vision blacking out or vision loss.   Cardiac: No  chest pain, No  pressure, No palpitations  Respiratory:  No  shortness of breath. No  Cough  Gastrointestinal: No  abdominal pain, no change on bowel habits  Musculoskeletal: No new myalgia/arthralgia  Skin: No  Rash  Hem/Onc: No  easy bruising/bleeding, No  abnormal lumps/bumps  Neurologic: No  weakness, No  Dizziness  Psychiatric: No  concerns with depression, No  concerns with anxiety  Exam:  BP (!) 145/71 (BP Location: Left Arm, Patient Position: Sitting, Cuff Size: Small)   Pulse (!) 53   Temp 97.8 F (36.6 C) (Oral)   Wt 112 lb 4.8 oz (50.9 kg)   BMI 19.89 kg/m   Constitutional: VS see above. General Appearance: alert, well-developed, well-nourished, NAD  Eyes: Normal lids and conjunctive, non-icteric sclera  Ears, Nose, Mouth, Throat: MMM, Normal external inspection ears/nares/mouth/lips/gums.  Neck: No masses, trachea midline.   Respiratory: Normal respiratory effort. no wheeze, no rhonchi, no rales  Cardiovascular: S1/S2 normal, no murmur, no rub/gallop auscultated. RRR.   Musculoskeletal: Gait normal. Symmetric and independent movement of all extremities  Neurological: Normal balance/coordination. No tremor.  Skin: warm, dry, intact.   Psychiatric: Normal judgment/insight. Normal mood and affect. Oriented x3.   Visit summary with medication list and pertinent instructions was printed for patient to review, advised to alert [place] if any changes needed. All questions at time of visit were answered - patient instructed to contact office with any additional concerns. ER/RTC precautions were reviewed with the patient and understanding verbalized.   Follow-up plan: No follow-ups on file.  Note: Total time spent 40 minutes, greater than 50% of the visit was spent  face-to-face counseling and coordinating care for the following: There were no encounter diagnoses..  Please note: voice recognition software was used to produce this document, and typos may escape review. Please contact Dr. Sheppard Coil for any needed clarifications.

## 2018-02-28 NOTE — Patient Instructions (Signed)
Plan:  Will send to cardiology for help with the heart (blood pressure, heart rate, Afib)   Will get labs today to evaluate for reasons sodium and chloride might be abnormal - may end up needing to see your kidney doctor in the next few weeks

## 2018-03-02 ENCOUNTER — Encounter: Payer: Self-pay | Admitting: Osteopathic Medicine

## 2018-03-02 LAB — OSMOLALITY: OSMOLALITY: 306 mosm/kg — AB (ref 278–305)

## 2018-03-02 LAB — COMPLETE METABOLIC PANEL WITH GFR
AG Ratio: 2.2 (calc) (ref 1.0–2.5)
ALKALINE PHOSPHATASE (APISO): 92 U/L (ref 33–130)
ALT: 17 U/L (ref 6–29)
AST: 20 U/L (ref 10–35)
Albumin: 4.2 g/dL (ref 3.6–5.1)
BUN/Creatinine Ratio: 18 (calc) (ref 6–22)
BUN: 29 mg/dL — ABNORMAL HIGH (ref 7–25)
CALCIUM: 8.7 mg/dL (ref 8.6–10.4)
CO2: 19 mmol/L — AB (ref 20–32)
CREATININE: 1.62 mg/dL — AB (ref 0.60–0.93)
Chloride: 115 mmol/L — ABNORMAL HIGH (ref 98–110)
GFR, EST NON AFRICAN AMERICAN: 31 mL/min/{1.73_m2} — AB (ref >=60)
GFR, Est African American: 36 mL/min/{1.73_m2} — ABNORMAL LOW (ref >=60)
GLUCOSE: 87 mg/dL (ref 65–99)
Globulin: 1.9 g/dL (calc) (ref 1.9–3.7)
POTASSIUM: 4.4 mmol/L (ref 3.5–5.3)
SODIUM: 143 mmol/L (ref 135–146)
Total Bilirubin: 0.3 mg/dL (ref 0.2–1.2)
Total Protein: 6.1 g/dL (ref 6.1–8.1)

## 2018-03-02 LAB — TSH: TSH: 1.91 m[IU]/L (ref 0.40–4.50)

## 2018-03-02 LAB — SODIUM, URINE, RANDOM: SODIUM UR: 87 mmol/L (ref 28–272)

## 2018-03-02 LAB — POTASSIUM, URINE, RANDOM: POTASSIUM UR: 23 mmol/L (ref 12–129)

## 2018-03-02 LAB — OSMOLALITY, URINE: OSMOLALITY UR: 490 mosm/kg (ref 50–1200)

## 2018-03-03 ENCOUNTER — Other Ambulatory Visit: Payer: Self-pay

## 2018-03-03 MED ORDER — CARVEDILOL 25 MG PO TABS: 25.0000 mg | ORAL_TABLET | Freq: Two times a day (BID) | ORAL | 0 refills | Status: DC

## 2018-03-03 MED ORDER — LOSARTAN POTASSIUM 100 MG PO TABS: 100.0000 mg | ORAL_TABLET | Freq: Every day | ORAL | 0 refills | Status: DC

## 2018-03-06 DIAGNOSIS — D472 Monoclonal gammopathy: Secondary | ICD-10-CM | POA: Diagnosis not present

## 2018-03-07 ENCOUNTER — Ambulatory Visit (INDEPENDENT_AMBULATORY_CARE_PROVIDER_SITE_OTHER): Payer: Medicare Other | Admitting: Physician Assistant

## 2018-03-07 DIAGNOSIS — I482 Chronic atrial fibrillation: Secondary | ICD-10-CM

## 2018-03-07 DIAGNOSIS — D6859 Other primary thrombophilia: Secondary | ICD-10-CM | POA: Diagnosis not present

## 2018-03-07 DIAGNOSIS — I824Y9 Acute embolism and thrombosis of unspecified deep veins of unspecified proximal lower extremity: Secondary | ICD-10-CM | POA: Diagnosis not present

## 2018-03-07 DIAGNOSIS — I4821 Permanent atrial fibrillation: Secondary | ICD-10-CM

## 2018-03-07 DIAGNOSIS — Z7901 Long term (current) use of anticoagulants: Secondary | ICD-10-CM | POA: Diagnosis not present

## 2018-03-07 LAB — POCT INR: INR: 2.1 (ref 2.0–3.0)

## 2018-03-08 DIAGNOSIS — R1310 Dysphagia, unspecified: Secondary | ICD-10-CM | POA: Diagnosis not present

## 2018-03-08 DIAGNOSIS — K911 Postgastric surgery syndromes: Secondary | ICD-10-CM | POA: Diagnosis not present

## 2018-03-08 DIAGNOSIS — R197 Diarrhea, unspecified: Secondary | ICD-10-CM | POA: Diagnosis not present

## 2018-03-13 ENCOUNTER — Telehealth: Payer: Self-pay

## 2018-03-13 DIAGNOSIS — G8928 Other chronic postprocedural pain: Secondary | ICD-10-CM

## 2018-03-13 MED ORDER — HYDROCODONE-ACETAMINOPHEN 7.5-325 MG PO TABS: 1.0000 | ORAL_TABLET | Freq: Four times a day (QID) | ORAL | 0 refills | Status: DC | PRN

## 2018-03-13 NOTE — Telephone Encounter (Signed)
Gloria Mckay has to have a procedure and will stop the coumadin 5 days prior to the procedure. She will restart the coumadin on Wednesday and that is the day she takes 7.5 mg. The schedule is 7.5 mg on Tuesday, Wednesday, Friday and Sunday - 5 mg on Monday, Thursday and Saturday. She wanted to know if she should resume her current schedule.   Also she needs a refill on Hydrocodone.

## 2018-03-13 NOTE — Telephone Encounter (Signed)
Patient advised of recommendations.  

## 2018-03-13 NOTE — Telephone Encounter (Signed)
Resuming normal schedule is fine Will send refill meds  Check INR one week after restart

## 2018-03-14 ENCOUNTER — Ambulatory Visit: Payer: Medicare Other | Admitting: Sports Medicine

## 2018-03-15 DIAGNOSIS — R197 Diarrhea, unspecified: Secondary | ICD-10-CM | POA: Diagnosis not present

## 2018-03-15 DIAGNOSIS — K5730 Diverticulosis of large intestine without perforation or abscess without bleeding: Secondary | ICD-10-CM | POA: Diagnosis not present

## 2018-03-15 DIAGNOSIS — R1310 Dysphagia, unspecified: Secondary | ICD-10-CM | POA: Diagnosis not present

## 2018-03-15 DIAGNOSIS — K449 Diaphragmatic hernia without obstruction or gangrene: Secondary | ICD-10-CM | POA: Diagnosis not present

## 2018-03-15 DIAGNOSIS — Z980 Intestinal bypass and anastomosis status: Secondary | ICD-10-CM | POA: Diagnosis not present

## 2018-03-15 DIAGNOSIS — K222 Esophageal obstruction: Secondary | ICD-10-CM | POA: Diagnosis not present

## 2018-03-15 LAB — HM COLONOSCOPY

## 2018-03-17 ENCOUNTER — Ambulatory Visit: Payer: Medicare Other | Admitting: Cardiology

## 2018-03-17 ENCOUNTER — Ambulatory Visit (INDEPENDENT_AMBULATORY_CARE_PROVIDER_SITE_OTHER): Payer: Medicare Other | Admitting: Cardiology

## 2018-03-17 ENCOUNTER — Encounter: Payer: Self-pay | Admitting: Cardiology

## 2018-03-17 VITALS — BP 100/60 | HR 73 | Ht 63.0 in | Wt 113.8 lb

## 2018-03-17 DIAGNOSIS — I129 Hypertensive chronic kidney disease with stage 1 through stage 4 chronic kidney disease, or unspecified chronic kidney disease: Secondary | ICD-10-CM | POA: Diagnosis not present

## 2018-03-17 DIAGNOSIS — N183 Chronic kidney disease, stage 3 unspecified: Secondary | ICD-10-CM

## 2018-03-17 DIAGNOSIS — Z7901 Long term (current) use of anticoagulants: Secondary | ICD-10-CM | POA: Diagnosis not present

## 2018-03-17 DIAGNOSIS — Z86718 Personal history of other venous thrombosis and embolism: Secondary | ICD-10-CM | POA: Diagnosis not present

## 2018-03-17 DIAGNOSIS — I480 Paroxysmal atrial fibrillation: Secondary | ICD-10-CM

## 2018-03-17 NOTE — Patient Instructions (Signed)
Medication Instructions:  Your physician recommends that you continue on your current medications as directed. Please refer to the Current Medication list given to you today.   Labwork: None  Testing/Procedures: You had an EKG today.   Your physician has requested that you have an echocardiogram. Echocardiography is a painless test that uses sound waves to create images of your heart. It provides your doctor with information about the size and shape of your heart and how well your heart's chambers and valves are working. This procedure takes approximately one hour. There are no restrictions for this procedure.  Your physician has recommended that you wear a holter monitor. Holter monitors are medical devices that record the heart's electrical activity. Doctors most often use these monitors to diagnose arrhythmias. Arrhythmias are problems with the speed or rhythm of the heartbeat. The monitor is a small, portable device. You can wear one while you do your normal daily activities. This is usually used to diagnose what is causing palpitations/syncope (passing out). Wear for 48 hours.   Follow-Up: Your physician recommends that you schedule a follow-up appointment in: 1 month.   If you need a refill on your cardiac medications before your next appointment, please call your pharmacy.   Thank you for choosing [facility]! Robyne Peers, RN [phone]     Echocardiogram An echocardiogram, or echocardiography, uses sound waves (ultrasound) to produce an image of your heart. The echocardiogram is simple, painless, obtained within a short period of time, and offers valuable information to your health care provider. The images from an echocardiogram can provide information such as:  Evidence of coronary artery disease (CAD).  Heart size.  Heart muscle function.  Heart valve function.  Aneurysm detection.  Evidence of a past heart attack.  Fluid buildup around the  heart.  Heart muscle thickening.  Assess heart valve function.  Tell a health care provider about:  Any allergies you have.  All medicines you are taking, including vitamins, herbs, eye drops, creams, and over-the-counter medicines.  Any problems you or family members have had with anesthetic medicines.  Any blood disorders you have.  Any surgeries you have had.  Any medical conditions you have.  Whether you are pregnant or may be pregnant. What happens before the procedure? No special preparation is needed. Eat and drink normally. What happens during the procedure?  In order to produce an image of your heart, gel will be applied to your chest and a wand-like tool (transducer) will be moved over your chest. The gel will help transmit the sound waves from the transducer. The sound waves will harmlessly bounce off your heart to allow the heart images to be captured in real-time motion. These images will then be recorded.  You may need an IV to receive a medicine that improves the quality of the pictures. What happens after the procedure? You may return to your normal schedule including diet, activities, and medicines, unless your health care provider tells you otherwise. This information is not intended to replace advice given to you by your health care provider. Make sure you discuss any questions you have with your health care provider. Document Released: 08/13/2000 Document Revised: 04/03/2016 Document Reviewed: 04/23/2013 Elsevier Interactive Patient Education  2017 [place].    Holter Monitoring A Holter monitor is a small device that is used to detect abnormal heart rhythms. It clips to your clothing and is connected by wires to flat, sticky disks (electrodes) that attach to your chest. It is worn continuously for  24-48 hours. Follow these instructions at home:  Wear your Holter monitor at all times, even while exercising and sleeping, for as long as directed by your  health care provider.  Make sure that the Holter monitor is safely clipped to your clothing or close to your body as recommended by your health care provider.  Do not get the monitor or wires wet.  Do not put body lotion or moisturizer on your chest.  Keep your skin clean.  Keep a diary of your daily activities, such as walking and doing chores. If you feel that your heartbeat is abnormal or that your heart is fluttering or skipping a beat: ? Record what you are doing when it happens. ? Record what time of day the symptoms occur.  Return your Holter monitor as directed by your health care provider.  Keep all follow-up visits as directed by your health care provider. This is important. Get help right away if:  You feel lightheaded or you faint.  You have trouble breathing.  You feel pain in your chest, upper arm, or jaw.  You feel sick to your stomach and your skin is pale, cool, or damp.  You heartbeat feels unusual or abnormal. This information is not intended to replace advice given to you by your health care provider. Make sure you discuss any questions you have with your health care provider. Document Released: 05/14/2004 Document Revised: 01/22/2016 Document Reviewed: 03/25/2014 Elsevier Interactive Patient Education  [employer].

## 2018-03-17 NOTE — Progress Notes (Signed)
Cardiology Consultation:    Date:  03/17/2018   ID:  Gloria Mckay, DOB 10-Nov-1940, MRN [id number]  PCP:  Emeterio Reeve, DO  Cardiologist:  Jenne Campus, MD   Referring MD: Emeterio Reeve, DO   Chief Complaint  Patient presents with  . Atrial Fibrillation  I have atrial fibrillation  History of Present Illness:    Gloria Mckay is a 77 y.o. female who is being seen today for the evaluation of atrial fibrillation at the request of Emeterio Reeve, DO.  She is a sweet lady with multiple medical problems.  Apparently years ago she got DVT ended up having pulmonary emboli for more than the stent she did have that around surgery for her left knee at that time he was put on anticoagulation and also [place] filter has been implanted.  She also suffered from paroxysmal atrial fibrillation she says she feels palpitations almost every single day however I did not see any documented episode of atrial fibrillation in the chart.  She is being anticoagulated with Coumadin for many years.  Has some difficulty keeping her INR within therapeutic range.  Recently she suffered some falls she said her leg gave up and she ended up falling down she ended up going to the emergency room because of this.  She also visited the emergency room a few weeks ago because of periods of confusion.  She denies having any chest pain tightness squeezing pressure burning chest she does have some palpitations she lives alone her family calling her quite often and helps her she goes to store sometimes into some shopping.  Denies having any chest pain but does have some exertional shortness of breath.  No proximal nocturnal dyspnea.  Past Medical History:  Diagnosis Date  . Arthritis   . Cancer ([place])   . Hypertension     Past Surgical History:  Procedure Laterality Date  . JOINT REPLACEMENT    . STOMACH SURGERY      Current Medications: Current Meds  Medication Sig  . AMBULATORY NON FORMULARY MEDICATION  Home TENS unit, please use up to TID for 30 mins, this is medically necessary.  Marland Kitchen amLODipine (NORVASC) 10 MG tablet Take 1 tablet (10 mg total) by mouth daily.  . carvedilol (COREG) 25 MG tablet Take 1 tablet (25 mg total) by mouth 2 (two) times daily with a meal.  . Cholecalciferol (VITAMIN D3) 50000 units CAPS Take 1 tablet by mouth daily.  . hydrALAZINE (APRESOLINE) 25 MG tablet Take 1 tablet (25 mg total) by mouth 2 (two) times daily.  . hydrochlorothiazide (HYDRODIURIL) 12.5 MG tablet Take 1 tablet (12.5 mg total) by mouth daily.  Marland Kitchen HYDROcodone-acetaminophen (NORCO) 7.5-325 MG tablet Take 1 tablet by mouth every 6 (six) hours as needed for moderate pain. Use sparingly. #60 for 30(thirty) days  . hydrOXYzine (ATARAX/VISTARIL) 10 MG tablet Take 10-20 mg by mouth 3 (three) times daily as needed for anxiety.  Marland Kitchen losartan (COZAAR) 100 MG tablet Take 1 tablet (100 mg total) by mouth daily.  . mometasone (ELOCON) 0.1 % cream Apply 1 application topically daily.  . mupirocin ointment (BACTROBAN) 2 % Place 1 application into the nose 2 (two) times daily.  . Omega-3 Fatty Acids ([employer] FISH OIL PO) Take by mouth.  . topiramate (TOPAMAX) 100 MG tablet Take 100 mg by mouth 2 (two) times daily.  Marland Kitchen warfarin (COUMADIN) 5 MG tablet Take 1 tablet (5 mg total) by mouth daily.  Marland Kitchen warfarin (COUMADIN) 7.5 MG tablet TAKE 1 AND 1/2  TABLETS DAILY EVERY SUNDAY, TUESDAY, THURSDAY THEN TAKE 1 TABLET EVERY MONDAY, WEDNESDAY, FRIDAY AND SATURDAY     Allergies:   Cefuroxime; Diazepam; Diphenhydramine; Latex; Other; Amlodipine; Butorphanol; Diclofenac; Diclofenac sodium; Diphenhydramine hcl; Methylpyrrolidone; Ondansetron; Oxycodone; Penicillins; Quinolones; Sulfa antibiotics; Butorphanol tartrate; Ciprofloxacin; Lisinopril; Ramipril; Ace inhibitors; Codeine; and Pantoprazole sodium   Social History   Socioeconomic History  . Marital status: Widowed    Spouse name: Not on file  . Number of children: Not on file  . Years  of education: Not on file  . Highest education level: Not on file  Occupational History  . Not on file  Social Needs  . Financial resource strain: Not on file  . Food insecurity:    Worry: Not on file    Inability: Not on file  . Transportation needs:    Medical: Not on file    Non-medical: Not on file  Tobacco Use  . Smoking status: Never Smoker  . Smokeless tobacco: Never Used  Substance and Sexual Activity  . Alcohol use: No  . Drug use: No  . Sexual activity: Not on file  Lifestyle  . Physical activity:    Days per week: Not on file    Minutes per session: Not on file  . Stress: Not on file  Relationships  . Social connections:    Talks on phone: Not on file    Gets together: Not on file    Attends religious service: Not on file    Active member of club or organization: Not on file    Attends meetings of clubs or organizations: Not on file    Relationship status: Not on file  Other Topics Concern  . Not on file  Social History Narrative  . Not on file     Family History: The patient's family history includes Heart disease in her father; Hypertension in her mother; Pulmonary embolism in her mother. ROS:   Please see the history of present illness.    All 14 point review of systems negative except as described per history of present illness.  EKGs/Labs/Other Studies Reviewed:    The following studies were reviewed today:   EKG:  EKG is  ordered today.  The ekg ordered today demonstrates sinus bradycardia rate is 59 bpm normal P interval there is a Q waves in lead V1 to V3 there is also embryonic Q waves in lead II and aVF.  Incomplete right bundle branch block.  Recent Labs: 06/22/2017: Hemoglobin 11.0; Platelets 190 02/28/2018: ALT 17; BUN 29; Creat 1.62; Potassium 4.4; Sodium 143; TSH 1.91  Recent Lipid Panel No results found for: CHOL, TRIG, HDL, CHOLHDL, VLDL, LDLCALC, LDLDIRECT  Physical Exam:    VS:  BP 100/60   Pulse 73   Ht 5\' 3"  (1.6 m)   Wt 113  lb 12.8 oz (51.6 kg)   SpO2 98%   BMI 20.16 kg/m     Wt Readings from Last 3 Encounters:  03/17/18 113 lb 12.8 oz (51.6 kg)  03/07/18 112 lb (50.8 kg)  02/28/18 112 lb 4.8 oz (50.9 kg)     GEN:  Well nourished, well developed in no acute distress HEENT: Normal NECK: No JVD; No carotid bruits LYMPHATICS: No lymphadenopathy CARDIAC: RRR, no murmurs, no rubs, no gallops RESPIRATORY:  Clear to auscultation without rales, wheezing or rhonchi  ABDOMEN: Soft, non-tender, non-distended MUSCULOSKELETAL:  No edema; No deformity  SKIN: Warm and dry NEUROLOGIC:  Alert and oriented x 3 PSYCHIATRIC:  Normal affect  ASSESSMENT:    1. Paroxysmal atrial fibrillation (HCC)   2. Benign hypertension with CKD (chronic kidney disease) stage III ([place])   3. CKD (chronic kidney disease) stage 3, GFR 30-59 ml/min (HCC)   4. History of DVT of lower extremity   5. Long term current use of anticoagulant therapy    PLAN:    In order of problems listed above:  1. Paroxysmal atrial fibrillation she is maintaining sinus rhythm today she is anticoagulated with Coumadin because of her chads 2 Vascor equals 5.  Will continue.  I do have some reservations about continuation of antithrombotic therapy because of her frequent falls therefore I think it would be reasonable to try to determine more accurately frequency and duration of atrial fibrillation.  Therefore, asked her to wear Holter monitor for 48 hours.  As a part of evaluation I will also do echocardiogram reason for that is to assess left ventricular ejection fraction to confirm if she truly had anterior septal wall myocardial infarction on top of that will look at the left atrial size.  In the meantime I will continue with beta-blocker however based on Holter monitor I anticipate needs to decrease dose of carvedilol. 2. Benign essential hypertension she reports quite fluctuation of her blood pressure I have asked her to check her blood pressure in the  regular basis and bring results to me. 3. Chronic renal failure followed by internal medicine team.  Apparently stable 4. History of DVT of lower extremities with PE my understanding is that happen around surgical time therefore I think we need to assess this event as provoked however I will have to do some more research in her chart trying to clarified that 5. Long-term anticoagulation therapy.  For now we will continue with reservation as described above.  In the future we may be forced to discontinue anticoagulation if she continued to fall down and then we may consider watchman device   Medication Adjustments/Labs and Tests Ordered: Current medicines are reviewed at length with the patient today.  Concerns regarding medicines are outlined above.  No orders of the defined types were placed in this encounter.  No orders of the defined types were placed in this encounter.   Signed, Park Liter, MD, [facility]. 03/17/2018 3:56 PM    [place]

## 2018-03-21 ENCOUNTER — Ambulatory Visit (INDEPENDENT_AMBULATORY_CARE_PROVIDER_SITE_OTHER): Payer: Medicare Other | Admitting: Sports Medicine

## 2018-03-21 ENCOUNTER — Encounter: Payer: Medicare Other | Admitting: Sports Medicine

## 2018-03-21 ENCOUNTER — Ambulatory Visit (INDEPENDENT_AMBULATORY_CARE_PROVIDER_SITE_OTHER): Payer: Medicare Other

## 2018-03-21 ENCOUNTER — Encounter: Payer: Self-pay | Admitting: Sports Medicine

## 2018-03-21 VITALS — BP 144/64 | HR 52 | Temp 97.7°F | Wt 112.0 lb

## 2018-03-21 DIAGNOSIS — M4850XA Collapsed vertebra, not elsewhere classified, site unspecified, initial encounter for fracture: Secondary | ICD-10-CM | POA: Diagnosis not present

## 2018-03-21 DIAGNOSIS — Z7901 Long term (current) use of anticoagulants: Secondary | ICD-10-CM

## 2018-03-21 DIAGNOSIS — I480 Paroxysmal atrial fibrillation: Secondary | ICD-10-CM

## 2018-03-21 DIAGNOSIS — M4316 Spondylolisthesis, lumbar region: Secondary | ICD-10-CM | POA: Diagnosis not present

## 2018-03-21 DIAGNOSIS — M7061 Trochanteric bursitis, right hip: Secondary | ICD-10-CM | POA: Diagnosis not present

## 2018-03-21 DIAGNOSIS — D6859 Other primary thrombophilia: Secondary | ICD-10-CM | POA: Diagnosis not present

## 2018-03-21 DIAGNOSIS — I824Y9 Acute embolism and thrombosis of unspecified deep veins of unspecified proximal lower extremity: Secondary | ICD-10-CM

## 2018-03-21 DIAGNOSIS — M545 Low back pain: Secondary | ICD-10-CM | POA: Diagnosis not present

## 2018-03-21 DIAGNOSIS — W108XXA Fall (on) (from) other stairs and steps, initial encounter: Secondary | ICD-10-CM | POA: Diagnosis not present

## 2018-03-21 DIAGNOSIS — M25551 Pain in right hip: Secondary | ICD-10-CM | POA: Insufficient documentation

## 2018-03-21 LAB — POCT INR: INR: 1.5 — AB (ref 2.0–3.0)

## 2018-03-21 NOTE — Progress Notes (Signed)
This encounter was created in error - please disregard.

## 2018-03-21 NOTE — Assessment & Plan Note (Addendum)
Pain over the lower lumbar spinous processes, x-ray to ensure no vertebral compression fracture. L3, possible L4 as well, minimal loss of height without any evidence of bony retropulsion of fracture fragments. She has known osteoporosis on a DEXA scan from about 2 years ago, not being treated, we did discuss Fosamax, she is going to think about it and let us know, continue calcium/vitamin D for now. She seems a bit overwhelmed with everything that were talking about today so we will revisit this in a month.

## 2018-03-21 NOTE — Assessment & Plan Note (Signed)
Continue to Coumadin at current dose, recheck in 2 weeks and a nurse visit.

## 2018-03-21 NOTE — Assessment & Plan Note (Signed)
Injection as above, rehab exercises given. Return in 1 month.

## 2018-03-21 NOTE — Progress Notes (Signed)
Pt is here for INR check. As per pt she stopped taking her warfarin medication due to a procedure from 03/15/18 & restarted on today. Pt states as per external doctor, no longer taking hydrochlorothiazide med. Pt currently taking warfarin 7.5 mg - Tues/Wed/Fri/Sun & 5 mg Mon/Thur/Sat. Blood pressure reading was 151/80, pulse 56. Pt sat for 10 mins, second blood pressure reading was 144/64, 52. Pt has an appt with Dr. Darene Lamer today for sport medicine and is aware of INR results.

## 2018-03-21 NOTE — Progress Notes (Signed)
It is listed in the anticoagulation tracking as 7.5 mg every day.  It was listed this way back when her INR was therapeutic.  What was she taking back when it was therapeutic?  What ever she was taking when it was therapeutic needs to be updated in the anticoagulation tracking calendar.

## 2018-03-21 NOTE — Progress Notes (Signed)
Pt currently taking warfarin as noted - 7.5 MG Tues/Wed/Fri/Sun & 5 MG Mon/Thurs/Sat. Pls confirm if pt is to continue current dosing or should pt take 7.5 mg daily? Thanks.

## 2018-03-21 NOTE — Progress Notes (Signed)
Subjective:    CC: Recent fall  HPI: This is a pleasant 77 year old female, history of osteoporosis not on bisphosphonate therapy, noted on DEXA scan in 2017 with [facility].  She fell down the stairs directly on her buttock, had some immediate left hip pain, over the next few days she started to have pain in the midline of her low back, as well as her right lateral hip.  Moderate, persistent.  In addition she has recently been off of her Coumadin for a procedure, restarted, somewhat subtherapeutic.  I reviewed the past medical history, family history, social history, surgical history, and allergies today and no changes were needed.  Please see the problem list section below in epic for further details.  Past Medical History: Past Medical History:  Diagnosis Date  . Arthritis   . Cancer ([place])   . Hypertension    Past Surgical History: Past Surgical History:  Procedure Laterality Date  . JOINT REPLACEMENT    . STOMACH SURGERY     Social History: Social History   Socioeconomic History  . Marital status: Widowed    Spouse name: Not on file  . Number of children: Not on file  . Years of education: Not on file  . Highest education level: Not on file  Occupational History  . Not on file  Social Needs  . Financial resource strain: Not on file  . Food insecurity:    Worry: Not on file    Inability: Not on file  . Transportation needs:    Medical: Not on file    Non-medical: Not on file  Tobacco Use  . Smoking status: Never Smoker  . Smokeless tobacco: Never Used  Substance and Sexual Activity  . Alcohol use: No  . Drug use: No  . Sexual activity: Not on file  Lifestyle  . Physical activity:    Days per week: Not on file    Minutes per session: Not on file  . Stress: Not on file  Relationships  . Social connections:    Talks on phone: Not on file    Gets together: Not on file    Attends religious service: Not on file    Active member of club or organization: Not on  file    Attends meetings of clubs or organizations: Not on file    Relationship status: Not on file  Other Topics Concern  . Not on file  Social History Narrative  . Not on file   Family History: Family History  Problem Relation Age of Onset  . Hypertension Mother   . Pulmonary embolism Mother   . Heart disease Father    Allergies: Allergies  Allergen Reactions  . Cefuroxime Anaphylaxis    Difficulty swallowing pills because they were too dry.  Caused tablet dysphagia.  . Diazepam Shortness Of Breath    HYPERVENTILATION     . Diphenhydramine Palpitations    Affected heart rate/er told her not to take again  . Latex Rash and Shortness Of Breath    Airway swelling  . Other Palpitations    Affected heart rate/er told her not to take again  . Amlodipine Cough and Other (See Comments)    Not sure  . Butorphanol Hives  . Diclofenac Hives  . Diclofenac Sodium Hives  . Diphenhydramine Hcl Palpitations    Affected heart rate/er told her not to take again  . Methylpyrrolidone Hives  . Ondansetron Hives and Other (See Comments)    Sedates/knocks her out   .  Oxycodone Hives  . Penicillins Hives and Swelling    Most mycin drugs  . Quinolones Other (See Comments)    Joint pain  . Sulfa Antibiotics Hives and Swelling  . Butorphanol Tartrate Hives  . Ciprofloxacin     Patient prefers not to take Fluoroquinolones  . Lisinopril Cough    Caused pt to cough  . Ramipril Cough    Pt c/o cough  . Ace Inhibitors Other (See Comments) and Cough    Lisinopril and ramipril   . Codeine Nausea Only  . Pantoprazole Sodium Diarrhea   Medications: See med rec.  Review of Systems: No fevers, chills, night sweats, weight loss, chest pain, or shortness of breath.   Objective:    General: Well Developed, well nourished, and in no acute distress.  Neuro: Alert and oriented x3, extra-ocular muscles intact, sensation grossly intact.  HEENT: Normocephalic, atraumatic, pupils equal round  reactive to light, neck supple, no masses, no lymphadenopathy, thyroid nonpalpable.  Skin: Warm and dry, no rashes. Cardiac: Regular rate and rhythm, no murmurs rubs or gallops, no lower extremity edema.  Respiratory: Clear to auscultation bilaterally. Not using accessory muscles, speaking in full sentences. Back Exam:  Inspection: Unremarkable  Motion: Flexion 45 deg, Extension 45 deg, Side Bending to 45 deg bilaterally,  Rotation to 45 deg bilaterally  SLR laying: Negative  XSLR laying: Negative  Palpable tenderness: Lower lumbar spinous processes. FABER: negative. Sensory change: Gross sensation intact to all lumbar and sacral dermatomes.  Reflexes: 2+ at both patellar tendons, 2+ at achilles tendons, Babinski's downgoing.  Strength at foot  Plantar-flexion: 5/5 Dorsi-flexion: 5/5 Eversion: 5/5 Inversion: 5/5  Leg strength  Quad: 5/5 Hamstring: 5/5 Hip flexor: 5/5 Hip abductors: 5/5  Gait unremarkable. Right hip: ROM IR: 60 Deg, ER: 60 Deg, Flexion: 120 Deg, Extension: 100 Deg, Abduction: 45 Deg, Adduction: 45 Deg Strength IR: 5/5, ER: 5/5, Flexion: 5/5, Extension: 5/5, Abduction: 5/5, Adduction: 5/5 Pelvic alignment unremarkable to inspection and palpation. Standing hip rotation and gait without trendelenburg / unsteadiness. Greater trochanter with tenderness to palpation. No tenderness over piriformis. No SI joint tenderness and normal minimal SI movement.  Procedure: Real-time Ultrasound Guided Injection of right greater trochanter bursa Device: GE Logiq E  Verbal informed consent obtained.  Time-out conducted.  Noted no overlying erythema, induration, or other signs of local infection.  Skin prepped in a sterile fashion.  Local anesthesia: Topical Ethyl chloride.  With sterile technique and under real time ultrasound guidance: Using a 22-gauge spinal needle advanced towards the greater trochanter, contacted bone and injected 1 cc kenalog 40, 2 cc lidocaine, 2 cc  bupivacaine Completed without difficulty  Pain immediately resolved suggesting accurate placement of the medication.  Advised to call if fevers/chills, erythema, induration, drainage, or persistent bleeding.  Images permanently stored and available for review in the ultrasound unit.  Impression: Technically successful ultrasound guided injection.  Impression and Recommendations:    Vertebral compression fracture (HCC) Pain over the lower lumbar spinous processes, x-ray to ensure no vertebral compression fracture. L3, possible L4 as well, minimal loss of height without any evidence of bony retropulsion of fracture fragments. She has known osteoporosis on a DEXA scan from about 2 years ago, not being treated, we did discuss Fosamax, she is going to think about it and let us know, continue calcium/vitamin D for now. She seems a bit overwhelmed with everything that were talking about today so we will revisit this in a month.  Greater trochanteric bursitis of right hip  Injection as above, rehab exercises given. Return in 1 month.  Long term (current) use of anticoagulants [Z79.01] Continue to Coumadin at current dose, recheck in 2 weeks and a nurse visit.  ___________________________________________ Gwen Her. Dianah Field, M.D., ABFM., CAQSM. Primary Care and [place] Instructor of [employer] [facility]

## 2018-03-22 ENCOUNTER — Encounter: Payer: Self-pay | Admitting: Family Medicine

## 2018-03-22 NOTE — Progress Notes (Signed)
Records received from gastroenterology.  Upper endoscopy revealed a small hiatal hernia with esophageal stricture that was dilated. Colonoscopy showed left-sided diverticulosis and retained solid fecal material.  Concerned that the diarrhea may have been overflow diarrhea.  Gastrology recommends regular use of MiraLAX after colonoscopy.  Documentation will be placed in Dr. Redgie Grayer in basket for her to review.    The colonoscopy documentation as well as the letter from gastroneurology will be sent to scan and abstracted.

## 2018-03-23 NOTE — Progress Notes (Signed)
Tanna Furry spoke with Pt, she verified the Pt is taking "7.5 MG Tues/Wed/Fri/Sun & 5 MG Mon/Thurs/Sat." Flowsheet updated. Routing back to verify what dose Pt should be taking now

## 2018-03-23 NOTE — Progress Notes (Signed)
Noted. Pt has been updated.

## 2018-03-23 NOTE — Progress Notes (Signed)
Thank you, she will continue that dosing regimen, and we can recheck in 2 weeks, thank you for updating the flowsheet.

## 2018-03-28 ENCOUNTER — Ambulatory Visit (INDEPENDENT_AMBULATORY_CARE_PROVIDER_SITE_OTHER): Payer: Medicare Other | Admitting: Osteopathic Medicine

## 2018-03-28 DIAGNOSIS — I824Y9 Acute embolism and thrombosis of unspecified deep veins of unspecified proximal lower extremity: Secondary | ICD-10-CM

## 2018-03-28 DIAGNOSIS — I480 Paroxysmal atrial fibrillation: Secondary | ICD-10-CM

## 2018-03-28 DIAGNOSIS — Z7901 Long term (current) use of anticoagulants: Secondary | ICD-10-CM | POA: Diagnosis not present

## 2018-03-28 DIAGNOSIS — D6859 Other primary thrombophilia: Secondary | ICD-10-CM

## 2018-03-28 LAB — POCT INR: INR: 1.4 — AB (ref 2–3)

## 2018-03-29 NOTE — Progress Notes (Signed)
Continuously labile INR.  The comp located by having been off of this medicine due to surgical procedure adjustments made again today, will recheck in 1 week.

## 2018-03-30 ENCOUNTER — Other Ambulatory Visit: Payer: Self-pay

## 2018-03-31 ENCOUNTER — Ambulatory Visit: Payer: Medicare Other | Admitting: Cardiology

## 2018-04-03 ENCOUNTER — Ambulatory Visit: Payer: Medicare Other

## 2018-04-03 ENCOUNTER — Ambulatory Visit (HOSPITAL_BASED_OUTPATIENT_CLINIC_OR_DEPARTMENT_OTHER)
Admission: RE | Admit: 2018-04-03 | Discharge: 2018-04-03 | Disposition: A | Discharge status: Home / Self Care | Payer: Medicare Other | Source: Ambulatory Visit | Attending: Osteopathic Medicine | Admitting: Osteopathic Medicine

## 2018-04-03 DIAGNOSIS — I340 Nonrheumatic mitral (valve) insufficiency: Secondary | ICD-10-CM | POA: Insufficient documentation

## 2018-04-03 DIAGNOSIS — I480 Paroxysmal atrial fibrillation: Secondary | ICD-10-CM

## 2018-04-03 DIAGNOSIS — N183 Chronic kidney disease, stage 3 (moderate): Secondary | ICD-10-CM

## 2018-04-03 DIAGNOSIS — I129 Hypertensive chronic kidney disease with stage 1 through stage 4 chronic kidney disease, or unspecified chronic kidney disease: Secondary | ICD-10-CM

## 2018-04-03 NOTE — Progress Notes (Signed)
  Echocardiogram 2D Echocardiogram has been performed.  Gloria Mckay 04/03/2018, 1:28 PM

## 2018-04-04 ENCOUNTER — Ambulatory Visit (INDEPENDENT_AMBULATORY_CARE_PROVIDER_SITE_OTHER): Payer: Medicare Other | Admitting: Osteopathic Medicine

## 2018-04-04 DIAGNOSIS — Z7901 Long term (current) use of anticoagulants: Secondary | ICD-10-CM | POA: Diagnosis not present

## 2018-04-04 DIAGNOSIS — D6859 Other primary thrombophilia: Secondary | ICD-10-CM

## 2018-04-04 DIAGNOSIS — I824Y9 Acute embolism and thrombosis of unspecified deep veins of unspecified proximal lower extremity: Secondary | ICD-10-CM

## 2018-04-04 DIAGNOSIS — I480 Paroxysmal atrial fibrillation: Secondary | ICD-10-CM

## 2018-04-04 LAB — POCT INR: INR: 2.3 (ref 2.0–3.0)

## 2018-04-04 NOTE — Progress Notes (Signed)
Okay to maintain current dose.  Since INR is so variable with her, will recheck in 1 week just to ensure that it is stable, if it still good at current dose, can go to every 2 weeks

## 2018-04-05 ENCOUNTER — Encounter (INDEPENDENT_AMBULATORY_CARE_PROVIDER_SITE_OTHER): Payer: Self-pay

## 2018-04-05 DIAGNOSIS — T8459XD Infection and inflammatory reaction due to other internal joint prosthesis, subsequent encounter: Secondary | ICD-10-CM | POA: Diagnosis not present

## 2018-04-05 DIAGNOSIS — T8450XS Infection and inflammatory reaction due to unspecified internal joint prosthesis, sequela: Secondary | ICD-10-CM | POA: Diagnosis not present

## 2018-04-05 DIAGNOSIS — R05 Cough: Secondary | ICD-10-CM | POA: Diagnosis not present

## 2018-04-05 DIAGNOSIS — R845 Abnormal microbiological findings in specimens from respiratory organs and thorax: Secondary | ICD-10-CM | POA: Diagnosis not present

## 2018-04-05 DIAGNOSIS — D803 Selective deficiency of immunoglobulin G [IgG] subclasses: Secondary | ICD-10-CM | POA: Diagnosis not present

## 2018-04-05 DIAGNOSIS — Z96659 Presence of unspecified artificial knee joint: Secondary | ICD-10-CM | POA: Diagnosis not present

## 2018-04-05 DIAGNOSIS — J470 Bronchiectasis with acute lower respiratory infection: Secondary | ICD-10-CM | POA: Diagnosis not present

## 2018-04-11 ENCOUNTER — Ambulatory Visit (INDEPENDENT_AMBULATORY_CARE_PROVIDER_SITE_OTHER): Payer: Medicare Other | Admitting: Osteopathic Medicine

## 2018-04-11 DIAGNOSIS — D6859 Other primary thrombophilia: Secondary | ICD-10-CM

## 2018-04-11 DIAGNOSIS — Z7901 Long term (current) use of anticoagulants: Secondary | ICD-10-CM

## 2018-04-11 DIAGNOSIS — I480 Paroxysmal atrial fibrillation: Secondary | ICD-10-CM

## 2018-04-11 DIAGNOSIS — I824Y9 Acute embolism and thrombosis of unspecified deep veins of unspecified proximal lower extremity: Secondary | ICD-10-CM

## 2018-04-11 LAB — POCT INR: INR: 2.9 (ref 2.0–3.0)

## 2018-04-12 ENCOUNTER — Other Ambulatory Visit: Payer: Self-pay | Admitting: Osteopathic Medicine

## 2018-04-12 DIAGNOSIS — G8928 Other chronic postprocedural pain: Secondary | ICD-10-CM

## 2018-04-12 MED ORDER — HYDROCODONE-ACETAMINOPHEN 7.5-325 MG PO TABS: 1.0000 | ORAL_TABLET | Freq: Four times a day (QID) | ORAL | 0 refills | Status: DC | PRN

## 2018-04-12 NOTE — Telephone Encounter (Signed)
Patient called and is requesting a refill of her Hydrocodone. Pharmacy on file is correct. Please fill.

## 2018-04-13 NOTE — Telephone Encounter (Signed)
Patient notified that refill was sent and voices understanding.

## 2018-04-21 ENCOUNTER — Ambulatory Visit (INDEPENDENT_AMBULATORY_CARE_PROVIDER_SITE_OTHER): Payer: Medicare Other | Admitting: Sports Medicine

## 2018-04-21 ENCOUNTER — Encounter: Payer: Self-pay | Admitting: Sports Medicine

## 2018-04-21 ENCOUNTER — Ambulatory Visit: Payer: Medicare Other

## 2018-04-21 VITALS — BP 146/79 | HR 64

## 2018-04-21 DIAGNOSIS — I824Y9 Acute embolism and thrombosis of unspecified deep veins of unspecified proximal lower extremity: Secondary | ICD-10-CM

## 2018-04-21 DIAGNOSIS — I480 Paroxysmal atrial fibrillation: Secondary | ICD-10-CM

## 2018-04-21 DIAGNOSIS — D6859 Other primary thrombophilia: Secondary | ICD-10-CM

## 2018-04-21 DIAGNOSIS — Z7901 Long term (current) use of anticoagulants: Secondary | ICD-10-CM

## 2018-04-21 DIAGNOSIS — M7061 Trochanteric bursitis, right hip: Secondary | ICD-10-CM | POA: Diagnosis not present

## 2018-04-21 LAB — POCT INR: INR: 2.7 (ref 2.0–3.0)

## 2018-04-21 NOTE — Progress Notes (Signed)
Subjective:    CC: Follow-up  HPI: Right greater trochanteric bursitis: Injected the last visit, she had good relief of her pain.  Continues to desire a nonpharmacologic approach.  She is agreeable to do home health physical therapy.  I reviewed the past medical history, family history, social history, surgical history, and allergies today and no changes were needed.  Please see the problem list section below in epic for further details.  Past Medical History: Past Medical History:  Diagnosis Date  . Arthritis   . Cancer ([place])   . Hypertension    Past Surgical History: Past Surgical History:  Procedure Laterality Date  . JOINT REPLACEMENT    . STOMACH SURGERY     Social History: Social History   Socioeconomic History  . Marital status: Widowed    Spouse name: Not on file  . Number of children: Not on file  . Years of education: Not on file  . Highest education level: Not on file  Occupational History  . Not on file  Social Needs  . Financial resource strain: Not on file  . Food insecurity:    Worry: Not on file    Inability: Not on file  . Transportation needs:    Medical: Not on file    Non-medical: Not on file  Tobacco Use  . Smoking status: Never Smoker  . Smokeless tobacco: Never Used  Substance and Sexual Activity  . Alcohol use: No  . Drug use: No  . Sexual activity: Not on file  Lifestyle  . Physical activity:    Days per week: Not on file    Minutes per session: Not on file  . Stress: Not on file  Relationships  . Social connections:    Talks on phone: Not on file    Gets together: Not on file    Attends religious service: Not on file    Active member of club or organization: Not on file    Attends meetings of clubs or organizations: Not on file    Relationship status: Not on file  Other Topics Concern  . Not on file  Social History Narrative  . Not on file   Family History: Family History  Problem Relation Age of Onset  . Hypertension  Mother   . Pulmonary embolism Mother   . Heart disease Father    Allergies: Allergies  Allergen Reactions  . Cefuroxime Anaphylaxis    Difficulty swallowing pills because they were too dry.  Caused tablet dysphagia.  . Diazepam Shortness Of Breath    HYPERVENTILATION     . Diphenhydramine Palpitations    Affected heart rate/er told her not to take again  . Latex Rash and Shortness Of Breath    Airway swelling  . Other Palpitations    Affected heart rate/er told her not to take again  . Amlodipine Cough and Other (See Comments)    Not sure  . Butorphanol Hives  . Diclofenac Hives  . Diclofenac Sodium Hives  . Diphenhydramine Hcl Palpitations    Affected heart rate/er told her not to take again  . Methylpyrrolidone Hives  . Ondansetron Hives and Other (See Comments)    Sedates/knocks her out   . Oxycodone Hives  . Penicillins Hives and Swelling    Most mycin drugs  . Quinolones Other (See Comments)    Joint pain  . Sulfa Antibiotics Hives and Swelling  . Butorphanol Tartrate Hives  . Ciprofloxacin     Patient prefers not to take Fluoroquinolones  .  Lisinopril Cough    Caused pt to cough  . Ramipril Cough    Pt c/o cough  . Ace Inhibitors Other (See Comments) and Cough    Lisinopril and ramipril   . Codeine Nausea Only  . Pantoprazole Sodium Diarrhea   Medications: See med rec.  Review of Systems: No fevers, chills, night sweats, weight loss, chest pain, or shortness of breath.   Objective:    General: Well Developed, well nourished, and in no acute distress.  Neuro: Alert and oriented x3, extra-ocular muscles intact, sensation grossly intact.  HEENT: Normocephalic, atraumatic, pupils equal round reactive to light, neck supple, no masses, no lymphadenopathy, thyroid nonpalpable.  Skin: Warm and dry, no rashes. Cardiac: Regular rate and rhythm, no murmurs rubs or gallops, no lower extremity edema.  Respiratory: Clear to auscultation bilaterally. Not using  accessory muscles, speaking in full sentences.  Impression and Recommendations:    Greater trochanteric bursitis of right hip Good improvement after injection at the last visit. I am going to set her up at home health physical therapy, I would like him to work on her hip abductors on the right, as well as cervical and lumbar spine. [place] does desire to take a nonpharmacologic approach which I think is appropriate. We certainly have multiple other tricks of our sleeve such as epidurals should we need.  Long term (current) use of anticoagulants [Z79.01] Therapeutic, recheck with PCP in a nurse visit in 4 weeks.  I spent 25 minutes with this patient, greater than 50% was face-to-face time counseling regarding the above diagnoses ___________________________________________ Gwen Her. Dianah Field, M.D., ABFM., CAQSM. Primary Care and [place] Instructor of [place] [employer] [facility] [employer]

## 2018-04-21 NOTE — Assessment & Plan Note (Signed)
Good improvement after injection at the last visit. I am going to set her up at home health physical therapy, I would like him to work on her hip abductors on the right, as well as cervical and lumbar spine. Gloria Mckay does desire to take a nonpharmacologic approach which I think is appropriate. We certainly have multiple other tricks of our sleeve such as epidurals should we need.

## 2018-04-21 NOTE — Assessment & Plan Note (Signed)
Therapeutic, recheck with PCP in a nurse visit in 4 weeks.

## 2018-04-22 DIAGNOSIS — N183 Chronic kidney disease, stage 3 (moderate): Secondary | ICD-10-CM | POA: Diagnosis not present

## 2018-04-22 DIAGNOSIS — M7060 Trochanteric bursitis, unspecified hip: Secondary | ICD-10-CM | POA: Diagnosis not present

## 2018-04-22 DIAGNOSIS — I482 Chronic atrial fibrillation: Secondary | ICD-10-CM | POA: Diagnosis not present

## 2018-04-22 DIAGNOSIS — E559 Vitamin D deficiency, unspecified: Secondary | ICD-10-CM | POA: Diagnosis not present

## 2018-04-22 DIAGNOSIS — I129 Hypertensive chronic kidney disease with stage 1 through stage 4 chronic kidney disease, or unspecified chronic kidney disease: Secondary | ICD-10-CM | POA: Diagnosis not present

## 2018-04-22 DIAGNOSIS — F3341 Major depressive disorder, recurrent, in partial remission: Secondary | ICD-10-CM | POA: Diagnosis not present

## 2018-04-24 DIAGNOSIS — I482 Chronic atrial fibrillation: Secondary | ICD-10-CM | POA: Diagnosis not present

## 2018-04-24 DIAGNOSIS — N183 Chronic kidney disease, stage 3 (moderate): Secondary | ICD-10-CM | POA: Diagnosis not present

## 2018-04-24 DIAGNOSIS — I129 Hypertensive chronic kidney disease with stage 1 through stage 4 chronic kidney disease, or unspecified chronic kidney disease: Secondary | ICD-10-CM | POA: Diagnosis not present

## 2018-04-24 DIAGNOSIS — E559 Vitamin D deficiency, unspecified: Secondary | ICD-10-CM | POA: Diagnosis not present

## 2018-04-24 DIAGNOSIS — F3341 Major depressive disorder, recurrent, in partial remission: Secondary | ICD-10-CM | POA: Diagnosis not present

## 2018-04-24 DIAGNOSIS — M7060 Trochanteric bursitis, unspecified hip: Secondary | ICD-10-CM | POA: Diagnosis not present

## 2018-04-25 ENCOUNTER — Telehealth: Payer: Self-pay | Admitting: *Deleted

## 2018-04-25 NOTE — Telephone Encounter (Signed)
Verbal orders given for home PT twice a week for 3 weeks.

## 2018-04-26 DIAGNOSIS — D638 Anemia in other chronic diseases classified elsewhere: Secondary | ICD-10-CM | POA: Diagnosis not present

## 2018-04-26 DIAGNOSIS — I129 Hypertensive chronic kidney disease with stage 1 through stage 4 chronic kidney disease, or unspecified chronic kidney disease: Secondary | ICD-10-CM | POA: Diagnosis not present

## 2018-04-26 DIAGNOSIS — N183 Chronic kidney disease, stage 3 (moderate): Secondary | ICD-10-CM | POA: Diagnosis not present

## 2018-04-27 DIAGNOSIS — I129 Hypertensive chronic kidney disease with stage 1 through stage 4 chronic kidney disease, or unspecified chronic kidney disease: Secondary | ICD-10-CM | POA: Diagnosis not present

## 2018-04-27 DIAGNOSIS — I482 Chronic atrial fibrillation: Secondary | ICD-10-CM | POA: Diagnosis not present

## 2018-04-27 DIAGNOSIS — F3341 Major depressive disorder, recurrent, in partial remission: Secondary | ICD-10-CM | POA: Diagnosis not present

## 2018-04-27 DIAGNOSIS — N183 Chronic kidney disease, stage 3 (moderate): Secondary | ICD-10-CM | POA: Diagnosis not present

## 2018-04-27 DIAGNOSIS — M7060 Trochanteric bursitis, unspecified hip: Secondary | ICD-10-CM | POA: Diagnosis not present

## 2018-04-27 DIAGNOSIS — E559 Vitamin D deficiency, unspecified: Secondary | ICD-10-CM | POA: Diagnosis not present

## 2018-04-28 DIAGNOSIS — H5319 Other subjective visual disturbances: Secondary | ICD-10-CM | POA: Diagnosis not present

## 2018-04-28 DIAGNOSIS — H353122 Nonexudative age-related macular degeneration, left eye, intermediate dry stage: Secondary | ICD-10-CM | POA: Diagnosis not present

## 2018-04-28 DIAGNOSIS — H04123 Dry eye syndrome of bilateral lacrimal glands: Secondary | ICD-10-CM | POA: Diagnosis not present

## 2018-04-28 DIAGNOSIS — H353111 Nonexudative age-related macular degeneration, right eye, early dry stage: Secondary | ICD-10-CM | POA: Diagnosis not present

## 2018-05-02 ENCOUNTER — Telehealth: Payer: Self-pay

## 2018-05-02 DIAGNOSIS — N183 Chronic kidney disease, stage 3 (moderate): Secondary | ICD-10-CM | POA: Diagnosis not present

## 2018-05-02 DIAGNOSIS — F3341 Major depressive disorder, recurrent, in partial remission: Secondary | ICD-10-CM | POA: Diagnosis not present

## 2018-05-02 DIAGNOSIS — M7060 Trochanteric bursitis, unspecified hip: Secondary | ICD-10-CM | POA: Diagnosis not present

## 2018-05-02 DIAGNOSIS — I482 Chronic atrial fibrillation: Secondary | ICD-10-CM | POA: Diagnosis not present

## 2018-05-02 DIAGNOSIS — E559 Vitamin D deficiency, unspecified: Secondary | ICD-10-CM | POA: Diagnosis not present

## 2018-05-02 DIAGNOSIS — I129 Hypertensive chronic kidney disease with stage 1 through stage 4 chronic kidney disease, or unspecified chronic kidney disease: Secondary | ICD-10-CM | POA: Diagnosis not present

## 2018-05-02 MED ORDER — CLINDAMYCIN HCL 300 MG PO CAPS: 600.0000 mg | ORAL_CAPSULE | Freq: Once | ORAL | 0 refills | Status: AC

## 2018-05-02 NOTE — Telephone Encounter (Signed)
Gloria Mckay called and states she has an upcoming dental procedure and needs pretreatment antibiotic. Please advise.

## 2018-05-02 NOTE — Telephone Encounter (Signed)
Sent!

## 2018-05-03 DIAGNOSIS — E559 Vitamin D deficiency, unspecified: Secondary | ICD-10-CM | POA: Diagnosis not present

## 2018-05-03 DIAGNOSIS — I129 Hypertensive chronic kidney disease with stage 1 through stage 4 chronic kidney disease, or unspecified chronic kidney disease: Secondary | ICD-10-CM | POA: Diagnosis not present

## 2018-05-03 DIAGNOSIS — N183 Chronic kidney disease, stage 3 (moderate): Secondary | ICD-10-CM | POA: Diagnosis not present

## 2018-05-03 DIAGNOSIS — I482 Chronic atrial fibrillation: Secondary | ICD-10-CM | POA: Diagnosis not present

## 2018-05-03 DIAGNOSIS — M7060 Trochanteric bursitis, unspecified hip: Secondary | ICD-10-CM | POA: Diagnosis not present

## 2018-05-03 DIAGNOSIS — F3341 Major depressive disorder, recurrent, in partial remission: Secondary | ICD-10-CM | POA: Diagnosis not present

## 2018-05-03 MED ORDER — CEPHALEXIN 500 MG PO CAPS: 2000.0000 mg | ORAL_CAPSULE | Freq: Once | ORAL | 0 refills | Status: AC

## 2018-05-03 NOTE — Telephone Encounter (Signed)
OK she had an allergy to cephalosporins listed in her chart  Sent keflex.Marland KitchenMarland KitchenMarland Kitchen

## 2018-05-03 NOTE — Telephone Encounter (Signed)
Called patient and notified med sent to pharmacy. KG LPN

## 2018-05-03 NOTE — Telephone Encounter (Signed)
She states she takes the clindamycin daily. Please advise. She state she usually takes Keflex before a procedure.

## 2018-05-04 ENCOUNTER — Encounter: Payer: Self-pay | Admitting: Osteopathic Medicine

## 2018-05-04 DIAGNOSIS — I129 Hypertensive chronic kidney disease with stage 1 through stage 4 chronic kidney disease, or unspecified chronic kidney disease: Secondary | ICD-10-CM | POA: Diagnosis not present

## 2018-05-04 DIAGNOSIS — F3341 Major depressive disorder, recurrent, in partial remission: Secondary | ICD-10-CM | POA: Diagnosis not present

## 2018-05-04 DIAGNOSIS — I482 Chronic atrial fibrillation: Secondary | ICD-10-CM | POA: Diagnosis not present

## 2018-05-04 DIAGNOSIS — N183 Chronic kidney disease, stage 3 (moderate): Secondary | ICD-10-CM | POA: Diagnosis not present

## 2018-05-04 DIAGNOSIS — E559 Vitamin D deficiency, unspecified: Secondary | ICD-10-CM | POA: Diagnosis not present

## 2018-05-04 DIAGNOSIS — M7060 Trochanteric bursitis, unspecified hip: Secondary | ICD-10-CM | POA: Diagnosis not present

## 2018-05-08 ENCOUNTER — Ambulatory Visit (INDEPENDENT_AMBULATORY_CARE_PROVIDER_SITE_OTHER): Payer: Medicare Other | Admitting: Cardiology

## 2018-05-08 ENCOUNTER — Encounter

## 2018-05-08 ENCOUNTER — Encounter: Payer: Self-pay | Admitting: Cardiology

## 2018-05-08 VITALS — BP 138/66 | HR 53 | Ht 63.0 in | Wt 113.4 lb

## 2018-05-08 DIAGNOSIS — I480 Paroxysmal atrial fibrillation: Secondary | ICD-10-CM

## 2018-05-08 DIAGNOSIS — I471 Supraventricular tachycardia: Secondary | ICD-10-CM | POA: Diagnosis not present

## 2018-05-08 DIAGNOSIS — M7060 Trochanteric bursitis, unspecified hip: Secondary | ICD-10-CM | POA: Diagnosis not present

## 2018-05-08 DIAGNOSIS — N183 Chronic kidney disease, stage 3 (moderate): Secondary | ICD-10-CM | POA: Diagnosis not present

## 2018-05-08 DIAGNOSIS — F3341 Major depressive disorder, recurrent, in partial remission: Secondary | ICD-10-CM | POA: Diagnosis not present

## 2018-05-08 DIAGNOSIS — R072 Precordial pain: Secondary | ICD-10-CM | POA: Diagnosis not present

## 2018-05-08 DIAGNOSIS — I824Y9 Acute embolism and thrombosis of unspecified deep veins of unspecified proximal lower extremity: Secondary | ICD-10-CM

## 2018-05-08 DIAGNOSIS — I482 Chronic atrial fibrillation: Secondary | ICD-10-CM | POA: Diagnosis not present

## 2018-05-08 DIAGNOSIS — I129 Hypertensive chronic kidney disease with stage 1 through stage 4 chronic kidney disease, or unspecified chronic kidney disease: Secondary | ICD-10-CM | POA: Diagnosis not present

## 2018-05-08 DIAGNOSIS — I4719 Other supraventricular tachycardia: Secondary | ICD-10-CM

## 2018-05-08 DIAGNOSIS — E559 Vitamin D deficiency, unspecified: Secondary | ICD-10-CM | POA: Diagnosis not present

## 2018-05-08 NOTE — Progress Notes (Signed)
Cardiology Office Note:    Date:  05/08/2018   ID:  Gloria Mckay, DOB 12-11-1940, MRN [id number]  PCP:  Emeterio Reeve, DO  Cardiologist:  Jenne Campus, MD    Referring MD: Emeterio Reeve, DO   Chief Complaint  Patient presents with  . Follow up on testing  Still having palpitations  History of Present Illness:    Gloria Mckay is a 77 y.o. female who was referred to [place] because of paralysis of atrial fibrillation.  She is being anticoagulated with Coumadin for years because of history of DVT and PE as well as paroxysmal atrial fibrillation.  I asked her to wear monitor which showed evidence of atrial tachycardia no atrial fibrillation.  Echocardiogram showed preserved left ventricular ejection fraction with normal left atrial size she has been bothered a lot by atrial tachycardia and she wants to help with that issue.  Overall she complains of being weak tired exhausted.  She went to physical therapy before visiting me today in the office she is missed her midday nap.  Past Medical History:  Diagnosis Date  . Arthritis   . Cancer ([place])   . Hypertension     Past Surgical History:  Procedure Laterality Date  . JOINT REPLACEMENT    . STOMACH SURGERY      Current Medications: Current Meds  Medication Sig  . amLODipine (NORVASC) 10 MG tablet Take 1 tablet (10 mg total) by mouth daily.  . carvedilol (COREG) 25 MG tablet Take 1 tablet (25 mg total) by mouth 2 (two) times daily with a meal.  . Cholecalciferol (VITAMIN D3) 50000 units CAPS Take 1 tablet by mouth daily.  . hydrALAZINE (APRESOLINE) 25 MG tablet Take 1 tablet (25 mg total) by mouth 2 (two) times daily.  Marland Kitchen HYDROcodone-acetaminophen (NORCO) 7.5-325 MG tablet Take 1 tablet by mouth every 6 (six) hours as needed for moderate pain. Use sparingly. #60 for 30(thirty) days  . hydrOXYzine (ATARAX/VISTARIL) 10 MG tablet Take 10-20 mg by mouth 3 (three) times daily as needed for anxiety.  Marland Kitchen losartan (COZAAR) 100 MG tablet  Take 1 tablet (100 mg total) by mouth daily.  . mometasone (ELOCON) 0.1 % cream Apply 1 application topically daily.  . mupirocin ointment (BACTROBAN) 2 % Place 1 application into the nose 2 (two) times daily.  . Omega-3 Fatty Acids ([facility] FISH OIL PO) Take by mouth.  . topiramate (TOPAMAX) 100 MG tablet Take 100 mg by mouth 2 (two) times daily.  Marland Kitchen warfarin (COUMADIN) 5 MG tablet Take 1 tablet (5 mg total) by mouth daily.  Marland Kitchen warfarin (COUMADIN) 7.5 MG tablet TAKE 1 AND 1/2 TABLETS DAILY EVERY SUNDAY, TUESDAY, THURSDAY THEN TAKE 1 TABLET EVERY MONDAY, WEDNESDAY, FRIDAY AND SATURDAY     Allergies:   Cefuroxime; Diazepam; Diphenhydramine; Latex; Other; Amlodipine; Butorphanol; Diclofenac; Diclofenac sodium; Diphenhydramine hcl; Methylpyrrolidone; Ondansetron; Oxycodone; Penicillins; Quinolones; Sulfa antibiotics; Butorphanol tartrate; Ciprofloxacin; Lisinopril; Ramipril; Ace inhibitors; Codeine; and Pantoprazole sodium   Social History   Socioeconomic History  . Marital status: Widowed    Spouse name: Not on file  . Number of children: Not on file  . Years of education: Not on file  . Highest education level: Not on file  Occupational History  . Not on file  Social Needs  . Financial resource strain: Not on file  . Food insecurity:    Worry: Not on file    Inability: Not on file  . Transportation needs:    Medical: Not on file    Non-medical:  Not on file  Tobacco Use  . Smoking status: Never Smoker  . Smokeless tobacco: Never Used  Substance and Sexual Activity  . Alcohol use: No  . Drug use: No  . Sexual activity: Not on file  Lifestyle  . Physical activity:    Days per week: Not on file    Minutes per session: Not on file  . Stress: Not on file  Relationships  . Social connections:    Talks on phone: Not on file    Gets together: Not on file    Attends religious service: Not on file    Active member of club or organization: Not on file    Attends meetings of clubs or  organizations: Not on file    Relationship status: Not on file  Other Topics Concern  . Not on file  Social History Narrative  . Not on file     Family History: The patient's family history includes Heart disease in her father; Hypertension in her mother; Pulmonary embolism in her mother. ROS:   Please see the history of present illness.    All 14 point review of systems negative except as described per history of present illness  EKGs/Labs/Other Studies Reviewed:      Recent Labs: 06/22/2017: Hemoglobin 11.0; Platelets 190 02/28/2018: ALT 17; BUN 29; Creat 1.62; Potassium 4.4; Sodium 143; TSH 1.91  Recent Lipid Panel No results found for: CHOL, TRIG, HDL, CHOLHDL, VLDL, LDLCALC, LDLDIRECT  Physical Exam:    VS:  BP 138/66   Pulse (!) 53   Ht 5\' 3"  (1.6 m)   Wt 113 lb 6.4 oz (51.4 kg)   SpO2 98%   BMI 20.09 kg/m     Wt Readings from Last 3 Encounters:  05/08/18 113 lb 6.4 oz (51.4 kg)  03/21/18 112 lb (50.8 kg)  03/21/18 112 lb (50.8 kg)     GEN:  Well nourished, well developed in no acute distress HEENT: Normal NECK: No JVD; No carotid bruits LYMPHATICS: No lymphadenopathy CARDIAC: RRR, no murmurs, no rubs, no gallops RESPIRATORY:  Clear to auscultation without rales, wheezing or rhonchi  ABDOMEN: Soft, non-tender, non-distended MUSCULOSKELETAL:  No edema; No deformity  SKIN: Warm and dry LOWER EXTREMITIES: no swelling NEUROLOGIC:  Alert and oriented x 3 PSYCHIATRIC:  Normal affect   ASSESSMENT:    1. Paroxysmal atrial fibrillation (HCC)   2. Atrial tachycardia ([place])   3. Acute venous embolism and thrombosis of deep vessels of proximal lower extremity, unspecified laterality ([place])   4. Precordial pain    PLAN:    In order of problems listed above:  1. Paroxysmal atrial fibrillation.  She is anticoagulated she is on beta-blocker which I will continue.  On the long-term Holter monitor no evidence of recurrences of atrial fibrillation. 2. Atrial  tachycardia she is already on high dose of beta-blocker.  I will refer her to EP team for consideration of antiarrhythmic therapy.  I warned her that this problem could be difficult to treat I talked to her about potentially even doing a stress test for evaluation before antiarrhythmic therapy as well as we start talking about potentially doing ablation.  However clearly EP evaluation is needed for better assessment of this problem. 3. History of DVT.  Stable on anticoagulation which I will continue. 4. Precordial chest pain mostly on the left side of her chest.  Not related to exertion can happen at rest.  Again she may require stress test in the future.   Medication Adjustments/Labs  and Tests Ordered: Current medicines are reviewed at length with the patient today.  Concerns regarding medicines are outlined above.  No orders of the defined types were placed in this encounter.  Medication changes: No orders of the defined types were placed in this encounter.   Signed, Park Liter, MD, [facility] 05/08/2018 3:47 PM    [facility]

## 2018-05-08 NOTE — Addendum Note (Signed)
Addended by: Ashok Norris on: 05/08/2018 03:56 PM   Modules accepted: Orders

## 2018-05-08 NOTE — Patient Instructions (Addendum)
Medication Instructions:  Your physician recommends that you continue on your current medications as directed. Please refer to the Current Medication list given to you today.   Labwork: None.  Testing/Procedures: None.  Follow-Up: Your physician wants you to follow-up in: 3 months. You will receive a reminder letter in the mail two months in advance. If you don't receive a letter, please call our office to schedule the follow-up appointment.   Any Other Special Instructions Will Be Listed Below (If Applicable).  You have been referred to see Dr. Curt Bears with electrophysiology.     If you need a refill on your cardiac medications before your next appointment, please call your pharmacy.

## 2018-05-09 DIAGNOSIS — I129 Hypertensive chronic kidney disease with stage 1 through stage 4 chronic kidney disease, or unspecified chronic kidney disease: Secondary | ICD-10-CM | POA: Diagnosis not present

## 2018-05-09 DIAGNOSIS — E559 Vitamin D deficiency, unspecified: Secondary | ICD-10-CM | POA: Diagnosis not present

## 2018-05-09 DIAGNOSIS — I482 Chronic atrial fibrillation: Secondary | ICD-10-CM | POA: Diagnosis not present

## 2018-05-09 DIAGNOSIS — N183 Chronic kidney disease, stage 3 (moderate): Secondary | ICD-10-CM | POA: Diagnosis not present

## 2018-05-09 DIAGNOSIS — M7060 Trochanteric bursitis, unspecified hip: Secondary | ICD-10-CM | POA: Diagnosis not present

## 2018-05-09 DIAGNOSIS — F3341 Major depressive disorder, recurrent, in partial remission: Secondary | ICD-10-CM | POA: Diagnosis not present

## 2018-05-10 ENCOUNTER — Other Ambulatory Visit: Payer: Self-pay

## 2018-05-10 DIAGNOSIS — I129 Hypertensive chronic kidney disease with stage 1 through stage 4 chronic kidney disease, or unspecified chronic kidney disease: Secondary | ICD-10-CM | POA: Diagnosis not present

## 2018-05-10 DIAGNOSIS — F3341 Major depressive disorder, recurrent, in partial remission: Secondary | ICD-10-CM | POA: Diagnosis not present

## 2018-05-10 DIAGNOSIS — G8928 Other chronic postprocedural pain: Secondary | ICD-10-CM

## 2018-05-10 DIAGNOSIS — M7060 Trochanteric bursitis, unspecified hip: Secondary | ICD-10-CM | POA: Diagnosis not present

## 2018-05-10 DIAGNOSIS — I482 Chronic atrial fibrillation: Secondary | ICD-10-CM | POA: Diagnosis not present

## 2018-05-10 DIAGNOSIS — N183 Chronic kidney disease, stage 3 (moderate): Secondary | ICD-10-CM | POA: Diagnosis not present

## 2018-05-10 DIAGNOSIS — E559 Vitamin D deficiency, unspecified: Secondary | ICD-10-CM | POA: Diagnosis not present

## 2018-05-10 MED ORDER — HYDROCODONE-ACETAMINOPHEN 7.5-325 MG PO TABS: 1.0000 | ORAL_TABLET | Freq: Four times a day (QID) | ORAL | 0 refills | Status: DC | PRN

## 2018-05-10 NOTE — Telephone Encounter (Signed)
Sent!

## 2018-05-10 NOTE — Telephone Encounter (Signed)
Pt called requesting RF on Norco be sent to [facility].  Last RX written 04-12-18  Pt requesting this be dated to fill Friday   RX pended, please review and send if appropriate   Thanks!

## 2018-05-15 DIAGNOSIS — F3341 Major depressive disorder, recurrent, in partial remission: Secondary | ICD-10-CM | POA: Diagnosis not present

## 2018-05-15 DIAGNOSIS — I129 Hypertensive chronic kidney disease with stage 1 through stage 4 chronic kidney disease, or unspecified chronic kidney disease: Secondary | ICD-10-CM | POA: Diagnosis not present

## 2018-05-15 DIAGNOSIS — E559 Vitamin D deficiency, unspecified: Secondary | ICD-10-CM | POA: Diagnosis not present

## 2018-05-15 DIAGNOSIS — N183 Chronic kidney disease, stage 3 (moderate): Secondary | ICD-10-CM | POA: Diagnosis not present

## 2018-05-15 DIAGNOSIS — I482 Chronic atrial fibrillation: Secondary | ICD-10-CM | POA: Diagnosis not present

## 2018-05-15 DIAGNOSIS — M7060 Trochanteric bursitis, unspecified hip: Secondary | ICD-10-CM | POA: Diagnosis not present

## 2018-05-18 DIAGNOSIS — I129 Hypertensive chronic kidney disease with stage 1 through stage 4 chronic kidney disease, or unspecified chronic kidney disease: Secondary | ICD-10-CM | POA: Diagnosis not present

## 2018-05-18 DIAGNOSIS — M7060 Trochanteric bursitis, unspecified hip: Secondary | ICD-10-CM | POA: Diagnosis not present

## 2018-05-18 DIAGNOSIS — N183 Chronic kidney disease, stage 3 (moderate): Secondary | ICD-10-CM | POA: Diagnosis not present

## 2018-05-18 DIAGNOSIS — F3341 Major depressive disorder, recurrent, in partial remission: Secondary | ICD-10-CM | POA: Diagnosis not present

## 2018-05-18 DIAGNOSIS — E559 Vitamin D deficiency, unspecified: Secondary | ICD-10-CM | POA: Diagnosis not present

## 2018-05-18 DIAGNOSIS — I482 Chronic atrial fibrillation: Secondary | ICD-10-CM | POA: Diagnosis not present

## 2018-05-19 ENCOUNTER — Encounter: Payer: Self-pay | Admitting: Sports Medicine

## 2018-05-19 ENCOUNTER — Ambulatory Visit (INDEPENDENT_AMBULATORY_CARE_PROVIDER_SITE_OTHER): Payer: Medicare Other | Admitting: Osteopathic Medicine

## 2018-05-19 ENCOUNTER — Telehealth: Payer: Self-pay | Admitting: *Deleted

## 2018-05-19 VITALS — BP 132/84 | HR 50 | Temp 97.8°F | Wt 111.0 lb

## 2018-05-19 DIAGNOSIS — D6859 Other primary thrombophilia: Secondary | ICD-10-CM

## 2018-05-19 DIAGNOSIS — I824Y9 Acute embolism and thrombosis of unspecified deep veins of unspecified proximal lower extremity: Secondary | ICD-10-CM

## 2018-05-19 DIAGNOSIS — I480 Paroxysmal atrial fibrillation: Secondary | ICD-10-CM

## 2018-05-19 DIAGNOSIS — M4807 Spinal stenosis, lumbosacral region: Secondary | ICD-10-CM

## 2018-05-19 DIAGNOSIS — I482 Chronic atrial fibrillation: Secondary | ICD-10-CM | POA: Diagnosis not present

## 2018-05-19 DIAGNOSIS — I129 Hypertensive chronic kidney disease with stage 1 through stage 4 chronic kidney disease, or unspecified chronic kidney disease: Secondary | ICD-10-CM | POA: Diagnosis not present

## 2018-05-19 DIAGNOSIS — N183 Chronic kidney disease, stage 3 (moderate): Secondary | ICD-10-CM | POA: Diagnosis not present

## 2018-05-19 DIAGNOSIS — Z7901 Long term (current) use of anticoagulants: Secondary | ICD-10-CM | POA: Diagnosis not present

## 2018-05-19 DIAGNOSIS — M25551 Pain in right hip: Secondary | ICD-10-CM | POA: Diagnosis not present

## 2018-05-19 DIAGNOSIS — M7060 Trochanteric bursitis, unspecified hip: Secondary | ICD-10-CM | POA: Diagnosis not present

## 2018-05-19 DIAGNOSIS — Z23 Encounter for immunization: Secondary | ICD-10-CM

## 2018-05-19 DIAGNOSIS — F3341 Major depressive disorder, recurrent, in partial remission: Secondary | ICD-10-CM | POA: Diagnosis not present

## 2018-05-19 DIAGNOSIS — E559 Vitamin D deficiency, unspecified: Secondary | ICD-10-CM | POA: Diagnosis not present

## 2018-05-19 LAB — POCT INR: INR: 2.2 (ref 2.0–3.0)

## 2018-05-19 NOTE — Assessment & Plan Note (Signed)
She had a good improvement after the injection back in July. She has been working hard with home health physical therapy. I do think her right hip pain is multifactorial but considering persistence of discomfort as well as evidence of L3-L4 spondylolisthesis on x-ray we are going to proceed at this point with a right hip MRI and a lumbar spine MRI.

## 2018-05-19 NOTE — Telephone Encounter (Signed)
Called pt and advised of recommendations. She has appt.Maryruth Eve, Lahoma Crocker, CMA

## 2018-05-19 NOTE — Progress Notes (Signed)
Pt here for INR check, she did not take wednesdays dose due to having a dentist appointment. No other missed doses, diet changes,bruising,bleeding,CP,SOB.  Pt also given flu shot. Afebrile,no recent illness. Vaccination given, pt tolerated well.Gloria Mckay, Gloria Mckay, CMA

## 2018-05-19 NOTE — Telephone Encounter (Signed)
-----   Message from Silverio Decamp, MD sent at 05/19/2018 11:24 AM EDT ----- Therapeutic, recheck in 4 weeks.  No change in dose.

## 2018-05-22 ENCOUNTER — Ambulatory Visit: Payer: Medicare Other | Admitting: Cardiology

## 2018-05-22 DIAGNOSIS — F3341 Major depressive disorder, recurrent, in partial remission: Secondary | ICD-10-CM | POA: Diagnosis not present

## 2018-05-22 DIAGNOSIS — N183 Chronic kidney disease, stage 3 (moderate): Secondary | ICD-10-CM | POA: Diagnosis not present

## 2018-05-22 DIAGNOSIS — M7060 Trochanteric bursitis, unspecified hip: Secondary | ICD-10-CM | POA: Diagnosis not present

## 2018-05-22 DIAGNOSIS — E559 Vitamin D deficiency, unspecified: Secondary | ICD-10-CM | POA: Diagnosis not present

## 2018-05-22 DIAGNOSIS — I482 Chronic atrial fibrillation: Secondary | ICD-10-CM | POA: Diagnosis not present

## 2018-05-22 DIAGNOSIS — I129 Hypertensive chronic kidney disease with stage 1 through stage 4 chronic kidney disease, or unspecified chronic kidney disease: Secondary | ICD-10-CM | POA: Diagnosis not present

## 2018-05-24 ENCOUNTER — Other Ambulatory Visit: Payer: Self-pay | Admitting: Osteopathic Medicine

## 2018-05-24 DIAGNOSIS — F3341 Major depressive disorder, recurrent, in partial remission: Secondary | ICD-10-CM | POA: Diagnosis not present

## 2018-05-24 DIAGNOSIS — E559 Vitamin D deficiency, unspecified: Secondary | ICD-10-CM | POA: Diagnosis not present

## 2018-05-24 DIAGNOSIS — I129 Hypertensive chronic kidney disease with stage 1 through stage 4 chronic kidney disease, or unspecified chronic kidney disease: Secondary | ICD-10-CM | POA: Diagnosis not present

## 2018-05-24 DIAGNOSIS — N183 Chronic kidney disease, stage 3 (moderate): Secondary | ICD-10-CM | POA: Diagnosis not present

## 2018-05-24 DIAGNOSIS — I482 Chronic atrial fibrillation: Secondary | ICD-10-CM | POA: Diagnosis not present

## 2018-05-24 DIAGNOSIS — M7060 Trochanteric bursitis, unspecified hip: Secondary | ICD-10-CM | POA: Diagnosis not present

## 2018-05-26 DIAGNOSIS — N183 Chronic kidney disease, stage 3 (moderate): Secondary | ICD-10-CM | POA: Diagnosis not present

## 2018-05-26 DIAGNOSIS — M7060 Trochanteric bursitis, unspecified hip: Secondary | ICD-10-CM | POA: Diagnosis not present

## 2018-05-26 DIAGNOSIS — I129 Hypertensive chronic kidney disease with stage 1 through stage 4 chronic kidney disease, or unspecified chronic kidney disease: Secondary | ICD-10-CM | POA: Diagnosis not present

## 2018-05-26 DIAGNOSIS — F3341 Major depressive disorder, recurrent, in partial remission: Secondary | ICD-10-CM | POA: Diagnosis not present

## 2018-05-26 DIAGNOSIS — I482 Chronic atrial fibrillation: Secondary | ICD-10-CM | POA: Diagnosis not present

## 2018-05-26 DIAGNOSIS — E559 Vitamin D deficiency, unspecified: Secondary | ICD-10-CM | POA: Diagnosis not present

## 2018-05-29 DIAGNOSIS — I482 Chronic atrial fibrillation: Secondary | ICD-10-CM | POA: Diagnosis not present

## 2018-05-29 DIAGNOSIS — M7060 Trochanteric bursitis, unspecified hip: Secondary | ICD-10-CM | POA: Diagnosis not present

## 2018-05-29 DIAGNOSIS — E559 Vitamin D deficiency, unspecified: Secondary | ICD-10-CM | POA: Diagnosis not present

## 2018-05-29 DIAGNOSIS — I129 Hypertensive chronic kidney disease with stage 1 through stage 4 chronic kidney disease, or unspecified chronic kidney disease: Secondary | ICD-10-CM | POA: Diagnosis not present

## 2018-05-29 DIAGNOSIS — N183 Chronic kidney disease, stage 3 (moderate): Secondary | ICD-10-CM | POA: Diagnosis not present

## 2018-05-29 DIAGNOSIS — F3341 Major depressive disorder, recurrent, in partial remission: Secondary | ICD-10-CM | POA: Diagnosis not present

## 2018-05-31 ENCOUNTER — Other Ambulatory Visit: Payer: Self-pay

## 2018-05-31 MED ORDER — LOSARTAN POTASSIUM 100 MG PO TABS: 100.0000 mg | ORAL_TABLET | Freq: Every day | ORAL | 0 refills | Status: DC

## 2018-05-31 MED ORDER — CARVEDILOL 25 MG PO TABS: 25.0000 mg | ORAL_TABLET | Freq: Two times a day (BID) | ORAL | 0 refills | Status: DC

## 2018-06-01 DIAGNOSIS — I129 Hypertensive chronic kidney disease with stage 1 through stage 4 chronic kidney disease, or unspecified chronic kidney disease: Secondary | ICD-10-CM | POA: Diagnosis not present

## 2018-06-01 DIAGNOSIS — M7060 Trochanteric bursitis, unspecified hip: Secondary | ICD-10-CM | POA: Diagnosis not present

## 2018-06-01 DIAGNOSIS — N183 Chronic kidney disease, stage 3 (moderate): Secondary | ICD-10-CM | POA: Diagnosis not present

## 2018-06-01 DIAGNOSIS — F3341 Major depressive disorder, recurrent, in partial remission: Secondary | ICD-10-CM | POA: Diagnosis not present

## 2018-06-01 DIAGNOSIS — E559 Vitamin D deficiency, unspecified: Secondary | ICD-10-CM | POA: Diagnosis not present

## 2018-06-01 DIAGNOSIS — I482 Chronic atrial fibrillation: Secondary | ICD-10-CM | POA: Diagnosis not present

## 2018-06-02 ENCOUNTER — Other Ambulatory Visit: Payer: Self-pay | Admitting: Osteopathic Medicine

## 2018-06-02 DIAGNOSIS — G8928 Other chronic postprocedural pain: Secondary | ICD-10-CM

## 2018-06-05 ENCOUNTER — Ambulatory Visit (INDEPENDENT_AMBULATORY_CARE_PROVIDER_SITE_OTHER): Payer: Medicare Other

## 2018-06-05 DIAGNOSIS — M545 Low back pain: Secondary | ICD-10-CM | POA: Diagnosis not present

## 2018-06-05 DIAGNOSIS — M25551 Pain in right hip: Secondary | ICD-10-CM

## 2018-06-05 DIAGNOSIS — X58XXXA Exposure to other specified factors, initial encounter: Secondary | ICD-10-CM

## 2018-06-05 DIAGNOSIS — S76011A Strain of muscle, fascia and tendon of right hip, initial encounter: Secondary | ICD-10-CM | POA: Diagnosis not present

## 2018-06-05 DIAGNOSIS — M4807 Spinal stenosis, lumbosacral region: Secondary | ICD-10-CM

## 2018-06-05 DIAGNOSIS — M4316 Spondylolisthesis, lumbar region: Secondary | ICD-10-CM | POA: Diagnosis not present

## 2018-06-05 MED ORDER — CARVEDILOL 25 MG PO TABS: 25.0000 mg | ORAL_TABLET | Freq: Two times a day (BID) | ORAL | 0 refills | Status: DC

## 2018-06-05 MED ORDER — HYDROCODONE-ACETAMINOPHEN 7.5-325 MG PO TABS: 1.0000 | ORAL_TABLET | Freq: Four times a day (QID) | ORAL | 0 refills | Status: DC | PRN

## 2018-06-05 NOTE — Telephone Encounter (Signed)
Rx sent via separate message. Pt advised she needs to have routine follow up with PCP.

## 2018-06-06 DIAGNOSIS — I482 Chronic atrial fibrillation: Secondary | ICD-10-CM | POA: Diagnosis not present

## 2018-06-06 DIAGNOSIS — M7060 Trochanteric bursitis, unspecified hip: Secondary | ICD-10-CM | POA: Diagnosis not present

## 2018-06-06 DIAGNOSIS — E559 Vitamin D deficiency, unspecified: Secondary | ICD-10-CM | POA: Diagnosis not present

## 2018-06-06 DIAGNOSIS — N183 Chronic kidney disease, stage 3 (moderate): Secondary | ICD-10-CM | POA: Diagnosis not present

## 2018-06-06 DIAGNOSIS — I129 Hypertensive chronic kidney disease with stage 1 through stage 4 chronic kidney disease, or unspecified chronic kidney disease: Secondary | ICD-10-CM | POA: Diagnosis not present

## 2018-06-06 DIAGNOSIS — F3341 Major depressive disorder, recurrent, in partial remission: Secondary | ICD-10-CM | POA: Diagnosis not present

## 2018-06-13 DIAGNOSIS — M7060 Trochanteric bursitis, unspecified hip: Secondary | ICD-10-CM | POA: Diagnosis not present

## 2018-06-13 DIAGNOSIS — E559 Vitamin D deficiency, unspecified: Secondary | ICD-10-CM | POA: Diagnosis not present

## 2018-06-13 DIAGNOSIS — I129 Hypertensive chronic kidney disease with stage 1 through stage 4 chronic kidney disease, or unspecified chronic kidney disease: Secondary | ICD-10-CM | POA: Diagnosis not present

## 2018-06-13 DIAGNOSIS — D472 Monoclonal gammopathy: Secondary | ICD-10-CM | POA: Diagnosis not present

## 2018-06-13 DIAGNOSIS — F3341 Major depressive disorder, recurrent, in partial remission: Secondary | ICD-10-CM | POA: Diagnosis not present

## 2018-06-13 DIAGNOSIS — N183 Chronic kidney disease, stage 3 (moderate): Secondary | ICD-10-CM | POA: Diagnosis not present

## 2018-06-13 DIAGNOSIS — I482 Chronic atrial fibrillation: Secondary | ICD-10-CM | POA: Diagnosis not present

## 2018-06-15 DIAGNOSIS — F3341 Major depressive disorder, recurrent, in partial remission: Secondary | ICD-10-CM | POA: Diagnosis not present

## 2018-06-15 DIAGNOSIS — M7060 Trochanteric bursitis, unspecified hip: Secondary | ICD-10-CM | POA: Diagnosis not present

## 2018-06-15 DIAGNOSIS — N183 Chronic kidney disease, stage 3 (moderate): Secondary | ICD-10-CM | POA: Diagnosis not present

## 2018-06-15 DIAGNOSIS — E559 Vitamin D deficiency, unspecified: Secondary | ICD-10-CM | POA: Diagnosis not present

## 2018-06-15 DIAGNOSIS — I129 Hypertensive chronic kidney disease with stage 1 through stage 4 chronic kidney disease, or unspecified chronic kidney disease: Secondary | ICD-10-CM | POA: Diagnosis not present

## 2018-06-15 DIAGNOSIS — I482 Chronic atrial fibrillation: Secondary | ICD-10-CM | POA: Diagnosis not present

## 2018-06-16 ENCOUNTER — Ambulatory Visit (INDEPENDENT_AMBULATORY_CARE_PROVIDER_SITE_OTHER): Payer: Medicare Other

## 2018-06-16 ENCOUNTER — Ambulatory Visit (INDEPENDENT_AMBULATORY_CARE_PROVIDER_SITE_OTHER): Payer: Medicare Other | Admitting: Sports Medicine

## 2018-06-16 ENCOUNTER — Ambulatory Visit: Payer: Medicare Other

## 2018-06-16 ENCOUNTER — Encounter: Payer: Self-pay | Admitting: Sports Medicine

## 2018-06-16 DIAGNOSIS — I129 Hypertensive chronic kidney disease with stage 1 through stage 4 chronic kidney disease, or unspecified chronic kidney disease: Secondary | ICD-10-CM | POA: Diagnosis not present

## 2018-06-16 DIAGNOSIS — R51 Headache: Secondary | ICD-10-CM

## 2018-06-16 DIAGNOSIS — I480 Paroxysmal atrial fibrillation: Secondary | ICD-10-CM | POA: Diagnosis not present

## 2018-06-16 DIAGNOSIS — M7060 Trochanteric bursitis, unspecified hip: Secondary | ICD-10-CM | POA: Diagnosis not present

## 2018-06-16 DIAGNOSIS — I824Y9 Acute embolism and thrombosis of unspecified deep veins of unspecified proximal lower extremity: Secondary | ICD-10-CM | POA: Diagnosis not present

## 2018-06-16 DIAGNOSIS — Z7901 Long term (current) use of anticoagulants: Secondary | ICD-10-CM

## 2018-06-16 DIAGNOSIS — R519 Headache, unspecified: Secondary | ICD-10-CM | POA: Insufficient documentation

## 2018-06-16 DIAGNOSIS — F3341 Major depressive disorder, recurrent, in partial remission: Secondary | ICD-10-CM | POA: Diagnosis not present

## 2018-06-16 DIAGNOSIS — D6859 Other primary thrombophilia: Secondary | ICD-10-CM

## 2018-06-16 DIAGNOSIS — I482 Chronic atrial fibrillation: Secondary | ICD-10-CM | POA: Diagnosis not present

## 2018-06-16 DIAGNOSIS — M25551 Pain in right hip: Secondary | ICD-10-CM

## 2018-06-16 DIAGNOSIS — N183 Chronic kidney disease, stage 3 (moderate): Secondary | ICD-10-CM | POA: Diagnosis not present

## 2018-06-16 DIAGNOSIS — E559 Vitamin D deficiency, unspecified: Secondary | ICD-10-CM | POA: Diagnosis not present

## 2018-06-16 LAB — POCT INR: INR: 3.5 — AB (ref 2.0–3.0)

## 2018-06-16 NOTE — Assessment & Plan Note (Signed)
Severe headache, supratherapeutic INR. Adding a stat head CT, further evaluation per PCP.

## 2018-06-16 NOTE — Assessment & Plan Note (Signed)
Supratherapeutic, patient has been eating less salads and greens than she has been eating the month before. She will go back to her usual amount of greens, and we can recheck in 2 weeks.  No change in dose today.

## 2018-06-16 NOTE — Assessment & Plan Note (Signed)
There is some high-grade partial-thickness tearing of the gluteus medius, she also has L3-L4 spinal stenosis. She does desire conservative treatment to start out with. Continue physical therapy. At a future date we may discuss epidurals versus PRP into the gluteus medius tear.

## 2018-06-16 NOTE — Progress Notes (Signed)
Subjective:    CC: Follow-up  HPI: Right hip pain: Had an MRI.  Results will be dictated below.  Slight improvement in pain with physical therapy.  She continues to desire a non-pharmacologic approach and would like to do some more PT.  She localizes her pain to the buttock, posterior lateral hip, down the leg to the foot.  Atrial fibrillation: Only on Coumadin, previous INR was therapeutic, she has been eating less solids than usual and her INR is supratherapeutic today.  Headache: Behind the right eye, is close to becoming the worst headache of her life.  Mild photophobia.  No trauma.  I reviewed the past medical history, family history, social history, surgical history, and allergies today and no changes were needed.  Please see the problem list section below in epic for further details.  Past Medical History: Past Medical History:  Diagnosis Date  . Arthritis   . Cancer ([place])   . Hypertension    Past Surgical History: Past Surgical History:  Procedure Laterality Date  . JOINT REPLACEMENT    . STOMACH SURGERY     Social History: Social History   Socioeconomic History  . Marital status: Widowed    Spouse name: Not on file  . Number of children: Not on file  . Years of education: Not on file  . Highest education level: Not on file  Occupational History  . Not on file  Social Needs  . Financial resource strain: Not on file  . Food insecurity:    Worry: Not on file    Inability: Not on file  . Transportation needs:    Medical: Not on file    Non-medical: Not on file  Tobacco Use  . Smoking status: Never Smoker  . Smokeless tobacco: Never Used  Substance and Sexual Activity  . Alcohol use: No  . Drug use: No  . Sexual activity: Not on file  Lifestyle  . Physical activity:    Days per week: Not on file    Minutes per session: Not on file  . Stress: Not on file  Relationships  . Social connections:    Talks on phone: Not on file    Gets together: Not on file      Attends religious service: Not on file    Active member of club or organization: Not on file    Attends meetings of clubs or organizations: Not on file    Relationship status: Not on file  Other Topics Concern  . Not on file  Social History Narrative  . Not on file   Family History: Family History  Problem Relation Age of Onset  . Hypertension Mother   . Pulmonary embolism Mother   . Heart disease Father    Allergies: Allergies  Allergen Reactions  . Cefuroxime Anaphylaxis    Difficulty swallowing pills because they were too dry.  Caused tablet dysphagia.  . Diazepam Shortness Of Breath    HYPERVENTILATION     . Diphenhydramine Palpitations    Affected heart rate/er told her not to take again  . Latex Rash and Shortness Of Breath    Airway swelling  . Other Palpitations    Affected heart rate/er told her not to take again  . Amlodipine Cough and Other (See Comments)    Not sure  . Butorphanol Hives  . Diclofenac Hives  . Diclofenac Sodium Hives  . Diphenhydramine Hcl Palpitations    Affected heart rate/er told her not to take again  . Methylpyrrolidone  Hives  . Ondansetron Hives and Other (See Comments)    Sedates/knocks her out   . Oxycodone Hives  . Penicillins Hives and Swelling    Most mycin drugs  . Quinolones Other (See Comments)    Joint pain  . Sulfa Antibiotics Hives and Swelling  . Butorphanol Tartrate Hives  . Ciprofloxacin     Patient prefers not to take Fluoroquinolones  . Lisinopril Cough    Caused pt to cough  . Ramipril Cough    Pt c/o cough  . Ace Inhibitors Other (See Comments) and Cough    Lisinopril and ramipril   . Codeine Nausea Only  . Pantoprazole Sodium Diarrhea   Medications: See med rec.  Review of Systems: No fevers, chills, night sweats, weight loss, chest pain, or shortness of breath.   Objective:    General: Well Developed, well nourished, and in no acute distress.  Neuro: Alert and oriented x3, extra-ocular muscles  intact, sensation grossly intact.  Cranial nerves II through XII are intact, motor, sensory, coordinative functions are all intact. HEENT: Normocephalic, atraumatic, pupils equal round reactive to light, neck supple, no masses, no lymphadenopathy, thyroid nonpalpable.  Skin: Warm and dry, no rashes. Cardiac: Regular rate and rhythm, no murmurs rubs or gallops, no lower extremity edema.  Respiratory: Clear to auscultation bilaterally. Not using accessory muscles, speaking in full sentences.  Impression and Recommendations:    Long term (current) use of anticoagulants [Z79.01] Supratherapeutic, patient has been eating less salads and greens than she has been eating the month before. She will go back to her usual amount of greens, and we can recheck in 2 weeks.  No change in dose today.  Pain in right hip There is some high-grade partial-thickness tearing of the gluteus medius, she also has L3-L4 spinal stenosis. She does desire conservative treatment to start out with. Continue physical therapy. At a future date we may discuss epidurals versus PRP into the gluteus medius tear.  Headache Severe headache, supratherapeutic INR. Adding a stat head CT, further evaluation per PCP. ___________________________________________ Gwen Her. Dianah Field, M.D., ABFM., CAQSM. Primary Care and Sports Medicine [facility]  Adjunct Professor of [place] [employer] [facility] [employer]

## 2018-06-19 DIAGNOSIS — D472 Monoclonal gammopathy: Secondary | ICD-10-CM | POA: Diagnosis not present

## 2018-06-20 DIAGNOSIS — F3341 Major depressive disorder, recurrent, in partial remission: Secondary | ICD-10-CM | POA: Diagnosis not present

## 2018-06-20 DIAGNOSIS — E559 Vitamin D deficiency, unspecified: Secondary | ICD-10-CM | POA: Diagnosis not present

## 2018-06-20 DIAGNOSIS — N183 Chronic kidney disease, stage 3 (moderate): Secondary | ICD-10-CM | POA: Diagnosis not present

## 2018-06-20 DIAGNOSIS — M7060 Trochanteric bursitis, unspecified hip: Secondary | ICD-10-CM | POA: Diagnosis not present

## 2018-06-20 DIAGNOSIS — I482 Chronic atrial fibrillation: Secondary | ICD-10-CM | POA: Diagnosis not present

## 2018-06-20 DIAGNOSIS — I129 Hypertensive chronic kidney disease with stage 1 through stage 4 chronic kidney disease, or unspecified chronic kidney disease: Secondary | ICD-10-CM | POA: Diagnosis not present

## 2018-06-21 NOTE — Progress Notes (Signed)
Subjective:   Gloria Mckay is a 77 y.o. female who presents for an Initial Medicare Annual Wellness Visit.  Review of Systems    No ROS.  Medicare Wellness Visit. Additional risk factors are reflected in the social history.  Sleep patterns: getting anout 5-6 hours a night of sleep. Wakes upvery rarely.   Home Safety/Smoke Alarms: Feels safe in home. Smoke alarms in place.  Living environment;Lives in retirement community.    Female:   Pap- aged out      [place]- utd      Dexa scan-  completed     [facility]- utd  Cardiac Risk Factors include: dyslipidemia;advanced age (>58mn, >>81women);hypertension    Objective:    Today's Vitals   07/04/18 1457  BP: (!) 158/69  Pulse: 91  SpO2: 100%  Weight: 114 lb (51.7 kg)  Height: _0  (1.6 m)   Body mass index is 20.19 kg/m.  Advanced Directives 07/04/2018  Does Patient Have a Medical Advance Directive? Yes  [place]  Does patient want to make changes to medical advance directive? No - Patient declined  Copy of HR[place]in Chart? No - copy requested    Current Medications (verified) Outpatient Encounter Medications as of 07/04/2018  Medication Sig  . amLODipine (NORVASC) 10 MG tablet Take 1 tablet (10 mg total) by mouth daily.  . carvedilol (COREG) 25 MG tablet Take 1 tablet (25 mg total) by mouth 2 (two) times daily with a meal.  . Cholecalciferol (VITAMIN D3) 50000 units CAPS Take 1 tablet by mouth daily.  . hydrALAZINE (APRESOLINE) 25 MG tablet Take 1 tablet (25 mg total) by mouth 2 (two) times daily.  .Marland KitchenHYDROcodone-acetaminophen (NORCO) 7.5-325 MG tablet Take 1 tablet by mouth every 6 (six) hours as needed for moderate pain. Use sparingly. #60 for 30(thirty) days  . hydrOXYzine (ATARAX/VISTARIL) 10 MG tablet Take 10-20 mg by mouth 3 (three) times daily as needed for anxiety.  .Marland Kitchenlosartan (COZAAR) 100 MG tablet Take 1 tablet (100 mg total) by mouth daily.   . mometasone (ELOCON) 0.1 % cream Apply 1 application topically daily.  . mupirocin ointment (BACTROBAN) 2 % Place 1 application into the nose 2 (two) times daily.  . Omega-3 Fatty Acids ([employer] FISH OIL PO) Take by mouth.  . topiramate (TOPAMAX) 100 MG tablet Take 100 mg by mouth 2 (two) times daily.  .Marland Kitchenwarfarin (COUMADIN) 5 MG tablet Take 1 tablet (5 mg total) by mouth daily.  .Marland Kitchenwarfarin (COUMADIN) 7.5 MG tablet TAKE 1 AND 1/2 TABLETS DAILY EVERY SUNDAY, TUESDAY, THURSDAY THEN TAKE 1 TABLET EVERY MONDAY, WEDNESDAY, FRIDAY AND SATURDAY   No facility-administered encounter medications on file as of 07/04/2018.     Allergies (verified) Cefuroxime; Diazepam; Diphenhydramine; Latex; Other; Amlodipine; Butorphanol; Diclofenac; Diclofenac sodium; Diphenhydramine hcl; Methylpyrrolidone; Ondansetron; Oxycodone; Penicillins; Quinolones; Sulfa antibiotics; Butorphanol tartrate; Ciprofloxacin; Lisinopril; Ramipril; Ace inhibitors; Codeine; and Pantoprazole sodium   History: Past Medical History:  Diagnosis Date  . Arthritis   . Cancer [place]   . Cataract    bilaterally  . Hypertension    Past Surgical History:  Procedure Laterality Date  . JOINT REPLACEMENT    . STOMACH SURGERY     Family History  Problem Relation Age of Onset  . Hypertension Mother   . Pulmonary embolism Mother   . Heart disease Father    Social History   Socioeconomic History  . Marital status: Widowed  Spouse name: Not on file  . Number of children: 4  . Years of education: 69  . Highest education level: 12th grade  Occupational History  . 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 Financial resource strain: Not hard at all  . Food insecurity:    Worry: Never true    Inability: Never true  . Transportation needs:    Medical: No    Non-medical: No  Tobacco Use  . Smoking status: Never Smoker  . Smokeless tobacco: Never Used  Substance and Sexual Activity  . Alcohol use: No  .  Drug use: No  . Sexual activity: Not Currently  Lifestyle  . Physical activity:    Days per week: 0 days    Minutes per session: 0 min  . Stress: Not at all  Relationships  . Social connections:    Talks on phone: Twice a week    Gets together: Once a week    Attends religious service: Never    Active member of club or organization: No    Attends meetings of clubs or organizations: Never    Relationship status: Widowed  Other Topics Concern  . Not on file  Social History Narrative   Patient is going through a lot of health issues right now ? bone marrow biopsy    Tobacco Counseling Counseling given: Not Answered   Clinical Intake:  Pre-visit preparation completed: Yes  Pain : No/denies pain     Nutritional Risks: None Diabetes: No  How often do you need to have someone help you when you read instructions, pamphlets, or other written materials from your doctor or pharmacy?: 1 - Never What is the last grade level you completed in school?: 12  Interpreter Needed?: No  Information entered by :: Orlie Dakin, LPN   Activities of Daily Living In your present state of health, do you have any difficulty performing the following activities: 07/04/2018  Hearing? Y  Comment has hearing aides  Vision? N  Difficulty concentrating or making decisions? N  Walking or climbing stairs? N  Dressing or bathing? N  Doing errands, shopping? N  Preparing Food and eating ? N  Using the Toilet? N  In the past six months, have you accidently leaked urine? Y  Comment wears a panti liner during the day and a diaper at night.  Do you have problems with loss of bowel control? N  Managing your Medications? N  Managing your Finances? N  Housekeeping or managing your Housekeeping? N  Some recent data might be hidden     Immunizations and Health Maintenance Immunization History  Administered Date(s) Administered  . Influenza Split 05/12/2009, 05/22/2012, 06/02/2016  . Influenza, High  Dose Seasonal PF 05/22/2012, 05/25/2013, 05/21/2014, 06/05/2015, 04/21/2017, 05/19/2018  . Influenza-Unspecified 05/18/2011, 06/02/2016, 04/21/2017  . Pneumococcal Conjugate-13 08/13/2016  . Pneumococcal Polysaccharide-23 02/03/2003, 07/31/2013  . Pneumococcal-Unspecified 02/03/2003, 07/31/2013  . Tdap 11/13/2010   There are no preventive care reminders to display for this patient.  Patient Care Team: Emeterio Reeve, DO as PCP - General (Osteopathic Medicine)  Indicate any recent Medical Services you may have received from other than [facility] providers in the past year (date may be approximate).     Assessment:   This is a routine wellness examination for Tatiyanna.Physical assessment deferred to PCP.   Hearing/Vision screen  Visual Acuity Screening   Right eye Left eye Both eyes  Without correction:     With correction: 20/30 20/30 20/30  Hearing Screening Comments: Patient did not pass the hearing test with hearing aides in  Dietary issues and exercise activities discussed: Current Exercise Habits: The patient does not participate in regular exercise at present, Exercise limited by: orthopedic condition(s);cardiac condition(s) Diet eats healthy vegetables and fruits daily.  Breakfast: applesauce and coffee Lunch: baked potato, steamed veggies. Dinner: chicken and vegetables with salad. Soups      Goals   None    Depression Screen PHQ 2/9 Scores 07/04/2018 02/23/2018 07/15/2017 12/30/2016  PHQ - 2 Score 0 0 0 0  PHQ- 9 Score - 4 6 -    Fall Risk Fall Risk  07/04/2018 03/30/2018 12/30/2016  Falls in the past year? 0 Yes No  Comment - Emmi Telephone Survey: data to providers prior to load -  Number falls in past yr: - 1 -  Comment - Emmi Telephone Survey Actual Response = 1 -  Injury with Fall? - Yes -  Risk for fall due to : Impaired mobility;Impaired balance/gait - -  Follow up Falls prevention discussed - -    Is the patient's home free of loose throw rugs in walkways, pet  beds, electrical cords, etc?   yes      Grab bars in the bathroom? yes      Handrails on the stairs?   yes      Adequate lighting?   yes   Cognitive Function:     6CIT Screen 07/04/2018  What Year? 0 points  What month? 0 points  What time? 0 points  Count back from 20 0 points  Months in reverse 2 points  Repeat phrase 0 points  Total Score 2    Screening Tests Health Maintenance  Topic Date Due  . TETANUS/TDAP  11/12/2020  . INFLUENZA VACCINE  Completed  . DEXA SCAN  Completed  . PNA vac Low Risk Adult  Completed     Plan:  Please schedule your next medicare wellness visit with me in 1 yr.  Ms. Kirsh , Thank you for taking time to come for your Medicare Wellness Visit. I appreciate your ongoing commitment to your health goals. Please review the following plan we discussed and let me know if I can assist you in the future.   Continue doing brain stimulating activities (puzzles, reading, adult coloring books, staying active) to keep memory sharp.  Bring a copy of your living will and/or healthcare power of attorney to your next office visit.   These are the goals we discussed: Goals   None     This is a list of the screening recommended for you and due dates:  Health Maintenance  Topic Date Due  . Tetanus Vaccine  11/12/2020  . Flu Shot  Completed  . DEXA scan (bone density measurement)  Completed  . Pneumonia vaccines  Completed     I have personally reviewed and noted the following in the patient's chart:   . Medical and social history . Use of alcohol, tobacco or illicit drugs  . Current medications and supplements . Functional ability and status . Nutritional status . Physical activity . Advanced directives . List of other physicians . Hospitalizations, surgeries, and ER visits in previous 12 months . Vitals . Screenings to include cognitive, depression, and falls . Referrals and appointments  In addition, I have reviewed and discussed with  patient certain preventive protocols, quality metrics, and best practice recommendations. A written personalized care plan for preventive services as well as general preventive health recommendations were  provided to patient.     Joanne Chars, LPN   [id number]

## 2018-06-30 ENCOUNTER — Ambulatory Visit (INDEPENDENT_AMBULATORY_CARE_PROVIDER_SITE_OTHER): Payer: Medicare Other | Admitting: Osteopathic Medicine

## 2018-06-30 VITALS — BP 160/71 | HR 58

## 2018-06-30 DIAGNOSIS — Z13820 Encounter for screening for osteoporosis: Secondary | ICD-10-CM

## 2018-06-30 DIAGNOSIS — Z7901 Long term (current) use of anticoagulants: Secondary | ICD-10-CM

## 2018-06-30 DIAGNOSIS — D6859 Other primary thrombophilia: Secondary | ICD-10-CM

## 2018-06-30 DIAGNOSIS — I824Y9 Acute embolism and thrombosis of unspecified deep veins of unspecified proximal lower extremity: Secondary | ICD-10-CM

## 2018-06-30 DIAGNOSIS — I480 Paroxysmal atrial fibrillation: Secondary | ICD-10-CM

## 2018-06-30 LAB — POCT INR: INR: 3.4 — AB (ref 2.0–3.0)

## 2018-06-30 NOTE — Progress Notes (Signed)
See anticoag track.

## 2018-07-03 ENCOUNTER — Encounter: Payer: Self-pay | Admitting: Cardiology

## 2018-07-04 ENCOUNTER — Ambulatory Visit (INDEPENDENT_AMBULATORY_CARE_PROVIDER_SITE_OTHER): Payer: Medicare Other | Admitting: *Deleted

## 2018-07-04 VITALS — BP 158/69 | HR 91 | Ht 63.0 in | Wt 114.0 lb

## 2018-07-04 DIAGNOSIS — Z0000 Encounter for general adult medical examination without abnormal findings: Secondary | ICD-10-CM

## 2018-07-04 NOTE — Patient Instructions (Signed)
Please schedule your next medicare wellness visit with me in 1 yr.  Gloria Mckay , Thank you for taking time to come for your Medicare Wellness Visit. I appreciate your ongoing commitment to your health goals. Please review the following plan we discussed and let me know if I can assist you in the future.   Continue doing brain stimulating activities (puzzles, reading, adult coloring books, staying active) to keep memory sharp.  Bring a copy of your living will and/or healthcare power of attorney to your next office visit.

## 2018-07-05 ENCOUNTER — Telehealth: Payer: Self-pay

## 2018-07-05 MED ORDER — FLUCONAZOLE 150 MG PO TABS: 150.0000 mg | ORAL_TABLET | Freq: Once | ORAL | 1 refills | Status: AC

## 2018-07-05 NOTE — Telephone Encounter (Signed)
Pt has been updated. No other inquiries during call.  

## 2018-07-05 NOTE — Telephone Encounter (Signed)
Sent!

## 2018-07-05 NOTE — Telephone Encounter (Signed)
Gloria Mckay complains of vaginal itching. Denies fever, chills, sweats or pelvic pain. She has tried [employer] without relief. She was recently on an antibiotic. She would like a prescription anti-yeast medication. Please advise.

## 2018-07-10 ENCOUNTER — Telehealth: Payer: Self-pay | Admitting: Osteopathic Medicine

## 2018-07-10 DIAGNOSIS — G8928 Other chronic postprocedural pain: Secondary | ICD-10-CM

## 2018-07-10 MED ORDER — HYDROCODONE-ACETAMINOPHEN 7.5-325 MG PO TABS: 1.0000 | ORAL_TABLET | Freq: Four times a day (QID) | ORAL | 0 refills | Status: DC | PRN

## 2018-07-10 NOTE — Telephone Encounter (Signed)
Pt called for refill on her hydrocodone. States she will not be due until Wednesday of this week. Routing.

## 2018-07-12 ENCOUNTER — Ambulatory Visit (INDEPENDENT_AMBULATORY_CARE_PROVIDER_SITE_OTHER): Payer: Medicare Other

## 2018-07-12 DIAGNOSIS — Z7901 Long term (current) use of anticoagulants: Secondary | ICD-10-CM

## 2018-07-12 DIAGNOSIS — M810 Age-related osteoporosis without current pathological fracture: Secondary | ICD-10-CM

## 2018-07-12 DIAGNOSIS — Z13820 Encounter for screening for osteoporosis: Secondary | ICD-10-CM

## 2018-07-14 ENCOUNTER — Ambulatory Visit (INDEPENDENT_AMBULATORY_CARE_PROVIDER_SITE_OTHER): Payer: Medicare Other | Admitting: Sports Medicine

## 2018-07-14 DIAGNOSIS — Z7901 Long term (current) use of anticoagulants: Secondary | ICD-10-CM | POA: Diagnosis not present

## 2018-07-14 DIAGNOSIS — I480 Paroxysmal atrial fibrillation: Secondary | ICD-10-CM

## 2018-07-14 DIAGNOSIS — I824Y9 Acute embolism and thrombosis of unspecified deep veins of unspecified proximal lower extremity: Secondary | ICD-10-CM | POA: Diagnosis not present

## 2018-07-14 DIAGNOSIS — D6859 Other primary thrombophilia: Secondary | ICD-10-CM | POA: Diagnosis not present

## 2018-07-14 LAB — PROTIME-INR
INR: 7.6 — ABNORMAL HIGH
Prothrombin Time: 69 s — ABNORMAL HIGH (ref 9.0–11.5)

## 2018-07-14 LAB — POCT INR: INR: 4.5 — AB (ref 2.0–3.0)

## 2018-07-14 NOTE — Progress Notes (Addendum)
Patient is here for INR check, elevated.  Atrial fibrillation ([place]) [I48.91] Patient historically on 7.5 mg daily, holding Coumadin for now, awaiting lab recheck.  Lab recheck INR is over 7, less than 9, no bleeding so no need for vitamin K. Continue to hold Coumadin and return on Monday or Tuesday for a nurse visit Coumadin check.  Patient was called by Beatris Ship, CMA with results and plan.

## 2018-07-14 NOTE — Assessment & Plan Note (Addendum)
Patient historically on 7.5 mg daily, holding Coumadin for now, awaiting lab recheck.  Lab recheck INR is over 7, less than 9, no bleeding so no need for vitamin K. Continue to hold Coumadin and return on Monday or Tuesday for a nurse visit Coumadin check.  Patient was called by Beatris Ship, CMA with results and plan.

## 2018-07-17 ENCOUNTER — Encounter: Payer: Self-pay | Admitting: Osteopathic Medicine

## 2018-07-17 ENCOUNTER — Ambulatory Visit (INDEPENDENT_AMBULATORY_CARE_PROVIDER_SITE_OTHER): Payer: Medicare Other | Admitting: Osteopathic Medicine

## 2018-07-17 VITALS — BP 116/62 | HR 62 | Temp 98.0°F | Wt 113.5 lb

## 2018-07-17 DIAGNOSIS — M810 Age-related osteoporosis without current pathological fracture: Secondary | ICD-10-CM | POA: Diagnosis not present

## 2018-07-17 DIAGNOSIS — Z86718 Personal history of other venous thrombosis and embolism: Secondary | ICD-10-CM

## 2018-07-17 DIAGNOSIS — I480 Paroxysmal atrial fibrillation: Secondary | ICD-10-CM

## 2018-07-17 LAB — POCT INR: INR: 1.7 — AB (ref 2.0–3.0)

## 2018-07-17 NOTE — Patient Instructions (Signed)
Will work on getting Prolia covered Will recheck INR in a few days  Coumadin mg: Sun 5 Mon 7.5  Tue 5 Wed 7.5 Thu 7.5 Fri 5 - RECHECK INR Sat 7.5

## 2018-07-17 NOTE — Progress Notes (Signed)
HPI: Gloria Mckay is a 77 y.o. female who  has a past medical history of Arthritis, Cancer ([place]), Cataract, and Hypertension.  she presents to [facility] today, 07/17/18,  for chief complaint of:  INR recheck  Osteoporosis  INR last Friday supra therapeutic - no bleeding, skipped 2 days and took a 5 mg yesterday (was on 7.5 mg five days per week and 5 mg two days per week) INR today subtherapeutic at 1.7.   She states she will be following up soon with her cardiology team, she would like to discuss different anticoagulation given the wide fluctuations in INR.  Also recently had bone density test which showed worsening of osteoporosis, into severe range with T score -3.6.  Patient is on calcium/vitamin D, had declined osteoporosis medications in the past.  See below for assessment/plan and discussion of her options    Patient is accompanied by granddaughter who assists with history-taking.    At today's visit... Past medical history, surgical history, and family history reviewed and updated as needed.  Current medication list and allergy/intolerance information reviewed and updated as needed. (See remainder of HPI, ROS, Phys Exam below)     Results for orders placed or performed in visit on 07/17/18 (from the past 72 hour(s))  POCT INR     Status: Abnormal   Collection Time: 07/17/18  4:43 PM  Result Value Ref Range   INR 1.7 (A) 2.0 - 3.0          ASSESSMENT/PLAN: The primary encounter diagnosis was Age-related osteoporosis without current pathological fracture. Diagnoses of Paroxysmal atrial fibrillation (HCC) and History of DVT (deep vein thrombosis) were also pertinent to this visit.   Discussion of risks versus benefits for treatment of osteoporosis versus foregoing treatment.  Given that T score is in the severe osteoporosis range, she may qualify for Evenity, but given cardiac history this may not be best option.  Patient would like  to avoid oral bisphosphonates due to concern for stomach problems, will be open to trying Prolia  Orders Placed This Encounter  Procedures  . POCT INR     Meds ordered this encounter  Medications  . denosumab (PROLIA) 60 MG/ML SOSY injection    Sig: Inject 60 mg into the skin every 6 (six) months. Administer in upper arm, thigh, or abdomen    Dispense:  1 Syringe    Refill:  1    Patient Instructions  Will work on getting Prolia covered Will recheck INR in a few days  Coumadin mg: Sun 5 Mon 7.5  Tue 5 Wed 7.5 Thu 7.5 Fri 5 - RECHECK INR Sat 7.5     Follow-up plan: Return for INR CHECK FRIDAY .                             ############################################ ############################################ ############################################ ############################################    No outpatient medications have been marked as taking for the 07/17/18 encounter (Office Visit) with Emeterio Reeve, DO.    Allergies  Allergen Reactions  . Cefuroxime Anaphylaxis    Difficulty swallowing pills because they were too dry.  Caused tablet dysphagia.  . Diazepam Shortness Of Breath    HYPERVENTILATION     . Diphenhydramine Palpitations    Affected heart rate/er told her not to take again  . Latex Rash and Shortness Of Breath    Airway swelling  . Other Palpitations    Affected heart rate/er told her  not to take again  . Amlodipine Cough and Other (See Comments)    Not sure  . Butorphanol Hives  . Diclofenac Hives  . Diclofenac Sodium Hives  . Diphenhydramine Hcl Palpitations    Affected heart rate/er told her not to take again  . Methylpyrrolidone Hives  . Ondansetron Hives and Other (See Comments)    Sedates/knocks her out   . Oxycodone Hives  . Penicillins Hives and Swelling    Most mycin drugs  . Quinolones Other (See Comments)    Joint pain  . Sulfa Antibiotics Hives and Swelling  . Butorphanol  Tartrate Hives  . Ciprofloxacin     Patient prefers not to take Fluoroquinolones  . Lisinopril Cough    Caused pt to cough  . Ramipril Cough    Pt c/o cough  . Ace Inhibitors Other (See Comments) and Cough    Lisinopril and ramipril   . Codeine Nausea Only  . Pantoprazole Sodium Diarrhea       Review of Systems:  Constitutional: No recent illness  HEENT: No  headache, no vision change  Cardiac: No  chest pain, No  pressure, No palpitations  Respiratory:  No  shortness of breath. No  Cough  Gastrointestinal: No  abdominal pain, no change on bowel habits  Musculoskeletal: No new myalgia/arthralgia  Neurologic: No  weakness, No  Dizziness   Exam:  BP 116/62 (BP Location: Left Arm, Patient Position: Sitting, Cuff Size: Normal)   Pulse 62   Temp 98 F (36.7 C) (Oral)   Wt 113 lb 8 oz (51.5 kg)   BMI 20.11 kg/m   Constitutional: VS see above. General Appearance: alert, well-developed, well-nourished, NAD  Eyes: Normal lids and conjunctive, non-icteric sclera  Ears, Nose, Mouth, Throat: MMM, Normal external inspection ears/nares/mouth/lips/gums.  Neck: No masses, trachea midline.   Respiratory: Normal respiratory effort.  Musculoskeletal: Gait normal. Symmetric and independent movement of all extremities  Abdominal: non-tender, non-distended, no appreciable organomegaly, neg Murphy's, BS [id number]  Neurological: Normal balance/coordination. No tremor.  Skin: warm, dry, intact.   Psychiatric: Normal judgment/insight. Normal mood and affect. Oriented x3.       Visit summary with medication list and pertinent instructions was printed for patient to review, patient was advised to alert [place] if any updates are needed. All questions at time of visit were answered - patient instructed to contact office with any additional concerns. ER/RTC precautions were reviewed with the patient and understanding verbalized.   Note: Total time spent 25 minutes, greater than 50% of  the visit was spent face-to-face counseling and coordinating care for the following: The primary encounter diagnosis was Age-related osteoporosis without current pathological fracture. Diagnoses of Paroxysmal atrial fibrillation (HCC) and History of DVT (deep vein thrombosis) were also pertinent to this visit.Marland Kitchen  Please note: voice recognition software was used to produce this document, and typos may escape review. Please contact Dr. Sheppard Coil for any needed clarifications.    Follow up plan: Return for INR CHECK FRIDAY .

## 2018-07-18 ENCOUNTER — Encounter: Payer: Self-pay | Admitting: Osteopathic Medicine

## 2018-07-18 MED ORDER — DENOSUMAB 60 MG/ML ~~LOC~~ SOSY: 60.0000 mg | PREFILLED_SYRINGE | SUBCUTANEOUS | 1 refills | Status: DC

## 2018-07-19 ENCOUNTER — Ambulatory Visit: Payer: Medicare Other | Admitting: Osteopathic Medicine

## 2018-07-20 ENCOUNTER — Other Ambulatory Visit (INDEPENDENT_AMBULATORY_CARE_PROVIDER_SITE_OTHER): Payer: Medicare Other | Admitting: Osteopathic Medicine

## 2018-07-20 ENCOUNTER — Ambulatory Visit: Payer: Medicare Other | Admitting: Osteopathic Medicine

## 2018-07-20 DIAGNOSIS — M810 Age-related osteoporosis without current pathological fracture: Secondary | ICD-10-CM

## 2018-07-20 NOTE — Progress Notes (Signed)
Lab orders placed so Pt can get Prolia injection.

## 2018-07-21 ENCOUNTER — Ambulatory Visit (INDEPENDENT_AMBULATORY_CARE_PROVIDER_SITE_OTHER): Payer: Medicare Other | Admitting: Sports Medicine

## 2018-07-21 DIAGNOSIS — D6859 Other primary thrombophilia: Secondary | ICD-10-CM | POA: Diagnosis not present

## 2018-07-21 DIAGNOSIS — I480 Paroxysmal atrial fibrillation: Secondary | ICD-10-CM

## 2018-07-21 DIAGNOSIS — Z7901 Long term (current) use of anticoagulants: Secondary | ICD-10-CM | POA: Diagnosis not present

## 2018-07-21 DIAGNOSIS — I824Y9 Acute embolism and thrombosis of unspecified deep veins of unspecified proximal lower extremity: Secondary | ICD-10-CM

## 2018-07-21 DIAGNOSIS — M810 Age-related osteoporosis without current pathological fracture: Secondary | ICD-10-CM | POA: Diagnosis not present

## 2018-07-21 LAB — POCT INR: INR: 2.2 (ref 2.0–3.0)

## 2018-07-21 NOTE — Progress Notes (Signed)
Pt in today for PT/INR . Todays results were 2.2. Pt is concerned about taking prolia injections. She states she is allergic to everything. Pt states before she start she would like to have phone call from provider to discuss what the plan would be if she had an reaction to it.Advised pt I would forward note to provider .

## 2018-07-22 ENCOUNTER — Other Ambulatory Visit: Payer: Self-pay | Admitting: Osteopathic Medicine

## 2018-07-22 ENCOUNTER — Encounter: Payer: Self-pay | Admitting: Osteopathic Medicine

## 2018-07-22 LAB — COMPREHENSIVE METABOLIC PANEL
AG Ratio: 1.8 (calc) (ref 1.0–2.5)
ALT: 13 U/L (ref 6–29)
AST: 18 U/L (ref 10–35)
Albumin: 3.8 g/dL (ref 3.6–5.1)
Alkaline phosphatase (APISO): 70 U/L (ref 33–130)
BUN / CREAT RATIO: 22 (calc) (ref 6–22)
BUN: 38 mg/dL — AB (ref 7–25)
CO2: 12 mmol/L — ABNORMAL LOW (ref 20–32)
CREATININE: 1.73 mg/dL — AB (ref 0.60–0.93)
Calcium: 8.5 mg/dL — ABNORMAL LOW (ref 8.6–10.4)
Chloride: 121 mmol/L — ABNORMAL HIGH (ref 98–110)
GLUCOSE: 81 mg/dL (ref 65–99)
Globulin: 2.1 g/dL (calc) (ref 1.9–3.7)
Potassium: 5 mmol/L (ref 3.5–5.3)
SODIUM: 145 mmol/L (ref 135–146)
Total Bilirubin: 0.2 mg/dL (ref 0.2–1.2)
Total Protein: 5.9 g/dL — ABNORMAL LOW (ref 6.1–8.1)

## 2018-07-22 MED ORDER — CALCIUM CARBONATE-VITAMIN D 600-400 MG-UNIT PO CHEW: 2.0000 | CHEWABLE_TABLET | Freq: Every day | ORAL | 12 refills | Status: DC

## 2018-07-22 NOTE — Addendum Note (Signed)
Addended by: Maryla Morrow on: 07/22/2018 12:18 PM   Modules accepted: Orders, Level of Service

## 2018-07-24 ENCOUNTER — Encounter: Payer: Self-pay | Admitting: Cardiology

## 2018-07-24 ENCOUNTER — Ambulatory Visit (INDEPENDENT_AMBULATORY_CARE_PROVIDER_SITE_OTHER): Payer: Medicare Other | Admitting: Cardiology

## 2018-07-24 VITALS — BP 132/70 | HR 63 | Ht 63.0 in | Wt 110.0 lb

## 2018-07-24 DIAGNOSIS — I471 Supraventricular tachycardia: Secondary | ICD-10-CM | POA: Diagnosis not present

## 2018-07-24 DIAGNOSIS — I480 Paroxysmal atrial fibrillation: Secondary | ICD-10-CM

## 2018-07-24 DIAGNOSIS — I10 Essential (primary) hypertension: Secondary | ICD-10-CM | POA: Diagnosis not present

## 2018-07-24 MED ORDER — APIXABAN 2.5 MG PO TABS: 2.5000 mg | ORAL_TABLET | Freq: Two times a day (BID) | ORAL | 3 refills | Status: DC

## 2018-07-24 MED ORDER — APIXABAN 2.5 MG PO TABS: 2.5000 mg | ORAL_TABLET | Freq: Two times a day (BID) | ORAL | 0 refills | Status: DC

## 2018-07-24 NOTE — Patient Instructions (Signed)
Medication Instructions:  Your physician has recommended you make the following change in your medication: 1. STOP Coumadin - do not take this medication today 2. START Eliquis 2.5 mg twice daily  - you will start this medication 07/25/18 p.m. dose  * If you need a refill on your cardiac medications before your next appointment, please call your pharmacy.   Labwork: None ordered  Testing/Procedures: None ordered  Follow-Up: Your physician recommends that you schedule a follow-up appointment in: 3 months with Dr. Curt Bears.   *Please note that any paperwork needing to be filled out by the provider will need to be addressed at the front desk prior to seeing the provider. Please note that any FMLA, disability or other documents regarding health condition is subject to a $25.00 charge that must be received prior to completion of paperwork in the form of a money order or check.  Thank you for choosing [facility]!!   Trinidad Curet, RN [phone]  Any Other Special Instructions Will Be Listed Below (If Applicable).  Apixaban oral tablets What is this medicine? APIXABAN (a PIX a ban) is an anticoagulant (blood thinner). It is used to lower the chance of stroke in people with a medical condition called atrial fibrillation. It is also used to treat or prevent blood clots in the lungs or in the veins. This medicine may be used for other purposes; ask your health care provider or pharmacist if you have questions. COMMON BRAND NAME(S): Eliquis What should I tell my health care provider before I take this medicine? They need to know if you have any of these conditions: -bleeding disorders -bleeding in the brain -blood in your stools (black or tarry stools) or if you have blood in your vomit -history of stomach bleeding -kidney disease -liver disease -mechanical heart valve -an unusual or allergic reaction to apixaban, other medicines, foods, dyes, or preservatives -pregnant or trying  to get pregnant -breast-feeding How should I use this medicine? Take this medicine by mouth with a glass of water. Follow the directions on the prescription label. You can take it with or without food. If it upsets your stomach, take it with food. Take your medicine at regular intervals. Do not take it more often than directed. Do not stop taking except on your doctor's advice. Stopping this medicine may increase your risk of a blot clot. Be sure to refill your prescription before you run out of medicine. Talk to your pediatrician regarding the use of this medicine in children. Special care may be needed. Overdosage: If you think you have taken too much of this medicine contact a poison control center or emergency room at once. NOTE: This medicine is only for you. Do not share this medicine with others. What if I miss a dose? If you miss a dose, take it as soon as you can. If it is almost time for your next dose, take only that dose. Do not take double or extra doses. What may interact with this medicine? This medicine may interact with the following: -aspirin and aspirin-like medicines -certain medicines for fungal infections like ketoconazole and itraconazole -certain medicines for seizures like carbamazepine and phenytoin -certain medicines that treat or prevent blood clots like warfarin, enoxaparin, and dalteparin -clarithromycin -NSAIDs, medicines for pain and inflammation, like ibuprofen or naproxen -rifampin -ritonavir -St. John's wort This list may not describe all possible interactions. Give your health care provider a list of all the medicines, herbs, non-prescription drugs, or dietary supplements you use. Also tell  them if you smoke, drink alcohol, or use illegal drugs. Some items may interact with your medicine. What should I watch for while using this medicine? Visit your doctor or health care professional for regular checks on your progress. Notify your doctor or health care  professional and seek emergency treatment if you develop breathing problems; changes in vision; chest pain; severe, sudden headache; pain, swelling, warmth in the leg; trouble speaking; sudden numbness or weakness of the face, arm or leg. These can be signs that your condition has gotten worse. If you are going to have surgery or other procedure, tell your doctor that you are taking this medicine. What side effects may I notice from receiving this medicine? Side effects that you should report to your doctor or health care professional as soon as possible: -allergic reactions like skin rash, itching or hives, swelling of the face, lips, or tongue -signs and symptoms of bleeding such as bloody or black, tarry stools; red or dark-brown urine; spitting up blood or brown material that looks like coffee grounds; red spots on the skin; unusual bruising or bleeding from the eye, gums, or nose This list may not describe all possible side effects. Call your doctor for medical advice about side effects. You may report side effects to FDA at 1-800-FDA-1088. Where should I keep my medicine? Keep out of the reach of children. Store at room temperature between 20 and 25 degrees C (68 and 77 degrees F). Throw away any unused medicine after the expiration date. NOTE: This sheet is a summary. It may not cover all possible information. If you have questions about this medicine, talk to your doctor, pharmacist, or health care provider.  2018 Elsevier/Gold Standard (2016-03-08 11:54:23)

## 2018-07-24 NOTE — Progress Notes (Signed)
Electrophysiology Office Note   Date:  07/24/2018   ID:  Gloria Mckay, DOB 1940/12/02, MRN [id number]  PCP:  Emeterio Reeve, DO  Cardiologist:  Agustin Cree Primary Electrophysiologist:  Aundreya Souffrant Meredith Leeds, MD    No chief complaint on file.    History of Present Illness: Gloria Mckay is a 77 y.o. female who is being seen today for the evaluation of atrial fibrillation, AT at the request of [facility]. Presenting today for electrophysiology evaluation.  Is a history of paroxysmal atrial fibrillation and hypertension.  She has been on Coumadin for years due to DVTs and PE as well.  She wore a cardiac monitor that showed evidence of atrial tachycardia but no atrial fibrillation.  Today, she denies symptoms of palpitations, chest pain, shortness of breath, orthopnea, PND, lower extremity edema, claudication, dizziness, presyncope, syncope, bleeding, or neurologic sequela. The patient is tolerating medications without difficulties.    Past Medical History:  Diagnosis Date  . Arthritis   . Cancer ([place])   . Cataract    bilaterally  . Hypertension    Past Surgical History:  Procedure Laterality Date  . JOINT REPLACEMENT    . STOMACH SURGERY       Current Outpatient Medications  Medication Sig Dispense Refill  . amLODipine (NORVASC) 10 MG tablet Take 1 tablet (10 mg total) by mouth daily. 90 tablet 3  . Calcium Carbonate-Vitamin D 600-400 MG-UNIT chew tablet Chew 2 tablets by mouth daily. 60 tablet 12  . carvedilol (COREG) 25 MG tablet Take 1 tablet (25 mg total) by mouth 2 (two) times daily with a meal. 180 tablet 0  . Cholecalciferol (VITAMIN D3) 50000 units CAPS Take 1 tablet by mouth daily.  1  . denosumab (PROLIA) 60 MG/ML SOSY injection Inject 60 mg into the skin every 6 (six) months. Administer in upper arm, thigh, or abdomen 1 Syringe 1  . hydrALAZINE (APRESOLINE) 25 MG tablet Take 1 tablet (25 mg total) by mouth 2 (two) times daily. 180 tablet 3  .  HYDROcodone-acetaminophen (NORCO) 7.5-325 MG tablet Take 1 tablet by mouth every 6 (six) hours as needed for moderate pain. Use sparingly. #60 for 30(thirty) days 60 tablet 0  . hydrOXYzine (ATARAX/VISTARIL) 10 MG tablet Take 10-20 mg by mouth 3 (three) times daily as needed for anxiety.    Marland Kitchen losartan (COZAAR) 100 MG tablet Take 1 tablet (100 mg total) by mouth daily. 90 tablet 0  . mometasone (ELOCON) 0.1 % cream Apply 1 application topically daily.    . mupirocin ointment (BACTROBAN) 2 % Place 1 application into the nose 2 (two) times daily.    . Omega-3 Fatty Acids ([facility] FISH OIL PO) Take by mouth.    . topiramate (TOPAMAX) 100 MG tablet Take 100 mg by mouth 2 (two) times daily.    Marland Kitchen warfarin (COUMADIN) 5 MG tablet Take 1 tablet (5 mg total) by mouth daily. 30 tablet 1  . warfarin (COUMADIN) 7.5 MG tablet TAKE 1 AND 1/2 TABLETS DAILY EVERY SUNDAY, TUESDAY, THURSDAY THEN TAKE 1 TABLET EVERY MONDAY, WEDNESDAY, FRIDAY AND SATURDAY 90 tablet 0  . warfarin (COUMADIN) 5 MG tablet TAKE 1 TABLET BY MOUTH EVERY DAY 90 tablet 0   No current facility-administered medications for this visit.     Allergies:   Cefuroxime; Diazepam; Diphenhydramine; Latex; Other; Amlodipine; Butorphanol; Diclofenac; Diclofenac sodium; Diphenhydramine hcl; Methylpyrrolidone; Ondansetron; Oxycodone; Penicillins; Quinolones; Sulfa antibiotics; Butorphanol tartrate; Lisinopril; Ramipril; Ace inhibitors; Ciprofloxacin; Codeine; and Pantoprazole sodium   Social History:  The patient  reports that she has never smoked. She has never used smokeless tobacco. She reports that she does not drink alcohol or use drugs.   Family History:  The patient's family history includes Heart disease in her father; Hypertension in her mother; Pulmonary embolism in her mother.    ROS:  Please see the history of present illness.   Otherwise, review of systems is positive for none.   All other systems are reviewed and negative.    PHYSICAL  EXAM: VS:  BP 132/70   Pulse 63   Ht 5\' 3"  (1.6 m)   Wt 110 lb (49.9 kg)   BMI 19.49 kg/m  , BMI Body mass index is 19.49 kg/m. GEN: Well nourished, well developed, in no acute distress  HEENT: normal  Neck: no JVD, carotid bruits, or masses Cardiac: RRR; no murmurs, rubs, or gallops,no edema  Respiratory:  clear to auscultation bilaterally, normal work of breathing GI: soft, nontender, nondistended, + BS MS: no deformity or atrophy  Skin: warm and dry Neuro:  Strength and sensation are intact Psych: euthymic mood, full affect  EKG:  EKG is ordered today. Personal review of the ekg ordered shows sinus rhythm, rate 63, septal Q waves  Recent Labs: 02/28/2018: TSH 1.91 07/21/2018: ALT 13; BUN 38; Creat 1.73; Potassium 5.0; Sodium 145    Lipid Panel  No results found for: CHOL, TRIG, HDL, CHOLHDL, VLDL, LDLCALC, LDLDIRECT   Wt Readings from Last 3 Encounters:  07/24/18 110 lb (49.9 kg)  07/21/18 110 lb (49.9 kg)  07/17/18 113 lb 8 oz (51.5 kg)      Other studies Reviewed: Additional studies/ records that were reviewed today include: TTE 04/03/18  Review of the above records today demonstrates:  - Left ventricle: The cavity size was normal. Systolic function was   normal. The estimated ejection fraction was in the range of 60%   to 65%. Wall motion was normal; there were no regional wall   motion abnormalities. - Mitral valve: There was mild regurgitation. - Left atrium:  The atrium was normal in size.  Holter 04/27/17 - personally reviewed Baseline rhythm: Sinus rhythm  Minimum heart rate: 47 BPM.  Average heart rate: 63 BPM.  Maximal heart rate 105 BPM.  Atrial arrhythmia: Total of 1069 premature supraventricular beats with 8 pairs and 9 runs of long RP tachycardia longest 9 beats fastest 116.  It appears to be atrial tachycardia.  Ventricular arrhythmia: Total of 295 premature ventricular beats  Conduction abnormality: Baseline right bundle branch block  morphology of QRS complex   ASSESSMENT AND PLAN:  1.  Paroxysmal atrial fibrillation: Currently in sinus rhythm.  She has intermittent palpitations which were found to be in atrial tachycardia and PACs on her monitor.  She is anticoagulated with Coumadin.  We Georgia Delsignore switch her to Eliquis today.  This patients CHA2DS2-VASc Score and unadjusted Ischemic Stroke Rate (% per year) is equal to 4.8 % stroke rate/year from a score of 4  Above score calculated as 1 point each if present [CHF, HTN, DM, Vascular=MI/PAD/Aortic Plaque, Age if 65-74, or Female] Above score calculated as 2 points each if present [Age > 75, or Stroke/TIA/TE]  2.  Atrial tachycardia: Found on her monitor.  She does have symptoms of palpitations, though she would like to avoid therapy at this point.  No changes.  3.  Hypertension: Currently well controlled.  No changes.    Current medicines are reviewed at length with the patient today.   The patient does not  have concerns regarding her medicines.  The following changes were made today: Stop Coumadin, start Eliquis  Labs/ tests ordered today include:  Orders Placed This Encounter  Procedures  . EKG 12-Lead   Case discussed with primary cardiology  Disposition:   FU with Kyoko Elsea 3 months  Signed, Orlena Garmon Meredith Leeds, MD  07/24/2018 3:19 PM     [place] [address] [place] [address] [phone] (office) [phone] (fax)

## 2018-07-24 NOTE — Addendum Note (Signed)
Addended by: Stanton Kidney on: 07/24/2018 03:52 PM   Modules accepted: Orders

## 2018-07-25 ENCOUNTER — Other Ambulatory Visit: Payer: Self-pay | Admitting: Osteopathic Medicine

## 2018-07-25 DIAGNOSIS — N183 Chronic kidney disease, stage 3 unspecified: Secondary | ICD-10-CM

## 2018-07-25 DIAGNOSIS — I129 Hypertensive chronic kidney disease with stage 1 through stage 4 chronic kidney disease, or unspecified chronic kidney disease: Secondary | ICD-10-CM

## 2018-07-25 NOTE — Progress Notes (Signed)
Lab order placed for calcium recheck. Pt aware to go to lab after taking calcium supplement for 30 days.

## 2018-07-31 ENCOUNTER — Ambulatory Visit: Payer: Medicare Other | Admitting: Sports Medicine

## 2018-07-31 ENCOUNTER — Telehealth: Payer: Self-pay

## 2018-07-31 NOTE — Telephone Encounter (Signed)
Please call and schedule a follow up with Dr Dianah Field.

## 2018-08-01 ENCOUNTER — Telehealth: Payer: Self-pay | Admitting: *Deleted

## 2018-08-01 ENCOUNTER — Ambulatory Visit (INDEPENDENT_AMBULATORY_CARE_PROVIDER_SITE_OTHER): Payer: Medicare Other | Admitting: Family Medicine

## 2018-08-01 ENCOUNTER — Encounter: Payer: Self-pay | Admitting: Family Medicine

## 2018-08-01 ENCOUNTER — Ambulatory Visit (INDEPENDENT_AMBULATORY_CARE_PROVIDER_SITE_OTHER): Payer: Medicare Other

## 2018-08-01 VITALS — BP 152/80 | HR 61 | Wt 113.0 lb

## 2018-08-01 DIAGNOSIS — S299XXA Unspecified injury of thorax, initial encounter: Secondary | ICD-10-CM | POA: Diagnosis not present

## 2018-08-01 DIAGNOSIS — R0781 Pleurodynia: Secondary | ICD-10-CM | POA: Diagnosis not present

## 2018-08-01 DIAGNOSIS — M25551 Pain in right hip: Secondary | ICD-10-CM

## 2018-08-01 DIAGNOSIS — W19XXXA Unspecified fall, initial encounter: Secondary | ICD-10-CM | POA: Diagnosis not present

## 2018-08-01 DIAGNOSIS — S79911A Unspecified injury of right hip, initial encounter: Secondary | ICD-10-CM | POA: Diagnosis not present

## 2018-08-01 DIAGNOSIS — M79604 Pain in right leg: Secondary | ICD-10-CM | POA: Diagnosis not present

## 2018-08-01 DIAGNOSIS — M79605 Pain in left leg: Secondary | ICD-10-CM

## 2018-08-01 DIAGNOSIS — S8992XA Unspecified injury of left lower leg, initial encounter: Secondary | ICD-10-CM | POA: Diagnosis not present

## 2018-08-01 DIAGNOSIS — S79922A Unspecified injury of left thigh, initial encounter: Secondary | ICD-10-CM | POA: Diagnosis not present

## 2018-08-01 NOTE — Telephone Encounter (Signed)
Informed me that they were able to get the 30 day free supply.

## 2018-08-01 NOTE — Telephone Encounter (Addendum)
**Note De-Identified  Obfuscation** I called the pts pharmacy, [facility], and was advised that Warfarin is the preferred drug on the pts Part D plan and that all other anticoagulants (Xarelto, Pradaxa, or Savaysa) are going to cost the pt about the same as Eliquis which is 116.00 for a 30 day supply. Also, I was advised that a PA is not required for this medication as the pts insurance is paying their part.  I will attempt an Eliquis tier exception in hopes of bringing the cost for the pt down.

## 2018-08-01 NOTE — Patient Instructions (Addendum)
Thank you for coming in today.  Make sure to take deep breaths every 30 mins while awake.  Use the plastic incentive spirometer.   Use medicine for pain as needed, Deep blue is ok to use.   Use your walker at home.   Recheck with Dr T in about 1 week.  Return sooner if needed.

## 2018-08-01 NOTE — Telephone Encounter (Signed)
Received email from pt's dtr about looking into cost assistance --  Good morning! I'm emailing on behalf of Gloria Mckay. The Eliquis that was prescribed to her yesterday is going to cost her $116 a month after what her insurance pays. This is not something that she can afford on a fixed and very limited income. She does have the card to get a free 30 day supply as well as the one month supply of samples, but we need to figure out what will happen beyond the next couple of months.  Thanks so much!    Will forward this to our prior auth dept to address.

## 2018-08-01 NOTE — Telephone Encounter (Addendum)
I attempted a tier exception through covermymeds but received the following message:  If you are requesting Eliquis, Xarelto, Myrbetriq, or Ventolin HFA, there is not a lower available tier to request. Please call [employer] at [phone].    I called [employer] and s/w [name] concerning a tier exception on the pts Eliquis. Per [name] we cannot do a tier exception on Eliquis for 2020 until 08/30/18.  Will forward this message back to Dr Macky Lower nurse as [name].

## 2018-08-01 NOTE — Telephone Encounter (Signed)
[email or web address]

## 2018-08-01 NOTE — Progress Notes (Signed)
Gloria Mckay is a 77 y.o. female who presents to [facility] today for fall and injury.  Gloria Mckay was in her normal state of health today when she tripped over some furniture landing on her right side.  She landed on her right hip and right chest wall.  She notes right lateral hip pain and right rib pain.  Additionally she notes pain in the left thigh knee and tibia.  She has several pertinent orthopedic and medical problems.  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  This is been slowly treated with various interventions including radiation therapy and subsequently total knee replacement in the 80s and revision in the mid 2000's.  She notes her pain is worsened since the fall in her left thigh.  She also thinks that she bruised her right side of her ribs.  She notes pain with deep inspiration but denies significant shortness of breath.  She notes that she did not hit her head.  She denies any significant bleeding or excessive bruising.   .  Additionally patient does have other chronic medical problems that contribution are complicated her visit today.  She has MGUS managed by Dr [facility]ck[facility].  She notes that she has a upcoming planned bone marrow aspiration for mid December.   ROS as above:  Exam:  BP (!) 152/80   Pulse 61   Wt 113 lb (51.3 kg)   BMI 20.02 kg/m  Wt Readings from Last 5 Encounters:  08/01/18 113 lb (51.3 kg)  07/24/18 110 lb (49.9 kg)  07/21/18 110 lb (49.9 kg)  07/17/18 113 lb 8 oz (51.5 kg)  07/04/18 114 lb (51.7 kg)    Gen: Well NAD HEENT: EOMI,  MMM Lungs: Normal work of breathing. CTABL Chest wall tender to palpation right lateral chest wall with no palpable crepitations.  Symmetrical chest expansion.  Lung sounds present throughout. Heart: RRR no MRG Abd: NABS, Soft. Nondistended, Nontender Exts: Brisk capillary  refill, warm and well perfused.  MSK: Right hip: Normal-appearing normal motion pain with resisted abduction.  Antalgic gait present. Left leg: Left hip normal motion. Left knee well-appearing mature scar across the midline of her knee.  Normal knee motion.  Not particularly tender across thigh and lower leg. Antalgic gait present.  Lab and Radiology Results X-ray images personally independently reviewed.  Chest and right ribs: No acute fractures pneumothorax or pleural effusion present.  Right hip and AP pelvis: No acute fracture present.  No obvious lytic lesions present.  Left femur and left tib-fib: Well-appearing prosthesis at knee with intramedullary post distal femur proximal tibia.  Osteopenia pattern present with no acute fractures visible.  Intact hardware.  Await formal radiology review regarding x-ray results as above.   Assessment and Plan: 77 y.o. female with fall with contusion of right hip ribs and left leg.  No obvious fracture per my view of x-rays today.  X-ray review is pending.  Plan for pain medication as tolerated with over-the-counter topical creams as as that has worked well for her in the past.  Plan to follow back up with myself or my partner in 1 week.  Additionally use an incentive spirometer to avoid atelectasis.  Lastly she has an upcoming bone marrow aspiration in about 2 weeks.  I sent a copy of today's note to her oncologist as well as a letter.  I am not sure if the increased pain  due to the contusion will affect bone marrow aspiration plan.  CC: [facility], [facility]  Gloria Mckay, Gloria Mckay Gloria [facility]  [phone]  [phone] (Fax)  Orders Placed This Encounter  Procedures  . DG HIP UNILAT WITH PELVIS 2-3 VIEWS RIGHT    Standing Status:   Future    Number of Occurrences:   1    Standing Expiration Date:   10/03/2019    Order Specific Question:   Reason for Exam (SYMPTOM  OR DIAGNOSIS REQUIRED)     Answer:   fell right hip pain    Order Specific Question:   Preferred imaging location?    Answer:   [facility]    Order Specific Question:   Radiology Contrast Protocol - do NOT remove file path    Answer:   \\[email or web address]\Radiant\DXFluoroContrastProtocols.pdf  . DG FEMUR MIN 2 VIEWS LEFT    Standing Status:   Future    Number of Occurrences:   1    Standing Expiration Date:   10/03/2019    Order Specific Question:   Reason for Exam (SYMPTOM  OR DIAGNOSIS REQUIRED)    Answer:   fall left thigh and knee pain. S/p left TKR    Order Specific Question:   Preferred imaging location?    Answer:   [facility]    Order Specific Question:   Radiology Contrast Protocol - do NOT remove file path    Answer:   \\[email or web address]\Radiant\DXFluoroContrastProtocols.pdf  . DG Tibia/Fibula Left    Standing Status:   Future    Number of Occurrences:   1    Standing Expiration Date:   10/03/2019    Order Specific Question:   Reason for Exam (SYMPTOM  OR DIAGNOSIS REQUIRED)    Answer:   pain left lower leg following fall. Hx TKR    Order Specific Question:   Preferred imaging location?    Answer:   [facility]    Order Specific Question:   Radiology Contrast Protocol - do NOT remove file path    Answer:   \\[email or web address]\Radiant\DXFluoroContrastProtocols.pdf  . DG Ribs Unilateral W/Chest Right    Order Specific Question:   Reason for exam:    Answer:   Injury, rib pain. assess for fracture, pneumothorax.    Order Specific Question:   Preferred imaging location?    Answer:   [facility]   No orders of the defined types were placed in this encounter.    Historical information moved to improve visibility of documentation.  Past Medical History:  Diagnosis Date  . Arthritis   . Cancer ([place])   . Cataract    bilaterally  . Hypertension    Past Surgical History:  Procedure Laterality Date  . JOINT REPLACEMENT    . STOMACH SURGERY     Social  History   Tobacco Use  . Smoking status: Never Smoker  . Smokeless tobacco: Never Used  Substance Use Topics  . Alcohol use: No   family history includes Heart disease in her father; Hypertension in her mother; Pulmonary embolism in her mother.  Medications: Current Outpatient Medications  Medication Sig Dispense Refill  . amLODipine (NORVASC) 10 MG tablet Take 1 tablet (10 mg total) by mouth daily. 90 tablet 3  . apixaban (ELIQUIS) 2.5 MG TABS tablet Take 1 tablet (2.5 mg total) by mouth 2 (two) times daily. 60 tablet 0  . apixaban (ELIQUIS) 2.5 MG TABS tablet Take 1 tablet (2.5 mg total) by mouth 2 (two) times  daily. 60 tablet 3  . Calcium Carbonate-Vitamin D 600-400 MG-UNIT chew tablet Chew 2 tablets by mouth daily. 60 tablet 12  . carvedilol (COREG) 25 MG tablet Take 1 tablet (25 mg total) by mouth 2 (two) times daily with a meal. 180 tablet 0  . Cholecalciferol (VITAMIN D3) 50000 units CAPS Take 1 tablet by mouth daily.  1  . denosumab (PROLIA) 60 MG/ML SOSY injection Inject 60 mg into the skin every 6 (six) months. Administer in upper arm, thigh, or abdomen 1 Syringe 1  . hydrALAZINE (APRESOLINE) 25 MG tablet Take 1 tablet (25 mg total) by mouth 2 (two) times daily. 180 tablet 3  . HYDROcodone-acetaminophen (NORCO) 7.5-325 MG tablet Take 1 tablet by mouth every 6 (six) hours as needed for moderate pain. Use sparingly. #60 for 30(thirty) days 60 tablet 0  . hydrOXYzine (ATARAX/VISTARIL) 10 MG tablet Take 10-20 mg by mouth 3 (three) times daily as needed for anxiety.    Marland Kitchen losartan (COZAAR) 100 MG tablet Take 1 tablet (100 mg total) by mouth daily. 90 tablet 0  . mometasone (ELOCON) 0.1 % cream Apply 1 application topically daily.    . mupirocin ointment (BACTROBAN) 2 % Place 1 application into the nose 2 (two) times daily.    . Omega-3 Fatty Acids ([facility] FISH OIL PO) Take by mouth.    . topiramate (TOPAMAX) 100 MG tablet Take 100 mg by mouth 2 (two) times daily.     No current  facility-administered medications for this visit.    Allergies  Allergen Reactions  . Cefuroxime Anaphylaxis    Difficulty swallowing pills because they were too dry.  Caused tablet dysphagia.  . Diazepam Shortness Of Breath    HYPERVENTILATION     . Diphenhydramine Palpitations    Affected heart rate/er told her not to take again  . Latex Rash and Shortness Of Breath    Airway swelling  . Other Palpitations    Affected heart rate/er told her not to take again  . Amlodipine Cough and Other (See Comments)    Not sure  . Butorphanol Hives  . Diclofenac Hives  . Diclofenac Sodium Hives  . Diphenhydramine Hcl Palpitations    Affected heart rate/er told her not to take again  . Methylpyrrolidone Hives  . Ondansetron Hives and Other (See Comments)    Sedates/knocks her out    . Oxycodone Hives  . Penicillins Hives and Swelling    Most mycin drugs  . Quinolones Other (See Comments)    Joint pain  . Sulfa Antibiotics Hives and Swelling  . Butorphanol Tartrate Hives  . Lisinopril Cough    Caused pt to cough  . Ramipril Cough    Pt c/o cough  . Ace Inhibitors Other (See Comments) and Cough    Lisinopril and ramipril   . Ciprofloxacin Rash    Patient prefers not to take Fluoroquinolones   . Codeine Nausea Only  . Pantoprazole Sodium Diarrhea     Discussed warning signs or symptoms. Please see discharge instructions. Patient expresses understanding.

## 2018-08-02 NOTE — Telephone Encounter (Signed)
Pt has scheduled her appointment with Dr T. Thanks.

## 2018-08-08 ENCOUNTER — Other Ambulatory Visit: Payer: Self-pay

## 2018-08-08 DIAGNOSIS — G8928 Other chronic postprocedural pain: Secondary | ICD-10-CM

## 2018-08-08 MED ORDER — HYDROCODONE-ACETAMINOPHEN 7.5-325 MG PO TABS: 1.0000 | ORAL_TABLET | Freq: Four times a day (QID) | ORAL | 0 refills | Status: DC | PRN

## 2018-08-08 NOTE — Telephone Encounter (Signed)
Gloria Mckay requests a refill on Hydrocodone.

## 2018-08-11 ENCOUNTER — Ambulatory Visit (INDEPENDENT_AMBULATORY_CARE_PROVIDER_SITE_OTHER): Payer: Medicare Other | Admitting: Sports Medicine

## 2018-08-11 DIAGNOSIS — S20309A Unspecified superficial injuries of unspecified front wall of thorax, initial encounter: Secondary | ICD-10-CM

## 2018-08-11 DIAGNOSIS — W19XXXD Unspecified fall, subsequent encounter: Secondary | ICD-10-CM | POA: Diagnosis not present

## 2018-08-11 DIAGNOSIS — W19XXXA Unspecified fall, initial encounter: Secondary | ICD-10-CM | POA: Insufficient documentation

## 2018-08-11 NOTE — Progress Notes (Signed)
Subjective:    CC: Follow-up after fall  HPI: [facility] returns, she had a fall, she was seen by Dr. Georgina Snell, x-rays were all negative, she is a bit bruised up but overall feels okay.  I reviewed the past medical history, family history, social history, surgical history, and allergies today and no changes were needed.  Please see the problem list section below in epic for further details.  Past Medical History: Past Medical History:  Diagnosis Date  . Arthritis   . Cancer ([place])   . Cataract    bilaterally  . Hypertension    Past Surgical History: Past Surgical History:  Procedure Laterality Date  . JOINT REPLACEMENT    . STOMACH SURGERY     Social History: Social History   Socioeconomic History  . Marital status: Widowed    Spouse name: Not on file  . Number of children: 4  . Years of education: 22  . Highest education level: 12th grade  Occupational History  . 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 Financial resource strain: Not hard at all  . Food insecurity:    Worry: Never true    Inability: Never true  . Transportation needs:    Medical: No    Non-medical: No  Tobacco Use  . Smoking status: Never Smoker  . Smokeless tobacco: Never Used  Substance and Sexual Activity  . Alcohol use: No  . Drug use: No  . Sexual activity: Not Currently  Lifestyle  . Physical activity:    Days per week: 0 days    Minutes per session: 0 min  . Stress: Not at all  Relationships  . Social connections:    Talks on phone: Twice a week    Gets together: Once a week    Attends religious service: Never    Active member of club or organization: No    Attends meetings of clubs or organizations: Never    Relationship status: Widowed  Other Topics Concern  . Not on file  Social History Narrative   Patient is going through a lot of health issues right now ? bone marrow biopsy   Family History: Family History  Problem Relation Age of Onset  .  Hypertension Mother   . Pulmonary embolism Mother   . Heart disease Father    Allergies: Allergies  Allergen Reactions  . Cefuroxime Anaphylaxis    Difficulty swallowing pills because they were too dry.  Caused tablet dysphagia.  . Diazepam Shortness Of Breath    HYPERVENTILATION     . Diphenhydramine Palpitations    Affected heart rate/er told her not to take again  . Latex Rash and Shortness Of Breath    Airway swelling  . Other Palpitations    Affected heart rate/er told her not to take again  . Amlodipine Cough and Other (See Comments)    Not sure  . Butorphanol Hives  . Diclofenac Hives  . Diclofenac Sodium Hives  . Diphenhydramine Hcl Palpitations    Affected heart rate/er told her not to take again  . Methylpyrrolidone Hives  . Ondansetron Hives and Other (See Comments)    Sedates/knocks her out    . Oxycodone Hives  . Penicillins Hives and Swelling    Most mycin drugs  . Quinolones Other (See Comments)    Joint pain  . Sulfa Antibiotics Hives and Swelling  . Butorphanol Tartrate Hives  . Lisinopril Cough    Caused pt to cough  .  Ramipril Cough    Pt c/o cough  . Ace Inhibitors Other (See Comments) and Cough    Lisinopril and ramipril   . Ciprofloxacin Rash    Patient prefers not to take Fluoroquinolones   . Codeine Nausea Only  . Pantoprazole Sodium Diarrhea   Medications: See med rec.  Review of Systems: No fevers, chills, night sweats, weight loss, chest pain, or shortness of breath.   Objective:    General: Well Developed, well nourished, and in no acute distress.  Neuro: Alert and oriented x3, extra-ocular muscles intact, sensation grossly intact.  HEENT: Normocephalic, atraumatic, pupils equal round reactive to light, neck supple, no masses, no lymphadenopathy, thyroid nonpalpable.  Skin: Warm and dry, no rashes. Cardiac: Regular rate and rhythm, no murmurs rubs or gallops, no lower extremity edema.  Respiratory: Clear to auscultation  bilaterally. Not using accessory muscles, speaking in full sentences.  Impression and Recommendations:    Accidental fall Doing much better, no fractures of the hip, no periprosthetic knee fractures, and no rib fractures. She is bruised but these are improving. We discussed her medications including her Eliquis. She is having a bone marrow biopsy on Monday, she will hold her Eliquis Saturday and Sunday. ___________________________________________ Gwen Her. Dianah Field, M.D., ABFM., CAQSM. Primary Care and Sports Medicine [facility]  Adjunct Professor of [employer]

## 2018-08-11 NOTE — Assessment & Plan Note (Signed)
Doing much better, no fractures of the hip, no periprosthetic knee fractures, and no rib fractures. She is bruised but these are improving. We discussed her medications including her Eliquis. She is having a bone marrow biopsy on Monday, she will hold her Eliquis Saturday and Sunday.

## 2018-08-14 DIAGNOSIS — Z7901 Long term (current) use of anticoagulants: Secondary | ICD-10-CM | POA: Diagnosis not present

## 2018-08-14 DIAGNOSIS — D469 Myelodysplastic syndrome, unspecified: Secondary | ICD-10-CM | POA: Diagnosis not present

## 2018-08-14 DIAGNOSIS — D803 Selective deficiency of immunoglobulin G [IgG] subclasses: Secondary | ICD-10-CM | POA: Diagnosis not present

## 2018-08-14 DIAGNOSIS — R718 Other abnormality of red blood cells: Secondary | ICD-10-CM | POA: Diagnosis not present

## 2018-08-14 DIAGNOSIS — D638 Anemia in other chronic diseases classified elsewhere: Secondary | ICD-10-CM | POA: Diagnosis not present

## 2018-08-14 DIAGNOSIS — Z86711 Personal history of pulmonary embolism: Secondary | ICD-10-CM | POA: Diagnosis not present

## 2018-08-14 DIAGNOSIS — D46C Myelodysplastic syndrome with isolated del(5q) chromosomal abnormality: Secondary | ICD-10-CM | POA: Diagnosis not present

## 2018-08-14 DIAGNOSIS — D472 Monoclonal gammopathy: Secondary | ICD-10-CM | POA: Diagnosis not present

## 2018-08-18 ENCOUNTER — Telehealth: Payer: Self-pay

## 2018-08-18 MED ORDER — HYDROXYZINE HCL 10 MG PO TABS: 10.0000 mg | ORAL_TABLET | Freq: Three times a day (TID) | ORAL | 0 refills | Status: DC | PRN

## 2018-08-18 MED ORDER — CARVEDILOL 25 MG PO TABS: 25.0000 mg | ORAL_TABLET | Freq: Two times a day (BID) | ORAL | 0 refills | Status: DC

## 2018-08-18 NOTE — Telephone Encounter (Signed)
G[facility]ia [facility]ay called and states she has terrible itching. She did recently change laundry detergent. Denies shortness of breath, mouth or throat swelling. She would like a refill on hydroxyzine. Historical provider.

## 2018-08-18 NOTE — Telephone Encounter (Signed)
[facility]

## 2018-08-21 DIAGNOSIS — E559 Vitamin D deficiency, unspecified: Secondary | ICD-10-CM | POA: Diagnosis not present

## 2018-08-21 DIAGNOSIS — D638 Anemia in other chronic diseases classified elsewhere: Secondary | ICD-10-CM | POA: Diagnosis not present

## 2018-08-21 DIAGNOSIS — N184 Chronic kidney disease, stage 4 (severe): Secondary | ICD-10-CM | POA: Diagnosis not present

## 2018-08-21 DIAGNOSIS — I10 Essential (primary) hypertension: Secondary | ICD-10-CM | POA: Diagnosis not present

## 2018-08-28 DIAGNOSIS — D803 Selective deficiency of immunoglobulin G [IgG] subclasses: Secondary | ICD-10-CM | POA: Diagnosis not present

## 2018-08-28 DIAGNOSIS — D46C Myelodysplastic syndrome with isolated del(5q) chromosomal abnormality: Secondary | ICD-10-CM | POA: Insufficient documentation

## 2018-08-28 DIAGNOSIS — Z7901 Long term (current) use of anticoagulants: Secondary | ICD-10-CM | POA: Diagnosis not present

## 2018-08-28 DIAGNOSIS — D472 Monoclonal gammopathy: Secondary | ICD-10-CM | POA: Diagnosis not present

## 2018-08-28 DIAGNOSIS — Z86711 Personal history of pulmonary embolism: Secondary | ICD-10-CM | POA: Diagnosis not present

## 2018-08-28 DIAGNOSIS — D469 Myelodysplastic syndrome, unspecified: Secondary | ICD-10-CM | POA: Diagnosis not present

## 2018-08-28 DIAGNOSIS — R718 Other abnormality of red blood cells: Secondary | ICD-10-CM | POA: Diagnosis not present

## 2018-08-28 DIAGNOSIS — D638 Anemia in other chronic diseases classified elsewhere: Secondary | ICD-10-CM | POA: Diagnosis not present

## 2018-09-04 ENCOUNTER — Other Ambulatory Visit: Payer: Self-pay

## 2018-09-04 ENCOUNTER — Telehealth: Payer: Self-pay

## 2018-09-04 DIAGNOSIS — G8928 Other chronic postprocedural pain: Secondary | ICD-10-CM

## 2018-09-04 DIAGNOSIS — I10 Essential (primary) hypertension: Secondary | ICD-10-CM

## 2018-09-04 MED ORDER — HYDROXYZINE HCL 10 MG PO TABS: 10.0000 mg | ORAL_TABLET | Freq: Three times a day (TID) | ORAL | 0 refills | Status: DC | PRN

## 2018-09-04 MED ORDER — CARVEDILOL 25 MG PO TABS: 25.0000 mg | ORAL_TABLET | Freq: Two times a day (BID) | ORAL | 0 refills | Status: DC

## 2018-09-04 MED ORDER — LOSARTAN POTASSIUM 100 MG PO TABS: 100.0000 mg | ORAL_TABLET | Freq: Every day | ORAL | 0 refills | Status: DC

## 2018-09-04 MED ORDER — HYDRALAZINE HCL 25 MG PO TABS: 25.0000 mg | ORAL_TABLET | Freq: Two times a day (BID) | ORAL | 3 refills | Status: DC

## 2018-09-04 MED ORDER — HYDROCODONE-ACETAMINOPHEN 7.5-325 MG PO TABS: 1.0000 | ORAL_TABLET | Freq: Four times a day (QID) | ORAL | 0 refills | Status: DC | PRN

## 2018-09-04 MED ORDER — APIXABAN 2.5 MG PO TABS: 2.5000 mg | ORAL_TABLET | Freq: Two times a day (BID) | ORAL | 3 refills | Status: DC

## 2018-09-04 MED ORDER — CALCIUM CARBONATE-VITAMIN D 600-400 MG-UNIT PO CHEW: 2.0000 | CHEWABLE_TABLET | Freq: Every day | ORAL | 12 refills | Status: DC

## 2018-09-04 MED ORDER — AMLODIPINE BESYLATE 10 MG PO TABS: 10.0000 mg | ORAL_TABLET | Freq: Every day | ORAL | 3 refills | Status: AC

## 2018-09-04 NOTE — Telephone Encounter (Signed)
[facility] requesting med refill for hydrocodone-ace. Pt no longer using [facility] mail [facility] or [facility] due to change in insurance.

## 2018-09-04 NOTE — Telephone Encounter (Signed)
Sent refill to fill 09/07/18, she's a little early

## 2018-09-05 NOTE — Telephone Encounter (Signed)
Pt has been updated. No other inquiries during call.  

## 2018-09-07 ENCOUNTER — Ambulatory Visit (INDEPENDENT_AMBULATORY_CARE_PROVIDER_SITE_OTHER): Payer: Medicare Other | Admitting: Obstetrics & Gynecology

## 2018-09-07 ENCOUNTER — Encounter: Payer: Self-pay | Admitting: Obstetrics & Gynecology

## 2018-09-07 ENCOUNTER — Other Ambulatory Visit (HOSPITAL_COMMUNITY)
Admission: RE | Admit: 2018-09-07 | Discharge: 2018-09-07 | Disposition: A | Discharge status: Home / Self Care | Payer: Medicare Other | Source: Ambulatory Visit | Attending: Obstetrics & Gynecology | Admitting: Obstetrics & Gynecology

## 2018-09-07 ENCOUNTER — Ambulatory Visit (INDEPENDENT_AMBULATORY_CARE_PROVIDER_SITE_OTHER): Payer: Medicare Other | Admitting: Osteopathic Medicine

## 2018-09-07 VITALS — BP 149/72 | HR 58 | Wt 115.0 lb

## 2018-09-07 DIAGNOSIS — L292 Pruritus vulvae: Secondary | ICD-10-CM | POA: Diagnosis not present

## 2018-09-07 DIAGNOSIS — I824Y9 Acute embolism and thrombosis of unspecified deep veins of unspecified proximal lower extremity: Secondary | ICD-10-CM

## 2018-09-07 DIAGNOSIS — D6859 Other primary thrombophilia: Secondary | ICD-10-CM | POA: Diagnosis not present

## 2018-09-07 DIAGNOSIS — Z7901 Long term (current) use of anticoagulants: Secondary | ICD-10-CM | POA: Diagnosis not present

## 2018-09-07 DIAGNOSIS — I480 Paroxysmal atrial fibrillation: Secondary | ICD-10-CM | POA: Diagnosis not present

## 2018-09-07 DIAGNOSIS — N952 Postmenopausal atrophic vaginitis: Secondary | ICD-10-CM

## 2018-09-07 LAB — POCT INR: INR: 1 — AB (ref 2.0–3.0)

## 2018-09-07 MED ORDER — ENOXAPARIN SODIUM 30 MG/0.3ML ~~LOC~~ SOLN: 30.0000 mg | SUBCUTANEOUS | 0 refills | Status: DC

## 2018-09-07 MED ORDER — PRASTERONE 6.5 MG VA INST: 1.0000 | VAGINAL_INSERT | Freq: Every day | VAGINAL | 12 refills | Status: DC

## 2018-09-07 NOTE — Progress Notes (Addendum)
PT needed blood work done. I could not get a vein and lab was done taking patients for the day. Paperwork given to pt and she will return to lab for blood to be drawn.

## 2018-09-07 NOTE — Patient Instructions (Addendum)
Plan: Will need to be on Lovenox shots and restart the Coumadin at the same time Will monitor INR and can stop the shots when the INR is between 2 and 3 Plan to recheck INR on Monday here in the office

## 2018-09-07 NOTE — Progress Notes (Signed)
   Subjective:    Patient ID: Gloria [facility]ckay, female    DOB: 1941-03-26, 78 y.o.   MRN: [id number]  HPI  78 yo lady here with extreme vulvar itching. She was prescribed a diflucan but she did not get any relief. She is abstinent.  Review of Systems     Objective:   Physical Exam Breathing, conversing normally She ambulates using a cane. Well nourished, well hydrated White female, no apparent distress Vulva- paucity of pubic hair Excoriations noted on the labia majora due to scratching Extreme vulvovaginal atrophy Speculum exam reveals VVA and absolutely no discharge     Assessment & Plan:  Extreme vulvar itching Check HSV 2 IgG Treat with intrarosa nightly Come back 1 month

## 2018-09-07 NOTE — Addendum Note (Signed)
Addended by: Lyndal Rainbow on: 09/07/2018 04:43 PM   Modules accepted: Orders

## 2018-09-07 NOTE — Progress Notes (Signed)
Stopped Eliquis 3 days ago Not back on coumadin Needs bridging, typically we'd just start coumadin and stay on apixiban x3 days then stop that, but now she's going to need bridging  Pt asked to RTC to discuss    BP (!) 146/77   Pulse 60  Wt Readings from Last 3 Encounters:  09/07/18 115 lb (52.2 kg)  08/11/18 113 lb (51.3 kg)  08/01/18 113 lb (51.3 kg)   CrCl 23 = renal dose lovenox until therapeutic INR Will restart coumadin at 5 mg daily and recheck Monday (today Thursday)  Instructions provided and all questions answered

## 2018-09-11 ENCOUNTER — Ambulatory Visit (INDEPENDENT_AMBULATORY_CARE_PROVIDER_SITE_OTHER): Payer: Medicare Other | Admitting: Osteopathic Medicine

## 2018-09-11 DIAGNOSIS — Z7901 Long term (current) use of anticoagulants: Secondary | ICD-10-CM | POA: Diagnosis not present

## 2018-09-11 DIAGNOSIS — D6859 Other primary thrombophilia: Secondary | ICD-10-CM

## 2018-09-11 DIAGNOSIS — I480 Paroxysmal atrial fibrillation: Secondary | ICD-10-CM

## 2018-09-11 DIAGNOSIS — D46C Myelodysplastic syndrome with isolated del(5q) chromosomal abnormality: Secondary | ICD-10-CM | POA: Diagnosis not present

## 2018-09-11 DIAGNOSIS — L292 Pruritus vulvae: Secondary | ICD-10-CM | POA: Diagnosis not present

## 2018-09-11 DIAGNOSIS — I824Y9 Acute embolism and thrombosis of unspecified deep veins of unspecified proximal lower extremity: Secondary | ICD-10-CM

## 2018-09-11 LAB — CERVICOVAGINAL ANCILLARY ONLY
BACTERIAL VAGINITIS: NEGATIVE
Candida vaginitis: NEGATIVE

## 2018-09-11 LAB — POCT INR: INR: 1.2 — AB (ref 2.0–3.0)

## 2018-09-11 MED ORDER — ENOXAPARIN SODIUM 30 MG/0.3ML ~~LOC~~ SOLN: 30.0000 mg | SUBCUTANEOUS | 0 refills | Status: DC

## 2018-09-11 NOTE — Progress Notes (Signed)
See anticoag track.

## 2018-09-12 ENCOUNTER — Ambulatory Visit: Payer: Medicare Other

## 2018-09-12 LAB — HSV 2 ANTIBODY, IGG: HSV 2 Glycoprotein G Ab, IgG: <0.9 index

## 2018-09-15 ENCOUNTER — Ambulatory Visit (INDEPENDENT_AMBULATORY_CARE_PROVIDER_SITE_OTHER): Payer: Medicare Other | Admitting: Osteopathic Medicine

## 2018-09-15 ENCOUNTER — Ambulatory Visit: Payer: Medicare Other

## 2018-09-15 DIAGNOSIS — I824Y9 Acute embolism and thrombosis of unspecified deep veins of unspecified proximal lower extremity: Secondary | ICD-10-CM

## 2018-09-15 DIAGNOSIS — I480 Paroxysmal atrial fibrillation: Secondary | ICD-10-CM

## 2018-09-15 DIAGNOSIS — Z7901 Long term (current) use of anticoagulants: Secondary | ICD-10-CM

## 2018-09-15 DIAGNOSIS — D6859 Other primary thrombophilia: Secondary | ICD-10-CM

## 2018-09-15 LAB — POCT INR: INR: 1.7 — AB (ref 2.0–3.0)

## 2018-09-15 MED ORDER — ENOXAPARIN SODIUM 30 MG/0.3ML ~~LOC~~ SOLN: 30.0000 mg | SUBCUTANEOUS | 0 refills | Status: DC

## 2018-09-15 NOTE — Progress Notes (Signed)
Pt in today for INR check. Pt reports taking   7.5 mg M/W/F/SA, 5 mg all others. Pt reports no change in diet, no bleeding or bruising, or SOB.  Pt took her last lovenox injection today.Todays INR was 1.7 Results discussed with provider. The patient will need to continue lovenox injections until therapeutic. Rx sent to pharmacy. Pt is to take 7.5 mg daily and return for INR on Monday or Tuesday.

## 2018-09-19 ENCOUNTER — Ambulatory Visit (INDEPENDENT_AMBULATORY_CARE_PROVIDER_SITE_OTHER): Payer: Medicare Other | Admitting: Osteopathic Medicine

## 2018-09-19 DIAGNOSIS — Z7901 Long term (current) use of anticoagulants: Secondary | ICD-10-CM

## 2018-09-19 DIAGNOSIS — I480 Paroxysmal atrial fibrillation: Secondary | ICD-10-CM

## 2018-09-19 DIAGNOSIS — D46C Myelodysplastic syndrome with isolated del(5q) chromosomal abnormality: Secondary | ICD-10-CM | POA: Diagnosis not present

## 2018-09-19 DIAGNOSIS — I824Y9 Acute embolism and thrombosis of unspecified deep veins of unspecified proximal lower extremity: Secondary | ICD-10-CM

## 2018-09-19 DIAGNOSIS — D472 Monoclonal gammopathy: Secondary | ICD-10-CM | POA: Diagnosis not present

## 2018-09-19 DIAGNOSIS — G8928 Other chronic postprocedural pain: Secondary | ICD-10-CM

## 2018-09-19 DIAGNOSIS — D6859 Other primary thrombophilia: Secondary | ICD-10-CM

## 2018-09-19 LAB — POCT INR: INR: 2.8 (ref 2.0–3.0)

## 2018-09-19 MED ORDER — HYDROCODONE-ACETAMINOPHEN 7.5-325 MG PO TABS: 1.0000 | ORAL_TABLET | Freq: Four times a day (QID) | ORAL | 0 refills | Status: DC | PRN

## 2018-09-19 NOTE — Progress Notes (Signed)
5 mg on Weds 7.5 all other days Recheck Mon 09/25/18

## 2018-09-19 NOTE — Progress Notes (Signed)
RX pended for [employer] I spoke to becky at [facility] and she states that pt's insurance would only allow an initial 7 day supply.. needs a refill to be send. Becky at pharmacy states we should try to send 30 day again so she can run through insurance and see if covered. She states she will call the office back with an FYI if ins only allows pt to get 7 day supply again

## 2018-09-20 ENCOUNTER — Telehealth: Payer: Self-pay

## 2018-09-20 NOTE — Telephone Encounter (Signed)
[facility] requesting med refill for warfarin 7.5 mg. Written by historical provider.

## 2018-09-20 NOTE — Telephone Encounter (Signed)
Ok to refill 30 w/ 1 refill

## 2018-09-21 MED ORDER — WARFARIN SODIUM 7.5 MG PO TABS: 7.5000 mg | ORAL_TABLET | Freq: Every day | ORAL | 1 refills | Status: DC

## 2018-09-21 NOTE — Telephone Encounter (Signed)
Unable to send warfarin 7.5 mg rx. Written by historical provider. Pls send to [facility]. Thanks.

## 2018-09-21 NOTE — Telephone Encounter (Signed)
Sent!

## 2018-09-22 ENCOUNTER — Ambulatory Visit: Payer: Medicare Other

## 2018-09-23 ENCOUNTER — Other Ambulatory Visit: Payer: Self-pay | Admitting: Cardiology

## 2018-09-25 ENCOUNTER — Ambulatory Visit: Payer: Medicare Other

## 2018-09-25 ENCOUNTER — Ambulatory Visit (INDEPENDENT_AMBULATORY_CARE_PROVIDER_SITE_OTHER): Payer: Medicare Other

## 2018-09-25 ENCOUNTER — Encounter: Payer: Self-pay | Admitting: Osteopathic Medicine

## 2018-09-25 ENCOUNTER — Ambulatory Visit (INDEPENDENT_AMBULATORY_CARE_PROVIDER_SITE_OTHER): Payer: Medicare Other | Admitting: Osteopathic Medicine

## 2018-09-25 VITALS — BP 110/61 | HR 61 | Temp 98.0°F | Wt 116.3 lb

## 2018-09-25 DIAGNOSIS — R502 Drug induced fever: Secondary | ICD-10-CM

## 2018-09-25 DIAGNOSIS — R079 Chest pain, unspecified: Secondary | ICD-10-CM | POA: Diagnosis not present

## 2018-09-25 DIAGNOSIS — N179 Acute kidney failure, unspecified: Secondary | ICD-10-CM | POA: Diagnosis not present

## 2018-09-25 DIAGNOSIS — Z7901 Long term (current) use of anticoagulants: Secondary | ICD-10-CM | POA: Diagnosis not present

## 2018-09-25 DIAGNOSIS — R0789 Other chest pain: Secondary | ICD-10-CM

## 2018-09-25 DIAGNOSIS — D649 Anemia, unspecified: Secondary | ICD-10-CM

## 2018-09-25 DIAGNOSIS — R0609 Other forms of dyspnea: Secondary | ICD-10-CM | POA: Diagnosis not present

## 2018-09-25 DIAGNOSIS — R0602 Shortness of breath: Secondary | ICD-10-CM | POA: Diagnosis not present

## 2018-09-25 LAB — POCT INR: INR: 2.8 (ref 2.0–3.0)

## 2018-09-25 MED ORDER — LOSARTAN POTASSIUM 100 MG PO TABS: 100.0000 mg | ORAL_TABLET | Freq: Every day | ORAL | 0 refills | Status: DC

## 2018-09-25 NOTE — Patient Instructions (Addendum)
Chest pain!   The only way to definitively rule out a heart attack is with monitoring in the hospital to measure blood levels of cardiac enzymes and repeat EKG's over time. A normal EKG in the doctor's office DOES NOT mean you are "fine."   In the outpatient setting, we can fairly easily evaluate for musculoskeletal or respiratory causes of chest pain. We are NOT equipped to diagnose a heart attack unless it is obvious on EKG and we are therefore NOT equipped to definitively rule out a heart attack! If you are a patient with cardiac disease or other risk factors, we will always recommend full evaluation in the hospital.  If you choose NOT to go to the emergency room / call 911 for chest pain, you do so at your own risk and this includes risk of death.     Other  Will get Xray and labs today for further workup. Will alert your Hematology/Oncology team of the fever but I think this is likely due to the medications.

## 2018-09-25 NOTE — Progress Notes (Addendum)
HPI: Gloria Mckay is a 78 y.o. female who  has a past medical history of Arthritis, Cancer ([place]), Cataract, Hypertension, and Myelodysplasia (myelodysplastic syndrome) ([place]).  she presents to [facility] today, 09/25/18,  for chief complaint of:  INR check  Fever   Fever: Recently started Lenalomide last week for her myelodysplastic syndrome, reported fever at home up to 101 yesterday morning, no fever now.   INR today at goal at 2.8 on Warfarin 7.5 mg all days except 5 mg on Weds. No concerns with increased bleeding.   Chest pain Sat/Sun night (today is Monday) x2 hours or so, at rest. Earlier that day with DOE walking down the hall to her apartment. Pain was on L side of sternum, wrapping around ribs. Reports worry for pneumonia.        At today's visit 09/25/18 ... PMH, PSH, FH reviewed and updated as needed.  Current medication list and allergy/intolerance hx reviewed and updated as needed. (See remainder of HPI, ROS, Phys Exam below)   No results found.  Results for orders placed or performed in visit on 09/25/18 (from the past 72 hour(s))  POCT INR     Status: None   Collection Time: 09/25/18  1:18 PM  Result Value Ref Range   INR 2.8 2.0 - 3.0    EKG interpretation: Rate: 55 Rhythm: sinus Old Q waves and T inversions III stable ?new nonspecific ST changes in II and aVF Previous EKG viewed 06/2018       ASSESSMENT/PLAN: The primary encounter diagnosis was Drug-induced fever. Diagnoses of Long term (current) use of anticoagulants, Other chest pain, and Dyspnea on exertion were also pertinent to this visit.   Orders Placed This Encounter  Procedures  . DG Chest 2 View  . COMPLETE METABOLIC PANEL WITH GFR  . Urinalysis, Routine w reflex microscopic  . CBC (INCLUDES DIFF/PLT) WITH PATHOLOGIST REVIEW  . B Nat Peptide  . POCT INR  . EKG 12-Lead  . Rhythm ECG, report     Refilled  Meds ordered this encounter  Medications   . losartan (COZAAR) 100 MG tablet    Sig: Take 1 tablet (100 mg total) by mouth daily.    Dispense:  90 tablet    Refill:  0    Patient Instructions  Chest pain!   The only way to definitively rule out a heart attack is with monitoring in the hospital to measure blood levels of cardiac enzymes and repeat EKG's over time. A normal EKG in the doctor's office DOES NOT mean you are "fine."   In the outpatient setting, we can fairly easily evaluate for musculoskeletal or respiratory causes of chest pain. We are NOT equipped to diagnose a heart attack unless it is obvious on EKG and we are therefore NOT equipped to definitively rule out a heart attack! If you are a patient with cardiac disease or other risk factors, we will always recommend full evaluation in the hospital.  If you choose NOT to go to the emergency room / call 911 for chest pain, you do so at your own risk and this includes risk of death.     Other  Will get Xray and labs today for further workup. Will alert your Hematology/Oncology team of the fever but I think this is likely due to the medications.            ADDENDUM, 09/26/18   Results for orders placed or performed in visit on 09/25/18 (from the  past 24 hour(s))  POCT INR     Status: None   Collection Time: 09/25/18  1:18 PM  Result Value Ref Range   INR 2.8 2.0 - 3.0  COMPLETE METABOLIC PANEL WITH GFR     Status: Abnormal   Collection Time: 09/25/18  2:22 PM  Result Value Ref Range   Glucose, Bld 87 65 - 99 mg/dL   BUN 32 (H) 7 - 25 mg/dL   Creat 2.12 (H) 0.60 - 0.93 mg/dL   GFR, Est Non African American 22 (L) > OR = 60 mL/min/1.47m2   GFR, Est African American 26 (L) > OR = 60 mL/min/1.42m2   BUN/Creatinine Ratio 15 6 - 22 (calc)   Sodium 143 135 - 146 mmol/L   Potassium 4.1 3.5 - 5.3 mmol/L   Chloride 116 (H) 98 - 110 mmol/L   CO2 18 (L) 20 - 32 mmol/L   Calcium 8.1 (L) 8.6 - 10.4 mg/dL   Total Protein 5.9 (L) 6.1 - 8.1 g/dL   Albumin 3.8 3.6 -  5.1 g/dL   Globulin 2.1 1.9 - 3.7 g/dL (calc)   AG Ratio 1.8 1.0 - 2.5 (calc)   Total Bilirubin 0.2 0.2 - 1.2 mg/dL   Alkaline phosphatase (APISO) 76 33 - 130 U/L   AST 11 10 - 35 U/L   ALT 13 6 - 29 U/L  Urinalysis, Routine w reflex microscopic     Status: Abnormal   Collection Time: 09/25/18  2:22 PM  Result Value Ref Range   Color, Urine YELLOW YELLOW   APPearance CLEAR CLEAR   Specific Gravity, Urine 1.015 1.001 - 1.03   pH 5.5 5.0 - 8.0   Glucose, UA NEGATIVE NEGATIVE   Bilirubin Urine NEGATIVE NEGATIVE   Ketones, ur NEGATIVE NEGATIVE   Hgb urine dipstick NEGATIVE NEGATIVE   Protein, ur NEGATIVE NEGATIVE   Nitrite NEGATIVE NEGATIVE   Leukocytes, UA TRACE (A) NEGATIVE   WBC, UA 6-10 (A) 0 - 5 /HPF   RBC / HPF NONE SEEN 0 - 2 /HPF   Squamous Epithelial / LPF 0-5 < OR = 5 /HPF   Bacteria, UA NONE SEEN NONE SEEN /HPF   Hyaline Cast NONE SEEN NONE SEEN /LPF  CBC (INCLUDES DIFF/PLT) WITH PATHOLOGIST REVIEW     Status: Abnormal (Preliminary result)   Collection Time: 09/25/18  2:22 PM  Result Value Ref Range   WBC 6.6 3.8 - 10.8 Thousand/uL   RBC 2.81 (L) 3.80 - 5.10 Million/uL   Hemoglobin 8.5 (L) 11.7 - 15.5 g/dL   HCT 26.9 (L) 35.0 - 45.0 %   MCV 95.7 80.0 - 100.0 fL   MCH 30.2 27.0 - 33.0 pg   MCHC 31.6 (L) 32.0 - 36.0 g/dL   RDW 12.6 11.0 - 15.0 %   Platelets 207 140 - 400 Thousand/uL   MPV 12.0 7.5 - 12.5 fL   Neutro Abs 3,960 1,500 - 7,800 cells/uL   Lymphs Abs 1,234 850 - 3,900 cells/uL   Absolute Monocytes 779 200 - 950 cells/uL   Eosinophils Absolute 548 (H) 15 - 500 cells/uL   Basophils Absolute 79 0 - 200 cells/uL   Neutrophils Relative % 60 %   Total Lymphocyte 18.7 %   Monocytes Relative 11.8 %   Eosinophils Relative 8.3 %   Basophils Relative 1.2 %   Comment    Brain natriuretic peptide     Status: Abnormal   Collection Time: 09/25/18  2:22 PM  Result Value Ref Range  Brain Natriuretic Peptide 353 (H) <100 pg/mL   Dg Chest 2 View  Result Date:  09/26/2018 CLINICAL DATA:  Fever.  Chest pain.  Shortness of breath EXAM: CHEST - 2 VIEW COMPARISON:  08/01/2018. FINDINGS: Mediastinum hilar structures normal. Stable cardiomegaly with normal pulmonary vascularity. No pleural effusion pneumothorax. Postsurgical changes noted the chest and abdomen. IVC filter noted in stable position. IMPRESSION: 1.  Stable cardiomegaly.  No pulmonary venous congestion. 2.  No acute pulmonary infiltrate. Electronically Signed   By: Marcello Moores  Register   On: 09/26/2018 07:57   Will forward labs and notes to specialists.   See lab result note below...  "Please call lab:  I think the answer is probably NO but if we can add on urine culture for dx abnormal urinalysis?    Please call patient, I also sent MyChart message - they should review that for full details.   Kidney function has decreased - possibly due to dehydration but possibly also due to medications. I'll route results to nephology and hematology/oncology doctors and make sure they're aware. We should recheck blood work tomorrow to make sure it's not getting worse. Doesn't have to be done fasting. Orders are in   Anemia is worse, will see if hematology/oncology is concerned about this.   Labs also concerning for decreased heart function which may also explain her shortness of breath, but chest Xray was fine which was reassuring. I sent order for echocardiogram (heart ultrasound) to further evaluate. If chest pain or breathing trouble is worse, to ER!   There were some white blood cells in the urine. If lab isn't able to add on a urine culture to the specimen already collected, will get this done when at the lab tomorrow."    Follow-up plan: Return in about 4 weeks (around 10/23/2018) for INR check unless need to see me sooner  .                                                 ################################################# ################################################# ################################################# #################################################    Current Meds  Medication Sig  . amLODipine (NORVASC) 10 MG tablet Take 1 tablet (10 mg total) by mouth daily.  . Calcium Carbonate-Vitamin D 600-400 MG-UNIT chew tablet Chew 2 tablets by mouth daily.  . carvedilol (COREG) 25 MG tablet Take 1 tablet (25 mg total) by mouth 2 (two) times daily with a meal.  . Cholecalciferol (VITAMIN D3) 50000 units CAPS Take 1 tablet by mouth daily.  . clindamycin (CLEOCIN) 300 MG capsule TAKE 1 CAPSULE BY MOUTH TWICE A DAY  . denosumab (PROLIA) 60 MG/ML SOSY injection Inject 60 mg into the skin every 6 (six) months. Administer in upper arm, thigh, or abdomen  . hydrALAZINE (APRESOLINE) 25 MG tablet Take 1 tablet (25 mg total) by mouth 2 (two) times daily.  Marland Kitchen HYDROcodone-acetaminophen (NORCO) 7.5-325 MG tablet Take 1 tablet by mouth every 6 (six) hours as needed for up to 30 days for moderate pain. Use sparingly. #60 for 30(thirty) days  . hydrOXYzine (ATARAX/VISTARIL) 10 MG tablet Take 1-2 tablets (10-20 mg total) by mouth 3 (three) times daily as needed for itching.  . losartan (COZAAR) 100 MG tablet Take 1 tablet (100 mg total) by mouth daily.  . mometasone (ELOCON) 0.1 % cream Apply 1 application topically daily.  . mupirocin ointment (BACTROBAN) 2 % Place 1 application  into the nose 2 (two) times daily.  . Omega-3 Fatty Acids ([employer] FISH OIL PO) Take by mouth.  . Prasterone (INTRAROSA) 6.5 MG INST Place 1 suppository vaginally at bedtime.  . topiramate (TOPAMAX) 100 MG tablet Take 100 mg by mouth 2 (two) times daily.  Marland Kitchen warfarin (COUMADIN) 5 MG tablet Take 5 mg by mouth daily.  Marland Kitchen warfarin (COUMADIN) 7.5 MG tablet Take 1 tablet (7.5 mg total) by mouth daily.  .  [DISCONTINUED] losartan (COZAAR) 100 MG tablet Take 1 tablet (100 mg total) by mouth daily.    Allergies  Allergen Reactions  . Cefuroxime Anaphylaxis    Difficulty swallowing pills because they were too dry.  Caused tablet dysphagia.  . Diazepam Shortness Of Breath    HYPERVENTILATION     . Diphenhydramine Palpitations    Affected heart rate/er told her not to take again  . Latex Rash and Shortness Of Breath    Airway swelling  . Other Palpitations    Affected heart rate/er told her not to take again  . Amlodipine Cough and Other (See Comments)    Not sure  . Butorphanol Hives  . Diclofenac Hives  . Diclofenac Sodium Hives  . Diphenhydramine Hcl Palpitations    Affected heart rate/er told her not to take again  . Methylpyrrolidone Hives  . Ondansetron Hives and Other (See Comments)    Sedates/knocks her out    . Oxycodone Hives  . Penicillins Hives and Swelling    Most mycin drugs  . Quinolones Other (See Comments)    Joint pain  . Sulfa Antibiotics Hives and Swelling  . Butorphanol Tartrate Hives  . Lisinopril Cough    Caused pt to cough  . Ramipril Cough    Pt c/o cough  . Ace Inhibitors Other (See Comments) and Cough    Lisinopril and ramipril   . Ciprofloxacin Rash    Patient prefers not to take Fluoroquinolones   . Codeine Nausea Only  . Pantoprazole Sodium Diarrhea       Review of Systems:  Constitutional: No recent illness, +fever/chills as per HPI   HEENT: No  headache, no vision change  Cardiac: +chest pain, +pressure, No palpitations  Respiratory:  +shortness of breath. No  Cough  Gastrointestinal: No  abdominal pain, no change on bowel habits  Musculoskeletal: No new myalgia/arthralgia  Skin: No  Rash  Neurologic: No  weakness, No  Dizziness   Exam:  BP 110/61 (BP Location: Left Arm, Patient Position: Sitting, Cuff Size: Normal)   Pulse 61   Temp 98 F (36.7 C) (Oral)   Wt 116 lb 4.8 oz (52.8 kg)   BMI 20.60 kg/m    Constitutional: VS see above. General Appearance: alert, well-developed, well-nourished, NAD  Eyes: Normal lids and conjunctive, non-icteric sclera  Ears, Nose, Mouth, Throat: MMM, Normal external inspection ears/nares/mouth/lips/gums.  Neck: No masses, trachea midline.   Respiratory: Normal respiratory effort. no wheeze, no rhonchi, no rales  Cardiovascular: S1/S2 normal, no murmur, no rub/gallop auscultated. RRR.   Musculoskeletal: Gait normal. Symmetric and independent movement of all extremities  Neurological: Normal balance/coordination. No tremor.  Skin: warm, dry, intact.   Psychiatric: Normal judgment/insight. Normal mood and affect. Oriented x3.       Visit summary with medication list and pertinent instructions was printed for patient to review, patient was advised to alert [place] if any updates are needed. All questions at time of visit were answered - patient instructed to contact office with any additional concerns. ER/RTC precautions  were reviewed with the patient and understanding verbalized.   Note: Total time spent 40 minutes, greater than 50% of the visit was spent face-to-face counseling and coordinating care for the following: The primary encounter diagnosis was Drug-induced fever. Diagnoses of Long term (current) use of anticoagulants, Other chest pain, and Dyspnea on exertion were also pertinent to this visit.Marland Kitchen  Please note: voice recognition software was used to produce this document, and typos may escape review. Please contact Dr. Sheppard Coil for any needed clarifications.    Follow up plan: Return in about 4 weeks (around 10/23/2018) for INR check unless need to see me sooner .

## 2018-09-26 LAB — URINALYSIS, ROUTINE W REFLEX MICROSCOPIC
BILIRUBIN URINE: NEGATIVE
Bacteria, UA: NONE SEEN /HPF
GLUCOSE, UA: NEGATIVE
Hgb urine dipstick: NEGATIVE
Hyaline Cast: NONE SEEN /LPF
Ketones, ur: NEGATIVE
Nitrite: NEGATIVE
PROTEIN: NEGATIVE
RBC / HPF: NONE SEEN /HPF (ref 0–2)
Specific Gravity, Urine: 1.015 (ref 1.001–1.03)
pH: 5.5 (ref 5.0–8.0)

## 2018-09-26 LAB — CBC (INCLUDES DIFF/PLT) WITH PATHOLOGIST REVIEW
Absolute Monocytes: 779 cells/uL (ref 200–950)
BASOS ABS: 79 {cells}/uL (ref 0–200)
Basophils Relative: 1.2 %
Eosinophils Absolute: 548 cells/uL — ABNORMAL HIGH (ref 15–500)
Eosinophils Relative: 8.3 %
HCT: 26.9 % — ABNORMAL LOW (ref 35.0–45.0)
Hemoglobin: 8.5 g/dL — ABNORMAL LOW (ref 11.7–15.5)
Lymphs Abs: 1234 cells/uL (ref 850–3900)
MCH: 30.2 pg (ref 27.0–33.0)
MCHC: 31.6 g/dL — ABNORMAL LOW (ref 32.0–36.0)
MCV: 95.7 fL (ref 80.0–100.0)
MPV: 12 fL (ref 7.5–12.5)
Monocytes Relative: 11.8 %
Neutro Abs: 3960 cells/uL (ref 1500–7800)
Neutrophils Relative %: 60 %
Platelets: 207 10*3/uL (ref 140–400)
RBC: 2.81 10*6/uL — AB (ref 3.80–5.10)
RDW: 12.6 % (ref 11.0–15.0)
Total Lymphocyte: 18.7 %
WBC: 6.6 10*3/uL (ref 3.8–10.8)

## 2018-09-26 LAB — COMPLETE METABOLIC PANEL WITH GFR
AG Ratio: 1.8 (calc) (ref 1.0–2.5)
ALT: 13 U/L (ref 6–29)
AST: 11 U/L (ref 10–35)
Albumin: 3.8 g/dL (ref 3.6–5.1)
Alkaline phosphatase (APISO): 76 U/L (ref 33–130)
BUN/Creatinine Ratio: 15 (calc) (ref 6–22)
BUN: 32 mg/dL — ABNORMAL HIGH (ref 7–25)
CO2: 18 mmol/L — ABNORMAL LOW (ref 20–32)
CREATININE: 2.12 mg/dL — AB (ref 0.60–0.93)
Calcium: 8.1 mg/dL — ABNORMAL LOW (ref 8.6–10.4)
Chloride: 116 mmol/L — ABNORMAL HIGH (ref 98–110)
GFR, Est African American: 26 mL/min/{1.73_m2} — ABNORMAL LOW (ref >=60)
GFR, Est Non African American: 22 mL/min/{1.73_m2} — ABNORMAL LOW (ref >=60)
Globulin: 2.1 g/dL (calc) (ref 1.9–3.7)
Glucose, Bld: 87 mg/dL (ref 65–99)
Potassium: 4.1 mmol/L (ref 3.5–5.3)
Sodium: 143 mmol/L (ref 135–146)
Total Bilirubin: 0.2 mg/dL (ref 0.2–1.2)
Total Protein: 5.9 g/dL — ABNORMAL LOW (ref 6.1–8.1)

## 2018-09-26 LAB — BRAIN NATRIURETIC PEPTIDE: Brain Natriuretic Peptide: 353 pg/mL — ABNORMAL HIGH (ref <100)

## 2018-09-26 NOTE — Addendum Note (Signed)
Addended by: Maryla Morrow on: 09/26/2018 01:03 PM   Modules accepted: Orders

## 2018-09-27 ENCOUNTER — Telehealth: Payer: Self-pay | Admitting: Osteopathic Medicine

## 2018-09-27 DIAGNOSIS — R0789 Other chest pain: Secondary | ICD-10-CM | POA: Diagnosis not present

## 2018-09-27 DIAGNOSIS — D649 Anemia, unspecified: Secondary | ICD-10-CM | POA: Diagnosis not present

## 2018-09-27 DIAGNOSIS — Z7901 Long term (current) use of anticoagulants: Secondary | ICD-10-CM | POA: Diagnosis not present

## 2018-09-27 DIAGNOSIS — R0609 Other forms of dyspnea: Secondary | ICD-10-CM | POA: Diagnosis not present

## 2018-09-27 DIAGNOSIS — N179 Acute kidney failure, unspecified: Secondary | ICD-10-CM | POA: Diagnosis not present

## 2018-09-27 DIAGNOSIS — D46C Myelodysplastic syndrome with isolated del(5q) chromosomal abnormality: Secondary | ICD-10-CM | POA: Diagnosis not present

## 2018-09-27 DIAGNOSIS — R502 Drug induced fever: Secondary | ICD-10-CM | POA: Diagnosis not present

## 2018-09-27 NOTE — Telephone Encounter (Signed)
Pt is having the echocardiogram done at [facility]. They will be contacting her by the end of this week to set up an appt. If not, she will need to call them directly.

## 2018-09-27 NOTE — Telephone Encounter (Signed)
Gloria Mckay came into our office wondering about what are the steps needed for the echocardiogram.  She was confused if they would contact her or if she needed to contact them.   Please advise.

## 2018-09-28 DIAGNOSIS — D46C Myelodysplastic syndrome with isolated del(5q) chromosomal abnormality: Secondary | ICD-10-CM | POA: Diagnosis not present

## 2018-09-28 DIAGNOSIS — E559 Vitamin D deficiency, unspecified: Secondary | ICD-10-CM | POA: Diagnosis not present

## 2018-09-28 DIAGNOSIS — D500 Iron deficiency anemia secondary to blood loss (chronic): Secondary | ICD-10-CM | POA: Diagnosis not present

## 2018-09-28 LAB — BASIC METABOLIC PANEL WITH GFR
BUN/Creatinine Ratio: 18 (calc) (ref 6–22)
BUN: 30 mg/dL — ABNORMAL HIGH (ref 7–25)
CO2: 16 mmol/L — ABNORMAL LOW (ref 20–32)
Calcium: 8.1 mg/dL — ABNORMAL LOW (ref 8.6–10.4)
Chloride: 118 mmol/L — ABNORMAL HIGH (ref 98–110)
Creat: 1.67 mg/dL — ABNORMAL HIGH (ref 0.60–0.93)
GFR, Est African American: 34 mL/min/{1.73_m2} — ABNORMAL LOW (ref >=60)
GFR, Est Non African American: 29 mL/min/{1.73_m2} — ABNORMAL LOW (ref >=60)
Glucose, Bld: 89 mg/dL (ref 65–99)
Potassium: 4.3 mmol/L (ref 3.5–5.3)
Sodium: 146 mmol/L (ref 135–146)

## 2018-09-28 LAB — CBC
HCT: 26.5 % — ABNORMAL LOW (ref 35.0–45.0)
Hemoglobin: 8.4 g/dL — ABNORMAL LOW (ref 11.7–15.5)
MCH: 30.7 pg (ref 27.0–33.0)
MCHC: 31.7 g/dL — ABNORMAL LOW (ref 32.0–36.0)
MCV: 96.7 fL (ref 80.0–100.0)
MPV: 11.7 fL (ref 7.5–12.5)
Platelets: 196 10*3/uL (ref 140–400)
RBC: 2.74 10*6/uL — ABNORMAL LOW (ref 3.80–5.10)
RDW: 12.9 % (ref 11.0–15.0)
WBC: 7.5 10*3/uL (ref 3.8–10.8)

## 2018-10-02 ENCOUNTER — Encounter: Payer: Self-pay | Admitting: Osteopathic Medicine

## 2018-10-02 DIAGNOSIS — S098XXA Other specified injuries of head, initial encounter: Secondary | ICD-10-CM | POA: Diagnosis not present

## 2018-10-02 DIAGNOSIS — M549 Dorsalgia, unspecified: Secondary | ICD-10-CM | POA: Diagnosis not present

## 2018-10-02 DIAGNOSIS — M009 Pyogenic arthritis, unspecified: Secondary | ICD-10-CM | POA: Diagnosis not present

## 2018-10-02 DIAGNOSIS — M542 Cervicalgia: Secondary | ICD-10-CM | POA: Diagnosis not present

## 2018-10-02 DIAGNOSIS — W19XXXA Unspecified fall, initial encounter: Secondary | ICD-10-CM | POA: Diagnosis not present

## 2018-10-02 DIAGNOSIS — J4520 Mild intermittent asthma, uncomplicated: Secondary | ICD-10-CM | POA: Diagnosis not present

## 2018-10-02 DIAGNOSIS — I480 Paroxysmal atrial fibrillation: Secondary | ICD-10-CM | POA: Diagnosis not present

## 2018-10-02 DIAGNOSIS — Z86711 Personal history of pulmonary embolism: Secondary | ICD-10-CM | POA: Diagnosis not present

## 2018-10-02 DIAGNOSIS — R52 Pain, unspecified: Secondary | ICD-10-CM | POA: Diagnosis not present

## 2018-10-02 DIAGNOSIS — T8459XD Infection and inflammatory reaction due to other internal joint prosthesis, subsequent encounter: Secondary | ICD-10-CM | POA: Diagnosis not present

## 2018-10-02 DIAGNOSIS — I10 Essential (primary) hypertension: Secondary | ICD-10-CM | POA: Diagnosis not present

## 2018-10-02 DIAGNOSIS — Z86718 Personal history of other venous thrombosis and embolism: Secondary | ICD-10-CM | POA: Diagnosis not present

## 2018-10-02 DIAGNOSIS — R079 Chest pain, unspecified: Secondary | ICD-10-CM | POA: Diagnosis not present

## 2018-10-02 DIAGNOSIS — M546 Pain in thoracic spine: Secondary | ICD-10-CM | POA: Diagnosis not present

## 2018-10-02 DIAGNOSIS — G8929 Other chronic pain: Secondary | ICD-10-CM | POA: Diagnosis not present

## 2018-10-02 DIAGNOSIS — K219 Gastro-esophageal reflux disease without esophagitis: Secondary | ICD-10-CM | POA: Diagnosis not present

## 2018-10-02 DIAGNOSIS — D46C Myelodysplastic syndrome with isolated del(5q) chromosomal abnormality: Secondary | ICD-10-CM | POA: Diagnosis not present

## 2018-10-02 DIAGNOSIS — N184 Chronic kidney disease, stage 4 (severe): Secondary | ICD-10-CM | POA: Diagnosis not present

## 2018-10-02 DIAGNOSIS — R109 Unspecified abdominal pain: Secondary | ICD-10-CM | POA: Diagnosis not present

## 2018-10-02 DIAGNOSIS — M5489 Other dorsalgia: Secondary | ICD-10-CM | POA: Diagnosis not present

## 2018-10-02 DIAGNOSIS — Z66 Do not resuscitate: Secondary | ICD-10-CM | POA: Diagnosis not present

## 2018-10-02 DIAGNOSIS — Z7901 Long term (current) use of anticoagulants: Secondary | ICD-10-CM | POA: Diagnosis not present

## 2018-10-02 DIAGNOSIS — Z96659 Presence of unspecified artificial knee joint: Secondary | ICD-10-CM | POA: Diagnosis not present

## 2018-10-02 DIAGNOSIS — M545 Low back pain: Secondary | ICD-10-CM | POA: Diagnosis not present

## 2018-10-02 DIAGNOSIS — R791 Abnormal coagulation profile: Secondary | ICD-10-CM | POA: Diagnosis not present

## 2018-10-02 DIAGNOSIS — G8911 Acute pain due to trauma: Secondary | ICD-10-CM | POA: Diagnosis not present

## 2018-10-03 DIAGNOSIS — I071 Rheumatic tricuspid insufficiency: Secondary | ICD-10-CM | POA: Diagnosis not present

## 2018-10-03 DIAGNOSIS — I480 Paroxysmal atrial fibrillation: Secondary | ICD-10-CM | POA: Diagnosis not present

## 2018-10-03 DIAGNOSIS — T8454XD Infection and inflammatory reaction due to internal left knee prosthesis, subsequent encounter: Secondary | ICD-10-CM | POA: Diagnosis not present

## 2018-10-03 DIAGNOSIS — M47812 Spondylosis without myelopathy or radiculopathy, cervical region: Secondary | ICD-10-CM | POA: Diagnosis present

## 2018-10-03 DIAGNOSIS — Z66 Do not resuscitate: Secondary | ICD-10-CM | POA: Diagnosis present

## 2018-10-03 DIAGNOSIS — R7889 Finding of other specified substances, not normally found in blood: Secondary | ICD-10-CM | POA: Diagnosis present

## 2018-10-03 DIAGNOSIS — I517 Cardiomegaly: Secondary | ICD-10-CM | POA: Diagnosis not present

## 2018-10-03 DIAGNOSIS — N184 Chronic kidney disease, stage 4 (severe): Secondary | ICD-10-CM | POA: Diagnosis not present

## 2018-10-03 DIAGNOSIS — R748 Abnormal levels of other serum enzymes: Secondary | ICD-10-CM | POA: Diagnosis not present

## 2018-10-03 DIAGNOSIS — Z91040 Latex allergy status: Secondary | ICD-10-CM | POA: Diagnosis not present

## 2018-10-03 DIAGNOSIS — J4520 Mild intermittent asthma, uncomplicated: Secondary | ICD-10-CM | POA: Diagnosis not present

## 2018-10-03 DIAGNOSIS — D46C Myelodysplastic syndrome with isolated del(5q) chromosomal abnormality: Secondary | ICD-10-CM | POA: Diagnosis not present

## 2018-10-03 DIAGNOSIS — Z86718 Personal history of other venous thrombosis and embolism: Secondary | ICD-10-CM | POA: Diagnosis not present

## 2018-10-03 DIAGNOSIS — R079 Chest pain, unspecified: Secondary | ICD-10-CM | POA: Diagnosis not present

## 2018-10-03 DIAGNOSIS — I129 Hypertensive chronic kidney disease with stage 1 through stage 4 chronic kidney disease, or unspecified chronic kidney disease: Secondary | ICD-10-CM | POA: Diagnosis not present

## 2018-10-03 DIAGNOSIS — Z888 Allergy status to other drugs, medicaments and biological substances status: Secondary | ICD-10-CM | POA: Diagnosis not present

## 2018-10-03 DIAGNOSIS — Z880 Allergy status to penicillin: Secondary | ICD-10-CM | POA: Diagnosis not present

## 2018-10-03 DIAGNOSIS — Z886 Allergy status to analgesic agent status: Secondary | ICD-10-CM | POA: Diagnosis not present

## 2018-10-03 DIAGNOSIS — Z885 Allergy status to narcotic agent status: Secondary | ICD-10-CM | POA: Diagnosis not present

## 2018-10-03 DIAGNOSIS — Z86711 Personal history of pulmonary embolism: Secondary | ICD-10-CM | POA: Diagnosis not present

## 2018-10-03 DIAGNOSIS — K219 Gastro-esophageal reflux disease without esophagitis: Secondary | ICD-10-CM | POA: Diagnosis not present

## 2018-10-03 DIAGNOSIS — M009 Pyogenic arthritis, unspecified: Secondary | ICD-10-CM | POA: Diagnosis present

## 2018-10-03 DIAGNOSIS — G8929 Other chronic pain: Secondary | ICD-10-CM | POA: Diagnosis present

## 2018-10-03 DIAGNOSIS — Z882 Allergy status to sulfonamides status: Secondary | ICD-10-CM | POA: Diagnosis not present

## 2018-10-03 DIAGNOSIS — Z7901 Long term (current) use of anticoagulants: Secondary | ICD-10-CM | POA: Diagnosis not present

## 2018-10-03 DIAGNOSIS — T8459XD Infection and inflammatory reaction due to other internal joint prosthesis, subsequent encounter: Secondary | ICD-10-CM | POA: Diagnosis not present

## 2018-10-03 DIAGNOSIS — R52 Pain, unspecified: Secondary | ICD-10-CM | POA: Diagnosis not present

## 2018-10-03 DIAGNOSIS — M50322 Other cervical disc degeneration at C5-C6 level: Secondary | ICD-10-CM | POA: Diagnosis present

## 2018-10-03 DIAGNOSIS — Z96659 Presence of unspecified artificial knee joint: Secondary | ICD-10-CM | POA: Diagnosis not present

## 2018-10-03 DIAGNOSIS — M549 Dorsalgia, unspecified: Secondary | ICD-10-CM | POA: Diagnosis present

## 2018-10-05 ENCOUNTER — Ambulatory Visit: Payer: Medicare Other | Admitting: Obstetrics & Gynecology

## 2018-10-05 DIAGNOSIS — T8450XD Infection and inflammatory reaction due to unspecified internal joint prosthesis, subsequent encounter: Secondary | ICD-10-CM | POA: Diagnosis not present

## 2018-10-05 DIAGNOSIS — T8459XD Infection and inflammatory reaction due to other internal joint prosthesis, subsequent encounter: Secondary | ICD-10-CM | POA: Diagnosis not present

## 2018-10-05 DIAGNOSIS — D46C Myelodysplastic syndrome with isolated del(5q) chromosomal abnormality: Secondary | ICD-10-CM | POA: Diagnosis not present

## 2018-10-05 DIAGNOSIS — M25561 Pain in right knee: Secondary | ICD-10-CM | POA: Diagnosis not present

## 2018-10-05 DIAGNOSIS — I129 Hypertensive chronic kidney disease with stage 1 through stage 4 chronic kidney disease, or unspecified chronic kidney disease: Secondary | ICD-10-CM | POA: Diagnosis not present

## 2018-10-05 DIAGNOSIS — Z7901 Long term (current) use of anticoagulants: Secondary | ICD-10-CM | POA: Diagnosis not present

## 2018-10-05 DIAGNOSIS — R279 Unspecified lack of coordination: Secondary | ICD-10-CM | POA: Diagnosis not present

## 2018-10-05 DIAGNOSIS — M19042 Primary osteoarthritis, left hand: Secondary | ICD-10-CM | POA: Diagnosis not present

## 2018-10-05 DIAGNOSIS — Z96659 Presence of unspecified artificial knee joint: Secondary | ICD-10-CM | POA: Diagnosis not present

## 2018-10-05 DIAGNOSIS — E870 Hyperosmolality and hypernatremia: Secondary | ICD-10-CM | POA: Diagnosis not present

## 2018-10-05 DIAGNOSIS — R52 Pain, unspecified: Secondary | ICD-10-CM | POA: Diagnosis not present

## 2018-10-05 DIAGNOSIS — R410 Disorientation, unspecified: Secondary | ICD-10-CM | POA: Diagnosis not present

## 2018-10-05 DIAGNOSIS — I480 Paroxysmal atrial fibrillation: Secondary | ICD-10-CM | POA: Diagnosis not present

## 2018-10-05 DIAGNOSIS — R5381 Other malaise: Secondary | ICD-10-CM | POA: Diagnosis not present

## 2018-10-05 DIAGNOSIS — N184 Chronic kidney disease, stage 4 (severe): Secondary | ICD-10-CM | POA: Diagnosis not present

## 2018-10-05 DIAGNOSIS — M109 Gout, unspecified: Secondary | ICD-10-CM | POA: Diagnosis not present

## 2018-10-05 DIAGNOSIS — K219 Gastro-esophageal reflux disease without esophagitis: Secondary | ICD-10-CM | POA: Diagnosis not present

## 2018-10-05 DIAGNOSIS — G8929 Other chronic pain: Secondary | ICD-10-CM | POA: Diagnosis not present

## 2018-10-05 DIAGNOSIS — R0902 Hypoxemia: Secondary | ICD-10-CM | POA: Diagnosis not present

## 2018-10-05 DIAGNOSIS — K529 Noninfective gastroenteritis and colitis, unspecified: Secondary | ICD-10-CM | POA: Diagnosis not present

## 2018-10-05 DIAGNOSIS — Z86711 Personal history of pulmonary embolism: Secondary | ICD-10-CM | POA: Diagnosis not present

## 2018-10-05 DIAGNOSIS — D469 Myelodysplastic syndrome, unspecified: Secondary | ICD-10-CM | POA: Diagnosis not present

## 2018-10-05 DIAGNOSIS — J4520 Mild intermittent asthma, uncomplicated: Secondary | ICD-10-CM | POA: Diagnosis not present

## 2018-10-05 DIAGNOSIS — Z86718 Personal history of other venous thrombosis and embolism: Secondary | ICD-10-CM | POA: Diagnosis not present

## 2018-10-05 DIAGNOSIS — T8454XA Infection and inflammatory reaction due to internal left knee prosthesis, initial encounter: Secondary | ICD-10-CM | POA: Diagnosis not present

## 2018-10-05 DIAGNOSIS — Z743 Need for continuous supervision: Secondary | ICD-10-CM | POA: Diagnosis not present

## 2018-10-05 DIAGNOSIS — I10 Essential (primary) hypertension: Secondary | ICD-10-CM | POA: Diagnosis not present

## 2018-10-05 DIAGNOSIS — R748 Abnormal levels of other serum enzymes: Secondary | ICD-10-CM | POA: Diagnosis not present

## 2018-10-05 DIAGNOSIS — M79601 Pain in right arm: Secondary | ICD-10-CM | POA: Diagnosis not present

## 2018-10-05 DIAGNOSIS — F3341 Major depressive disorder, recurrent, in partial remission: Secondary | ICD-10-CM | POA: Diagnosis not present

## 2018-10-05 DIAGNOSIS — M19041 Primary osteoarthritis, right hand: Secondary | ICD-10-CM | POA: Diagnosis not present

## 2018-10-06 MED ORDER — MORPHINE SULFATE (PF) 4 MG/ML IV SOLN
2.00 | INTRAVENOUS | Status: DC
Start: ? — End: 2018-10-06

## 2018-10-06 MED ORDER — CYCLOBENZAPRINE HCL 10 MG PO TABS
5.00 | ORAL_TABLET | ORAL | Status: DC
Start: ? — End: 2018-10-06

## 2018-10-06 MED ORDER — GENERIC EXTERNAL MEDICATION
10.00 | Status: DC
Start: ? — End: 2018-10-06

## 2018-10-06 MED ORDER — GENERIC EXTERNAL MEDICATION
2.50 | Status: DC
Start: ? — End: 2018-10-06

## 2018-10-06 MED ORDER — CLINDAMYCIN HCL 150 MG PO CAPS
300.00 | ORAL_CAPSULE | ORAL | Status: DC
Start: 2018-10-05 — End: 2018-10-06

## 2018-10-06 MED ORDER — GENERIC EXTERNAL MEDICATION
7.50 | Status: DC
Start: ? — End: 2018-10-06

## 2018-10-06 MED ORDER — LOSARTAN POTASSIUM 100 MG PO TABS
100.00 | ORAL_TABLET | ORAL | Status: DC
Start: 2018-10-06 — End: 2018-10-06

## 2018-10-06 MED ORDER — IOHEXOL 240 MG/ML SOLN
50.00 | INTRAMUSCULAR | Status: DC
Start: ? — End: 2018-10-06

## 2018-10-06 MED ORDER — GENERIC EXTERNAL MEDICATION
4.00 | Status: DC
Start: ? — End: 2018-10-06

## 2018-10-06 MED ORDER — PANTOPRAZOLE SODIUM 40 MG PO TBEC
40.00 | DELAYED_RELEASE_TABLET | ORAL | Status: DC
Start: 2018-10-06 — End: 2018-10-06

## 2018-10-06 MED ORDER — GENERIC EXTERNAL MEDICATION
650.00 | Status: DC
Start: ? — End: 2018-10-06

## 2018-10-06 MED ORDER — HYDROXYZINE HCL 25 MG PO TABS
25.00 | ORAL_TABLET | ORAL | Status: DC
Start: ? — End: 2018-10-06

## 2018-10-06 MED ORDER — ACETAMINOPHEN 325 MG PO TABS
650.00 | ORAL_TABLET | ORAL | Status: DC
Start: ? — End: 2018-10-06

## 2018-10-06 MED ORDER — ALUM & MAG HYDROXIDE-SIMETH 200-200-20 MG/5ML PO SUSP
30.00 | ORAL | Status: DC
Start: ? — End: 2018-10-06

## 2018-10-06 MED ORDER — HYDROCODONE-ACETAMINOPHEN 7.5-325 MG PO TABS
1.00 | ORAL_TABLET | ORAL | Status: DC
Start: ? — End: 2018-10-06

## 2018-10-06 MED ORDER — NITROGLYCERIN 0.4 MG SL SUBL
0.40 | SUBLINGUAL_TABLET | SUBLINGUAL | Status: DC
Start: ? — End: 2018-10-06

## 2018-10-06 MED ORDER — PNEUMOCOCCAL 13-VAL CONJ VACC IM SUSP
0.50 | INTRAMUSCULAR | Status: DC
Start: ? — End: 2018-10-06

## 2018-10-06 MED ORDER — SODIUM CHLORIDE 0.9 % IV SOLN
10.00 | INTRAVENOUS | Status: DC
Start: ? — End: 2018-10-06

## 2018-10-06 MED ORDER — WARFARIN SODIUM 5 MG PO TABS
5.00 | ORAL_TABLET | ORAL | Status: DC
Start: ? — End: 2018-10-06

## 2018-10-06 MED ORDER — CARVEDILOL 12.5 MG PO TABS
25.00 | ORAL_TABLET | ORAL | Status: DC
Start: 2018-10-05 — End: 2018-10-06

## 2018-10-06 MED ORDER — WARFARIN SODIUM 1 MG PO TABS
ORAL_TABLET | ORAL | Status: DC
Start: ? — End: 2018-10-06

## 2018-10-23 ENCOUNTER — Ambulatory Visit: Payer: Medicare Other

## 2018-10-23 ENCOUNTER — Other Ambulatory Visit: Payer: Self-pay | Admitting: Osteopathic Medicine

## 2018-10-23 ENCOUNTER — Ambulatory Visit: Payer: Medicare Other | Admitting: Cardiology

## 2018-10-27 ENCOUNTER — Other Ambulatory Visit: Payer: Self-pay

## 2018-10-27 DIAGNOSIS — M86052 Acute hematogenous osteomyelitis, left femur: Secondary | ICD-10-CM | POA: Diagnosis not present

## 2018-10-27 DIAGNOSIS — N183 Chronic kidney disease, stage 3 (moderate): Secondary | ICD-10-CM | POA: Diagnosis not present

## 2018-10-27 DIAGNOSIS — I480 Paroxysmal atrial fibrillation: Secondary | ICD-10-CM | POA: Diagnosis not present

## 2018-10-27 DIAGNOSIS — Z79891 Long term (current) use of opiate analgesic: Secondary | ICD-10-CM | POA: Diagnosis not present

## 2018-10-27 DIAGNOSIS — D469 Myelodysplastic syndrome, unspecified: Secondary | ICD-10-CM | POA: Diagnosis not present

## 2018-10-27 DIAGNOSIS — M1991 Primary osteoarthritis, unspecified site: Secondary | ICD-10-CM | POA: Diagnosis not present

## 2018-10-27 DIAGNOSIS — G8929 Other chronic pain: Secondary | ICD-10-CM | POA: Diagnosis not present

## 2018-10-27 DIAGNOSIS — I129 Hypertensive chronic kidney disease with stage 1 through stage 4 chronic kidney disease, or unspecified chronic kidney disease: Secondary | ICD-10-CM | POA: Diagnosis not present

## 2018-10-27 DIAGNOSIS — G729 Myopathy, unspecified: Secondary | ICD-10-CM | POA: Diagnosis not present

## 2018-10-27 DIAGNOSIS — Z95820 Peripheral vascular angioplasty status with implants and grafts: Secondary | ICD-10-CM | POA: Diagnosis not present

## 2018-10-27 DIAGNOSIS — Z7901 Long term (current) use of anticoagulants: Secondary | ICD-10-CM | POA: Diagnosis not present

## 2018-10-27 DIAGNOSIS — Z792 Long term (current) use of antibiotics: Secondary | ICD-10-CM | POA: Diagnosis not present

## 2018-10-27 DIAGNOSIS — Z86718 Personal history of other venous thrombosis and embolism: Secondary | ICD-10-CM | POA: Diagnosis not present

## 2018-10-27 DIAGNOSIS — M109 Gout, unspecified: Secondary | ICD-10-CM | POA: Diagnosis not present

## 2018-10-27 DIAGNOSIS — M150 Primary generalized (osteo)arthritis: Secondary | ICD-10-CM | POA: Diagnosis not present

## 2018-10-27 DIAGNOSIS — Z85831 Personal history of malignant neoplasm of soft tissue: Secondary | ICD-10-CM | POA: Diagnosis not present

## 2018-10-27 DIAGNOSIS — G8928 Other chronic postprocedural pain: Secondary | ICD-10-CM

## 2018-10-27 DIAGNOSIS — Z5181 Encounter for therapeutic drug level monitoring: Secondary | ICD-10-CM | POA: Diagnosis not present

## 2018-10-27 MED ORDER — HYDROCODONE-ACETAMINOPHEN 7.5-325 MG PO TABS: 1.0000 | ORAL_TABLET | Freq: Four times a day (QID) | ORAL | 0 refills | Status: DC | PRN

## 2018-10-27 NOTE — Telephone Encounter (Signed)
Gloria Mckay requests a refill on Hydrocodone.

## 2018-10-30 ENCOUNTER — Ambulatory Visit (INDEPENDENT_AMBULATORY_CARE_PROVIDER_SITE_OTHER): Payer: Medicare Other | Admitting: Osteopathic Medicine

## 2018-10-30 ENCOUNTER — Encounter: Payer: Self-pay | Admitting: Osteopathic Medicine

## 2018-10-30 VITALS — BP 166/82 | HR 50 | Temp 98.0°F | Wt 111.9 lb

## 2018-10-30 DIAGNOSIS — Z7901 Long term (current) use of anticoagulants: Secondary | ICD-10-CM

## 2018-10-30 DIAGNOSIS — E559 Vitamin D deficiency, unspecified: Secondary | ICD-10-CM

## 2018-10-30 DIAGNOSIS — G8928 Other chronic postprocedural pain: Secondary | ICD-10-CM

## 2018-10-30 DIAGNOSIS — N183 Chronic kidney disease, stage 3 unspecified: Secondary | ICD-10-CM

## 2018-10-30 DIAGNOSIS — M5136 Other intervertebral disc degeneration, lumbar region: Secondary | ICD-10-CM

## 2018-10-30 DIAGNOSIS — D649 Anemia, unspecified: Secondary | ICD-10-CM | POA: Diagnosis not present

## 2018-10-30 DIAGNOSIS — R001 Bradycardia, unspecified: Secondary | ICD-10-CM

## 2018-10-30 DIAGNOSIS — I480 Paroxysmal atrial fibrillation: Secondary | ICD-10-CM

## 2018-10-30 DIAGNOSIS — I10 Essential (primary) hypertension: Secondary | ICD-10-CM | POA: Diagnosis not present

## 2018-10-30 LAB — POCT INR: INR: 2.8 (ref 2.0–3.0)

## 2018-10-30 MED ORDER — HYDROCODONE-ACETAMINOPHEN 7.5-325 MG PO TABS: 1.0000 | ORAL_TABLET | Freq: Four times a day (QID) | ORAL | 0 refills | Status: DC | PRN

## 2018-10-30 MED ORDER — VITAMIN D (ERGOCALCIFEROL) 1.25 MG (50000 UNIT) PO CAPS: 50000.0000 [IU] | ORAL_CAPSULE | ORAL | 0 refills | Status: DC

## 2018-10-30 NOTE — Patient Instructions (Addendum)
   Pain medication contract updated today  Will get labs and restart BP medications based on results  Have home health check blood pressure and INR - they can fax me an order if needed   Keep appointments as directed with all specialists  OK to take the pain medicine every 6 hours as needed - if you are finding you  need it more, please come talk to me about this before increasing dose!

## 2018-10-30 NOTE — Progress Notes (Signed)
HPI: Gloria Mckay is a 78 y.o. female who  has a past medical history of Arthritis, Cancer ([place]), Cataract, Hypertension, and Myelodysplasia (myelodysplastic syndrome) ([place]).  she presents to [facility] today, 10/30/18,  for chief complaint of: Hospital/Rehab follow-up, pain management    Feeling better since out of the hospital/rehab Since stopping the chemo has noted great improvement in pain and energy levels, has f/u in place w/ HemOnc  No dizziness or lightheadedness. PT / nursing coming to the house and she is participating in therapy.   Concerned about some lab abnormalities, anemia in particular. No bleeding.   Pain well controlled, pt not really wanting to change her medications or feeling need to increase dosage/frequency.   BP meds hydralazine and losartan were held on discharge, BP up today, no HA/VC, no CP/SOB  Records reviewed:   Hospitalized 10/02/2018 to 10/05/2018 fall at home.  Family noted progressive inability to ambulate due to severe pain, patient slid out of bed and was struck between the bed and the wall, was brought to hospital by EMS and admitted after r/o fracture, treated for pain, d/c to inpatient rehab for ongoing PT.   Labs as of 10/05/2018: Hemoglobin 8.6, question dilution effect as it was 11.2 on admission, last INR 10/22/2018 was 1.7, was 5.5 on 10/07/2018, creatinine has been stable, hypocalcemia - seem to be trending downward (corrected based on albumin on intake to hospital, still low), A1C was 4.7, CK trended down by discharge     Patient is accompanied by granddaughter who assists with history-taking.     Past medical, surgical, social and family history reviewed:  Patient Active Problem List   Diagnosis Date Noted  . Accidental fall 08/11/2018  . Headache 06/16/2018  . Atrial tachycardia ([place]) 05/08/2018  . Pain in right hip 03/21/2018  . Vertebral compression fracture ([place]) 03/21/2018  . Anemia of chronic  disease 11/08/2017  . Bronchiectasis with acute lower respiratory infection ([place]) 10/28/2017  . Vocal tremor 10/13/2017  . Chronic infection of prosthetic knee, subsequent encounter 09/06/2017  . Biclonal gammopathy 08/12/2017  . 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 Pseudomonas infection 04/25/2017  . Long term (current) use of anticoagulants [Z79.01] 02/24/2017  . Primary hypercoagulable state ([place]) [D68.59] 02/24/2017  . Atrial fibrillation ([place]) [I48.91] 02/24/2017  . DDD (degenerative disc disease), cervical 02/11/2017  . Elevated MCV 01/11/2017  . History of partial gastrectomy 01/11/2017  . Benign hypertension with CKD (chronic kidney disease) stage III ([place]) 10/28/2016  . History of arthroplasty of left knee 10/25/2016  . Plantar fasciitis, left 10/22/2016  . Fibroma of tongue 06/14/2016  . Postmenopausal osteoporosis 02/06/2016  . History of pulmonary embolism 01/30/2016  . CKD (chronic kidney disease) stage 3, GFR 30-59 ml/min (HCC) 08/19/2015  . Dry eye syndrome of both lacrimal glands 08/19/2015  . Status post cataract extraction and insertion of intraocular lens of left eye 08/19/2015  . Chronic hypernatremia 08/19/2015  . Cortical age-related cataract of right eye 08/19/2015  . Nuclear sclerotic cataract of right eye 08/19/2015  . CKD stage G3b/A1, GFR 30-44 and albumin creatinine ratio <30 mg/g (HCC) 08/19/2015  . Iron deficiency anemia due to chronic blood loss 02/21/2015  . AMD (age-related macular degeneration), bilateral 02/05/2015  . Congenital hypertrophy of retinal pigment epithelium 02/05/2015  . Endothelial corneal dystrophy 02/05/2015  . Cornea guttata 02/05/2015  . Lumbar spinal stenosis 05/09/2014  . Chronic atrial fibrillation 02/26/2014  . S/P colonoscopy 01/23/2014  . S/P endoscopy 01/23/2014  . Gastric  bezoar 08/06/2013  . History of MRSA infection 12/30/2012  . Long term current use of anticoagulant therapy 08/23/2011  . Mild intermittent asthma  without complication 33/82/5053  . Chest pain 01/15/2010  . Esophageal reflux 11/05/2009  . Gastroesophageal reflux disease without esophagitis 11/05/2009  . DDD (degenerative disc disease), lumbar 09/19/2009  . Infection and inflammatory reaction due to internal joint prosthesis ([place]) 08/08/2009  . Vitamin D deficiency 05/12/2009  . Chronic nausea 02/12/2009  . History of DVT of lower extremity 02/12/2009  . Recurrent major depressive disorder, in partial remission ([place]) 02/12/2009    Past Surgical History:  Procedure Laterality Date  . JOINT REPLACEMENT    . STOMACH SURGERY      Social History   Tobacco Use  . Smoking status: Never Smoker  . Smokeless tobacco: Never Used  Substance Use Topics  . Alcohol use: No    Family History  Problem Relation Age of Onset  . Hypertension Mother   . Pulmonary embolism Mother   . Heart disease Father      Current medication list and allergy/intolerance information reviewed:    Current Outpatient Medications  Medication Sig Dispense Refill  . amLODipine (NORVASC) 10 MG tablet Take 1 tablet (10 mg total) by mouth daily. 90 tablet 3  . Calcium Carbonate-Vitamin D 600-400 MG-UNIT chew tablet Chew 2 tablets by mouth daily. 60 tablet 12  . carvedilol (COREG) 25 MG tablet Take 1 tablet (25 mg total) by mouth 2 (two) times daily with a meal. 180 tablet 0  . Cholecalciferol (VITAMIN D3) 50000 units CAPS Take 1 tablet by mouth daily.  1  . clindamycin (CLEOCIN) 300 MG capsule TAKE 1 CAPSULE BY MOUTH TWICE A DAY    . denosumab (PROLIA) 60 MG/ML SOSY injection Inject 60 mg into the skin every 6 (six) months. Administer in upper arm, thigh, or abdomen 1 Syringe 1  . HYDROcodone-acetaminophen (NORCO) 7.5-325 MG tablet Take 1 tablet by mouth every 6 (six) hours as needed for up to 30 days for moderate pain or severe pain. Use sparingly. #90 for 30(thirty) days 60 tablet 0  . hydrOXYzine (ATARAX/VISTARIL) 10 MG tablet Take 1-2 tablets (10-20 mg  total) by mouth 3 (three) times daily as needed for itching. 90 tablet 0  . mometasone (ELOCON) 0.1 % cream Apply 1 application topically daily.    . mupirocin ointment (BACTROBAN) 2 % Place 1 application into the nose 2 (two) times daily.    . Omega-3 Fatty Acids ([employer] FISH OIL PO) Take by mouth.    . Prasterone (INTRAROSA) 6.5 MG INST Place 1 suppository vaginally at bedtime. 30 each 12  . topiramate (TOPAMAX) 100 MG tablet Take 100 mg by mouth 2 (two) times daily.    Marland Kitchen warfarin (COUMADIN) 5 MG tablet Take 5 mg by mouth daily.    Marland Kitchen warfarin (COUMADIN) 7.5 MG tablet Take 1 tablet (7.5 mg total) by mouth daily. 30 tablet 1  . enoxaparin (LOVENOX) 30 MG/0.3ML injection Inject 0.3 mLs (30 mg total) into the skin daily for 7 days. 10 Syringe 0  . hydrALAZINE (APRESOLINE) 25 MG tablet Take 1 tablet (25 mg total) by mouth 2 (two) times daily. (Patient not taking: Reported on 10/30/2018) 180 tablet 3  . losartan (COZAAR) 100 MG tablet Take 1 tablet (100 mg total) by mouth daily. (Patient not taking: Reported on 10/30/2018) 90 tablet 0  . Vitamin D, Ergocalciferol, (DRISDOL) 1.25 MG (50000 UT) CAPS capsule Take 1 capsule (50,000 Units total) by mouth every  7 (seven) days. Take for 8 total doses(weeks) 8 capsule 0   No current facility-administered medications for this visit.     Allergies  Allergen Reactions  . Cefuroxime Anaphylaxis    Difficulty swallowing pills because they were too dry.  Caused tablet dysphagia.  . Diazepam Shortness Of Breath    HYPERVENTILATION     . Diphenhydramine Palpitations    Affected heart rate/er told her not to take again  . Latex Rash and Shortness Of Breath    Airway swelling  . Other Palpitations    Affected heart rate/er told her not to take again  . Amlodipine Cough and Other (See Comments)    Not sure  . Butorphanol Hives  . Diclofenac Hives  . Diclofenac Sodium Hives  . Diphenhydramine Hcl Palpitations    Affected heart rate/er told her not to take again   . Methylpyrrolidone Hives  . Ondansetron Hives and Other (See Comments)    Sedates/knocks her out    . Oxycodone Hives  . Penicillins Hives and Swelling    Most mycin drugs  . Quinolones Other (See Comments)    Joint pain  . Sulfa Antibiotics Hives and Swelling  . Butorphanol Tartrate Hives  . Lisinopril Cough    Caused pt to cough  . Ramipril Cough    Pt c/o cough  . Ace Inhibitors Other (See Comments) and Cough    Lisinopril and ramipril   . Ciprofloxacin Rash    Patient prefers not to take Fluoroquinolones   . Codeine Nausea Only  . Pantoprazole Sodium Diarrhea      Review of Systems:  Constitutional:  No  fever, no chills, +recent illness, No unintentional weight changes. +significant fatigue.   HEENT: No  headache, no vision change  Cardiac: No  chest pain, No  pressure, No palpitations, No  Orthopnea  Respiratory:  No  shortness of breath. No  Cough  Gastrointestinal: No  abdominal pain, No  nausean   Musculoskeletal: No new myalgia/arthralgia  Skin: No  Rash  Genitourinary: No  abnormal genital bleeding or hematuria  Hem/Onc: No  easy bruising/bleeding  Neurologic: No  weakness, No  dizziness  Psychiatric: No  concerns with depression, No  concerns with anxiety, reports "situational" answers to PHQ/GAD  Exam:  BP (!) 166/82 (BP Location: Left Arm, Patient Position: Sitting, Cuff Size: Normal)   Pulse (!) 50   Temp 98 F (36.7 C) (Oral)   Wt 111 lb 14.4 oz (50.8 kg)   SpO2 97%   BMI 19.82 kg/m   Constitutional: VS see above. General Appearance: alert, pale, NAD  Eyes: Normal lids and conjunctive, non-icteric sclera  Ears, Nose, Mouth, Throat: MMM, Normal external inspection ears/nares/mouth/lips/gums.   Neck: No masses, trachea midline. No thyroid enlargement. No tenderness/mass appreciated. No lymphadenopathy  Respiratory: Normal respiratory effort. no wheeze, no rhonchi, no rales  Cardiovascular: S1/S2 normal, +murmur, no rub/gallop  auscultated. Reg rhythm, rate around 55 bmp. Trace lower extremity edema.   Gastrointestinal: Nontender, no masses. No hepatomegaly, no splenomegaly. No hernia appreciated. Bowel sounds normal. Rectal exam deferred.   Neurological: Normal balance/coordination. No tremor.   Skin: warm, dry, intact. No rash/ulcer.   Psychiatric: Normal judgment/insight. Normal mood and affect. Oriented x3.    Results for orders placed or performed in visit on 10/30/18 (from the past 72 hour(s))  POCT INR     Status: None   Collection Time: 10/30/18 11:05 AM  Result Value Ref Range   INR 2.8 2.0 - 3.0  ASSESSMENT/PLAN: The primary encounter diagnosis was Paroxysmal atrial fibrillation ([place]). Diagnoses of Long term (current) use of anticoagulants, Anemia, unspecified type, Hypocalcemia, Other chronic postprocedural pain, DDD (degenerative disc disease), lumbar, CKD (chronic kidney disease) stage 3, GFR 30-59 ml/min (HCC), Essential hypertension, Bradycardia, Vitamin D deficiency, and Other chronic postprocedural pain were also pertinent to this visit.   Orders Placed This Encounter  Procedures  . CBC  . COMPLETE METABOLIC PANEL WITH GFR  . Reticulocytes  . Fe+TIBC+Fer  . Magnesium  . VITAMIN D 25 Hydroxy (Vit-D Deficiency, Fractures)  . POCT INR    Meds ordered this encounter  Medications  . Vitamin D, Ergocalciferol, (DRISDOL) 1.25 MG (50000 UT) CAPS capsule    Sig: Take 1 capsule (50,000 Units total) by mouth every 7 (seven) days. Take for 8 total doses(weeks)    Dispense:  8 capsule    Refill:  0  . HYDROcodone-acetaminophen (NORCO) 7.5-325 MG tablet    Sig: Take 1 tablet by mouth every 6 (six) hours as needed for up to 30 days for moderate pain or severe pain. Use sparingly. #90 for 30(thirty) days    Dispense:  60 tablet    Refill:  0    Patient Instructions   Pain medication contract updated today  Will get labs and restart BP medications based on results  Have home  health check blood pressure and INR - they can fax me an order if needed   Keep appointments as directed with all specialists  OK to take the pain medicine every 6 hours as needed - if you are finding you  need it more, please come talk to me about this before increasing dose!           Visit summary with medication list and pertinent instructions was printed for patient to review. All questions at time of visit were answered - patient instructed to contact office with any additional concerns or updates. ER/RTC precautions were reviewed with the patient.   Note: Total time spent 40 minutes, greater than 50% of the visit was spent face-to-face counseling and coordinating care for the above diagnoses listed in assessment/plan.   Please note: voice recognition software was used to produce this document, and typos may escape review. Please contact Dr. Sheppard Coil for any needed clarifications.     Follow-up plan: Return in about 3 months (around 01/30/2019) for recheck chronic medical problems, sooner if needed / based on labs.

## 2018-10-31 LAB — COMPLETE METABOLIC PANEL WITH GFR
AG Ratio: 1.1 (calc) (ref 1.0–2.5)
ALT: 10 U/L (ref 6–29)
AST: 15 U/L (ref 10–35)
Albumin: 3.3 g/dL — ABNORMAL LOW (ref 3.6–5.1)
Alkaline phosphatase (APISO): 94 U/L (ref 37–153)
BUN/Creatinine Ratio: 17 (calc) (ref 6–22)
BUN: 24 mg/dL (ref 7–25)
CO2: 24 mmol/L (ref 20–32)
Calcium: 8.8 mg/dL (ref 8.6–10.4)
Chloride: 110 mmol/L (ref 98–110)
Creat: 1.43 mg/dL — ABNORMAL HIGH (ref 0.60–0.93)
GFR, Est African American: 41 mL/min/{1.73_m2} — ABNORMAL LOW (ref >=60)
GFR, Est Non African American: 35 mL/min/{1.73_m2} — ABNORMAL LOW (ref >=60)
Globulin: 2.9 g/dL (calc) (ref 1.9–3.7)
Glucose, Bld: 89 mg/dL (ref 65–99)
Potassium: 4.6 mmol/L (ref 3.5–5.3)
SODIUM: 142 mmol/L (ref 135–146)
Total Bilirubin: 0.3 mg/dL (ref 0.2–1.2)
Total Protein: 6.2 g/dL (ref 6.1–8.1)

## 2018-10-31 LAB — IRON,TIBC AND FERRITIN PANEL
%SAT: 16 % (calc) (ref 16–45)
Ferritin: 109 ng/mL (ref 16–288)
Iron: 40 ug/dL — ABNORMAL LOW (ref 45–160)
TIBC: 248 mcg/dL (calc) — ABNORMAL LOW (ref 250–450)

## 2018-10-31 LAB — CBC
HCT: 29.8 % — ABNORMAL LOW (ref 35.0–45.0)
Hemoglobin: 9.3 g/dL — ABNORMAL LOW (ref 11.7–15.5)
MCH: 28.8 pg (ref 27.0–33.0)
MCHC: 31.2 g/dL — ABNORMAL LOW (ref 32.0–36.0)
MCV: 92.3 fL (ref 80.0–100.0)
MPV: 9.7 fL (ref 7.5–12.5)
Platelets: 359 10*3/uL (ref 140–400)
RBC: 3.23 10*6/uL — ABNORMAL LOW (ref 3.80–5.10)
RDW: 14.8 % (ref 11.0–15.0)
WBC: 5.1 10*3/uL (ref 3.8–10.8)

## 2018-10-31 LAB — RETICULOCYTES
ABS Retic: 87210 cells/uL — ABNORMAL HIGH (ref 20000–8000)
Retic Ct Pct: 2.7 %

## 2018-10-31 LAB — MAGNESIUM: Magnesium: 1.7 mg/dL (ref 1.5–2.5)

## 2018-10-31 LAB — VITAMIN D 25 HYDROXY (VIT D DEFICIENCY, FRACTURES): Vit D, 25-Hydroxy: 63 ng/mL (ref 30–100)

## 2018-11-01 ENCOUNTER — Telehealth: Payer: Self-pay | Admitting: Osteopathic Medicine

## 2018-11-01 DIAGNOSIS — M109 Gout, unspecified: Secondary | ICD-10-CM | POA: Diagnosis not present

## 2018-11-01 DIAGNOSIS — I480 Paroxysmal atrial fibrillation: Secondary | ICD-10-CM | POA: Diagnosis not present

## 2018-11-01 DIAGNOSIS — D469 Myelodysplastic syndrome, unspecified: Secondary | ICD-10-CM | POA: Diagnosis not present

## 2018-11-01 DIAGNOSIS — G8929 Other chronic pain: Secondary | ICD-10-CM | POA: Diagnosis not present

## 2018-11-01 DIAGNOSIS — M86052 Acute hematogenous osteomyelitis, left femur: Secondary | ICD-10-CM | POA: Diagnosis not present

## 2018-11-01 DIAGNOSIS — M150 Primary generalized (osteo)arthritis: Secondary | ICD-10-CM | POA: Diagnosis not present

## 2018-11-01 NOTE — Telephone Encounter (Signed)
2 weeks form last INR is ok to check

## 2018-11-01 NOTE — Telephone Encounter (Signed)
[place] from [facility] needs HH orders to recheck INR. Can provide a verbal. Routing to PCP to see when INR needs to be rechecked.

## 2018-11-02 NOTE — Telephone Encounter (Signed)
[place] advised of verbal order.

## 2018-11-06 DIAGNOSIS — M86052 Acute hematogenous osteomyelitis, left femur: Secondary | ICD-10-CM | POA: Diagnosis not present

## 2018-11-06 DIAGNOSIS — G8929 Other chronic pain: Secondary | ICD-10-CM | POA: Diagnosis not present

## 2018-11-06 DIAGNOSIS — I480 Paroxysmal atrial fibrillation: Secondary | ICD-10-CM | POA: Diagnosis not present

## 2018-11-06 DIAGNOSIS — D469 Myelodysplastic syndrome, unspecified: Secondary | ICD-10-CM | POA: Diagnosis not present

## 2018-11-06 DIAGNOSIS — M150 Primary generalized (osteo)arthritis: Secondary | ICD-10-CM | POA: Diagnosis not present

## 2018-11-06 DIAGNOSIS — M109 Gout, unspecified: Secondary | ICD-10-CM | POA: Diagnosis not present

## 2018-11-07 ENCOUNTER — Telehealth: Payer: Self-pay

## 2018-11-07 DIAGNOSIS — M150 Primary generalized (osteo)arthritis: Secondary | ICD-10-CM | POA: Diagnosis not present

## 2018-11-07 DIAGNOSIS — D469 Myelodysplastic syndrome, unspecified: Secondary | ICD-10-CM | POA: Diagnosis not present

## 2018-11-07 DIAGNOSIS — M109 Gout, unspecified: Secondary | ICD-10-CM | POA: Diagnosis not present

## 2018-11-07 DIAGNOSIS — I480 Paroxysmal atrial fibrillation: Secondary | ICD-10-CM | POA: Diagnosis not present

## 2018-11-07 DIAGNOSIS — M86052 Acute hematogenous osteomyelitis, left femur: Secondary | ICD-10-CM | POA: Diagnosis not present

## 2018-11-07 DIAGNOSIS — G8929 Other chronic pain: Secondary | ICD-10-CM | POA: Diagnosis not present

## 2018-11-07 NOTE — Telephone Encounter (Signed)
[facility]) called requesting a verbal order. She requests for physical therapy to be for 2 times for 2 wks, then 1 time for 1 wk. Returned a call back, no answer. Left a verbal ok to proceed with physical therapy.

## 2018-11-08 DIAGNOSIS — G8929 Other chronic pain: Secondary | ICD-10-CM | POA: Diagnosis not present

## 2018-11-08 DIAGNOSIS — I10 Essential (primary) hypertension: Secondary | ICD-10-CM | POA: Diagnosis not present

## 2018-11-08 DIAGNOSIS — I480 Paroxysmal atrial fibrillation: Secondary | ICD-10-CM | POA: Diagnosis not present

## 2018-11-08 DIAGNOSIS — D469 Myelodysplastic syndrome, unspecified: Secondary | ICD-10-CM | POA: Diagnosis not present

## 2018-11-08 DIAGNOSIS — M150 Primary generalized (osteo)arthritis: Secondary | ICD-10-CM | POA: Diagnosis not present

## 2018-11-08 DIAGNOSIS — M109 Gout, unspecified: Secondary | ICD-10-CM | POA: Diagnosis not present

## 2018-11-08 DIAGNOSIS — G43819 Other migraine, intractable, without status migrainosus: Secondary | ICD-10-CM | POA: Diagnosis not present

## 2018-11-08 DIAGNOSIS — M86052 Acute hematogenous osteomyelitis, left femur: Secondary | ICD-10-CM | POA: Diagnosis not present

## 2018-11-09 DIAGNOSIS — M109 Gout, unspecified: Secondary | ICD-10-CM | POA: Diagnosis not present

## 2018-11-09 DIAGNOSIS — M150 Primary generalized (osteo)arthritis: Secondary | ICD-10-CM | POA: Diagnosis not present

## 2018-11-09 DIAGNOSIS — M86052 Acute hematogenous osteomyelitis, left femur: Secondary | ICD-10-CM | POA: Diagnosis not present

## 2018-11-09 DIAGNOSIS — D469 Myelodysplastic syndrome, unspecified: Secondary | ICD-10-CM | POA: Diagnosis not present

## 2018-11-09 DIAGNOSIS — G8929 Other chronic pain: Secondary | ICD-10-CM | POA: Diagnosis not present

## 2018-11-09 DIAGNOSIS — I480 Paroxysmal atrial fibrillation: Secondary | ICD-10-CM | POA: Diagnosis not present

## 2018-11-10 DIAGNOSIS — M86052 Acute hematogenous osteomyelitis, left femur: Secondary | ICD-10-CM | POA: Diagnosis not present

## 2018-11-10 DIAGNOSIS — M150 Primary generalized (osteo)arthritis: Secondary | ICD-10-CM | POA: Diagnosis not present

## 2018-11-10 DIAGNOSIS — D469 Myelodysplastic syndrome, unspecified: Secondary | ICD-10-CM | POA: Diagnosis not present

## 2018-11-10 DIAGNOSIS — G8929 Other chronic pain: Secondary | ICD-10-CM | POA: Diagnosis not present

## 2018-11-10 DIAGNOSIS — I480 Paroxysmal atrial fibrillation: Secondary | ICD-10-CM | POA: Diagnosis not present

## 2018-11-10 DIAGNOSIS — M109 Gout, unspecified: Secondary | ICD-10-CM | POA: Diagnosis not present

## 2018-11-13 ENCOUNTER — Telehealth: Payer: Self-pay

## 2018-11-13 DIAGNOSIS — N189 Chronic kidney disease, unspecified: Secondary | ICD-10-CM | POA: Diagnosis not present

## 2018-11-13 DIAGNOSIS — D469 Myelodysplastic syndrome, unspecified: Secondary | ICD-10-CM | POA: Diagnosis not present

## 2018-11-13 DIAGNOSIS — M86052 Acute hematogenous osteomyelitis, left femur: Secondary | ICD-10-CM | POA: Diagnosis not present

## 2018-11-13 DIAGNOSIS — I482 Chronic atrial fibrillation, unspecified: Secondary | ICD-10-CM | POA: Diagnosis not present

## 2018-11-13 DIAGNOSIS — D472 Monoclonal gammopathy: Secondary | ICD-10-CM | POA: Diagnosis not present

## 2018-11-13 DIAGNOSIS — I480 Paroxysmal atrial fibrillation: Secondary | ICD-10-CM | POA: Diagnosis not present

## 2018-11-13 DIAGNOSIS — Z86711 Personal history of pulmonary embolism: Secondary | ICD-10-CM | POA: Diagnosis not present

## 2018-11-13 DIAGNOSIS — G8929 Other chronic pain: Secondary | ICD-10-CM | POA: Diagnosis not present

## 2018-11-13 DIAGNOSIS — D46C Myelodysplastic syndrome with isolated del(5q) chromosomal abnormality: Secondary | ICD-10-CM | POA: Diagnosis not present

## 2018-11-13 DIAGNOSIS — Z96652 Presence of left artificial knee joint: Secondary | ICD-10-CM | POA: Diagnosis not present

## 2018-11-13 DIAGNOSIS — I509 Heart failure, unspecified: Secondary | ICD-10-CM | POA: Diagnosis not present

## 2018-11-13 DIAGNOSIS — I130 Hypertensive heart and chronic kidney disease with heart failure and stage 1 through stage 4 chronic kidney disease, or unspecified chronic kidney disease: Secondary | ICD-10-CM | POA: Diagnosis not present

## 2018-11-13 DIAGNOSIS — D890 Polyclonal hypergammaglobulinemia: Secondary | ICD-10-CM | POA: Diagnosis not present

## 2018-11-13 DIAGNOSIS — M109 Gout, unspecified: Secondary | ICD-10-CM | POA: Diagnosis not present

## 2018-11-13 DIAGNOSIS — M150 Primary generalized (osteo)arthritis: Secondary | ICD-10-CM | POA: Diagnosis not present

## 2018-11-13 DIAGNOSIS — Z86718 Personal history of other venous thrombosis and embolism: Secondary | ICD-10-CM | POA: Diagnosis not present

## 2018-11-13 DIAGNOSIS — Z7901 Long term (current) use of anticoagulants: Secondary | ICD-10-CM | POA: Diagnosis not present

## 2018-11-13 NOTE — Telephone Encounter (Signed)
T[place]ania from [facility] called - wanted provider to be aware patient's at home testing. PT was 35.5 and INR was 3.1. Wants to know if provider want patient to continue with current dose or have it changed? Also, when should pt have their next INR check? Pls advise, thanks. [place] can be reached at [phone] with updates.

## 2018-11-14 NOTE — Telephone Encounter (Signed)
Since only slightly out of range, can recheck INR in this tomorrow ([place], 11/15/18) and keep dose same for now

## 2018-11-14 NOTE — Telephone Encounter (Signed)
Spoke to [place] - has been updated of provider's note. No other inquiries during call.

## 2018-11-15 ENCOUNTER — Telehealth: Payer: Self-pay

## 2018-11-15 DIAGNOSIS — D469 Myelodysplastic syndrome, unspecified: Secondary | ICD-10-CM | POA: Diagnosis not present

## 2018-11-15 DIAGNOSIS — M86052 Acute hematogenous osteomyelitis, left femur: Secondary | ICD-10-CM | POA: Diagnosis not present

## 2018-11-15 DIAGNOSIS — G8929 Other chronic pain: Secondary | ICD-10-CM | POA: Diagnosis not present

## 2018-11-15 DIAGNOSIS — M109 Gout, unspecified: Secondary | ICD-10-CM | POA: Diagnosis not present

## 2018-11-15 DIAGNOSIS — M150 Primary generalized (osteo)arthritis: Secondary | ICD-10-CM | POA: Diagnosis not present

## 2018-11-15 DIAGNOSIS — I480 Paroxysmal atrial fibrillation: Secondary | ICD-10-CM | POA: Diagnosis not present

## 2018-11-15 NOTE — Telephone Encounter (Signed)
Daniel from [facility] called and reports INR and PT results.   INR 2.6 PT 3.6

## 2018-11-15 NOTE — Telephone Encounter (Signed)
OK can recheck INR in a week

## 2018-11-15 NOTE — Telephone Encounter (Signed)
Daniel advised.

## 2018-11-16 DIAGNOSIS — M150 Primary generalized (osteo)arthritis: Secondary | ICD-10-CM | POA: Diagnosis not present

## 2018-11-16 DIAGNOSIS — M86052 Acute hematogenous osteomyelitis, left femur: Secondary | ICD-10-CM | POA: Diagnosis not present

## 2018-11-16 DIAGNOSIS — M109 Gout, unspecified: Secondary | ICD-10-CM | POA: Diagnosis not present

## 2018-11-16 DIAGNOSIS — I480 Paroxysmal atrial fibrillation: Secondary | ICD-10-CM | POA: Diagnosis not present

## 2018-11-16 DIAGNOSIS — G8929 Other chronic pain: Secondary | ICD-10-CM | POA: Diagnosis not present

## 2018-11-16 DIAGNOSIS — D469 Myelodysplastic syndrome, unspecified: Secondary | ICD-10-CM | POA: Diagnosis not present

## 2018-11-17 DIAGNOSIS — I480 Paroxysmal atrial fibrillation: Secondary | ICD-10-CM | POA: Diagnosis not present

## 2018-11-17 DIAGNOSIS — M109 Gout, unspecified: Secondary | ICD-10-CM | POA: Diagnosis not present

## 2018-11-17 DIAGNOSIS — G8929 Other chronic pain: Secondary | ICD-10-CM | POA: Diagnosis not present

## 2018-11-17 DIAGNOSIS — D469 Myelodysplastic syndrome, unspecified: Secondary | ICD-10-CM | POA: Diagnosis not present

## 2018-11-17 DIAGNOSIS — M86052 Acute hematogenous osteomyelitis, left femur: Secondary | ICD-10-CM | POA: Diagnosis not present

## 2018-11-17 DIAGNOSIS — M150 Primary generalized (osteo)arthritis: Secondary | ICD-10-CM | POA: Diagnosis not present

## 2018-11-20 DIAGNOSIS — G8929 Other chronic pain: Secondary | ICD-10-CM | POA: Diagnosis not present

## 2018-11-20 DIAGNOSIS — M150 Primary generalized (osteo)arthritis: Secondary | ICD-10-CM | POA: Diagnosis not present

## 2018-11-20 DIAGNOSIS — M86052 Acute hematogenous osteomyelitis, left femur: Secondary | ICD-10-CM | POA: Diagnosis not present

## 2018-11-20 DIAGNOSIS — I480 Paroxysmal atrial fibrillation: Secondary | ICD-10-CM | POA: Diagnosis not present

## 2018-11-20 DIAGNOSIS — D469 Myelodysplastic syndrome, unspecified: Secondary | ICD-10-CM | POA: Diagnosis not present

## 2018-11-20 DIAGNOSIS — M109 Gout, unspecified: Secondary | ICD-10-CM | POA: Diagnosis not present

## 2018-11-21 DIAGNOSIS — M86052 Acute hematogenous osteomyelitis, left femur: Secondary | ICD-10-CM | POA: Diagnosis not present

## 2018-11-21 DIAGNOSIS — M109 Gout, unspecified: Secondary | ICD-10-CM | POA: Diagnosis not present

## 2018-11-21 DIAGNOSIS — D469 Myelodysplastic syndrome, unspecified: Secondary | ICD-10-CM | POA: Diagnosis not present

## 2018-11-21 DIAGNOSIS — M150 Primary generalized (osteo)arthritis: Secondary | ICD-10-CM | POA: Diagnosis not present

## 2018-11-21 DIAGNOSIS — I480 Paroxysmal atrial fibrillation: Secondary | ICD-10-CM | POA: Diagnosis not present

## 2018-11-21 DIAGNOSIS — G8929 Other chronic pain: Secondary | ICD-10-CM | POA: Diagnosis not present

## 2018-11-23 ENCOUNTER — Telehealth: Payer: Self-pay

## 2018-11-23 DIAGNOSIS — D469 Myelodysplastic syndrome, unspecified: Secondary | ICD-10-CM | POA: Diagnosis not present

## 2018-11-23 DIAGNOSIS — I480 Paroxysmal atrial fibrillation: Secondary | ICD-10-CM | POA: Diagnosis not present

## 2018-11-23 DIAGNOSIS — M150 Primary generalized (osteo)arthritis: Secondary | ICD-10-CM | POA: Diagnosis not present

## 2018-11-23 DIAGNOSIS — G8929 Other chronic pain: Secondary | ICD-10-CM | POA: Diagnosis not present

## 2018-11-23 DIAGNOSIS — M86052 Acute hematogenous osteomyelitis, left femur: Secondary | ICD-10-CM | POA: Diagnosis not present

## 2018-11-23 DIAGNOSIS — M109 Gout, unspecified: Secondary | ICD-10-CM | POA: Diagnosis not present

## 2018-11-23 NOTE — Telephone Encounter (Signed)
Raniqa from [facility] called to confirm next INR/PT check for pt. She was informed that pt was due to have check on 11/22/18. Pt was seen this week but there was no order placed to do testing at time of home visit. As per Raniqa, it will be done on 11/27/18 during next visit. Wanted provider to be aware of the change.

## 2018-11-23 NOTE — Telephone Encounter (Signed)
Left a detailed vm msg for Raniqa at [facility]. Aware of provider's note. Direct call back info provided.

## 2018-11-23 NOTE — Telephone Encounter (Signed)
That's fine If they need a standing order, they can fax it to me and I'll sign off on INR testing prn

## 2018-11-26 DIAGNOSIS — M150 Primary generalized (osteo)arthritis: Secondary | ICD-10-CM | POA: Diagnosis not present

## 2018-11-26 DIAGNOSIS — Z85831 Personal history of malignant neoplasm of soft tissue: Secondary | ICD-10-CM | POA: Diagnosis not present

## 2018-11-26 DIAGNOSIS — M109 Gout, unspecified: Secondary | ICD-10-CM | POA: Diagnosis not present

## 2018-11-26 DIAGNOSIS — Z86718 Personal history of other venous thrombosis and embolism: Secondary | ICD-10-CM | POA: Diagnosis not present

## 2018-11-26 DIAGNOSIS — G729 Myopathy, unspecified: Secondary | ICD-10-CM | POA: Diagnosis not present

## 2018-11-26 DIAGNOSIS — D469 Myelodysplastic syndrome, unspecified: Secondary | ICD-10-CM | POA: Diagnosis not present

## 2018-11-26 DIAGNOSIS — N183 Chronic kidney disease, stage 3 (moderate): Secondary | ICD-10-CM | POA: Diagnosis not present

## 2018-11-26 DIAGNOSIS — Z95820 Peripheral vascular angioplasty status with implants and grafts: Secondary | ICD-10-CM | POA: Diagnosis not present

## 2018-11-26 DIAGNOSIS — Z5181 Encounter for therapeutic drug level monitoring: Secondary | ICD-10-CM | POA: Diagnosis not present

## 2018-11-26 DIAGNOSIS — G8929 Other chronic pain: Secondary | ICD-10-CM | POA: Diagnosis not present

## 2018-11-26 DIAGNOSIS — I129 Hypertensive chronic kidney disease with stage 1 through stage 4 chronic kidney disease, or unspecified chronic kidney disease: Secondary | ICD-10-CM | POA: Diagnosis not present

## 2018-11-26 DIAGNOSIS — Z7901 Long term (current) use of anticoagulants: Secondary | ICD-10-CM | POA: Diagnosis not present

## 2018-11-26 DIAGNOSIS — I480 Paroxysmal atrial fibrillation: Secondary | ICD-10-CM | POA: Diagnosis not present

## 2018-11-26 DIAGNOSIS — Z79891 Long term (current) use of opiate analgesic: Secondary | ICD-10-CM | POA: Diagnosis not present

## 2018-11-26 DIAGNOSIS — M86052 Acute hematogenous osteomyelitis, left femur: Secondary | ICD-10-CM | POA: Diagnosis not present

## 2018-11-26 DIAGNOSIS — M1991 Primary osteoarthritis, unspecified site: Secondary | ICD-10-CM | POA: Diagnosis not present

## 2018-11-26 DIAGNOSIS — Z792 Long term (current) use of antibiotics: Secondary | ICD-10-CM | POA: Diagnosis not present

## 2018-11-27 ENCOUNTER — Encounter: Payer: Self-pay | Admitting: Osteopathic Medicine

## 2018-11-27 ENCOUNTER — Telehealth: Payer: Self-pay

## 2018-11-27 DIAGNOSIS — D469 Myelodysplastic syndrome, unspecified: Secondary | ICD-10-CM | POA: Diagnosis not present

## 2018-11-27 DIAGNOSIS — M109 Gout, unspecified: Secondary | ICD-10-CM | POA: Diagnosis not present

## 2018-11-27 DIAGNOSIS — M86052 Acute hematogenous osteomyelitis, left femur: Secondary | ICD-10-CM | POA: Diagnosis not present

## 2018-11-27 DIAGNOSIS — M150 Primary generalized (osteo)arthritis: Secondary | ICD-10-CM | POA: Diagnosis not present

## 2018-11-27 DIAGNOSIS — I480 Paroxysmal atrial fibrillation: Secondary | ICD-10-CM | POA: Diagnosis not present

## 2018-11-27 DIAGNOSIS — G8929 Other chronic pain: Secondary | ICD-10-CM | POA: Diagnosis not present

## 2018-11-27 LAB — POCT INR: INR: 2.7 — AB (ref <=1.1)

## 2018-11-27 NOTE — Telephone Encounter (Signed)
[facility] advised of recommendations.

## 2018-11-27 NOTE — Telephone Encounter (Signed)
Continue current dose Recheck INR in 2 weeks  Can we abstract these numbers into her chart?  Thanks!

## 2018-11-27 NOTE — Telephone Encounter (Signed)
Abstracted

## 2018-11-27 NOTE — Telephone Encounter (Signed)
[facility] called and left a message for PT and INR results. She stated Gloriann is taking 5 mg daily. PT 31.7 and INR 2.7. Please advise.

## 2018-11-29 ENCOUNTER — Other Ambulatory Visit: Payer: Self-pay | Admitting: Osteopathic Medicine

## 2018-11-29 NOTE — Telephone Encounter (Signed)
Routing refill request to Dr Redgie Grayer rx refill pool

## 2018-12-01 ENCOUNTER — Other Ambulatory Visit: Payer: Self-pay

## 2018-12-01 DIAGNOSIS — M5136 Other intervertebral disc degeneration, lumbar region: Secondary | ICD-10-CM

## 2018-12-01 DIAGNOSIS — G8928 Other chronic postprocedural pain: Secondary | ICD-10-CM

## 2018-12-01 MED ORDER — HYDROCODONE-ACETAMINOPHEN 7.5-325 MG PO TABS: 1.0000 | ORAL_TABLET | Freq: Four times a day (QID) | ORAL | 0 refills | Status: DC | PRN

## 2018-12-01 NOTE — Telephone Encounter (Signed)
Gloria Mckay requests a refill on Hydrocodone.

## 2018-12-05 ENCOUNTER — Telehealth: Payer: Self-pay

## 2018-12-05 DIAGNOSIS — M150 Primary generalized (osteo)arthritis: Secondary | ICD-10-CM | POA: Diagnosis not present

## 2018-12-05 DIAGNOSIS — M86052 Acute hematogenous osteomyelitis, left femur: Secondary | ICD-10-CM | POA: Diagnosis not present

## 2018-12-05 DIAGNOSIS — M109 Gout, unspecified: Secondary | ICD-10-CM | POA: Diagnosis not present

## 2018-12-05 DIAGNOSIS — I480 Paroxysmal atrial fibrillation: Secondary | ICD-10-CM | POA: Diagnosis not present

## 2018-12-05 DIAGNOSIS — D469 Myelodysplastic syndrome, unspecified: Secondary | ICD-10-CM | POA: Diagnosis not present

## 2018-12-05 DIAGNOSIS — G8929 Other chronic pain: Secondary | ICD-10-CM | POA: Diagnosis not present

## 2018-12-05 NOTE — Telephone Encounter (Signed)
Gregary Signs, from [facility], advised of current plan. No further questions.

## 2018-12-05 NOTE — Telephone Encounter (Signed)
Received call from Gregary Signs, nurse with [facility], reporting that patient had a fall at home 3 days ago. States patient was getting out of bed and noticed that her phone was falling off the night stand; in efforts to catch this, she slipped out of bed, hitting right extremities on nightstand, and landing on her bottom. Nurse reports patient has a small hematoma on right shin and a small bruise on left shoulder, pt complains of a little shoulder pain/soreness,   FYI to PCP

## 2018-12-05 NOTE — Telephone Encounter (Signed)
Called and spoke with Patient, she said she feels "ok." Denies need for office visit at this time, states the home nurse didn't need to call PCP that she would be just fine. Does state she has a bruise on her leg from where it hit her walker, no other complaints at this time. Advised her to continue to monitor and to call us if she is not feeling better. She said she would.

## 2018-12-05 NOTE — Telephone Encounter (Signed)
Noted. If worse, would recommend follow up in office, consider sports med visit if needed

## 2018-12-06 ENCOUNTER — Other Ambulatory Visit: Payer: Self-pay | Admitting: Osteopathic Medicine

## 2018-12-06 DIAGNOSIS — G8929 Other chronic pain: Secondary | ICD-10-CM | POA: Diagnosis not present

## 2018-12-06 DIAGNOSIS — I480 Paroxysmal atrial fibrillation: Secondary | ICD-10-CM | POA: Diagnosis not present

## 2018-12-06 DIAGNOSIS — D469 Myelodysplastic syndrome, unspecified: Secondary | ICD-10-CM | POA: Diagnosis not present

## 2018-12-06 DIAGNOSIS — M86052 Acute hematogenous osteomyelitis, left femur: Secondary | ICD-10-CM | POA: Diagnosis not present

## 2018-12-06 DIAGNOSIS — M150 Primary generalized (osteo)arthritis: Secondary | ICD-10-CM | POA: Diagnosis not present

## 2018-12-06 DIAGNOSIS — M109 Gout, unspecified: Secondary | ICD-10-CM | POA: Diagnosis not present

## 2018-12-06 MED ORDER — FLUCONAZOLE 150 MG PO TABS: 150.0000 mg | ORAL_TABLET | Freq: Once | ORAL | 0 refills | Status: AC

## 2018-12-06 NOTE — Telephone Encounter (Signed)
Pt reports when was in the hospital the last time they stopped her clindamycin Rx. Upon discharge she was restarted on it. She takes clindamycin 300mg  BID. Now needs Rx for diflucan. In the past she usually has to repeat the tablet twice. Refill pended for PCP.

## 2018-12-07 DIAGNOSIS — I480 Paroxysmal atrial fibrillation: Secondary | ICD-10-CM | POA: Diagnosis not present

## 2018-12-07 DIAGNOSIS — M86052 Acute hematogenous osteomyelitis, left femur: Secondary | ICD-10-CM | POA: Diagnosis not present

## 2018-12-07 DIAGNOSIS — D469 Myelodysplastic syndrome, unspecified: Secondary | ICD-10-CM | POA: Diagnosis not present

## 2018-12-07 DIAGNOSIS — M109 Gout, unspecified: Secondary | ICD-10-CM | POA: Diagnosis not present

## 2018-12-07 DIAGNOSIS — G8929 Other chronic pain: Secondary | ICD-10-CM | POA: Diagnosis not present

## 2018-12-07 DIAGNOSIS — M150 Primary generalized (osteo)arthritis: Secondary | ICD-10-CM | POA: Diagnosis not present

## 2018-12-11 ENCOUNTER — Other Ambulatory Visit: Payer: Self-pay

## 2018-12-11 ENCOUNTER — Ambulatory Visit (INDEPENDENT_AMBULATORY_CARE_PROVIDER_SITE_OTHER): Payer: Medicare Other

## 2018-12-11 ENCOUNTER — Ambulatory Visit (INDEPENDENT_AMBULATORY_CARE_PROVIDER_SITE_OTHER): Payer: Medicare Other | Admitting: Family Medicine

## 2018-12-11 ENCOUNTER — Encounter: Payer: Self-pay | Admitting: Family Medicine

## 2018-12-11 VITALS — BP 131/74 | HR 58 | Temp 97.8°F

## 2018-12-11 DIAGNOSIS — D46C Myelodysplastic syndrome with isolated del(5q) chromosomal abnormality: Secondary | ICD-10-CM | POA: Diagnosis not present

## 2018-12-11 DIAGNOSIS — M109 Gout, unspecified: Secondary | ICD-10-CM | POA: Diagnosis not present

## 2018-12-11 DIAGNOSIS — T792XXA Traumatic secondary and recurrent hemorrhage and seroma, initial encounter: Secondary | ICD-10-CM

## 2018-12-11 DIAGNOSIS — D469 Myelodysplastic syndrome, unspecified: Secondary | ICD-10-CM | POA: Diagnosis not present

## 2018-12-11 DIAGNOSIS — R791 Abnormal coagulation profile: Secondary | ICD-10-CM | POA: Diagnosis not present

## 2018-12-11 DIAGNOSIS — I482 Chronic atrial fibrillation, unspecified: Secondary | ICD-10-CM | POA: Diagnosis not present

## 2018-12-11 DIAGNOSIS — Z7901 Long term (current) use of anticoagulants: Secondary | ICD-10-CM

## 2018-12-11 DIAGNOSIS — D6859 Other primary thrombophilia: Secondary | ICD-10-CM | POA: Diagnosis not present

## 2018-12-11 DIAGNOSIS — I480 Paroxysmal atrial fibrillation: Secondary | ICD-10-CM

## 2018-12-11 DIAGNOSIS — M25512 Pain in left shoulder: Secondary | ICD-10-CM

## 2018-12-11 DIAGNOSIS — M86052 Acute hematogenous osteomyelitis, left femur: Secondary | ICD-10-CM | POA: Diagnosis not present

## 2018-12-11 DIAGNOSIS — M150 Primary generalized (osteo)arthritis: Secondary | ICD-10-CM | POA: Diagnosis not present

## 2018-12-11 DIAGNOSIS — D472 Monoclonal gammopathy: Secondary | ICD-10-CM | POA: Diagnosis not present

## 2018-12-11 DIAGNOSIS — G8929 Other chronic pain: Secondary | ICD-10-CM | POA: Diagnosis not present

## 2018-12-11 DIAGNOSIS — M79604 Pain in right leg: Secondary | ICD-10-CM | POA: Diagnosis not present

## 2018-12-11 LAB — PROTIME-INR
INR: 4.3 — ABNORMAL HIGH
Prothrombin Time: 40.7 s — ABNORMAL HIGH (ref 9.0–11.5)

## 2018-12-11 LAB — POCT INR: INR: 4.9 — AB (ref 2.0–3.0)

## 2018-12-11 MED ORDER — AMBULATORY NON FORMULARY MEDICATION: 0 refills | Status: DC

## 2018-12-11 MED ORDER — WARFARIN SODIUM 4 MG PO TABS: 4.0000 mg | ORAL_TABLET | Freq: Every day | ORAL | 1 refills | Status: DC

## 2018-12-11 NOTE — Progress Notes (Signed)
Gloria Mckay is a 78 y.o. female who presents to [facility] [place] today for left shoulder pain and right shin pain.  Additionally discussed INR.   Gloria Mckay about a week ago fell slightly into her walker injuring her left shoulder and contusing her right shin.  She notes both are somewhat painful.  She has bruising and swelling especially on the anterior shin.  She notes shoulder pain worse with abduction and reaching back.  She denies any radiating pain down her arm beyond the level of the elbow.  She denies any fevers or chills nausea vomiting or diarrhea.  She is currently anticoagulated with warfarin.  She takes 5 mg daily.  She denies any excessive bleeding.  Medical history significant for atrial fibrillation and history of DVT necessitating warfarin.  Additionally she has mild dysplastic syndrome managed with oncology.  She does currently have home health engaged.  She is receiving home health physical therapy as well.  ROS as above:  Exam:  BP 131/74    Pulse (!) 58    Temp 97.8 F (36.6 C) (Oral)  Wt Readings from Last 5 Encounters:  10/30/18 111 lb 14.4 oz (50.8 kg)  09/25/18 116 lb 4.8 oz (52.8 kg)  09/19/18 114 lb (51.7 kg)  09/15/18 114 lb (51.7 kg)  09/11/18 114 lb (51.7 kg)    Gen: Well NAD HEENT: EOMI,  MMM Lungs: Normal work of breathing. CTABL Heart: RRR no MRG Abd: NABS, Soft. Nondistended, Nontender Exts: Brisk capillary refill, warm and well perfused.  Left shoulder: Small resolving bruise at anterior left shoulder.  Otherwise normal. Mildly tender to palpation anterior shoulder. Decreased range of motion especially to abduction and internal rotation. Intact strength abduction external and internal rotation.  Positive impingement testing.  Right lower leg: Soft tissue swelling anterior lateral lower leg overlying tibia.  Fluctuant and painful to  touch.  No surrounding erythema or induration.  No expressible pus.    Lab and Radiology Results Results for orders placed or performed in visit on 12/11/18 (from the past 72 hour(s))  POCT INR     Status: Abnormal   Collection Time: 12/11/18  3:52 PM  Result Value Ref Range   INR 4.9 (A) 2.0 - 3.0  INR/PT     Status: Abnormal   Collection Time: 12/11/18  4:17 PM  Result Value Ref Range   INR 4.3 (H)     Comment: Reference Range                     0.9-1.1 Moderate-intensity Warfarin Therapy 2.0-3.0 Higher-intensity Warfarin Therapy   3.0-4.0  .    Prothrombin Time 40.7 (H) 9.0 - 11.5 sec    Comment: . For more information on this test, go to: http://education.questdiagnostics.com/faq/FAQ104 .    No results found.   X-ray images personally independently reviewed  Left shoulder: No acute fractures visible.  Mild chondrocalcinosis present.  Mild degenerative changes present.  Right tib-fib: Normal-appearing without fracture.  Soft tissue swelling present. Await formal radiology review   Assessment and Plan: 78 y.o. female with  Right lower leg pain: Hematoma versus early seroma.  Likely elevated INR is a cofactor here.  Plan for compression and relative rest and recheck in a week or 2.  Will attempt recheck via video visit.  Left shoulder pain: Concern for rotator cuff injury.  Additionally x-ray shows concerning for chondrocalcinosis but that is likely a more chronic issue.  Await radiology over read.  Plan for physical therapy and Aspercreme.  Limited hydrocodone for pain control as well; recheck in 2 weeks with my chart video visit.  Elevated INR: Point-of-care INR elevated and on recheck with phlebotomy lab still elevated at 4.3.  Plan for stopping warfarin for 2 days and starting warfarin at 4 mg daily.  Recheck INR with home health in 1 week. Follow back up with PCP in the near future.   PDMP reviewed during this encounter. Orders Placed This Encounter  Procedures     DG Tibia/Fibula Right    Standing Status:   Future    Number of Occurrences:   1    Standing Expiration Date:   02/10/2020    Order Specific Question:   Reason for Exam (SYMPTOM  OR DIAGNOSIS REQUIRED)    Answer:   eval right leg pain    Order Specific Question:   Preferred imaging location?    Answer:   [facility]    Order Specific Question:   Radiology Contrast Protocol - do NOT remove file path    Answer:   \[email or web address]   DG Shoulder Left    Standing Status:   Future    Number of Occurrences:   1    Standing Expiration Date:   02/10/2020    Order Specific Question:   Reason for Exam (SYMPTOM  OR DIAGNOSIS REQUIRED)    Answer:   eval shoulder pain    Order Specific Question:   Preferred imaging location?    Answer:   [facility]    Order Specific Question:   Radiology Contrast Protocol - do NOT remove file path    Answer:   \[email or web address]   INR/PT   POCT INR    This back office order was created through the Results Console.   Meds ordered this encounter  Medications   AMBULATORY NON FORMULARY MEDICATION    Sig: Extend home health PT to include left shoulder and right leg.  Left rotator cuff injury and contusion right leg    Dispense:  1 each    Refill:  0   warfarin (COUMADIN) 4 MG tablet    Sig: Take 1 tablet (4 mg total) by mouth daily.    Dispense:  30 tablet    Refill:  1     Historical information moved to improve visibility of documentation.  Past Medical History:  Diagnosis Date   Arthritis    Cancer ([place])    Cataract    bilaterally   Hypertension    Myelodysplasia (myelodysplastic syndrome) ([place])    Past Surgical History:  Procedure Laterality Date   JOINT REPLACEMENT     STOMACH SURGERY     Social History   Tobacco Use   Smoking status: Never Smoker   Smokeless tobacco: Never Used  Substance Use Topics   Alcohol use: No    family history includes Heart disease in her father; Hypertension in her mother; Pulmonary embolism in her mother.  Medications: Current Outpatient Medications  Medication Sig Dispense Refill   amLODipine (NORVASC) 10 MG tablet Take 1 tablet (10 mg total) by mouth daily. 90 tablet 3   Calcium Carbonate-Vitamin D 600-400 MG-UNIT chew tablet Chew 2 tablets by mouth daily. 60 tablet 12   carvedilol (COREG) 25 MG tablet Take 1 tablet (25 mg total) by mouth 2 (two) times daily with a meal. 180 tablet 0   Cholecalciferol (VITAMIN D3) 50000 units CAPS Take 1 tablet by mouth daily.  1  clindamycin (CLEOCIN) 300 MG capsule TAKE 1 CAPSULE BY MOUTH TWICE A DAY     denosumab (PROLIA) 60 MG/ML SOSY injection Inject 60 mg into the skin every 6 (six) months. Administer in upper arm, thigh, or abdomen 1 Syringe 1   hydrALAZINE (APRESOLINE) 25 MG tablet Take 1 tablet (25 mg total) by mouth 2 (two) times daily. 180 tablet 3   HYDROcodone-acetaminophen (NORCO) 7.5-325 MG tablet Take 1 tablet by mouth every 6 (six) hours as needed for up to 30 days for moderate pain or severe pain. Use sparingly. #90 for 30(thirty) days 60 tablet 0   hydrOXYzine (ATARAX/VISTARIL) 10 MG tablet Take 1-2 tablets (10-20 mg total) by mouth 3 (three) times daily as needed for itching. 90 tablet 0   losartan (COZAAR) 100 MG tablet Take 1 tablet (100 mg total) by mouth daily. 90 tablet 0   mometasone (ELOCON) 0.1 % cream Apply 1 application topically daily.     mupirocin ointment (BACTROBAN) 2 % Place 1 application into the nose 2 (two) times daily.     Omega-3 Fatty Acids ([employer] FISH OIL PO) Take by mouth.     Prasterone (INTRAROSA) 6.5 MG INST Place 1 suppository vaginally at bedtime. 30 each 12   topiramate (TOPAMAX) 100 MG tablet Take 100 mg by mouth 2 (two) times daily.     Vitamin D, Ergocalciferol, (DRISDOL) 1.25 MG (50000 UT) CAPS capsule Take 1 capsule (50,000 Units total) by mouth every 7 (seven) days. Take for  8 total doses(weeks) 8 capsule 0   AMBULATORY NON FORMULARY MEDICATION Extend home health PT to include left shoulder and right leg.  Left rotator cuff injury and contusion right leg 1 each 0   enoxaparin (LOVENOX) 30 MG/0.3ML injection Inject 0.3 mLs (30 mg total) into the skin daily for 7 days. 10 Syringe 0   warfarin (COUMADIN) 4 MG tablet Take 1 tablet (4 mg total) by mouth daily. 30 tablet 1   No current facility-administered medications for this visit.    Allergies  Allergen Reactions   Cefuroxime Anaphylaxis    Difficulty swallowing pills because they were too dry.  Caused tablet dysphagia.   Diazepam Shortness Of Breath    HYPERVENTILATION      Diphenhydramine Palpitations    Affected heart rate/er told her not to take again   Latex Rash and Shortness Of Breath    Airway swelling   Other Palpitations    Affected heart rate/er told her not to take again   Amlodipine Cough and Other (See Comments)    Not sure   Butorphanol Hives   Diclofenac Hives   Diclofenac Sodium Hives   Diphenhydramine Hcl Palpitations    Affected heart rate/er told her not to take again   Methylpyrrolidone Hives   Ondansetron Hives and Other (See Comments)    Sedates/knocks her out     Oxycodone Hives   Penicillins Hives and Swelling    Most mycin drugs   Quinolones Other (See Comments)    Joint pain   Sulfa Antibiotics Hives and Swelling   Butorphanol Tartrate Hives   Lisinopril Cough    Caused pt to cough   Ramipril Cough    Pt c/o cough   Ace Inhibitors Other (See Comments) and Cough    Lisinopril and ramipril    Ciprofloxacin Rash    Patient prefers not to take Fluoroquinolones    Codeine Nausea Only   Pantoprazole Sodium Diarrhea     Discussed warning signs or symptoms. Please see  discharge instructions. Patient expresses understanding.

## 2018-12-11 NOTE — Patient Instructions (Addendum)
Thank you for coming in today.  Use aspercream over the counter.  Make sure you get the kind that has Trolamine salicylate.  Ok to use deep blue as well.   For warfarin stop for 2 days.  Starting Wednesday start the 4mg  pill of warfarin daily.  I will also try to arrange for home health to check INR at home.   Your INR should be checked in about 1 week.    Home health PT should start working on your shoulder and your leg if needed.    For the leg apply compression in a way that feels good.   Continue motion.   I will have INR results tomorrow.     Seroma A seroma is a collection of fluid on the body that looks like swelling or a mass. Seromas form where tissue has been injured or cut. Seromas vary in size. Some are small and painless. Others may become large and cause pain or discomfort. Many seromas go away on their own as the fluid is naturally absorbed by the body, and some seromas need to be drained. What are the causes? Seromas form as the result of damage to tissue or the removal of tissue. This tissue damage may occur during surgery or because of an injury or trauma. When tissue is disrupted or removed, empty space is created. The body's natural defense system (immune system) causes fluid to enter the empty space and form a seroma. What are the signs or symptoms? Symptoms of this condition include:  Swelling at the site of a surgical cut (incision) or an injury.  Drainage of clear fluid at the surgery or injury site.  Discomfort or pain. How is this diagnosed? This condition is diagnosed based on your symptoms, your medical history, and a physical exam. During the exam, your health care provider will press on the seroma. You may also have tests, including:  Blood tests.  Imaging tests, such as an ultrasound or CT scan. How is this treated? Some seromas go away (resolve) on their own. Your health care provider may monitor you to make sure the seroma does not cause any  complications. If your seroma does not resolve on its own, treatment may include:  Using a needle to drain the fluid from the seroma (needle aspiration).  Inserting a flexible tube (catheter) to drain the fluid.  Applying a bandage (dressing), such as an elastic bandage or binder.  Antibiotic medicines, if the seroma becomes infected. In rare cases, surgery may be done to remove the seroma and repair the area. Follow these instructions at home:   If you were prescribed an antibiotic medicine, take it as told by your health care provider. Do not stop taking the antibiotic even if you start to feel better.  Return to your normal activities as told by your health care provider. Ask your health care provider what activities are safe for you.  Take over-the-counter and prescription medicines only as told by your health care provider.  Check your seroma every day for signs of infection. Check for: ? Redness or pain. ? Fluid or pus. ? More swelling. ? Warmth.  Keep all follow-up visits as told by your health care provider. This is important. Contact a health care provider if:  You have a fever.  You have redness or pain at the site of the seroma.  You have fluid or pus coming from the seroma.  Your seroma is more swollen or is getting bigger.  Your seroma is  warm to the touch. This information is not intended to replace advice given to you by your health care provider. Make sure you discuss any questions you have with your health care provider. Document Released: 12/11/2012 Document Revised: 05/28/2016 Document Reviewed: 05/28/2016 Elsevier Interactive Patient Education  2019 [employer].

## 2018-12-12 DIAGNOSIS — M109 Gout, unspecified: Secondary | ICD-10-CM | POA: Diagnosis not present

## 2018-12-12 DIAGNOSIS — I480 Paroxysmal atrial fibrillation: Secondary | ICD-10-CM | POA: Diagnosis not present

## 2018-12-12 DIAGNOSIS — M86052 Acute hematogenous osteomyelitis, left femur: Secondary | ICD-10-CM | POA: Diagnosis not present

## 2018-12-12 DIAGNOSIS — D469 Myelodysplastic syndrome, unspecified: Secondary | ICD-10-CM | POA: Diagnosis not present

## 2018-12-12 DIAGNOSIS — M150 Primary generalized (osteo)arthritis: Secondary | ICD-10-CM | POA: Diagnosis not present

## 2018-12-12 DIAGNOSIS — G8929 Other chronic pain: Secondary | ICD-10-CM | POA: Diagnosis not present

## 2018-12-14 DIAGNOSIS — I480 Paroxysmal atrial fibrillation: Secondary | ICD-10-CM | POA: Diagnosis not present

## 2018-12-14 DIAGNOSIS — M109 Gout, unspecified: Secondary | ICD-10-CM | POA: Diagnosis not present

## 2018-12-14 DIAGNOSIS — M86052 Acute hematogenous osteomyelitis, left femur: Secondary | ICD-10-CM | POA: Diagnosis not present

## 2018-12-14 DIAGNOSIS — M150 Primary generalized (osteo)arthritis: Secondary | ICD-10-CM | POA: Diagnosis not present

## 2018-12-14 DIAGNOSIS — D469 Myelodysplastic syndrome, unspecified: Secondary | ICD-10-CM | POA: Diagnosis not present

## 2018-12-14 DIAGNOSIS — G8929 Other chronic pain: Secondary | ICD-10-CM | POA: Diagnosis not present

## 2018-12-15 DIAGNOSIS — M109 Gout, unspecified: Secondary | ICD-10-CM | POA: Diagnosis not present

## 2018-12-15 DIAGNOSIS — M150 Primary generalized (osteo)arthritis: Secondary | ICD-10-CM | POA: Diagnosis not present

## 2018-12-15 DIAGNOSIS — M86052 Acute hematogenous osteomyelitis, left femur: Secondary | ICD-10-CM | POA: Diagnosis not present

## 2018-12-15 DIAGNOSIS — G8929 Other chronic pain: Secondary | ICD-10-CM | POA: Diagnosis not present

## 2018-12-15 DIAGNOSIS — I480 Paroxysmal atrial fibrillation: Secondary | ICD-10-CM | POA: Diagnosis not present

## 2018-12-15 DIAGNOSIS — D469 Myelodysplastic syndrome, unspecified: Secondary | ICD-10-CM | POA: Diagnosis not present

## 2018-12-16 ENCOUNTER — Other Ambulatory Visit: Payer: Self-pay | Admitting: Osteopathic Medicine

## 2018-12-16 DIAGNOSIS — D469 Myelodysplastic syndrome, unspecified: Secondary | ICD-10-CM | POA: Diagnosis not present

## 2018-12-16 DIAGNOSIS — M86052 Acute hematogenous osteomyelitis, left femur: Secondary | ICD-10-CM | POA: Diagnosis not present

## 2018-12-16 DIAGNOSIS — M109 Gout, unspecified: Secondary | ICD-10-CM | POA: Diagnosis not present

## 2018-12-16 DIAGNOSIS — G8929 Other chronic pain: Secondary | ICD-10-CM | POA: Diagnosis not present

## 2018-12-16 DIAGNOSIS — M150 Primary generalized (osteo)arthritis: Secondary | ICD-10-CM | POA: Diagnosis not present

## 2018-12-16 DIAGNOSIS — I480 Paroxysmal atrial fibrillation: Secondary | ICD-10-CM | POA: Diagnosis not present

## 2018-12-18 ENCOUNTER — Telehealth: Payer: Self-pay

## 2018-12-18 ENCOUNTER — Encounter: Payer: Self-pay | Admitting: Osteopathic Medicine

## 2018-12-18 DIAGNOSIS — I480 Paroxysmal atrial fibrillation: Secondary | ICD-10-CM | POA: Diagnosis not present

## 2018-12-18 DIAGNOSIS — M150 Primary generalized (osteo)arthritis: Secondary | ICD-10-CM | POA: Diagnosis not present

## 2018-12-18 DIAGNOSIS — G8929 Other chronic pain: Secondary | ICD-10-CM | POA: Diagnosis not present

## 2018-12-18 DIAGNOSIS — M109 Gout, unspecified: Secondary | ICD-10-CM | POA: Diagnosis not present

## 2018-12-18 DIAGNOSIS — M86052 Acute hematogenous osteomyelitis, left femur: Secondary | ICD-10-CM | POA: Diagnosis not present

## 2018-12-18 DIAGNOSIS — D469 Myelodysplastic syndrome, unspecified: Secondary | ICD-10-CM | POA: Diagnosis not present

## 2018-12-18 LAB — POCT INR: INR: 2.7 — AB (ref 0.9–1.1)

## 2018-12-18 NOTE — Telephone Encounter (Signed)
[facility] called with the results of INR.   INR 2.7  Abstracted into chart.

## 2018-12-19 DIAGNOSIS — M109 Gout, unspecified: Secondary | ICD-10-CM | POA: Diagnosis not present

## 2018-12-19 DIAGNOSIS — G8929 Other chronic pain: Secondary | ICD-10-CM | POA: Diagnosis not present

## 2018-12-19 DIAGNOSIS — I480 Paroxysmal atrial fibrillation: Secondary | ICD-10-CM | POA: Diagnosis not present

## 2018-12-19 DIAGNOSIS — M150 Primary generalized (osteo)arthritis: Secondary | ICD-10-CM | POA: Diagnosis not present

## 2018-12-19 DIAGNOSIS — M86052 Acute hematogenous osteomyelitis, left femur: Secondary | ICD-10-CM | POA: Diagnosis not present

## 2018-12-19 DIAGNOSIS — D469 Myelodysplastic syndrome, unspecified: Secondary | ICD-10-CM | POA: Diagnosis not present

## 2018-12-19 NOTE — Telephone Encounter (Signed)
[facility] advised. Verbalized understanding. She will inform Pt of dosing and they will recheck in 1 week. No further questions

## 2018-12-19 NOTE — Telephone Encounter (Signed)
Ah yes! Thanks for reminding me.

## 2018-12-19 NOTE — Telephone Encounter (Signed)
Thanks.  Plan to repeat weekly given labile INR readings - home health should have the order

## 2018-12-19 NOTE — Telephone Encounter (Signed)
For clarification - Pt to continue current dose of warfarin 4mg  daily?

## 2018-12-20 DIAGNOSIS — G8929 Other chronic pain: Secondary | ICD-10-CM | POA: Diagnosis not present

## 2018-12-20 DIAGNOSIS — M109 Gout, unspecified: Secondary | ICD-10-CM | POA: Diagnosis not present

## 2018-12-20 DIAGNOSIS — I480 Paroxysmal atrial fibrillation: Secondary | ICD-10-CM | POA: Diagnosis not present

## 2018-12-20 DIAGNOSIS — M86052 Acute hematogenous osteomyelitis, left femur: Secondary | ICD-10-CM | POA: Diagnosis not present

## 2018-12-20 DIAGNOSIS — M150 Primary generalized (osteo)arthritis: Secondary | ICD-10-CM | POA: Diagnosis not present

## 2018-12-20 DIAGNOSIS — D469 Myelodysplastic syndrome, unspecified: Secondary | ICD-10-CM | POA: Diagnosis not present

## 2018-12-22 ENCOUNTER — Telehealth: Payer: Self-pay

## 2018-12-22 NOTE — Telephone Encounter (Signed)
Patient called stating that Dr. Hulan Fray gave her a prescription for [facility] and it worked well. Patient states that she lost the saving card and would like to get a new one so she can get a refill. Mailed new card to patient as she is a high risk for covid 19. Kathrene Alu RN

## 2018-12-25 ENCOUNTER — Telehealth: Payer: Self-pay | Admitting: Osteopathic Medicine

## 2018-12-25 ENCOUNTER — Telehealth: Payer: Self-pay

## 2018-12-25 DIAGNOSIS — M109 Gout, unspecified: Secondary | ICD-10-CM | POA: Diagnosis not present

## 2018-12-25 DIAGNOSIS — M150 Primary generalized (osteo)arthritis: Secondary | ICD-10-CM | POA: Diagnosis not present

## 2018-12-25 DIAGNOSIS — M86052 Acute hematogenous osteomyelitis, left femur: Secondary | ICD-10-CM | POA: Diagnosis not present

## 2018-12-25 DIAGNOSIS — I480 Paroxysmal atrial fibrillation: Secondary | ICD-10-CM | POA: Diagnosis not present

## 2018-12-25 DIAGNOSIS — D469 Myelodysplastic syndrome, unspecified: Secondary | ICD-10-CM | POA: Diagnosis not present

## 2018-12-25 DIAGNOSIS — G8929 Other chronic pain: Secondary | ICD-10-CM | POA: Diagnosis not present

## 2018-12-25 NOTE — Telephone Encounter (Signed)
Monica with [facility] called and stated that patient has 8/10 pain all day. She took her medication and has been active. Their protocol is to let you know when anything is out of the normal range. I called the patient and she is like this is normal for my shoulder to hurt and she states " I will be ok and thanks for checking on me". FYI.

## 2018-12-25 NOTE — Telephone Encounter (Signed)
Gloria Mckay's INR 2.3 and PT 26.8. [facility] called with results.

## 2018-12-26 DIAGNOSIS — Z792 Long term (current) use of antibiotics: Secondary | ICD-10-CM | POA: Diagnosis not present

## 2018-12-26 DIAGNOSIS — G729 Myopathy, unspecified: Secondary | ICD-10-CM | POA: Diagnosis not present

## 2018-12-26 DIAGNOSIS — Z86718 Personal history of other venous thrombosis and embolism: Secondary | ICD-10-CM | POA: Diagnosis not present

## 2018-12-26 DIAGNOSIS — G8929 Other chronic pain: Secondary | ICD-10-CM | POA: Diagnosis not present

## 2018-12-26 DIAGNOSIS — I480 Paroxysmal atrial fibrillation: Secondary | ICD-10-CM | POA: Diagnosis not present

## 2018-12-26 DIAGNOSIS — I129 Hypertensive chronic kidney disease with stage 1 through stage 4 chronic kidney disease, or unspecified chronic kidney disease: Secondary | ICD-10-CM | POA: Diagnosis not present

## 2018-12-26 DIAGNOSIS — M150 Primary generalized (osteo)arthritis: Secondary | ICD-10-CM | POA: Diagnosis not present

## 2018-12-26 DIAGNOSIS — M109 Gout, unspecified: Secondary | ICD-10-CM | POA: Diagnosis not present

## 2018-12-26 DIAGNOSIS — D469 Myelodysplastic syndrome, unspecified: Secondary | ICD-10-CM | POA: Diagnosis not present

## 2018-12-26 DIAGNOSIS — N183 Chronic kidney disease, stage 3 (moderate): Secondary | ICD-10-CM | POA: Diagnosis not present

## 2018-12-26 DIAGNOSIS — M1991 Primary osteoarthritis, unspecified site: Secondary | ICD-10-CM | POA: Diagnosis not present

## 2018-12-26 DIAGNOSIS — Z79891 Long term (current) use of opiate analgesic: Secondary | ICD-10-CM | POA: Diagnosis not present

## 2018-12-26 DIAGNOSIS — Z5181 Encounter for therapeutic drug level monitoring: Secondary | ICD-10-CM | POA: Diagnosis not present

## 2018-12-26 DIAGNOSIS — Z95820 Peripheral vascular angioplasty status with implants and grafts: Secondary | ICD-10-CM | POA: Diagnosis not present

## 2018-12-26 DIAGNOSIS — Z85831 Personal history of malignant neoplasm of soft tissue: Secondary | ICD-10-CM | POA: Diagnosis not present

## 2018-12-26 DIAGNOSIS — Z7901 Long term (current) use of anticoagulants: Secondary | ICD-10-CM | POA: Diagnosis not present

## 2018-12-26 NOTE — Telephone Encounter (Signed)
Continue current meds and recheck in one week!

## 2018-12-26 NOTE — Telephone Encounter (Signed)
Noted, thanks!

## 2018-12-26 NOTE — Telephone Encounter (Signed)
Left VM for Nurse to return clinic call to get verbal orders, callback information provided

## 2018-12-27 DIAGNOSIS — N183 Chronic kidney disease, stage 3 (moderate): Secondary | ICD-10-CM | POA: Diagnosis not present

## 2018-12-27 DIAGNOSIS — M109 Gout, unspecified: Secondary | ICD-10-CM | POA: Diagnosis not present

## 2018-12-27 DIAGNOSIS — N184 Chronic kidney disease, stage 4 (severe): Secondary | ICD-10-CM | POA: Diagnosis not present

## 2018-12-27 DIAGNOSIS — I480 Paroxysmal atrial fibrillation: Secondary | ICD-10-CM | POA: Diagnosis not present

## 2018-12-27 DIAGNOSIS — M150 Primary generalized (osteo)arthritis: Secondary | ICD-10-CM | POA: Diagnosis not present

## 2018-12-27 DIAGNOSIS — G8929 Other chronic pain: Secondary | ICD-10-CM | POA: Diagnosis not present

## 2018-12-27 DIAGNOSIS — I129 Hypertensive chronic kidney disease with stage 1 through stage 4 chronic kidney disease, or unspecified chronic kidney disease: Secondary | ICD-10-CM | POA: Diagnosis not present

## 2018-12-27 LAB — BASIC METABOLIC PANEL
BUN: 39 — AB (ref 4–21)
Creatinine: 1.8 — AB (ref 0.5–1.1)
Glucose: 84
Sodium: 145 (ref 137–147)

## 2018-12-27 LAB — CBC AND DIFFERENTIAL
HCT: 33 — AB (ref 36–46)
Hemoglobin: 10.5 — AB (ref 12.0–16.0)
Platelets: 265 (ref 150–399)

## 2018-12-27 LAB — LAB REPORT - SCANNED
Albumin: 3.9
Chloride: 119
MCV: 94 (ref 76–111)
Phosphorus: 4.6
Potassium: 4.5
Protein: 5.9
RDW: 15.7

## 2018-12-28 NOTE — Telephone Encounter (Signed)
[facility] advised.

## 2019-01-01 ENCOUNTER — Telehealth: Payer: Self-pay

## 2019-01-01 DIAGNOSIS — M109 Gout, unspecified: Secondary | ICD-10-CM | POA: Diagnosis not present

## 2019-01-01 DIAGNOSIS — I129 Hypertensive chronic kidney disease with stage 1 through stage 4 chronic kidney disease, or unspecified chronic kidney disease: Secondary | ICD-10-CM | POA: Diagnosis not present

## 2019-01-01 DIAGNOSIS — N183 Chronic kidney disease, stage 3 (moderate): Secondary | ICD-10-CM | POA: Diagnosis not present

## 2019-01-01 DIAGNOSIS — G8929 Other chronic pain: Secondary | ICD-10-CM | POA: Diagnosis not present

## 2019-01-01 DIAGNOSIS — I480 Paroxysmal atrial fibrillation: Secondary | ICD-10-CM | POA: Diagnosis not present

## 2019-01-01 DIAGNOSIS — M150 Primary generalized (osteo)arthritis: Secondary | ICD-10-CM | POA: Diagnosis not present

## 2019-01-01 LAB — POCT INR: INR: 1.1 (ref 0.9–1.1)

## 2019-01-01 NOTE — Telephone Encounter (Signed)
since it's been stable on same dose, i'm reluctant to change meds without confirming an out-of-range INR, are we able to check it again in 1-2 days?

## 2019-01-01 NOTE — Telephone Encounter (Signed)
Gloria Mckay is taking 4 mg of coumadin daily  INR 1.1 PT 13.7

## 2019-01-02 NOTE — Telephone Encounter (Signed)
[facility] nurse Rondel Jumbo) notified. They will recheck in 1-2 days on same dose and return clinic call.

## 2019-01-04 DIAGNOSIS — G8929 Other chronic pain: Secondary | ICD-10-CM | POA: Diagnosis not present

## 2019-01-04 DIAGNOSIS — I480 Paroxysmal atrial fibrillation: Secondary | ICD-10-CM | POA: Diagnosis not present

## 2019-01-04 DIAGNOSIS — M150 Primary generalized (osteo)arthritis: Secondary | ICD-10-CM | POA: Diagnosis not present

## 2019-01-04 DIAGNOSIS — N183 Chronic kidney disease, stage 3 (moderate): Secondary | ICD-10-CM | POA: Diagnosis not present

## 2019-01-04 DIAGNOSIS — M109 Gout, unspecified: Secondary | ICD-10-CM | POA: Diagnosis not present

## 2019-01-04 DIAGNOSIS — I129 Hypertensive chronic kidney disease with stage 1 through stage 4 chronic kidney disease, or unspecified chronic kidney disease: Secondary | ICD-10-CM | POA: Diagnosis not present

## 2019-01-05 ENCOUNTER — Telehealth: Payer: Self-pay | Admitting: Osteopathic Medicine

## 2019-01-05 MED ORDER — WARFARIN SODIUM 4 MG PO TABS: ORAL_TABLET | ORAL | 1 refills | Status: DC

## 2019-01-05 NOTE — Telephone Encounter (Signed)
I called the patient and she verified the orders per Dr. Sheppard Coil and she will do a visit for INR around 01/11/2019. She repeated the 4 mg on Sun, Mon, Wed, Thur, Sat and 6 mg on Tuesday and and Friday. No further questions.

## 2019-01-05 NOTE — Telephone Encounter (Signed)
Take 1 tablet (4 mg total) Sun, Mon, Wed, Thu, Sat; Take 1.5 tablets (6 mg total) Fri and Tue. Recheck INR around 01/11/2019

## 2019-01-05 NOTE — Telephone Encounter (Signed)
[place] left a message that the patient's PT was 18.6 and her INR was 1.5. I advised her that I would let you know the results on her VM. Her contact number is [phone]. FYI.

## 2019-01-08 DIAGNOSIS — D472 Monoclonal gammopathy: Secondary | ICD-10-CM | POA: Diagnosis not present

## 2019-01-08 DIAGNOSIS — D46C Myelodysplastic syndrome with isolated del(5q) chromosomal abnormality: Secondary | ICD-10-CM | POA: Diagnosis not present

## 2019-01-09 DIAGNOSIS — I129 Hypertensive chronic kidney disease with stage 1 through stage 4 chronic kidney disease, or unspecified chronic kidney disease: Secondary | ICD-10-CM | POA: Diagnosis not present

## 2019-01-09 DIAGNOSIS — M109 Gout, unspecified: Secondary | ICD-10-CM | POA: Diagnosis not present

## 2019-01-09 DIAGNOSIS — G8929 Other chronic pain: Secondary | ICD-10-CM | POA: Diagnosis not present

## 2019-01-09 DIAGNOSIS — N183 Chronic kidney disease, stage 3 (moderate): Secondary | ICD-10-CM | POA: Diagnosis not present

## 2019-01-09 DIAGNOSIS — M150 Primary generalized (osteo)arthritis: Secondary | ICD-10-CM | POA: Diagnosis not present

## 2019-01-09 DIAGNOSIS — I480 Paroxysmal atrial fibrillation: Secondary | ICD-10-CM | POA: Diagnosis not present

## 2019-01-09 NOTE — Telephone Encounter (Signed)
Amy (nurse - [facility]) advised of INR recheck date and dosage. Verbalized understanding.

## 2019-01-11 ENCOUNTER — Other Ambulatory Visit: Payer: Self-pay | Admitting: Osteopathic Medicine

## 2019-01-11 ENCOUNTER — Telehealth: Payer: Self-pay

## 2019-01-11 DIAGNOSIS — M150 Primary generalized (osteo)arthritis: Secondary | ICD-10-CM | POA: Diagnosis not present

## 2019-01-11 DIAGNOSIS — E559 Vitamin D deficiency, unspecified: Secondary | ICD-10-CM

## 2019-01-11 DIAGNOSIS — M109 Gout, unspecified: Secondary | ICD-10-CM | POA: Diagnosis not present

## 2019-01-11 DIAGNOSIS — I480 Paroxysmal atrial fibrillation: Secondary | ICD-10-CM | POA: Diagnosis not present

## 2019-01-11 DIAGNOSIS — N183 Chronic kidney disease, stage 3 (moderate): Secondary | ICD-10-CM | POA: Diagnosis not present

## 2019-01-11 DIAGNOSIS — G8929 Other chronic pain: Secondary | ICD-10-CM | POA: Diagnosis not present

## 2019-01-11 DIAGNOSIS — I129 Hypertensive chronic kidney disease with stage 1 through stage 4 chronic kidney disease, or unspecified chronic kidney disease: Secondary | ICD-10-CM | POA: Diagnosis not present

## 2019-01-11 NOTE — Telephone Encounter (Signed)
Amy from [facility] called to report pt's INR was 1.1 today.  Patient denies any lifestyle/diet changes. No medication changes.   Pt is taking 4 mg QD except 6 mg on Tuesday and Friday  Amy's CB # [phone]

## 2019-01-12 ENCOUNTER — Other Ambulatory Visit: Payer: Self-pay

## 2019-01-12 MED ORDER — WARFARIN SODIUM 6 MG PO TABS: 6.0000 mg | ORAL_TABLET | Freq: Every day | ORAL | 1 refills | Status: DC

## 2019-01-12 NOTE — Telephone Encounter (Signed)
Pt advised. Pt request new Rx, will send to [place].

## 2019-01-12 NOTE — Telephone Encounter (Signed)
Can call and let patient know increase dose to 6 mg daily and recheck 1 week, can give home health the ok to recheck next Thursday or Friday

## 2019-01-12 NOTE — Telephone Encounter (Signed)
Amy from [facility] notified.

## 2019-01-12 NOTE — Progress Notes (Signed)
Rx sent per INR change by PCP

## 2019-01-18 ENCOUNTER — Telehealth: Payer: Self-pay

## 2019-01-18 DIAGNOSIS — N183 Chronic kidney disease, stage 3 (moderate): Secondary | ICD-10-CM | POA: Diagnosis not present

## 2019-01-18 DIAGNOSIS — G8929 Other chronic pain: Secondary | ICD-10-CM | POA: Diagnosis not present

## 2019-01-18 DIAGNOSIS — M109 Gout, unspecified: Secondary | ICD-10-CM | POA: Diagnosis not present

## 2019-01-18 DIAGNOSIS — M150 Primary generalized (osteo)arthritis: Secondary | ICD-10-CM | POA: Diagnosis not present

## 2019-01-18 DIAGNOSIS — I129 Hypertensive chronic kidney disease with stage 1 through stage 4 chronic kidney disease, or unspecified chronic kidney disease: Secondary | ICD-10-CM | POA: Diagnosis not present

## 2019-01-18 DIAGNOSIS — I480 Paroxysmal atrial fibrillation: Secondary | ICD-10-CM | POA: Diagnosis not present

## 2019-01-18 NOTE — Telephone Encounter (Signed)
[place] from [facility] called with pt's PT/INR results. She stated she checked it twice, since it was low in the past.   First reading: PT 16.3  INR 1.3  Second reading PT 14.5 INR 1.2  Pt did mention she's has been eating spinach and other vegetables. Home nurse is requesting an order for testing and if provider wants to make any changes to pt's current dosing. [place] may be contacted at [phone].

## 2019-01-19 MED ORDER — WARFARIN SODIUM 6 MG PO TABS: ORAL_TABLET | ORAL | 1 refills | Status: DC

## 2019-01-19 NOTE — Telephone Encounter (Signed)
Spoke with patient and confirmed she is taking 6 mg daily Coumadin.  She was advised of new dosage schedule.  Left message on machine for [place] with [facility] to call back to add order for repeat INR next Thursday. Awaiting call back.

## 2019-01-19 NOTE — Telephone Encounter (Signed)
Please call patient: Can we confirm that she is still taking the 6 mg daily dose?  If so, we should increase it: Mon, Weds, Fri, Sat: take 6 mg (one tablet) daily.  Gloria Mckay, Thur: take 9 mg (1.5 tablet) daily   Call home health: recheck INR in one week (Thursday is ok)

## 2019-01-24 ENCOUNTER — Telehealth: Payer: Self-pay

## 2019-01-24 DIAGNOSIS — G8929 Other chronic pain: Secondary | ICD-10-CM | POA: Diagnosis not present

## 2019-01-24 DIAGNOSIS — M109 Gout, unspecified: Secondary | ICD-10-CM | POA: Diagnosis not present

## 2019-01-24 DIAGNOSIS — N183 Chronic kidney disease, stage 3 (moderate): Secondary | ICD-10-CM | POA: Diagnosis not present

## 2019-01-24 DIAGNOSIS — I129 Hypertensive chronic kidney disease with stage 1 through stage 4 chronic kidney disease, or unspecified chronic kidney disease: Secondary | ICD-10-CM | POA: Diagnosis not present

## 2019-01-24 DIAGNOSIS — M150 Primary generalized (osteo)arthritis: Secondary | ICD-10-CM | POA: Diagnosis not present

## 2019-01-24 DIAGNOSIS — I480 Paroxysmal atrial fibrillation: Secondary | ICD-10-CM | POA: Diagnosis not present

## 2019-01-24 LAB — POCT INR: INR: 2.7 — AB (ref 0.9–1.1)

## 2019-01-24 NOTE — Telephone Encounter (Signed)
Gloria Mckay's INR and PT results. She states she is taking 6 mg daily except on Sunday and Tuesday she takes 9 mg.   PT 30.9 INR 2.7  Abstracted results

## 2019-01-24 NOTE — Telephone Encounter (Signed)
Forwarding to provider for review.

## 2019-01-24 NOTE — Telephone Encounter (Signed)
OK continue current dose coumadin and recheck INR in one week

## 2019-01-25 DIAGNOSIS — Z95820 Peripheral vascular angioplasty status with implants and grafts: Secondary | ICD-10-CM | POA: Diagnosis not present

## 2019-01-25 DIAGNOSIS — Z86718 Personal history of other venous thrombosis and embolism: Secondary | ICD-10-CM | POA: Diagnosis not present

## 2019-01-25 DIAGNOSIS — I129 Hypertensive chronic kidney disease with stage 1 through stage 4 chronic kidney disease, or unspecified chronic kidney disease: Secondary | ICD-10-CM | POA: Diagnosis not present

## 2019-01-25 DIAGNOSIS — G8929 Other chronic pain: Secondary | ICD-10-CM | POA: Diagnosis not present

## 2019-01-25 DIAGNOSIS — M1991 Primary osteoarthritis, unspecified site: Secondary | ICD-10-CM | POA: Diagnosis not present

## 2019-01-25 DIAGNOSIS — M150 Primary generalized (osteo)arthritis: Secondary | ICD-10-CM | POA: Diagnosis not present

## 2019-01-25 DIAGNOSIS — Z792 Long term (current) use of antibiotics: Secondary | ICD-10-CM | POA: Diagnosis not present

## 2019-01-25 DIAGNOSIS — Z5181 Encounter for therapeutic drug level monitoring: Secondary | ICD-10-CM | POA: Diagnosis not present

## 2019-01-25 DIAGNOSIS — M109 Gout, unspecified: Secondary | ICD-10-CM | POA: Diagnosis not present

## 2019-01-25 DIAGNOSIS — N183 Chronic kidney disease, stage 3 (moderate): Secondary | ICD-10-CM | POA: Diagnosis not present

## 2019-01-25 DIAGNOSIS — Z79891 Long term (current) use of opiate analgesic: Secondary | ICD-10-CM | POA: Diagnosis not present

## 2019-01-25 DIAGNOSIS — D469 Myelodysplastic syndrome, unspecified: Secondary | ICD-10-CM | POA: Diagnosis not present

## 2019-01-25 DIAGNOSIS — I480 Paroxysmal atrial fibrillation: Secondary | ICD-10-CM | POA: Diagnosis not present

## 2019-01-25 DIAGNOSIS — Z7901 Long term (current) use of anticoagulants: Secondary | ICD-10-CM | POA: Diagnosis not present

## 2019-01-25 DIAGNOSIS — Z85831 Personal history of malignant neoplasm of soft tissue: Secondary | ICD-10-CM | POA: Diagnosis not present

## 2019-01-25 DIAGNOSIS — G729 Myopathy, unspecified: Secondary | ICD-10-CM | POA: Diagnosis not present

## 2019-01-25 NOTE — Telephone Encounter (Signed)
Left message advising of recommendations.  

## 2019-01-29 ENCOUNTER — Encounter: Payer: Self-pay | Admitting: Physician Assistant

## 2019-01-29 ENCOUNTER — Ambulatory Visit (INDEPENDENT_AMBULATORY_CARE_PROVIDER_SITE_OTHER): Payer: Medicare Other | Admitting: Osteopathic Medicine

## 2019-01-29 VITALS — BP 127/76 | HR 60 | Wt 108.0 lb

## 2019-01-29 DIAGNOSIS — G8928 Other chronic postprocedural pain: Secondary | ICD-10-CM

## 2019-01-29 DIAGNOSIS — E559 Vitamin D deficiency, unspecified: Secondary | ICD-10-CM | POA: Diagnosis not present

## 2019-01-29 DIAGNOSIS — M5136 Other intervertebral disc degeneration, lumbar region: Secondary | ICD-10-CM | POA: Diagnosis not present

## 2019-01-29 DIAGNOSIS — I10 Essential (primary) hypertension: Secondary | ICD-10-CM | POA: Diagnosis not present

## 2019-01-29 MED ORDER — VITAMIN D (ERGOCALCIFEROL) 1.25 MG (50000 UNIT) PO CAPS: 50000.0000 [IU] | ORAL_CAPSULE | ORAL | 1 refills | Status: DC

## 2019-01-29 MED ORDER — HYDROCODONE-ACETAMINOPHEN 7.5-325 MG PO TABS: 1.0000 | ORAL_TABLET | Freq: Four times a day (QID) | ORAL | 0 refills | Status: DC | PRN

## 2019-01-29 NOTE — Progress Notes (Signed)
Virtual Visit via Video (App used: Doximity) Note  I connected with      Gloria Mckay on 01/29/19 at 4:13 PM by a telemedicine application and verified that I am speaking with the correct person using two identifiers.  Patient is at home I am working from home    I discussed the limitations of evaluation and management by telemedicine and the availability of in person appointments. The patient expressed understanding and agreed to proceed.  History of Present Illness: Gloria Mckay is a 78 y.o. female who would like to discuss  Chief Complaint  Patient presents with  . Medication Management      Due for refill on pain medications   Indication for chronic opioid: chronic pain syndrome, history of leg surgery w/ residual pain / scar tissue Medication and dose: Hydrocodone-APAP 7.5-325 mg # pills per month: 60 Last UDS date: due but virtual visit Opioid Treatment Agreement signed (Y/N): needs updated Opioid Treatment Agreement last reviewed with patient:   NCCSRS reviewed this encounter (include red flags): Yesno concerns PDMP reviewed during this encounter.       Observations/Objective: BP 127/76   Pulse 60   Wt 108 lb (49 kg)   BMI 19.13 kg/m  BP Readings from Last 3 Encounters:  01/29/19 127/76  12/11/18 131/74  10/30/18 (!) 166/82   Exam: Normal Speech.  NAD  Lab and Radiology Results No results found for this or any previous visit (from the past 72 hour(s)). No results found.     Assessment and Plan: 78 y.o. female with The primary encounter diagnosis was Other chronic postprocedural pain. Diagnoses of Essential hypertension, DDD (degenerative disc disease), lumbar, and Vitamin D deficiency were also pertinent to this visit.   PDMP reviewed during this encounter. No orders of the defined types were placed in this encounter.  Meds ordered this encounter  Medications  . HYDROcodone-acetaminophen (NORCO) 7.5-325 MG tablet    Sig: Take 1 tablet by mouth  every 6 (six) hours as needed for up to 30 days for moderate pain or severe pain.    Dispense:  60 tablet    Refill:  0  . Vitamin D, Ergocalciferol, (DRISDOL) 1.25 MG (50000 UT) CAPS capsule    Sig: Take 1 capsule (50,000 Units total) by mouth every 7 (seven) days.    Dispense:  12 capsule    Refill:  1   There are no Patient Instructions on file for this visit.  Instructions sent via MyChart. If MyChart not available, pt was given option for info via personal e-mail w/ no guarantee of protected health info over unsecured e-mail communication, and MyChart sign-up instructions were included.   Follow Up Instructions: Return in about 3 months (around 05/01/2019) for renew pain medication prescription, see me sooner if needed .    I discussed the assessment and treatment plan with the patient. The patient was provided an opportunity to ask questions and all were answered. The patient agreed with the plan and demonstrated an understanding of the instructions.   The patient was advised to call back or seek an in-person evaluation if any new concerns, if symptoms worsen or if the condition fails to improve as anticipated.  25 minutes of non-face-to-face time was provided during this encounter.                      Historical information moved to improve visibility of documentation.  Past Medical History:  Diagnosis Date  . Arthritis   .  Cancer ([place])   . Cataract    bilaterally  . Hypertension   . Myelodysplasia (myelodysplastic syndrome) ([place])    Past Surgical History:  Procedure Laterality Date  . JOINT REPLACEMENT    . STOMACH SURGERY     Social History   Tobacco Use  . Smoking status: Never Smoker  . Smokeless tobacco: Never Used  Substance Use Topics  . Alcohol use: No   family history includes Heart disease in her father; Hypertension in her mother; Pulmonary embolism in her mother.  Medications: Current Outpatient Medications  Medication Sig Dispense  Refill  . Calcium Carbonate-Vitamin D 600-400 MG-UNIT chew tablet Chew 2 tablets by mouth daily. 60 tablet 12  . Cholecalciferol (VITAMIN D3) 50000 units CAPS Take 1 tablet by mouth daily.  1  . hydrALAZINE (APRESOLINE) 25 MG tablet Take 1 tablet (25 mg total) by mouth 2 (two) times daily. 180 tablet 3  . losartan (COZAAR) 100 MG tablet Take 1 tablet (100 mg total) by mouth daily. 90 tablet 0  . Omega-3 Fatty Acids ([employer] FISH OIL PO) Take by mouth.    . topiramate (TOPAMAX) 100 MG tablet Take 100 mg by mouth 2 (two) times daily.    . Vitamin D, Ergocalciferol, (DRISDOL) 1.25 MG (50000 UT) CAPS capsule Take 1 capsule (50,000 Units total) by mouth every 7 (seven) days. 12 capsule 1  . warfarin (COUMADIN) 6 MG tablet Mon, Weds, Fri, Sat: take 6 mg (one tablet) daily. [place], Thur: take 9 mg (1.5 tablet) daily 90 tablet 1  . AMBULATORY NON FORMULARY MEDICATION Extend home health PT to include left shoulder and right leg.  Left rotator cuff injury and contusion right leg 1 each 0  . amLODipine (NORVASC) 10 MG tablet Take 1 tablet (10 mg total) by mouth daily. 90 tablet 3  . carvedilol (COREG) 25 MG tablet TAKE 1 TABLET (25 MG TOTAL) BY MOUTH 2 (TWO) TIMES DAILY WITH A MEAL. 180 tablet 0  . clindamycin (CLEOCIN) 300 MG capsule TAKE 1 CAPSULE BY MOUTH TWICE A DAY    . denosumab (PROLIA) 60 MG/ML SOSY injection Inject 60 mg into the skin every 6 (six) months. Administer in upper arm, thigh, or abdomen 1 Syringe 1  . enoxaparin (LOVENOX) 30 MG/0.3ML injection Inject 0.3 mLs (30 mg total) into the skin daily for 7 days. 10 Syringe 0  . HYDROcodone-acetaminophen (NORCO) 7.5-325 MG tablet Take 1 tablet by mouth every 6 (six) hours as needed for up to 30 days for moderate pain or severe pain. 60 tablet 0  . mometasone (ELOCON) 0.1 % cream Apply 1 application topically daily.    . mupirocin ointment (BACTROBAN) 2 % Place 1 application into the nose 2 (two) times daily.    . Prasterone (INTRAROSA) 6.5 MG INST  Place 1 suppository vaginally at bedtime. 30 each 12   No current facility-administered medications for this visit.    Allergies  Allergen Reactions  . Cefuroxime Anaphylaxis    Difficulty swallowing pills because they were too dry.  Caused tablet dysphagia.  . Diazepam Shortness Of Breath    HYPERVENTILATION     . Diphenhydramine Palpitations    Affected heart rate/er told her not to take again  . Latex Rash and Shortness Of Breath    Airway swelling  . Other Palpitations    Affected heart rate/er told her not to take again  . Amlodipine Cough and Other (See Comments)    Not sure  . Butorphanol Hives  . Diclofenac  Hives  . Diclofenac Sodium Hives  . Diphenhydramine Hcl Palpitations    Affected heart rate/er told her not to take again  . Methylpyrrolidone Hives  . Ondansetron Hives and Other (See Comments)    Sedates/knocks her out    . Oxycodone Hives  . Penicillins Hives and Swelling    Most mycin drugs  . Quinolones Other (See Comments)    Joint pain  . Sulfa Antibiotics Hives and Swelling  . Butorphanol Tartrate Hives  . Lisinopril Cough    Caused pt to cough  . Ramipril Cough    Pt c/o cough  . Ace Inhibitors Other (See Comments) and Cough    Lisinopril and ramipril   . Ciprofloxacin Rash    Patient prefers not to take Fluoroquinolones   . Codeine Nausea Only  . Pantoprazole Sodium Diarrhea    PDMP reviewed during this encounter. No orders of the defined types were placed in this encounter.  Meds ordered this encounter  Medications  . HYDROcodone-acetaminophen (NORCO) 7.5-325 MG tablet    Sig: Take 1 tablet by mouth every 6 (six) hours as needed for up to 30 days for moderate pain or severe pain.    Dispense:  60 tablet    Refill:  0  . Vitamin D, Ergocalciferol, (DRISDOL) 1.25 MG (50000 UT) CAPS capsule    Sig: Take 1 capsule (50,000 Units total) by mouth every 7 (seven) days.    Dispense:  12 capsule    Refill:  1

## 2019-01-30 ENCOUNTER — Encounter: Payer: Self-pay | Admitting: Osteopathic Medicine

## 2019-01-31 ENCOUNTER — Telehealth: Payer: Self-pay

## 2019-01-31 DIAGNOSIS — I480 Paroxysmal atrial fibrillation: Secondary | ICD-10-CM | POA: Diagnosis not present

## 2019-01-31 DIAGNOSIS — N183 Chronic kidney disease, stage 3 (moderate): Secondary | ICD-10-CM | POA: Diagnosis not present

## 2019-01-31 DIAGNOSIS — G8929 Other chronic pain: Secondary | ICD-10-CM | POA: Diagnosis not present

## 2019-01-31 DIAGNOSIS — M109 Gout, unspecified: Secondary | ICD-10-CM | POA: Diagnosis not present

## 2019-01-31 DIAGNOSIS — I129 Hypertensive chronic kidney disease with stage 1 through stage 4 chronic kidney disease, or unspecified chronic kidney disease: Secondary | ICD-10-CM | POA: Diagnosis not present

## 2019-01-31 DIAGNOSIS — M150 Primary generalized (osteo)arthritis: Secondary | ICD-10-CM | POA: Diagnosis not present

## 2019-01-31 LAB — POCT INR
INR: 4.7 — AB (ref 0.9–1.1)
INR: 5 — AB (ref 0.9–1.1)

## 2019-01-31 NOTE — Telephone Encounter (Signed)
Not sure how she could be taking 6.5 mg...  Would skip today's dose (or tomorrow's if she's already taken it today) and cut back to 6 mg daily except take 9 on Sunday, recheck INR Next Weds or Thurs.

## 2019-01-31 NOTE — Telephone Encounter (Signed)
[facility] called and left message with results of the INR / PT.  Gloria Mckay is taking 6 mg M,W, F and Sat and then 9 mg Sun, Tues and Thursday. This dosing schedule was confirmed with Dorian Pod.   The last note for her INR values states a different dosing. When the [facility] nurse left the message she stated she was taking 6 mg on M, W, F and Sat and she stated (clearly) 6.5 Sun, Tues and Thurs.   INR 5.0   Recheck 4.7  PT 54.7  Recheck 51.8  Abstracted

## 2019-01-31 NOTE — Telephone Encounter (Signed)
Called pt and informed her of recommendations. She repeated the directions on how she is to take he dosage and did not have any questions.Maryruth Eve, [place] Crocker, CMA

## 2019-01-31 NOTE — Telephone Encounter (Signed)
lvm w/Joyia and informed her of recommendations and advised that I will call Ms. Buttram and tell he that I will call her and advise he of this also.Elouise Munroe, [place]

## 2019-02-06 DIAGNOSIS — D472 Monoclonal gammopathy: Secondary | ICD-10-CM | POA: Diagnosis not present

## 2019-02-06 DIAGNOSIS — D46C Myelodysplastic syndrome with isolated del(5q) chromosomal abnormality: Secondary | ICD-10-CM | POA: Diagnosis not present

## 2019-02-07 ENCOUNTER — Telehealth: Payer: Self-pay

## 2019-02-07 DIAGNOSIS — I480 Paroxysmal atrial fibrillation: Secondary | ICD-10-CM | POA: Diagnosis not present

## 2019-02-07 DIAGNOSIS — I129 Hypertensive chronic kidney disease with stage 1 through stage 4 chronic kidney disease, or unspecified chronic kidney disease: Secondary | ICD-10-CM | POA: Diagnosis not present

## 2019-02-07 DIAGNOSIS — M109 Gout, unspecified: Secondary | ICD-10-CM | POA: Diagnosis not present

## 2019-02-07 DIAGNOSIS — M150 Primary generalized (osteo)arthritis: Secondary | ICD-10-CM | POA: Diagnosis not present

## 2019-02-07 DIAGNOSIS — G8929 Other chronic pain: Secondary | ICD-10-CM | POA: Diagnosis not present

## 2019-02-07 DIAGNOSIS — N183 Chronic kidney disease, stage 3 (moderate): Secondary | ICD-10-CM | POA: Diagnosis not present

## 2019-02-07 LAB — POCT INR: INR: 3.1 — AB (ref 0.9–1.1)

## 2019-02-07 NOTE — Telephone Encounter (Signed)
Gloria Mckay with [facility] called and left a message with INR and PT numbers. She states Gloria Mckay is taking 9 mg on Sunday and 6 mg other daily except she was to hold on 01/31/19 and 02/01/19. Please advise.   PT 35.6 INR 3.1  Abstracted INR

## 2019-02-08 MED ORDER — WARFARIN SODIUM 6 MG PO TABS: ORAL_TABLET | ORAL | 1 refills | Status: DC

## 2019-02-08 NOTE — Telephone Encounter (Signed)
Gregary Signs advised and repeated recommendations.

## 2019-02-08 NOTE — Telephone Encounter (Signed)
Let's try going back to 6 mg daily, recheck INR in 1 week.  I updated medication list.

## 2019-02-14 DIAGNOSIS — T8459XD Infection and inflammatory reaction due to other internal joint prosthesis, subsequent encounter: Secondary | ICD-10-CM | POA: Diagnosis not present

## 2019-02-14 DIAGNOSIS — J470 Bronchiectasis with acute lower respiratory infection: Secondary | ICD-10-CM | POA: Diagnosis not present

## 2019-02-14 DIAGNOSIS — D472 Monoclonal gammopathy: Secondary | ICD-10-CM | POA: Diagnosis not present

## 2019-02-14 DIAGNOSIS — A310 Pulmonary mycobacterial infection: Secondary | ICD-10-CM | POA: Diagnosis not present

## 2019-02-14 DIAGNOSIS — I10 Essential (primary) hypertension: Secondary | ICD-10-CM | POA: Diagnosis not present

## 2019-02-14 DIAGNOSIS — B950 Streptococcus, group A, as the cause of diseases classified elsewhere: Secondary | ICD-10-CM | POA: Diagnosis not present

## 2019-02-14 DIAGNOSIS — D803 Selective deficiency of immunoglobulin G [IgG] subclasses: Secondary | ICD-10-CM | POA: Diagnosis not present

## 2019-02-14 DIAGNOSIS — Z96659 Presence of unspecified artificial knee joint: Secondary | ICD-10-CM | POA: Diagnosis not present

## 2019-02-15 ENCOUNTER — Telehealth: Payer: Self-pay

## 2019-02-15 DIAGNOSIS — G8929 Other chronic pain: Secondary | ICD-10-CM | POA: Diagnosis not present

## 2019-02-15 DIAGNOSIS — I129 Hypertensive chronic kidney disease with stage 1 through stage 4 chronic kidney disease, or unspecified chronic kidney disease: Secondary | ICD-10-CM | POA: Diagnosis not present

## 2019-02-15 DIAGNOSIS — M109 Gout, unspecified: Secondary | ICD-10-CM | POA: Diagnosis not present

## 2019-02-15 DIAGNOSIS — N183 Chronic kidney disease, stage 3 (moderate): Secondary | ICD-10-CM | POA: Diagnosis not present

## 2019-02-15 DIAGNOSIS — M150 Primary generalized (osteo)arthritis: Secondary | ICD-10-CM | POA: Diagnosis not present

## 2019-02-15 DIAGNOSIS — I480 Paroxysmal atrial fibrillation: Secondary | ICD-10-CM | POA: Diagnosis not present

## 2019-02-15 NOTE — Telephone Encounter (Signed)
Gregary Signs from [facility] called to report pt's PT was 34.8 and her INR was 3.0  Pt taking 6 mg QD  Please advise of any changes/recheck date   CB# [phone]

## 2019-02-15 NOTE — Telephone Encounter (Signed)
Yay INR is good, continue current dose and recheck 1 week

## 2019-02-16 NOTE — Telephone Encounter (Signed)
Called and advised Gregary Signs with [facility] and patient

## 2019-02-21 ENCOUNTER — Telehealth: Payer: Self-pay

## 2019-02-21 DIAGNOSIS — I129 Hypertensive chronic kidney disease with stage 1 through stage 4 chronic kidney disease, or unspecified chronic kidney disease: Secondary | ICD-10-CM | POA: Diagnosis not present

## 2019-02-21 DIAGNOSIS — M150 Primary generalized (osteo)arthritis: Secondary | ICD-10-CM | POA: Diagnosis not present

## 2019-02-21 DIAGNOSIS — N183 Chronic kidney disease, stage 3 (moderate): Secondary | ICD-10-CM | POA: Diagnosis not present

## 2019-02-21 DIAGNOSIS — M109 Gout, unspecified: Secondary | ICD-10-CM | POA: Diagnosis not present

## 2019-02-21 DIAGNOSIS — G8929 Other chronic pain: Secondary | ICD-10-CM | POA: Diagnosis not present

## 2019-02-21 DIAGNOSIS — I480 Paroxysmal atrial fibrillation: Secondary | ICD-10-CM | POA: Diagnosis not present

## 2019-02-21 LAB — POCT INR: INR: 2.2 — AB (ref 0.9–1.1)

## 2019-02-21 NOTE — Telephone Encounter (Signed)
Excellent, plan to continue current dose and repeat INR in a week  Thanks!

## 2019-02-21 NOTE — Telephone Encounter (Signed)
Amy with [facility] called and left a message stating Gloria Mckay's INR was 2.2. Gloria Mckay states she ate a whole can of green spinach.  Gloria Mckay takes 6 mg daily except Sunday 9 mg.    Abstracted

## 2019-02-22 NOTE — Telephone Encounter (Signed)
Amy with [facility] and pt both advised.

## 2019-02-24 DIAGNOSIS — Z5181 Encounter for therapeutic drug level monitoring: Secondary | ICD-10-CM | POA: Diagnosis not present

## 2019-02-24 DIAGNOSIS — M150 Primary generalized (osteo)arthritis: Secondary | ICD-10-CM | POA: Diagnosis not present

## 2019-02-24 DIAGNOSIS — Z85831 Personal history of malignant neoplasm of soft tissue: Secondary | ICD-10-CM | POA: Diagnosis not present

## 2019-02-24 DIAGNOSIS — Z7901 Long term (current) use of anticoagulants: Secondary | ICD-10-CM | POA: Diagnosis not present

## 2019-02-24 DIAGNOSIS — D469 Myelodysplastic syndrome, unspecified: Secondary | ICD-10-CM | POA: Diagnosis not present

## 2019-02-24 DIAGNOSIS — Z95820 Peripheral vascular angioplasty status with implants and grafts: Secondary | ICD-10-CM | POA: Diagnosis not present

## 2019-02-24 DIAGNOSIS — Z79891 Long term (current) use of opiate analgesic: Secondary | ICD-10-CM | POA: Diagnosis not present

## 2019-02-24 DIAGNOSIS — M109 Gout, unspecified: Secondary | ICD-10-CM | POA: Diagnosis not present

## 2019-02-24 DIAGNOSIS — G8929 Other chronic pain: Secondary | ICD-10-CM | POA: Diagnosis not present

## 2019-02-24 DIAGNOSIS — Z86718 Personal history of other venous thrombosis and embolism: Secondary | ICD-10-CM | POA: Diagnosis not present

## 2019-02-24 DIAGNOSIS — I480 Paroxysmal atrial fibrillation: Secondary | ICD-10-CM | POA: Diagnosis not present

## 2019-02-24 DIAGNOSIS — Z792 Long term (current) use of antibiotics: Secondary | ICD-10-CM | POA: Diagnosis not present

## 2019-02-24 DIAGNOSIS — M1991 Primary osteoarthritis, unspecified site: Secondary | ICD-10-CM | POA: Diagnosis not present

## 2019-02-24 DIAGNOSIS — N183 Chronic kidney disease, stage 3 (moderate): Secondary | ICD-10-CM | POA: Diagnosis not present

## 2019-02-24 DIAGNOSIS — G729 Myopathy, unspecified: Secondary | ICD-10-CM | POA: Diagnosis not present

## 2019-02-24 DIAGNOSIS — I129 Hypertensive chronic kidney disease with stage 1 through stage 4 chronic kidney disease, or unspecified chronic kidney disease: Secondary | ICD-10-CM | POA: Diagnosis not present

## 2019-02-26 ENCOUNTER — Other Ambulatory Visit: Payer: Self-pay | Admitting: Osteopathic Medicine

## 2019-02-26 DIAGNOSIS — J470 Bronchiectasis with acute lower respiratory infection: Secondary | ICD-10-CM | POA: Diagnosis not present

## 2019-02-26 DIAGNOSIS — J9811 Atelectasis: Secondary | ICD-10-CM | POA: Diagnosis not present

## 2019-02-26 DIAGNOSIS — G8928 Other chronic postprocedural pain: Secondary | ICD-10-CM

## 2019-02-26 DIAGNOSIS — M5136 Other intervertebral disc degeneration, lumbar region: Secondary | ICD-10-CM

## 2019-02-26 DIAGNOSIS — R918 Other nonspecific abnormal finding of lung field: Secondary | ICD-10-CM | POA: Diagnosis not present

## 2019-02-26 DIAGNOSIS — A310 Pulmonary mycobacterial infection: Secondary | ICD-10-CM | POA: Diagnosis not present

## 2019-02-26 NOTE — Telephone Encounter (Signed)
[name] requesting med refill for hydrocodone-ace.

## 2019-02-26 NOTE — Telephone Encounter (Signed)
Please advise 

## 2019-02-28 DIAGNOSIS — I129 Hypertensive chronic kidney disease with stage 1 through stage 4 chronic kidney disease, or unspecified chronic kidney disease: Secondary | ICD-10-CM | POA: Diagnosis not present

## 2019-02-28 DIAGNOSIS — M109 Gout, unspecified: Secondary | ICD-10-CM | POA: Diagnosis not present

## 2019-02-28 DIAGNOSIS — M150 Primary generalized (osteo)arthritis: Secondary | ICD-10-CM | POA: Diagnosis not present

## 2019-02-28 DIAGNOSIS — I480 Paroxysmal atrial fibrillation: Secondary | ICD-10-CM | POA: Diagnosis not present

## 2019-02-28 DIAGNOSIS — G8929 Other chronic pain: Secondary | ICD-10-CM | POA: Diagnosis not present

## 2019-02-28 DIAGNOSIS — N183 Chronic kidney disease, stage 3 (moderate): Secondary | ICD-10-CM | POA: Diagnosis not present

## 2019-02-28 LAB — POCT INR: INR: 3.1 — AB (ref 0.9–1.1)

## 2019-03-01 ENCOUNTER — Telehealth: Payer: Self-pay

## 2019-03-01 NOTE — Telephone Encounter (Signed)
Gloria Mckay and pt both advised.

## 2019-03-01 NOTE — Telephone Encounter (Signed)
Only slightly out of range Lets leave dose same since it's been good past few checks Let's recheck next week weds or thurs!

## 2019-03-01 NOTE — Telephone Encounter (Signed)
Opened in error

## 2019-03-01 NOTE — Telephone Encounter (Signed)
[facility] called and left a message with the INR results.   INR - 3.1  PT 35.5  She reports patient is taking 6 mg daily. She also reported she only had 1/2 can of greens this past week.

## 2019-03-07 ENCOUNTER — Telehealth: Payer: Self-pay

## 2019-03-07 DIAGNOSIS — I129 Hypertensive chronic kidney disease with stage 1 through stage 4 chronic kidney disease, or unspecified chronic kidney disease: Secondary | ICD-10-CM | POA: Diagnosis not present

## 2019-03-07 DIAGNOSIS — G8929 Other chronic pain: Secondary | ICD-10-CM | POA: Diagnosis not present

## 2019-03-07 DIAGNOSIS — I480 Paroxysmal atrial fibrillation: Secondary | ICD-10-CM | POA: Diagnosis not present

## 2019-03-07 DIAGNOSIS — M150 Primary generalized (osteo)arthritis: Secondary | ICD-10-CM | POA: Diagnosis not present

## 2019-03-07 DIAGNOSIS — N183 Chronic kidney disease, stage 3 (moderate): Secondary | ICD-10-CM | POA: Diagnosis not present

## 2019-03-07 DIAGNOSIS — M109 Gout, unspecified: Secondary | ICD-10-CM | POA: Diagnosis not present

## 2019-03-07 LAB — POCT INR: INR: 3.1 — AB (ref 0.9–1.1)

## 2019-03-07 NOTE — Telephone Encounter (Signed)
Keep dose same, recheck 1 week

## 2019-03-07 NOTE — Telephone Encounter (Signed)
Patient and Santiago Glad with [facility] advised.

## 2019-03-07 NOTE — Telephone Encounter (Signed)
Gloria Mckay with [facility] called and left results for PT and INR  PT 35.2 INR 3.1  Abstracted INR

## 2019-03-19 ENCOUNTER — Encounter: Payer: Self-pay | Admitting: Obstetrics & Gynecology

## 2019-03-19 ENCOUNTER — Ambulatory Visit (INDEPENDENT_AMBULATORY_CARE_PROVIDER_SITE_OTHER): Payer: Medicare Other | Admitting: Obstetrics & Gynecology

## 2019-03-19 ENCOUNTER — Other Ambulatory Visit: Payer: Self-pay

## 2019-03-19 VITALS — BP 150/79 | HR 58 | Resp 16 | Ht 60.0 in | Wt 111.0 lb

## 2019-03-19 DIAGNOSIS — F439 Reaction to severe stress, unspecified: Secondary | ICD-10-CM

## 2019-03-19 DIAGNOSIS — R32 Unspecified urinary incontinence: Secondary | ICD-10-CM

## 2019-03-19 MED ORDER — CLOBETASOL PROPIONATE 0.05 % EX OINT: TOPICAL_OINTMENT | CUTANEOUS | 6 refills | Status: DC

## 2019-03-19 NOTE — Progress Notes (Signed)
   Subjective:    Patient ID: Gloria Mckay, female    DOB: Jun 04, 1941, 78 y.o.   MRN: [id number]  HPI 78 yo widowed P4 lady here for vulvar itching. She tried the [place] and it helped, but her insurance doesn't cover it. She thinks that the itching is compounded by her urinary incontinence which forces her to wear pads. She tried a steroid cream in the past and it did help.   Review of Systems She sees a nephrologist for her kidney failure.    Objective:   Physical Exam Frail White lady, uses a cane when walking Breathing, conversing normally Vulva- marked atrophy, excoriation marks on the labia (both sides) c/w scratching Vagina- minimal discharge, marked atrophy She seems very frustrated and some sad.    Assessment & Plan:  Vulvar itching- due to atrophy compounded by pads and incontinence I have prescribed compounded vaginal estrogen (She thinks that she can afford this), referral to urology, and and temovate cream Come back 2 months/prn sooner Stress- she declines antidepressant but is willing to speak with Roselyn Reef when it is convenient for her I referred her to integrated behavior health

## 2019-03-20 DIAGNOSIS — H353132 Nonexudative age-related macular degeneration, bilateral, intermediate dry stage: Secondary | ICD-10-CM | POA: Diagnosis not present

## 2019-03-22 DIAGNOSIS — G8929 Other chronic pain: Secondary | ICD-10-CM | POA: Diagnosis not present

## 2019-03-22 DIAGNOSIS — I480 Paroxysmal atrial fibrillation: Secondary | ICD-10-CM | POA: Diagnosis not present

## 2019-03-22 DIAGNOSIS — M150 Primary generalized (osteo)arthritis: Secondary | ICD-10-CM | POA: Diagnosis not present

## 2019-03-22 DIAGNOSIS — I129 Hypertensive chronic kidney disease with stage 1 through stage 4 chronic kidney disease, or unspecified chronic kidney disease: Secondary | ICD-10-CM | POA: Diagnosis not present

## 2019-03-22 DIAGNOSIS — N183 Chronic kidney disease, stage 3 (moderate): Secondary | ICD-10-CM | POA: Diagnosis not present

## 2019-03-22 DIAGNOSIS — M109 Gout, unspecified: Secondary | ICD-10-CM | POA: Diagnosis not present

## 2019-03-23 ENCOUNTER — Telehealth: Payer: Self-pay

## 2019-03-23 NOTE — Telephone Encounter (Signed)
Patient advised.   Left Rynica msg to call back for orders.. unable to leave detailed VM due to no identification on voicemail

## 2019-03-23 NOTE — Telephone Encounter (Signed)
Continue current medication dosage, recheck INR in 2 weeks

## 2019-03-23 NOTE — Telephone Encounter (Signed)
Rynica from [facility] called - pt had INR check on 03/21/19. Pt's reading was:  PT: 32.4 IRN: 2.8  Pt is currently taking warfarin as:   Mon-Sat 6 mg Sun - 9 mg  As per Rynica - should pt continue taking current regimen or will provider adjust daily intake of warfarin? Pls advise, thanks.

## 2019-03-23 NOTE — Telephone Encounter (Signed)
Rynica advised

## 2019-03-26 DIAGNOSIS — D469 Myelodysplastic syndrome, unspecified: Secondary | ICD-10-CM | POA: Diagnosis not present

## 2019-03-26 DIAGNOSIS — N183 Chronic kidney disease, stage 3 (moderate): Secondary | ICD-10-CM | POA: Diagnosis not present

## 2019-03-26 DIAGNOSIS — M150 Primary generalized (osteo)arthritis: Secondary | ICD-10-CM | POA: Diagnosis not present

## 2019-03-26 DIAGNOSIS — Z7901 Long term (current) use of anticoagulants: Secondary | ICD-10-CM | POA: Diagnosis not present

## 2019-03-26 DIAGNOSIS — Z79891 Long term (current) use of opiate analgesic: Secondary | ICD-10-CM | POA: Diagnosis not present

## 2019-03-26 DIAGNOSIS — I129 Hypertensive chronic kidney disease with stage 1 through stage 4 chronic kidney disease, or unspecified chronic kidney disease: Secondary | ICD-10-CM | POA: Diagnosis not present

## 2019-03-26 DIAGNOSIS — Z86718 Personal history of other venous thrombosis and embolism: Secondary | ICD-10-CM | POA: Diagnosis not present

## 2019-03-26 DIAGNOSIS — Z95820 Peripheral vascular angioplasty status with implants and grafts: Secondary | ICD-10-CM | POA: Diagnosis not present

## 2019-03-26 DIAGNOSIS — Z5181 Encounter for therapeutic drug level monitoring: Secondary | ICD-10-CM | POA: Diagnosis not present

## 2019-03-26 DIAGNOSIS — M1991 Primary osteoarthritis, unspecified site: Secondary | ICD-10-CM | POA: Diagnosis not present

## 2019-03-26 DIAGNOSIS — Z792 Long term (current) use of antibiotics: Secondary | ICD-10-CM | POA: Diagnosis not present

## 2019-03-26 DIAGNOSIS — G8929 Other chronic pain: Secondary | ICD-10-CM | POA: Diagnosis not present

## 2019-03-26 DIAGNOSIS — M109 Gout, unspecified: Secondary | ICD-10-CM | POA: Diagnosis not present

## 2019-03-26 DIAGNOSIS — G729 Myopathy, unspecified: Secondary | ICD-10-CM | POA: Diagnosis not present

## 2019-03-26 DIAGNOSIS — I480 Paroxysmal atrial fibrillation: Secondary | ICD-10-CM | POA: Diagnosis not present

## 2019-03-26 DIAGNOSIS — Z85831 Personal history of malignant neoplasm of soft tissue: Secondary | ICD-10-CM | POA: Diagnosis not present

## 2019-03-28 ENCOUNTER — Other Ambulatory Visit: Payer: Self-pay | Admitting: Osteopathic Medicine

## 2019-03-28 DIAGNOSIS — M5136 Other intervertebral disc degeneration, lumbar region: Secondary | ICD-10-CM

## 2019-03-28 DIAGNOSIS — G8928 Other chronic postprocedural pain: Secondary | ICD-10-CM

## 2019-03-28 NOTE — Telephone Encounter (Signed)
Requested medications are due for refill today?  Yes  Requested medications are on the active medication list?  Yes  Last refill -02/26/2019  Future visit scheduled?  Yes  Notes to clinic   Requested Prescriptions  Pending Prescriptions Disp Refills   HYDROcodone-acetaminophen ([place]) 7.5-325 MG tablet [Pharmacy Med Name: hydrocodone 7.5 mg-acetaminophen 325 mg tablet] 60 tablet 0    Sig: TAKE ONE TABLET BY MOUTH EVERY 6 HOURS AS NEEDED FOR MODERATE OR SEVERE PAIN     Not Delegated - Analgesics:  Opioid Agonist Combinations Failed - 03/28/2019 11:20 AM      Failed - This refill cannot be delegated      Failed - Urine Drug Screen completed in last 360 days.      Failed - Valid encounter within last 6 months    Recent Outpatient Visits          1 month ago Other chronic postprocedural pain   [place] [facility], [place], DO   3 months ago Traumatic seroma of lower leg, right, initial encounter [facility])   [place] [facility], Rebekah [place], MD   4 months ago Paroxysmal atrial fibrillation [facility])   [place] [facility], Lanelle Bal, DO   6 months ago Drug-induced fever   [place] [facility], Lanelle Bal, DO   7 months ago Accidental fall, subsequent encounter   [place] [facility], Gwen Her, MD      Future Appointments            In 3 months [place] [facility]            Signed Prescriptions Disp Refills   carvedilol (COREG) 25 MG tablet 180 tablet 0    Sig: Take 1 tablet (25 mg total) by mouth 2 (two) times daily with a meal.     Cardiovascular:  Beta Blockers Failed - 03/28/2019 11:20 AM      Failed - Last BP in normal range    BP Readings from Last 1 Encounters:  03/19/19 (!) 150/79         Failed - Valid encounter within last 6 months    Recent Outpatient Visits      1 month ago Other chronic postprocedural pain   [place] [facility], Lanelle Bal, DO   3 months ago Traumatic seroma of lower leg, right, initial encounter [facility])   [place] [facility], Rebekah [place], MD   4 months ago Paroxysmal atrial fibrillation [facility])   [place] [facility], Lanelle Bal, DO   6 months ago Drug-induced fever   [place] [facility], Lanelle Bal, DO   7 months ago Accidental fall, subsequent encounter   [place] [facility], Gwen Her, MD      Future Appointments            In 3 months  [facility] [place] in normal range    Pulse Readings from Last 1 Encounters:  03/19/19 (!) 58

## 2019-04-05 DIAGNOSIS — M109 Gout, unspecified: Secondary | ICD-10-CM | POA: Diagnosis not present

## 2019-04-05 DIAGNOSIS — I129 Hypertensive chronic kidney disease with stage 1 through stage 4 chronic kidney disease, or unspecified chronic kidney disease: Secondary | ICD-10-CM | POA: Diagnosis not present

## 2019-04-05 DIAGNOSIS — I480 Paroxysmal atrial fibrillation: Secondary | ICD-10-CM | POA: Diagnosis not present

## 2019-04-05 DIAGNOSIS — G8929 Other chronic pain: Secondary | ICD-10-CM | POA: Diagnosis not present

## 2019-04-05 DIAGNOSIS — M150 Primary generalized (osteo)arthritis: Secondary | ICD-10-CM | POA: Diagnosis not present

## 2019-04-05 DIAGNOSIS — N183 Chronic kidney disease, stage 3 (moderate): Secondary | ICD-10-CM | POA: Diagnosis not present

## 2019-04-06 ENCOUNTER — Telehealth: Payer: Self-pay

## 2019-04-06 NOTE — Telephone Encounter (Signed)
Left a detailed vm msg for RN [place] regarding provider's recommendation. Direct call back info provided.

## 2019-04-06 NOTE — Telephone Encounter (Signed)
INR at goal but has been all over the place in the past Let's plan to keep warfarin dose same, reepat INR one week

## 2019-04-06 NOTE — Telephone Encounter (Signed)
RN [place] left a vm msg stating that pt had INR check on 04/05/2019. Pt's reading was:  PT: 25.3 IRN: 2.1  Pt is currently taking warfarin as:   Mon-Sat 6 mg Sun - 9 mg  Should pt continue taking current regimen or will provider adjust daily intake of warfarin? Pls advise, thanks.

## 2019-04-09 DIAGNOSIS — E870 Hyperosmolality and hypernatremia: Secondary | ICD-10-CM | POA: Diagnosis not present

## 2019-04-09 DIAGNOSIS — I10 Essential (primary) hypertension: Secondary | ICD-10-CM | POA: Diagnosis not present

## 2019-04-09 DIAGNOSIS — N184 Chronic kidney disease, stage 4 (severe): Secondary | ICD-10-CM | POA: Diagnosis not present

## 2019-04-20 DIAGNOSIS — M150 Primary generalized (osteo)arthritis: Secondary | ICD-10-CM | POA: Diagnosis not present

## 2019-04-20 DIAGNOSIS — I129 Hypertensive chronic kidney disease with stage 1 through stage 4 chronic kidney disease, or unspecified chronic kidney disease: Secondary | ICD-10-CM | POA: Diagnosis not present

## 2019-04-20 DIAGNOSIS — I480 Paroxysmal atrial fibrillation: Secondary | ICD-10-CM | POA: Diagnosis not present

## 2019-04-20 DIAGNOSIS — N183 Chronic kidney disease, stage 3 (moderate): Secondary | ICD-10-CM | POA: Diagnosis not present

## 2019-04-20 DIAGNOSIS — M109 Gout, unspecified: Secondary | ICD-10-CM | POA: Diagnosis not present

## 2019-04-20 DIAGNOSIS — G8929 Other chronic pain: Secondary | ICD-10-CM | POA: Diagnosis not present

## 2019-04-20 LAB — POCT INR: INR: 2.6 — AB (ref 0.9–1.1)

## 2019-04-23 ENCOUNTER — Telehealth: Payer: Self-pay

## 2019-04-23 NOTE — Telephone Encounter (Signed)
Reneka from [facility] called with INR results. Abstracted.    INR - 2.6

## 2019-04-23 NOTE — Telephone Encounter (Signed)
INR at goal, continue current Coumadin dosing and plan to recheck in 1 week.

## 2019-04-24 ENCOUNTER — Other Ambulatory Visit: Payer: Self-pay | Admitting: Osteopathic Medicine

## 2019-04-24 NOTE — Telephone Encounter (Signed)
Spoke to patient gave her advise as noted below. Rhonda Cunningham,CMA  

## 2019-04-25 ENCOUNTER — Other Ambulatory Visit: Payer: Self-pay | Admitting: Osteopathic Medicine

## 2019-04-25 DIAGNOSIS — M109 Gout, unspecified: Secondary | ICD-10-CM | POA: Diagnosis not present

## 2019-04-25 DIAGNOSIS — G8929 Other chronic pain: Secondary | ICD-10-CM | POA: Diagnosis not present

## 2019-04-25 DIAGNOSIS — Z7901 Long term (current) use of anticoagulants: Secondary | ICD-10-CM | POA: Diagnosis not present

## 2019-04-25 DIAGNOSIS — Z95820 Peripheral vascular angioplasty status with implants and grafts: Secondary | ICD-10-CM | POA: Diagnosis not present

## 2019-04-25 DIAGNOSIS — I480 Paroxysmal atrial fibrillation: Secondary | ICD-10-CM | POA: Diagnosis not present

## 2019-04-25 DIAGNOSIS — G729 Myopathy, unspecified: Secondary | ICD-10-CM | POA: Diagnosis not present

## 2019-04-25 DIAGNOSIS — Z5181 Encounter for therapeutic drug level monitoring: Secondary | ICD-10-CM | POA: Diagnosis not present

## 2019-04-25 DIAGNOSIS — G8928 Other chronic postprocedural pain: Secondary | ICD-10-CM

## 2019-04-25 DIAGNOSIS — D469 Myelodysplastic syndrome, unspecified: Secondary | ICD-10-CM | POA: Diagnosis not present

## 2019-04-25 DIAGNOSIS — Z792 Long term (current) use of antibiotics: Secondary | ICD-10-CM | POA: Diagnosis not present

## 2019-04-25 DIAGNOSIS — I129 Hypertensive chronic kidney disease with stage 1 through stage 4 chronic kidney disease, or unspecified chronic kidney disease: Secondary | ICD-10-CM | POA: Diagnosis not present

## 2019-04-25 DIAGNOSIS — N183 Chronic kidney disease, stage 3 (moderate): Secondary | ICD-10-CM | POA: Diagnosis not present

## 2019-04-25 DIAGNOSIS — Z79891 Long term (current) use of opiate analgesic: Secondary | ICD-10-CM | POA: Diagnosis not present

## 2019-04-25 DIAGNOSIS — M5136 Other intervertebral disc degeneration, lumbar region: Secondary | ICD-10-CM

## 2019-04-25 DIAGNOSIS — Z85831 Personal history of malignant neoplasm of soft tissue: Secondary | ICD-10-CM | POA: Diagnosis not present

## 2019-04-25 DIAGNOSIS — Z86718 Personal history of other venous thrombosis and embolism: Secondary | ICD-10-CM | POA: Diagnosis not present

## 2019-04-25 DIAGNOSIS — M150 Primary generalized (osteo)arthritis: Secondary | ICD-10-CM | POA: Diagnosis not present

## 2019-04-25 DIAGNOSIS — M1991 Primary osteoarthritis, unspecified site: Secondary | ICD-10-CM | POA: Diagnosis not present

## 2019-04-26 ENCOUNTER — Telehealth: Payer: Self-pay

## 2019-04-26 ENCOUNTER — Ambulatory Visit (INDEPENDENT_AMBULATORY_CARE_PROVIDER_SITE_OTHER): Payer: Medicare Other | Admitting: Licensed Clinical Social Worker

## 2019-04-26 DIAGNOSIS — N183 Chronic kidney disease, stage 3 (moderate): Secondary | ICD-10-CM | POA: Diagnosis not present

## 2019-04-26 DIAGNOSIS — G8929 Other chronic pain: Secondary | ICD-10-CM | POA: Diagnosis not present

## 2019-04-26 DIAGNOSIS — M109 Gout, unspecified: Secondary | ICD-10-CM | POA: Diagnosis not present

## 2019-04-26 DIAGNOSIS — F4322 Adjustment disorder with anxiety: Secondary | ICD-10-CM | POA: Diagnosis not present

## 2019-04-26 DIAGNOSIS — M150 Primary generalized (osteo)arthritis: Secondary | ICD-10-CM | POA: Diagnosis not present

## 2019-04-26 DIAGNOSIS — I480 Paroxysmal atrial fibrillation: Secondary | ICD-10-CM | POA: Diagnosis not present

## 2019-04-26 DIAGNOSIS — I129 Hypertensive chronic kidney disease with stage 1 through stage 4 chronic kidney disease, or unspecified chronic kidney disease: Secondary | ICD-10-CM | POA: Diagnosis not present
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
## 2019-04-26 NOTE — Telephone Encounter (Signed)
Gregary Signs, nurse with [facility], called to let us know pt's INR was 1.7 Patient taking 6 mg daily. Only change reported is she had a spinach souffle   Gwinda Passe #  [phone]

## 2019-04-27 DIAGNOSIS — Z1231 Encounter for screening mammogram for malignant neoplasm of breast: Secondary | ICD-10-CM | POA: Diagnosis not present

## 2019-04-27 LAB — HM MAMMOGRAPHY

## 2019-04-27 NOTE — Telephone Encounter (Signed)
Can continue 6 mg daily except 1.5 tablets (9 mg total) on Saturday, recheck in one week

## 2019-04-27 NOTE — Telephone Encounter (Signed)
Called and LM on Julia VM of instructions on Coumadin and called patient and let her know as well. KG LPN

## 2019-05-01 DIAGNOSIS — D46C Myelodysplastic syndrome with isolated del(5q) chromosomal abnormality: Secondary | ICD-10-CM | POA: Diagnosis not present

## 2019-05-01 DIAGNOSIS — D472 Monoclonal gammopathy: Secondary | ICD-10-CM | POA: Diagnosis not present

## 2019-05-03 ENCOUNTER — Telehealth: Payer: Self-pay

## 2019-05-03 DIAGNOSIS — M109 Gout, unspecified: Secondary | ICD-10-CM | POA: Diagnosis not present

## 2019-05-03 DIAGNOSIS — N183 Chronic kidney disease, stage 3 (moderate): Secondary | ICD-10-CM | POA: Diagnosis not present

## 2019-05-03 DIAGNOSIS — I480 Paroxysmal atrial fibrillation: Secondary | ICD-10-CM | POA: Diagnosis not present

## 2019-05-03 DIAGNOSIS — M150 Primary generalized (osteo)arthritis: Secondary | ICD-10-CM | POA: Diagnosis not present

## 2019-05-03 DIAGNOSIS — G8929 Other chronic pain: Secondary | ICD-10-CM | POA: Diagnosis not present

## 2019-05-03 DIAGNOSIS — I129 Hypertensive chronic kidney disease with stage 1 through stage 4 chronic kidney disease, or unspecified chronic kidney disease: Secondary | ICD-10-CM | POA: Diagnosis not present

## 2019-05-03 NOTE — Telephone Encounter (Signed)
Joya with [facility] called with INR results.    INR 1.8

## 2019-05-03 NOTE — Telephone Encounter (Signed)
Pt advised. Left msg for [facility] nurse to call back

## 2019-05-03 NOTE — Telephone Encounter (Signed)
Can continue 6 mg daily except 1.5 tablets (9 mg total) on Saturday & Tuesday, recheck in one week

## 2019-05-04 NOTE — Telephone Encounter (Signed)
Nurse advised.  

## 2019-05-10 DIAGNOSIS — M109 Gout, unspecified: Secondary | ICD-10-CM | POA: Diagnosis not present

## 2019-05-10 DIAGNOSIS — G8929 Other chronic pain: Secondary | ICD-10-CM | POA: Diagnosis not present

## 2019-05-10 DIAGNOSIS — I129 Hypertensive chronic kidney disease with stage 1 through stage 4 chronic kidney disease, or unspecified chronic kidney disease: Secondary | ICD-10-CM | POA: Diagnosis not present

## 2019-05-10 DIAGNOSIS — I480 Paroxysmal atrial fibrillation: Secondary | ICD-10-CM | POA: Diagnosis not present

## 2019-05-10 DIAGNOSIS — N183 Chronic kidney disease, stage 3 (moderate): Secondary | ICD-10-CM | POA: Diagnosis not present

## 2019-05-10 DIAGNOSIS — M150 Primary generalized (osteo)arthritis: Secondary | ICD-10-CM | POA: Diagnosis not present

## 2019-05-11 ENCOUNTER — Telehealth: Payer: Self-pay

## 2019-05-11 NOTE — Telephone Encounter (Signed)
RN Amy from [facility] called with pt's INR results. As per Amy, INR results was 2.8 on 05/10/19. Does provider want pt to continue taking warfarin as directed? Pls advise, thanks.

## 2019-05-11 NOTE — Telephone Encounter (Signed)
Yes, continue warfarin as currently dosed and plan to monitor weekly (can they check on Thursdays so I can be sure to see the results before the weekend?)

## 2019-05-11 NOTE — Telephone Encounter (Signed)
Left a detailed vm msg for RN Amy regarding provider's note / recommendations for pt's warfarin therapy. Direct call back info provided.

## 2019-05-15 DIAGNOSIS — G43819 Other migraine, intractable, without status migrainosus: Secondary | ICD-10-CM | POA: Diagnosis not present

## 2019-05-15 DIAGNOSIS — I10 Essential (primary) hypertension: Secondary | ICD-10-CM | POA: Diagnosis not present

## 2019-05-15 DIAGNOSIS — R419 Unspecified symptoms and signs involving cognitive functions and awareness: Secondary | ICD-10-CM | POA: Diagnosis not present

## 2019-05-21 ENCOUNTER — Ambulatory Visit: Payer: Medicare Other | Admitting: Obstetrics & Gynecology

## 2019-05-23 ENCOUNTER — Telehealth: Payer: Self-pay

## 2019-05-23 NOTE — Telephone Encounter (Signed)
RN Amy from [facility] called requesting confirmation to proceed with pt's weekly INR check. Informed nurse to proceed with at home testing and to return a call back to clinic with results. Direct call back info provided. No other inquiries during call.

## 2019-05-24 ENCOUNTER — Other Ambulatory Visit: Payer: Self-pay | Admitting: Osteopathic Medicine

## 2019-05-24 DIAGNOSIS — I129 Hypertensive chronic kidney disease with stage 1 through stage 4 chronic kidney disease, or unspecified chronic kidney disease: Secondary | ICD-10-CM | POA: Diagnosis not present

## 2019-05-24 DIAGNOSIS — M5136 Other intervertebral disc degeneration, lumbar region: Secondary | ICD-10-CM

## 2019-05-24 DIAGNOSIS — N183 Chronic kidney disease, stage 3 (moderate): Secondary | ICD-10-CM | POA: Diagnosis not present

## 2019-05-24 DIAGNOSIS — I480 Paroxysmal atrial fibrillation: Secondary | ICD-10-CM | POA: Diagnosis not present

## 2019-05-24 DIAGNOSIS — M109 Gout, unspecified: Secondary | ICD-10-CM | POA: Diagnosis not present

## 2019-05-24 DIAGNOSIS — G8929 Other chronic pain: Secondary | ICD-10-CM | POA: Diagnosis not present

## 2019-05-24 DIAGNOSIS — M150 Primary generalized (osteo)arthritis: Secondary | ICD-10-CM | POA: Diagnosis not present

## 2019-05-24 DIAGNOSIS — G8928 Other chronic postprocedural pain: Secondary | ICD-10-CM

## 2019-05-24 NOTE — Telephone Encounter (Signed)
Requested medication (s) are due for refill today: yes  Requested medication (s) are on the active medication list: yes  Last refill:  04/27/2019  Future visit scheduled: yes  Notes to clinic:  Refill cannot be delegated   Requested Prescriptions  Pending Prescriptions Disp Refills   HYDROcodone-acetaminophen ([place]) 7.5-325 MG tablet [Pharmacy Med Name: hydrocodone 7.5 mg-acetaminophen 325 mg tablet] 60 tablet 0    Sig: TAKE ONE TABLET BY MOUTH EVERY 6 HOURS AS NEEDED FOR MODERATE OR SEVERE PAIN     Not Delegated - Analgesics:  Opioid Agonist Combinations Failed - 05/24/2019 10:11 AM      Failed - This refill cannot be delegated      Failed - Urine Drug Screen completed in last 360 days.      Failed - Valid encounter within last 6 months    Recent Outpatient Visits          3 months ago Other chronic postprocedural pain   [facility], [place], DO   5 months ago Traumatic seroma of lower leg, right, initial encounter [facility])   [facility], Rebekah [place], MD   6 months ago Paroxysmal atrial fibrillation [facility])   [facility], Lanelle Bal, DO   8 months ago Drug-induced fever   [facility], Rea, DO   9 months ago Accidental fall, subsequent encounter   [facility], Gwen Her, MD      Future Appointments            In 1 month [place]

## 2019-05-24 NOTE — Telephone Encounter (Signed)
[place] requesting med refill for hydrocodone-ace.

## 2019-05-25 ENCOUNTER — Telehealth: Payer: Self-pay

## 2019-05-25 DIAGNOSIS — G729 Myopathy, unspecified: Secondary | ICD-10-CM | POA: Diagnosis not present

## 2019-05-25 DIAGNOSIS — Z95820 Peripheral vascular angioplasty status with implants and grafts: Secondary | ICD-10-CM | POA: Diagnosis not present

## 2019-05-25 DIAGNOSIS — M109 Gout, unspecified: Secondary | ICD-10-CM | POA: Diagnosis not present

## 2019-05-25 DIAGNOSIS — Z86718 Personal history of other venous thrombosis and embolism: Secondary | ICD-10-CM | POA: Diagnosis not present

## 2019-05-25 DIAGNOSIS — Z792 Long term (current) use of antibiotics: Secondary | ICD-10-CM | POA: Diagnosis not present

## 2019-05-25 DIAGNOSIS — M1991 Primary osteoarthritis, unspecified site: Secondary | ICD-10-CM | POA: Diagnosis not present

## 2019-05-25 DIAGNOSIS — Z85831 Personal history of malignant neoplasm of soft tissue: Secondary | ICD-10-CM | POA: Diagnosis not present

## 2019-05-25 DIAGNOSIS — M150 Primary generalized (osteo)arthritis: Secondary | ICD-10-CM | POA: Diagnosis not present

## 2019-05-25 DIAGNOSIS — Z79891 Long term (current) use of opiate analgesic: Secondary | ICD-10-CM | POA: Diagnosis not present

## 2019-05-25 DIAGNOSIS — Z7901 Long term (current) use of anticoagulants: Secondary | ICD-10-CM | POA: Diagnosis not present

## 2019-05-25 DIAGNOSIS — G8929 Other chronic pain: Secondary | ICD-10-CM | POA: Diagnosis not present

## 2019-05-25 DIAGNOSIS — I129 Hypertensive chronic kidney disease with stage 1 through stage 4 chronic kidney disease, or unspecified chronic kidney disease: Secondary | ICD-10-CM | POA: Diagnosis not present

## 2019-05-25 DIAGNOSIS — Z5181 Encounter for therapeutic drug level monitoring: Secondary | ICD-10-CM | POA: Diagnosis not present

## 2019-05-25 DIAGNOSIS — I480 Paroxysmal atrial fibrillation: Secondary | ICD-10-CM | POA: Diagnosis not present

## 2019-05-25 DIAGNOSIS — D469 Myelodysplastic syndrome, unspecified: Secondary | ICD-10-CM | POA: Diagnosis not present

## 2019-05-25 DIAGNOSIS — N183 Chronic kidney disease, stage 3 (moderate): Secondary | ICD-10-CM | POA: Diagnosis not present

## 2019-05-25 NOTE — Telephone Encounter (Signed)
RN Amy from [facility] called with pt's INR reading done today. As per RN :   INR: 3.2 PT: 35.4  Amy wants to know if pt continue current dosing or any adjustments required? Does provider want pt to have home INR testing in 1 week? Pls advise, thanks.

## 2019-05-25 NOTE — Telephone Encounter (Signed)
Contacted RN Amy - informed her of covering provider's recommendation. Per Dr. Redgie Grayer note on 05/03/19, pt will continue taking 6 mg daily, except 1.5 tablets (9 mg total) on Saturday & Tuesday. Pt's INR will be recheck in 1 week. No other inquiries during call.

## 2019-05-25 NOTE — Telephone Encounter (Signed)
It was normal at the last check,Recheck in 1 week.

## 2019-05-28 ENCOUNTER — Ambulatory Visit (INDEPENDENT_AMBULATORY_CARE_PROVIDER_SITE_OTHER): Payer: Medicare Other | Admitting: Licensed Clinical Social Worker

## 2019-05-28 DIAGNOSIS — F4322 Adjustment disorder with anxiety: Secondary | ICD-10-CM | POA: Diagnosis not present
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
## 2019-05-31 ENCOUNTER — Telehealth: Payer: Self-pay

## 2019-05-31 DIAGNOSIS — I480 Paroxysmal atrial fibrillation: Secondary | ICD-10-CM

## 2019-05-31 DIAGNOSIS — I129 Hypertensive chronic kidney disease with stage 1 through stage 4 chronic kidney disease, or unspecified chronic kidney disease: Secondary | ICD-10-CM | POA: Diagnosis not present

## 2019-05-31 DIAGNOSIS — Z7901 Long term (current) use of anticoagulants: Secondary | ICD-10-CM

## 2019-05-31 DIAGNOSIS — M109 Gout, unspecified: Secondary | ICD-10-CM | POA: Diagnosis not present

## 2019-05-31 DIAGNOSIS — D6859 Other primary thrombophilia: Secondary | ICD-10-CM

## 2019-05-31 DIAGNOSIS — M150 Primary generalized (osteo)arthritis: Secondary | ICD-10-CM | POA: Diagnosis not present

## 2019-05-31 DIAGNOSIS — N183 Chronic kidney disease, stage 3 (moderate): Secondary | ICD-10-CM | POA: Diagnosis not present

## 2019-05-31 DIAGNOSIS — G8929 Other chronic pain: Secondary | ICD-10-CM | POA: Diagnosis not present

## 2019-05-31 LAB — POCT INR: INR: 2.2 — AB (ref 0.9–1.1)

## 2019-05-31 NOTE — Telephone Encounter (Signed)
[place] with [facility] called and left a message with INR results.   INR - 2.2 PT  26.4  Abstracted INR

## 2019-06-01 NOTE — Telephone Encounter (Signed)
Continue current regimen.  Repeat INR in 2 weeks.  Please see if patient has had her flu vaccine if not please offer and if she declines or plans on going elsewhere then please mark chart

## 2019-06-01 NOTE — Telephone Encounter (Signed)
Left pt msg advising of recommendations on patients #.   Verbal given to [place] with [facility].   Asked pt call back concerning flu shot

## 2019-06-14 ENCOUNTER — Other Ambulatory Visit: Payer: Self-pay | Admitting: Family Medicine

## 2019-06-14 ENCOUNTER — Ambulatory Visit: Payer: Medicare Other

## 2019-06-14 NOTE — Telephone Encounter (Signed)
Does patient still take 4mg  tabs? It looks like she just uses 6mg  tabs now, please advise

## 2019-06-14 NOTE — Telephone Encounter (Signed)
Not sure, I though home health was checking her INR? Can we call patient and confirm current dose?

## 2019-06-15 ENCOUNTER — Ambulatory Visit (INDEPENDENT_AMBULATORY_CARE_PROVIDER_SITE_OTHER): Payer: Medicare Other | Admitting: Osteopathic Medicine

## 2019-06-15 ENCOUNTER — Other Ambulatory Visit: Payer: Self-pay

## 2019-06-15 DIAGNOSIS — Z23 Encounter for immunization: Secondary | ICD-10-CM

## 2019-06-15 NOTE — Telephone Encounter (Signed)
Spoke with Pt. She is taking 6 mg all days except Sat/Thurs when she takes 9mg . Will send over Rx for 4mg  (she adds to her 6mg  tab to equal 9mg ).   FYI - Pt is on wait list for new independent living facility. Nothing needed at this time.

## 2019-06-18 ENCOUNTER — Telehealth: Payer: Self-pay | Admitting: Osteopathic Medicine

## 2019-06-18 NOTE — Telephone Encounter (Signed)
Continue current dose (please confirm dose - ask nurses to do this when they call to report INR) and recheck INR one week

## 2019-06-18 NOTE — Telephone Encounter (Signed)
[place] with [facility] left a message that the patients INR was 2.9. Her PT was 33.0.  Please advise on any changes. Thanks

## 2019-06-19 NOTE — Telephone Encounter (Signed)
Patient advised. She is taking 6 mg daily except 9 mg on Tuesday and Saturday.   Left message with Home Health advising of recommendations.

## 2019-06-22 ENCOUNTER — Other Ambulatory Visit: Payer: Self-pay | Admitting: Family Medicine

## 2019-06-22 ENCOUNTER — Telehealth: Payer: Self-pay

## 2019-06-22 DIAGNOSIS — M5136 Other intervertebral disc degeneration, lumbar region: Secondary | ICD-10-CM

## 2019-06-22 DIAGNOSIS — G8928 Other chronic postprocedural pain: Secondary | ICD-10-CM

## 2019-06-22 NOTE — Telephone Encounter (Signed)
RN [place] from [facility] called stating that pt's weekly INR check for today is:  PT 38.0 INR 3.3  Pt currently taking 6 mg Mon/Wed/Fri/Sat and 9 mg Sun/Tues. Should pt continue with current coumadin therapy with weekly check or does provider need to make any adjustments?  Pls advise, thanks.

## 2019-06-24 DIAGNOSIS — G729 Myopathy, unspecified: Secondary | ICD-10-CM | POA: Diagnosis not present

## 2019-06-24 DIAGNOSIS — Z79891 Long term (current) use of opiate analgesic: Secondary | ICD-10-CM | POA: Diagnosis not present

## 2019-06-24 DIAGNOSIS — Z85831 Personal history of malignant neoplasm of soft tissue: Secondary | ICD-10-CM | POA: Diagnosis not present

## 2019-06-24 DIAGNOSIS — I129 Hypertensive chronic kidney disease with stage 1 through stage 4 chronic kidney disease, or unspecified chronic kidney disease: Secondary | ICD-10-CM | POA: Diagnosis not present

## 2019-06-24 DIAGNOSIS — Z5181 Encounter for therapeutic drug level monitoring: Secondary | ICD-10-CM | POA: Diagnosis not present

## 2019-06-24 DIAGNOSIS — M150 Primary generalized (osteo)arthritis: Secondary | ICD-10-CM | POA: Diagnosis not present

## 2019-06-24 DIAGNOSIS — Z7901 Long term (current) use of anticoagulants: Secondary | ICD-10-CM | POA: Diagnosis not present

## 2019-06-24 DIAGNOSIS — M1991 Primary osteoarthritis, unspecified site: Secondary | ICD-10-CM | POA: Diagnosis not present

## 2019-06-24 DIAGNOSIS — Z86718 Personal history of other venous thrombosis and embolism: Secondary | ICD-10-CM | POA: Diagnosis not present

## 2019-06-24 DIAGNOSIS — Z95820 Peripheral vascular angioplasty status with implants and grafts: Secondary | ICD-10-CM | POA: Diagnosis not present

## 2019-06-24 DIAGNOSIS — Z792 Long term (current) use of antibiotics: Secondary | ICD-10-CM | POA: Diagnosis not present

## 2019-06-24 DIAGNOSIS — N183 Chronic kidney disease, stage 3 unspecified: Secondary | ICD-10-CM | POA: Diagnosis not present

## 2019-06-24 DIAGNOSIS — M109 Gout, unspecified: Secondary | ICD-10-CM | POA: Diagnosis not present

## 2019-06-24 DIAGNOSIS — I480 Paroxysmal atrial fibrillation: Secondary | ICD-10-CM | POA: Diagnosis not present

## 2019-06-24 DIAGNOSIS — G8929 Other chronic pain: Secondary | ICD-10-CM | POA: Diagnosis not present

## 2019-06-24 DIAGNOSIS — D469 Myelodysplastic syndrome, unspecified: Secondary | ICD-10-CM | POA: Diagnosis not present

## 2019-06-25 ENCOUNTER — Other Ambulatory Visit: Payer: Self-pay

## 2019-06-25 ENCOUNTER — Ambulatory Visit (INDEPENDENT_AMBULATORY_CARE_PROVIDER_SITE_OTHER): Payer: Medicare Other | Admitting: Licensed Clinical Social Worker

## 2019-06-25 DIAGNOSIS — F4322 Adjustment disorder with anxiety: Secondary | ICD-10-CM

## 2019-06-25 NOTE — Telephone Encounter (Signed)
Would probably decrease to 6 mg except for Sundays.  Recheck INR in 1 week.

## 2019-06-25 NOTE — Telephone Encounter (Signed)
Patient advised.

## 2019-06-25 NOTE — Progress Notes (Signed)
Virtual Visit via Telephone Note  I connected with Gloria Mckay on 06/25/19 at  1:00 PM EDT by telephone and verified that I am speaking with the correct person using two identifiers.   I discussed the limitations, risks, security and privacy concerns of performing an evaluation and management service by telephone and the availability of in person appointments. I also discussed with the patient that there may be a patient responsible charge related to this service. The patient expressed understanding and agreed to proceed.  Follow Up Instructions:    I discussed the assessment and treatment plan with the patient. The patient was provided an opportunity to ask questions and all were answered. The patient agreed with the plan and demonstrated an understanding of the instructions.   The patient was advised to call back or seek an in-person evaluation if the symptoms worsen or if the condition fails to improve as anticipated.  I provided 55 minutes of non-face-to-face time during this encounter.  THERAPIST PROGRESS NOTE  Session Time: 1:01 Pm to 1:56 PM  Participation Level: Active  Behavioral Response: AlertAnxious  Type of Therapy: Individual Therapy  Treatment Goals addressed:  Anxiety, stress management, depession  Interventions: Motivational Interviewing, Strength-based, Supportive, Reframing and Other: coping  Summary: Gloria Mckay is a 78 y.o. femalewho updated therapist about stressors related to current living situation. Talked to girls (who also live at independent living housing) yesterday and got feedback from them. Not going too well. It is like a rock and a hard place. Don't know which way to go. Just called the [facility] and left a message. They have cut back on staff so hopefully they will call back and give me advice. [facility] busy with getting that situated of getting people to come in person. Can call minister at home. 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 They have clicks where she lives and this person  has her own click. Describes her as the "Other end of the stick". Seems to be ok with me. Whole different personality than other ladies. Cautious before jump into it. This manager is a tough cookie. If she finds out that you are in any way trying to do something there are consequences. Another lady who complained was threatened with eviction. If say too much she will write you up and then when three write ups will evict. It is scary. Better off than a lot of people, that is true and think that often. What right have I do to complain. Therapist pointed out feelings valid. Patient is paying rent on time and independent living situaiton and not a nursing home. Manager treats it like a nursing home situation and rights away from [place]. Daughter wants to help but pull her back could make it worse. News said nursing homes opening up and families can come. No reason for family to stay away. Manager set policy that could come but had to meet in parking lot. Discussed this as excessive, not reasonable to expect residents to meet family in parking lot. Daughter has been coming and they shouldn't say anything.  Patient describes has degenerative bone disease very limited in her ability to clean and do housework.  Daughter has come to help but forced to do some of this work and caused her health to deteriorate.  Her medical team has already said family  needs to be involved to help out with her care. Hoping for another place. Therapist pointed out would be a benefit for her  mental health. This has been so stressful. Patient describes that there has been a lot of stress at one time, not stretched out. Just come out of chemotherapy and that messed me up terribly. I think it affects your brain, you thinking, your focus. I  see as I get along I am getting better. For while it seemed I was stuck with it. I am seeing better days. Tests I can take for short-term memory and age doesn't help. Go for well check in November. I don't think I am going to go to neurologist. Really likes her providers and they have said that they can help her with that. .Want to do something to keep busy, not easy blood work not good and tired. I guess I have to push. Patient shares that try to hang on to what we talked about and try to work on solving problem.   Therapist assessed patient current functioning per report and processed feelings related to ongoing stressors.  Continue to help patient in problem solving related to main stressor of living situation and reviewing pros and cons of moving forward with an option.  Issue involves concerns of negative consequences for her living situation if she decides to take action. Reviewed Legal Aid and at least she would know her legal rights. Processed patient's feelings related to restrictive and intimidating environment fear being a component of the situation.  Therapist reviewed treatment plan patient agreed to complete virtually.  Focuses on helping patient cope through stressors that happened all at one time, providing supportive interventions.  Important as well to set objectives addressing anxiety and depression.  Reviewed insight with patient that resolving issues often happens over time that things do not get resolve right away as a way life often works in developing attitude such as acceptance and patience help with that process of coping.  Discussed attitudes of gratitude help with dealing with stressor and looking all things she does have.  Looked at positive situation she has with providers who are very helpful to her.  Utilize reframing to look at positive aspects of community that she has enjoyed interacting with some of the people. Therapist encouraged patient engaging in activity to keep busy will  help with mood, also compassionate attitude of doing what she can, not pushing beyond her limits. Provided strength based and supportive intervention.  Suicidal/Homicidal: No  Plan: Return again in 4 weeks.2.anxiety stress management  Diagnosis: Axis I:   adjustment disorder with anxious mood     Axis II: No diagnosis    Cordella Register, LCSW 06/25/2019

## 2019-06-27 DIAGNOSIS — M109 Gout, unspecified: Secondary | ICD-10-CM | POA: Diagnosis not present

## 2019-06-27 DIAGNOSIS — I480 Paroxysmal atrial fibrillation: Secondary | ICD-10-CM | POA: Diagnosis not present

## 2019-06-27 DIAGNOSIS — I129 Hypertensive chronic kidney disease with stage 1 through stage 4 chronic kidney disease, or unspecified chronic kidney disease: Secondary | ICD-10-CM | POA: Diagnosis not present

## 2019-06-27 DIAGNOSIS — G8929 Other chronic pain: Secondary | ICD-10-CM | POA: Diagnosis not present

## 2019-06-27 DIAGNOSIS — M150 Primary generalized (osteo)arthritis: Secondary | ICD-10-CM | POA: Diagnosis not present

## 2019-06-27 DIAGNOSIS — N183 Chronic kidney disease, stage 3 unspecified: Secondary | ICD-10-CM | POA: Diagnosis not present

## 2019-06-29 ENCOUNTER — Telehealth: Payer: Self-pay

## 2019-06-29 NOTE — Telephone Encounter (Signed)
Patient advised of recommendations.  

## 2019-06-29 NOTE — Telephone Encounter (Signed)
RN [place] from [facility] called stating that pt's weekly INR check for today is:  PT 24.8 INR 2.1  Pt currently taking 6 mg Mon through Sat and 9 mg Sun. Should pt continue with current coumadin therapy with weekly check or does provider need to make any adjustments?  Pls advise, thanks.

## 2019-06-29 NOTE — Telephone Encounter (Signed)
Continue current dose Continue weekly INR checks due to history of very labile INR levels

## 2019-07-02 ENCOUNTER — Other Ambulatory Visit: Payer: Self-pay | Admitting: Osteopathic Medicine

## 2019-07-02 DIAGNOSIS — R32 Unspecified urinary incontinence: Secondary | ICD-10-CM | POA: Diagnosis not present

## 2019-07-02 DIAGNOSIS — I10 Essential (primary) hypertension: Secondary | ICD-10-CM | POA: Diagnosis not present

## 2019-07-04 NOTE — Progress Notes (Signed)
Subjective:   Gloria Mckay is a 78 y.o. female who presents for Medicare Annual (Subsequent) preventive examination.  Review of Systems:  No ROS.  Medicare Wellness Virtual Visit.  Visual/audio telehealth visit, UTA vital signs.   See social history for additional risk factors.    Cardiac Risk Factors include: advanced age (>14mn, >>61women);sedentary lifestyle;hypertension Sleep patterns:getting at least 6 hours of sleep a night. Wakes up 1 time a night to void. Wakes up and feels sluggish.  Home Safety/Smoke Alarms: Feels safe in home. Smoke alarms in place.  Living environment; Lives alone in retirement community. No steps. Shower is a step over shower tub combo and grab bars are in place around the tub.  Seat Belt Safety/Bike Helmet: Wears seat belt.   Female:   Pap- Aged out      Mammo- Aged out      Dexa scan-  UTD      CCS- Aged out       Objective:     Vitals: BP (!) 152/67   Pulse 89   Temp 98.1 F (36.7 C) (Oral)   Ht '5\' 3"'  (1.6 m)   Wt 113 lb (51.3 kg)   SpO2 99%   BMI 20.02 kg/m   Body mass index is 20.02 kg/m.  Advanced Directives 07/10/2019 07/04/2018  Does Patient Have a Medical Advance Directive? Yes Yes  Type of [place]  Does patient want to make changes to medical advance directive? No - Patient declined No - Patient declined  Copy of [place] Chart? No - copy requested No - copy requested    Tobacco Social History   Tobacco Use  Smoking Status Never Smoker  Smokeless Tobacco Never Used     Counseling given: No   Clinical Intake:  Pre-visit preparation completed: Yes  Pain : 0-10 Pain Score: 5  Pain Location: Back Pain Orientation: Upper Pain Descriptors / Indicators: Aching, Throbbing Pain Onset: More than a month ago Pain Frequency: Constant Pain Relieving Factors: heat helps  Pain Relieving Factors: heat  helps  Nutritional Risks: Other (Comment) Diabetes: No  How often do you need to have someone help you when you read instructions, pamphlets, or other written materials from your doctor or pharmacy?: 1 - Never What is the last grade level you completed in school?: 12  Interpreter Needed?: No  Information entered by :: KOrlie Dakin LPN  Past Medical History:  Diagnosis Date  . Arthritis   . Cancer ([place]   . Cataract    bilaterally  . Hypertension   . Myelodysplasia (myelodysplastic syndrome) ([place]   . [facility] ([facility]    Past Surgical History:  Procedure Laterality Date  . JOINT REPLACEMENT    . STOMACH SURGERY     Family History  Problem Relation Age of Onset  . Hypertension Mother   . Pulmonary embolism Mother   . Heart disease Father    Social History   Socioeconomic History  . Marital status: Widowed    Spouse name: Not on file  . Number of children: 4  . Years of education: 172 . Highest education level: 12th grade  Occupational History  . 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 Financial resource strain: Not hard at all  . Food insecurity    Worry: Never true    Inability: Never true  . Transportation needs    Medical:  No    Non-medical: No  Tobacco Use  . Smoking status: Never Smoker  . Smokeless tobacco: Never Used  Substance and Sexual Activity  . Alcohol use: No  . Drug use: No  . Sexual activity: Not Currently  Lifestyle  . Physical activity    Days per week: 0 days    Minutes per session: 0 min  . Stress: Not at all  Relationships  . Social connections    Talks on phone: More than three times a week    Gets together: Once a week    Attends religious service: More than 4 times per year    Active member of club or organization: No    Attends meetings of clubs or organizations: Never    Relationship status: Widowed  Other Topics Concern  . Not on file  Social History Narrative   Patient is  going through a lot of health issues right now ? bone marrow biopsy    Outpatient Encounter Medications as of 07/10/2019  Medication Sig  . AMBULATORY NON FORMULARY MEDICATION Extend home health PT to include left shoulder and right leg.  Left rotator cuff injury and contusion right leg  . Calcium Carbonate-Vitamin D 600-400 MG-UNIT chew tablet Chew 2 tablets by mouth daily.  . carvedilol (COREG) 25 MG tablet Take 1 tablet (25 mg total) by mouth 2 (two) times daily with a meal.  . Cholecalciferol (VITAMIN D3) 50000 units CAPS Take 1 tablet by mouth daily.  . clindamycin (CLEOCIN) 300 MG capsule TAKE 1 CAPSULE BY MOUTH TWICE A DAY  . clobetasol ointment (TEMOVATE) 0.05 % Apply to affected area daily and then QOD  . hydrALAZINE (APRESOLINE) 25 MG tablet Take 1 tablet (25 mg total) by mouth 2 (two) times daily.  Marland Kitchen HYDROcodone-acetaminophen (NORCO) 7.5-325 MG tablet TAKE ONE TABLET BY MOUTH EVERY 6 HOURS AS NEEDED FOR MODERATE OR SEVERE PAIN  . losartan (COZAAR) 100 MG tablet Take 1 tablet (100 mg total) by mouth daily.  . mometasone (ELOCON) 0.1 % cream Apply 1 application topically daily.  . Omega-3 Fatty Acids ([employer] FISH OIL PO) Take by mouth.  . topiramate (TOPAMAX) 100 MG tablet Take 100 mg by mouth 2 (two) times daily.  . Vitamin D, Ergocalciferol, (DRISDOL) 1.25 MG (50000 UT) CAPS capsule Take 1 capsule (50,000 Units total) by mouth every 7 (seven) days.  Marland Kitchen warfarin (COUMADIN) 6 MG tablet Take 1 tablet (6 mg total) by mouth daily.  Marland Kitchen amLODipine (NORVASC) 10 MG tablet Take 1 tablet (10 mg total) by mouth daily. (Patient not taking: Reported on 07/10/2019)  . denosumab (PROLIA) 60 MG/ML SOSY injection Inject 60 mg into the skin every 6 (six) months. Administer in upper arm, thigh, or abdomen (Patient not taking: Reported on 07/10/2019)  . enoxaparin (LOVENOX) 30 MG/0.3ML injection Inject 0.3 mLs (30 mg total) into the skin daily for 7 days.  . mupirocin ointment (BACTROBAN) 2 % Place 1  application into the nose 2 (two) times daily.  . Prasterone (INTRAROSA) 6.5 MG INST Place 1 suppository vaginally at bedtime. (Patient not taking: Reported on 07/10/2019)  . warfarin (COUMADIN) 4 MG tablet Take 1 tablet (4 mg total) by mouth daily. (Patient not taking: Reported on 07/10/2019)  . [DISCONTINUED] warfarin (COUMADIN) 6 MG tablet 6 mg (one tablet) po daily   No facility-administered encounter medications on file as of 07/10/2019.     Activities of Daily Living In your present state of health, do you have any difficulty performing the following  activities: 07/10/2019  Hearing? Y  Comment wears hearing aids and still notices hearing loss  Vision? Y  Comment blurred vision, sometimes double vision  Difficulty concentrating or making decisions? Y  Comment sees neurologist  Walking or climbing stairs? Y  Comment due to leg  Dressing or bathing? N  Doing errands, shopping? N  Preparing Food and eating ? N  Using the Toilet? N  In the past six months, have you accidently leaked urine? Y  Comment wears pads during the day  Do you have problems with loss of bowel control? N  Managing your Medications? N  Managing your Finances? N  Housekeeping or managing your Housekeeping? N  Some recent data might be hidden    Patient Care Team: Emeterio Reeve, DO as PCP - General (Osteopathic Medicine) Constance Haw, MD as PCP - Electrophysiology (Cardiology)    Assessment:   This is a routine wellness examination for Talya.Physical assessment deferred to PCP.   Exercise Activities and Dietary recommendations Current Exercise Habits: The patient does not participate in regular exercise at present, Exercise limited by: orthopedic condition(s) Diet Eats a healthy diet loves vegetables. Breakfast: strawberries or grapes with applesauce and toast with coffee Lunch:  grilled cheese, rice Dinner: Meat and vegetables      Goals    . Patient Stated     Wants to get help with  getting her upper body strength back.       Fall Risk Fall Risk  07/10/2019 07/04/2018 03/30/2018 12/30/2016  Falls in the past year? 1 0 Yes No  Comment - - Emmi Telephone Survey: data to providers prior to load -  Number falls in past yr: 1 - 1 -  Comment - - Emmi Telephone Survey Actual Response = 1 -  Injury with Fall? 0 - Yes -  Risk for fall due to : Impaired balance/gait;Impaired mobility;History of fall(s) Impaired mobility;Impaired balance/gait - -  Follow up - Falls prevention discussed - -   Is the patient's home free of loose throw rugs in walkways, pet beds, electrical cords, etc?   yes      Grab bars in the bathroom? yes      Handrails on the stairs?   no      Adequate lighting?   yes   Depression Screen PHQ 2/9 Scores 07/10/2019 10/30/2018 07/04/2018 02/23/2018  PHQ - 2 Score 0 2 0 0  PHQ- 9 Score - 9 - 4     Cognitive Function     6CIT Screen 07/10/2019 07/04/2018  What Year? 0 points 0 points  What month? 0 points 0 points  What time? 0 points 0 points  Count back from 20 0 points 0 points  Months in reverse 0 points 2 points  Repeat phrase 0 points 0 points  Total Score 0 2    Immunization History  Administered Date(s) Administered  . Fluad Quad(high Dose 65+) 06/15/2019  . Influenza Split 05/12/2009, 05/22/2012, 06/02/2016  . Influenza, High Dose Seasonal PF 05/22/2012, 05/25/2013, 05/21/2014, 06/05/2015, 04/21/2017, 05/19/2018  . Influenza-Unspecified 05/18/2011, 06/02/2016, 04/21/2017  . Pneumococcal Conjugate-13 08/13/2016  . Pneumococcal Polysaccharide-23 02/03/2003, 07/31/2013  . Pneumococcal-Unspecified 02/03/2003, 07/31/2013  . Tdap 11/13/2010    Screening Tests Health Maintenance  Topic Date Due  . TETANUS/TDAP  11/12/2020  . INFLUENZA VACCINE  Completed  . DEXA SCAN  Completed  . PNA vac Low Risk Adult  Completed       Plan:    Please schedule your next medicare wellness  visit with me in 1 yr.  Ms. Cozad , Thank you for taking time to  come for your Medicare Wellness Visit. I appreciate your ongoing commitment to your health goals. Please review the following plan we discussed and let me know if I can assist you in the future.   These are the goals we discussed: Goals    . Patient Stated     Wants to get help with getting her upper body strength back.       This is a list of the screening recommended for you and due dates:  Health Maintenance  Topic Date Due  . Tetanus Vaccine  11/12/2020  . Flu Shot  Completed  . DEXA scan (bone density measurement)  Completed  . Pneumonia vaccines  Completed      I have personally reviewed and noted the following in the patient's chart:   . Medical and social history . Use of alcohol, tobacco or illicit drugs  . Current medications and supplements . Functional ability and status . Nutritional status . Physical activity . Advanced directives . List of other physicians . Hospitalizations, surgeries, and ER visits in previous 12 months . Vitals . Screenings to include cognitive, depression, and falls . Referrals and appointments  In addition, I have reviewed and discussed with patient certain preventive protocols, quality metrics, and best practice recommendations. A written personalized care plan for preventive services as well as general preventive health recommendations were provided to patient.     Joanne Chars, LPN  02/20/7627

## 2019-07-05 ENCOUNTER — Telehealth: Payer: Self-pay

## 2019-07-05 NOTE — Telephone Encounter (Signed)
Current dosage is good.  Make sure that it is updated on her med list in the chart.  Continue with once per week INR checks given history of very labile INR

## 2019-07-05 NOTE — Telephone Encounter (Signed)
Left a detailed vm msg for [place] at [facility] regarding management of INR for pt. Aware to continue with the current dosing/wkly INR check. Direct call back info provided.

## 2019-07-05 NOTE — Telephone Encounter (Signed)
RN [place] from [facility] called stating that pt's weekly INR check for today is:  PT 33.7 INR 2.9  Pt currently taking 6 mg Mon through Sat and 9 mg on Sun. Should pt continue with current coumadin therapy with weekly check or does provider need to make any adjustments? [place] stated that it is imperative that we call her with an update because she has not been getting any calls. Pls advise, thanks.

## 2019-07-08 ENCOUNTER — Other Ambulatory Visit: Payer: Self-pay | Admitting: Osteopathic Medicine

## 2019-07-09 NOTE — Telephone Encounter (Signed)
Requested medication (s) are due for refill today: yes  Requested medication (s) are on the active medication list: yes  Last refill:  04/19/2019  Future visit scheduled: yes  Notes to clinic:  Refill cannot be delegated    Requested Prescriptions  Pending Prescriptions Disp Refills   warfarin (COUMADIN) 6 MG tablet [Pharmacy Med Name: warfarin 6 mg tablet] 90 tablet 1    Sig: Take 1 tablet (6 mg total) by mouth daily.     Hematology:  Anticoagulants - warfarin Failed - 07/08/2019  8:00 AM      Failed - This refill cannot be delegated      Failed - If the patient is managed by Coumadin Clinic - route to their Pool. If not, forward to the provider.      Failed - INR in normal range and within 30 days    INR  Date Value Ref Range Status  05/31/2019 2.2 (A) 0.9 - 1.1 Final         Failed - Valid encounter within last 3 months    Recent Outpatient Visits          5 months ago Other chronic postprocedural pain   [place] [facility], [place], DO   7 months ago Traumatic seroma of lower leg, right, initial encounter [facility])   [place] [facility], Rebekah [place], MD   8 months ago Paroxysmal atrial fibrillation [facility])   [place] [facility], Lanelle Bal, DO   9 months ago Drug-induced fever   [place] [facility] [place], DO   11 months ago Accidental fall, subsequent encounter   [facility], Gwen Her, MD      Future Appointments            [place]

## 2019-07-10 ENCOUNTER — Telehealth: Payer: Self-pay | Admitting: Osteopathic Medicine

## 2019-07-10 ENCOUNTER — Other Ambulatory Visit: Payer: Self-pay

## 2019-07-10 ENCOUNTER — Ambulatory Visit (INDEPENDENT_AMBULATORY_CARE_PROVIDER_SITE_OTHER): Payer: Medicare Other | Admitting: *Deleted

## 2019-07-10 VITALS — BP 152/67 | HR 89 | Temp 98.1°F | Ht 63.0 in | Wt 113.0 lb

## 2019-07-10 DIAGNOSIS — M5136 Other intervertebral disc degeneration, lumbar region: Secondary | ICD-10-CM

## 2019-07-10 DIAGNOSIS — Z86718 Personal history of other venous thrombosis and embolism: Secondary | ICD-10-CM

## 2019-07-10 DIAGNOSIS — Z0000 Encounter for general adult medical examination without abnormal findings: Secondary | ICD-10-CM | POA: Diagnosis not present

## 2019-07-10 DIAGNOSIS — I129 Hypertensive chronic kidney disease with stage 1 through stage 4 chronic kidney disease, or unspecified chronic kidney disease: Secondary | ICD-10-CM

## 2019-07-10 DIAGNOSIS — H3530 Unspecified macular degeneration: Secondary | ICD-10-CM

## 2019-07-10 DIAGNOSIS — T8459XD Infection and inflammatory reaction due to other internal joint prosthesis, subsequent encounter: Secondary | ICD-10-CM

## 2019-07-10 DIAGNOSIS — N183 Chronic kidney disease, stage 3 unspecified: Secondary | ICD-10-CM

## 2019-07-10 DIAGNOSIS — Z96659 Presence of unspecified artificial knee joint: Secondary | ICD-10-CM

## 2019-07-10 DIAGNOSIS — I480 Paroxysmal atrial fibrillation: Secondary | ICD-10-CM

## 2019-07-10 NOTE — Telephone Encounter (Signed)
Done

## 2019-07-10 NOTE — Telephone Encounter (Signed)
Dr. Sheppard Coil   I received an email from [facility] and due to the Medicare laws they need to discharge Gloria Mckay and reopen her. They are seeing her for Skilled Nursing and OT and will need a new referral can you please put one in so I can get the new order to them. Thank you Jenny Reichmann

## 2019-07-10 NOTE — Patient Instructions (Signed)
Please schedule your next medicare wellness visit with me in 1 yr.  Gloria Mckay , Thank you for taking time to come for your Medicare Wellness Visit. I appreciate your ongoing commitment to your health goals. Please review the following plan we discussed and let me know if I can assist you in the future.   These are the goals we discussed: Goals    . Patient Stated     Wants to get help with getting her upper body strength back.

## 2019-07-12 ENCOUNTER — Encounter: Payer: Self-pay | Admitting: Osteopathic Medicine

## 2019-07-21 ENCOUNTER — Other Ambulatory Visit: Payer: Self-pay | Admitting: Osteopathic Medicine

## 2019-07-21 DIAGNOSIS — E559 Vitamin D deficiency, unspecified: Secondary | ICD-10-CM

## 2019-07-23 ENCOUNTER — Telehealth: Payer: Self-pay | Admitting: Osteopathic Medicine

## 2019-07-23 ENCOUNTER — Other Ambulatory Visit: Payer: Self-pay | Admitting: Osteopathic Medicine

## 2019-07-23 DIAGNOSIS — G8928 Other chronic postprocedural pain: Secondary | ICD-10-CM

## 2019-07-23 DIAGNOSIS — M5136 Other intervertebral disc degeneration, lumbar region: Secondary | ICD-10-CM

## 2019-07-23 NOTE — Telephone Encounter (Signed)
Doesn't look like her home health company has called toinform [place] of INR, she should have been getting checks every other week. (lab results may have ended up in phone notes and not abstracted, my bad)  Is she still getting home health?  OK to get IRN check if hematology is ok with it and let me know what it is.

## 2019-07-23 NOTE — Telephone Encounter (Signed)
Requested medication (s) are due for refill today: yes  Requested medication (s) are on the active medication list: yes  Last refill: 06/22/2019  Future visit scheduled: yes  Notes to clinic:  Refill cannot be delegated    Requested Prescriptions  Pending Prescriptions Disp Refills   HYDROcodone-acetaminophen ([place]) 7.5-325 MG tablet [Pharmacy Med Name: hydrocodone 7.5 mg-acetaminophen 325 mg tablet] 60 tablet 0    Sig: TAKE ONE TABLET BY MOUTH EVERY 6 HOURS AS NEEDED FOR MODERATE OR SEVERE PAIN     Not Delegated - Analgesics:  Opioid Agonist Combinations Failed - 07/23/2019  9:36 AM      Failed - This refill cannot be delegated      Failed - Urine Drug Screen completed in last 360 days.      Passed - Valid encounter within last 6 months    Recent Outpatient Visits          5 months ago Other chronic postprocedural pain   [place] [facility], [place], DO   7 months ago Traumatic seroma of lower leg, right, initial encounter [facility])   [place] [facility], Rebekah [place], MD   8 months ago Paroxysmal atrial fibrillation [facility])   [place] [facility], Lanelle Bal, DO   10 months ago Drug-induced fever   [place] [facility] [place], DO   11 months ago Accidental fall, subsequent encounter   [facility], Gwen Her, MD      Future Appointments            In 11 months [place] [facility]            Signed Prescriptions Disp Refills   losartan (COZAAR) 100 MG tablet 90 tablet 0    Sig: Take 1 tablet (100 mg total) by mouth daily.     Cardiovascular:  Angiotensin Receptor Blockers Failed - 07/23/2019  9:36 AM      Failed - Cr in normal range and within 180 days    Creatinine  Date Value Ref Range Status  12/27/2018 1.8 (A) 0.5 - 1.1 Final   Creat  Date Value Ref  Range Status  10/30/2018 1.43 (H) 0.60 - 0.93 mg/dL Final    Comment:    For patients >2 years of age, the reference limit for Creatinine is approximately 13% higher for people identified as African-American. .          Failed - K in normal range and within 180 days    Potassium  Date Value Ref Range Status  12/27/2018 4.5  Final         Failed - Last BP in normal range    BP Readings from Last 1 Encounters:  07/10/19 (!) 152/67         Passed - Patient is not pregnant      Passed - Valid encounter within last 6 months    Recent Outpatient Visits          5 months ago Other chronic postprocedural pain   [place] [facility], [place], DO   7 months ago Traumatic seroma of lower leg, right, initial encounter [facility])   [place] Corey, Rebekah [place], MD   8 months ago Paroxysmal atrial fibrillation [facility])   [place], [place], DO  10 months ago Drug-induced fever   [place] [facility] [place], DO   11 months ago Accidental fall, subsequent encounter   [facility] [place], MD      Future Appointments            In 11 months [facility]

## 2019-07-23 NOTE — Telephone Encounter (Signed)
Patient called and is concerned about her not getting any calls about any changes in her Coumadin. She is going to her hematology doctor tomorrow and is going to have them check the levels and have their office fax a copy for you to review. Do you have any other recommendations? Do we have labs from the home health after October 1 that is scanned in her chart? Please advise.

## 2019-07-23 NOTE — Telephone Encounter (Signed)
Noted. She will have it faxed to the office for you to review.

## 2019-07-24 ENCOUNTER — Ambulatory Visit: Payer: Medicare Other

## 2019-07-24 ENCOUNTER — Telehealth: Payer: Self-pay | Admitting: Osteopathic Medicine

## 2019-07-24 NOTE — Telephone Encounter (Signed)
I called the patient and her daughter and they were not able to get in the office today for an INR. She was offered today and was not able to make it to the appointment due to lack transportation and another appointment. Patient has reported that she has a couple of new bruises on her arms and has not had any falls.   Kylei is going to call the supervisor and check the status of someone coming to get this checked.   I have also left a message for [place] as well.

## 2019-07-25 ENCOUNTER — Other Ambulatory Visit: Payer: Self-pay

## 2019-07-25 ENCOUNTER — Ambulatory Visit (INDEPENDENT_AMBULATORY_CARE_PROVIDER_SITE_OTHER): Payer: Medicare Other | Admitting: Licensed Clinical Social Worker

## 2019-07-25 DIAGNOSIS — F4322 Adjustment disorder with anxiety: Secondary | ICD-10-CM
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
## 2019-07-27 DIAGNOSIS — I129 Hypertensive chronic kidney disease with stage 1 through stage 4 chronic kidney disease, or unspecified chronic kidney disease: Secondary | ICD-10-CM | POA: Diagnosis not present

## 2019-07-27 DIAGNOSIS — M150 Primary generalized (osteo)arthritis: Secondary | ICD-10-CM | POA: Diagnosis not present

## 2019-07-27 DIAGNOSIS — M5136 Other intervertebral disc degeneration, lumbar region: Secondary | ICD-10-CM | POA: Diagnosis not present

## 2019-07-27 DIAGNOSIS — Z86718 Personal history of other venous thrombosis and embolism: Secondary | ICD-10-CM | POA: Diagnosis not present

## 2019-07-27 DIAGNOSIS — I480 Paroxysmal atrial fibrillation: Secondary | ICD-10-CM | POA: Diagnosis not present

## 2019-07-27 DIAGNOSIS — Z85831 Personal history of malignant neoplasm of soft tissue: Secondary | ICD-10-CM | POA: Diagnosis not present

## 2019-07-27 DIAGNOSIS — H3530 Unspecified macular degeneration: Secondary | ICD-10-CM | POA: Diagnosis not present

## 2019-07-27 DIAGNOSIS — N1832 Chronic kidney disease, stage 3b: Secondary | ICD-10-CM | POA: Diagnosis not present

## 2019-07-27 DIAGNOSIS — Z79891 Long term (current) use of opiate analgesic: Secondary | ICD-10-CM | POA: Diagnosis not present

## 2019-07-27 DIAGNOSIS — D631 Anemia in chronic kidney disease: Secondary | ICD-10-CM | POA: Diagnosis not present

## 2019-07-27 DIAGNOSIS — Z96652 Presence of left artificial knee joint: Secondary | ICD-10-CM | POA: Diagnosis not present

## 2019-07-27 DIAGNOSIS — Z95820 Peripheral vascular angioplasty status with implants and grafts: Secondary | ICD-10-CM | POA: Diagnosis not present

## 2019-07-27 DIAGNOSIS — D469 Myelodysplastic syndrome, unspecified: Secondary | ICD-10-CM | POA: Diagnosis not present

## 2019-07-27 DIAGNOSIS — D500 Iron deficiency anemia secondary to blood loss (chronic): Secondary | ICD-10-CM | POA: Diagnosis not present

## 2019-07-27 DIAGNOSIS — Z5181 Encounter for therapeutic drug level monitoring: Secondary | ICD-10-CM | POA: Diagnosis not present

## 2019-07-27 DIAGNOSIS — G729 Myopathy, unspecified: Secondary | ICD-10-CM | POA: Diagnosis not present

## 2019-07-27 DIAGNOSIS — Z7901 Long term (current) use of anticoagulants: Secondary | ICD-10-CM | POA: Diagnosis not present

## 2019-08-02 ENCOUNTER — Telehealth: Payer: Self-pay

## 2019-08-02 NOTE — Telephone Encounter (Signed)
Pt has been updated of provider's recommendation. Aware to skip today's dose of Coumadin therapy. Attempted to contact [place] from [facility]. Phone keeps ringing then disconnects. Unable to inform RN of provider's instructions.

## 2019-08-02 NOTE — Telephone Encounter (Signed)
[place] from [facility] left a vm msg with pt's INR results:   PT: 40.8 INR: 3.8  Pt currently taking warfarin 6 mg Monday through Saturday and warfarin 9 mg on Sunday. RN wants to know whether weekly PT/INR checks should continue and whether provider need to make any adjustments to pt's current Coumadin therapy? She can be contacted at [phone]. Pls advise, thanks.

## 2019-08-02 NOTE — Telephone Encounter (Signed)
Given history of labile INR, I would like the patient to continue weekly INR checks.  I would like to change Coumadin dosing, can skip today's dose, or if she has already taken it skip tomorrow.  Switch to 6 mg daily, recheck INR next Thursday if possible, or Weds would also be ok

## 2019-08-02 NOTE — Telephone Encounter (Signed)
Attempted a second call to [place] from [facility] regarding provider's note. Phone keeps ringing with no option to leave a vm msg.

## 2019-08-06 NOTE — Telephone Encounter (Signed)
Attempted a third call to [place] from [facility] regarding provider's note. Phone keeps ringing then has busy signal. No option to leave a vm msg

## 2019-08-08 ENCOUNTER — Telehealth: Payer: Self-pay

## 2019-08-08 NOTE — Telephone Encounter (Signed)
RN Wonda Cheng from [facility] left a vm msg with pt's INR results:   PT: 16.6 INR: 1.4  Pt currently taking warfarin 6 mg Monday through Saturday and warfarin 9 mg on Sunday. RN wants to know whether weekly PT/INR checks should continue and whether provider need to make any adjustments to pt's current Coumadin therapy? She can be contacted at [phone]. Pls advise, thanks.

## 2019-08-08 NOTE — Telephone Encounter (Signed)
INR below goal range.   Increase Coumadin to 9 mg on Thurs, Sun. Continue 6 mg other days. Recheck INR in one week.

## 2019-08-09 NOTE — Telephone Encounter (Signed)
I have called and spoke with the patient and gave her the results. Gloria Mckay was appreciative of the call.  I have also left a message for the nurse on call with the changes in the patient Gloria Mckay changes. Waiting on call.

## 2019-08-16 ENCOUNTER — Telehealth: Payer: Self-pay | Admitting: Osteopathic Medicine

## 2019-08-16 NOTE — Telephone Encounter (Signed)
Let us try 6 mg daily with 9 mg 1 day/week, if patient has been taking them on Saturday and Sunday, I would just have her take it on 1 of those days.

## 2019-08-16 NOTE — Telephone Encounter (Signed)
Patient and home health advised.

## 2019-08-16 NOTE — Telephone Encounter (Signed)
Patient called and her infectious disease provider would like to know if you could order lower back and right hip x-rays. He is not sure how to order them and she is haivng some back and hip pain. She does not want to see Dr. Darene Lamer at this time and is still having Physical Therapy come to her house. Please advise.

## 2019-08-16 NOTE — Telephone Encounter (Signed)
Lovena Le from [facility] called regarding pt's INR reading:  PT: 35.3 INR: 3.2  As per Lovena Le, pt currently taking 6 mg Monday - Saturday and 9 mg on Sunday. Lovena Le was informed that Coumadin therapy was changed on 08/08/19 by provider. She is aware pt should be taking 6 mg on Monday/Tues/Wed/Fri/Sat and 9 mg on Thurs/Sunday with weekly INR checks. Lovena Le will remind patient new Coumadin Therapy. Lovena Le may be contacted at [phone] for additional inquiries.

## 2019-08-16 NOTE — Telephone Encounter (Signed)
Pt has been updated of provider's recommendation. Agreeable with plan. Pt transferred to Damir B for scheduling appt with Dr T or Dr Sheppard Coil. No other inquiries during call.

## 2019-08-16 NOTE — Telephone Encounter (Signed)
She needs an evaluation, just ordering x-rays is not appropriate work-up for pain complaints.  If she would like to have a virtual with me or Dr. Darene Lamer, that would be necessary prior to ordering any tests

## 2019-08-17 ENCOUNTER — Ambulatory Visit (INDEPENDENT_AMBULATORY_CARE_PROVIDER_SITE_OTHER): Payer: Medicare Other | Admitting: Sports Medicine

## 2019-08-17 ENCOUNTER — Encounter: Payer: Self-pay | Admitting: Sports Medicine

## 2019-08-17 ENCOUNTER — Other Ambulatory Visit: Payer: Self-pay

## 2019-08-17 DIAGNOSIS — M48061 Spinal stenosis, lumbar region without neurogenic claudication: Secondary | ICD-10-CM | POA: Diagnosis not present

## 2019-08-17 MED ORDER — HYDROCODONE-ACETAMINOPHEN 10-325 MG PO TABS: 1.0000 | ORAL_TABLET | Freq: Two times a day (BID) | ORAL | 0 refills | Status: DC

## 2019-08-17 NOTE — Assessment & Plan Note (Signed)
Persistence of pain, back rating to the lateral hip, better with flexion consistent with lumbar spinal stenosis. Declines any interventional treatment, unable to use NSAIDs as she is on Coumadin. We will do home health physical therapy and double her hydrocodone. Return in a month.

## 2019-08-17 NOTE — Progress Notes (Signed)
Subjective:    CC: Back pain  HPI: Gloria Mckay is a frail 78 year old female with multiple medical problems, she is fairly debilitated, she has known lumbar spondylosis.  For several months now she has had severe back pain, right-sided, worse with standing, better with leaning forward, no new onset bowel or bladder dysfunction, saddle numbness, constitutional symptoms she declines NSAIDs, neuropathic agents, and declines any interventional treatment.  She is taking hydrocodone 7.5 which is only minimally efficacious.  I reviewed the past medical history, family history, social history, surgical history, and allergies today and no changes were needed.  Please see the problem list section below in epic for further details.  Past Medical History: Past Medical History:  Diagnosis Date  . Arthritis   . Cancer ([place])   . Cataract    bilaterally  . Hypertension   . Myelodysplasia (myelodysplastic syndrome) ([place])   . Myelodysplastic syndrome [facility])    Past Surgical History: Past Surgical History:  Procedure Laterality Date  . JOINT REPLACEMENT    . STOMACH SURGERY     Social History: Social History   Socioeconomic History  . Marital status: Widowed    Spouse name: Not on file  . Number of children: 4  . Years of education: 76  . Highest education level: 12th grade  Occupational History  . 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 Smoking status: Never Smoker  . Smokeless tobacco: Never Used  Substance and Sexual Activity  . Alcohol use: No  . Drug use: No  . Sexual activity: Not Currently  Other Topics Concern  . Not on file  Social History Narrative   Patient is going through a lot of health issues right now ? bone marrow biopsy   Social Determinants of Health   Financial Resource Strain:   . Difficulty of Paying Living Expenses: Not on file  Food Insecurity:   . Worried About Charity fundraiser in the Last Year: Not on file  . Ran Out of Food in  the Last Year: Not on file  Transportation Needs:   . Lack of Transportation (Medical): Not on file  . Lack of Transportation (Non-Medical): Not on file  Physical Activity:   . Days of Exercise per Week: Not on file  . Minutes of Exercise per Session: Not on file  Stress:   . Feeling of Stress : Not on file  Social Connections: Unknown  . Frequency of Communication with Friends and Family: More than three times a week  . Frequency of Social Gatherings with Friends and Family: Not on file  . Attends Religious Services: More than 4 times per year  . Active Member of Clubs or Organizations: Not on file  . Attends Archivist Meetings: Not on file  . Marital Status: Not on file   Family History: Family History  Problem Relation Age of Onset  . Hypertension Mother   . Pulmonary embolism Mother   . Heart disease Father    Allergies: Allergies  Allergen Reactions  . Cefuroxime Anaphylaxis    Difficulty swallowing pills because they were too dry.  Caused tablet dysphagia.  . Diazepam Shortness Of Breath    HYPERVENTILATION     . Diphenhydramine Palpitations    Affected heart rate/er told her not to take again  . Latex Rash and Shortness Of Breath    Airway swelling  . Other Palpitations    Affected heart rate/er told her not to take again  .  Amlodipine Cough and Other (See Comments)    Not sure  . Butorphanol Hives  . Diclofenac Hives  . Diclofenac Sodium Hives  . Diphenhydramine Hcl Palpitations    Affected heart rate/er told her not to take again  . Methylpyrrolidone Hives  . Ondansetron Hives and Other (See Comments)    Sedates/knocks her out    . Oxycodone Hives  . Penicillins Hives and Swelling    Most mycin drugs  . Quinolones Other (See Comments)    Joint pain  . Sulfa Antibiotics Hives and Swelling  . Butorphanol Tartrate Hives  . Lisinopril Cough    Caused pt to cough  . Ramipril Cough    Pt c/o cough  . Ace Inhibitors Other (See Comments) and  Cough    Lisinopril and ramipril   . Ciprofloxacin Rash    Patient prefers not to take Fluoroquinolones   . Codeine Nausea Only  . Pantoprazole Sodium Diarrhea   Medications: See med rec.  Review of Systems: No fevers, chills, night sweats, weight loss, chest pain, or shortness of breath.   Objective:    General: Well Developed, well nourished, and in no acute distress.  Neuro: Alert and oriented x3, extra-ocular muscles intact, sensation grossly intact.  HEENT: Normocephalic, atraumatic, pupils equal round reactive to light, neck supple, no masses, no lymphadenopathy, thyroid nonpalpable.  Skin: Warm and dry, no rashes. Cardiac: Regular rate and rhythm, no murmurs rubs or gallops, no lower extremity edema.  Respiratory: Clear to auscultation bilaterally. Not using accessory muscles, speaking in full sentences.  Impression and Recommendations:    Lumbar spinal stenosis Persistence of pain, back rating to the lateral hip, better with flexion consistent with lumbar spinal stenosis. Declines any interventional treatment, unable to use NSAIDs as she is on Coumadin. We will do home health physical therapy and double her hydrocodone. Return in a month.  I spent 25 minutes with this patient, greater than 50% was face-to-face time counseling regarding the above diagnoses.  ___________________________________________ Gwen Her. Dianah Field, M.D., ABFM., CAQSM. Primary Care and Sports Medicine [facility]  Adjunct Professor of [employer]

## 2019-08-27 ENCOUNTER — Ambulatory Visit (INDEPENDENT_AMBULATORY_CARE_PROVIDER_SITE_OTHER): Payer: Medicare Other | Admitting: Licensed Clinical Social Worker

## 2019-08-27 ENCOUNTER — Telehealth: Payer: Self-pay

## 2019-08-27 DIAGNOSIS — F4322 Adjustment disorder with anxiety: Secondary | ICD-10-CM | POA: Diagnosis not present

## 2019-08-27 NOTE — Telephone Encounter (Signed)
Gloria Mckay from [facility] left a vm msg with pt's weekly results for Coumadin therapy. As per Gloria Mckay:   PT: 32.0 INR: 2.9  Pt currently taking 6 mg on Monday - Saturday and 9 mg on Sunday. Should pt continue current Coumadin therapy and weekly checks or does provider has new recommendations? Pls advise, thanks.

## 2019-08-27 NOTE — Telephone Encounter (Signed)
Continue current dose Recheck INR weekly  Thanks!

## 2019-08-27 NOTE — Telephone Encounter (Signed)
I called [place] with [facility] and she is going to write the order and make a note on her end for the patient chart.  Patient is aware and did not have any questions.

## 2019-08-27 NOTE — Progress Notes (Addendum)
Virtual Visit via Telephone Note  I connected with [name] on 08/27/19 at  3:00 PM EST by telephone and verified that I am speaking with the correct person using two identifiers.   I discussed the limitations, risks, security and privacy concerns of performing an evaluation and management service by telephone and the availability of in person appointments. I also discussed with the patient that there may be a patient responsible charge related to this service. The patient expressed understanding and agreed to proceed.   I discussed the assessment and treatment plan with the patient. The patient was provided an opportunity to ask questions and all were answered. The patient agreed with the plan and demonstrated an understanding of the instructions.   The patient was advised to call back or seek an in-person evaluation if the symptoms worsen or if the condition fails to improve as anticipated.  I provided 55 minutes of non-face-to-face time during this encounter.  THERAPIST PROGRESS NOTE  Session Time: 3:02 PM to 3:57 PM  Participation Level: Active  Behavioral Response: AlertAnxious, describes issues with patient that impact mood, euthymic in session  Type of Therapy: Individual Therapy  Treatment Goals addressed:   Anxiety, stress management, depession  Interventions: Solution Focused, Strength-based, Supportive, Reframing and Other: coping  Summary: Gloria Mckay is a 78 y.o. female who presents with Christmas was great, a little surprises that made it different. Contacts with family members that haven't talked in awhile and that made it extra special. Went to daughters and broke it up in segments so not too big of a group. Older son came to see her where she is staying. Younger son hasn't been there yet because wife got sick. Other daughter in [place] and she was going to drive down with her family. Was last minute and touch and go with virus. Oldest boy is ready to graduate from high  school. Think he is going to [place]. Concern was going from state to state. With my situation they aren't allowing people to see me. At last minute decided not to come and think it was the best decision. Brother and sister in [place] both in nursing home and have Alzheimer's. Both remember her but when talked it gets mixed up. "It was a precious conversation. Funny but heart breaking." Call from doctor from cancer and bone marrow and spiking up again. Not critical, got to be watched carefully. Have to stay where her doctors are. Other children told her can't visit her daugher in [place]. If something happens has to be close to doctors. Knows the virus has got to her daughter and home sick. Still people moving out of where patient is staying because of restrictions. Still can't go to dining room.  Therapist pointed out with vaccine things could change for the better at the place she is staying. Patient shares that with vaccine will have to opt out but has a lot of allergic reactions. Asked infectious doctor and has told her not to take vaccine. From antibodies to food, asthma, latex.  Talked about some challenging and rewarding experiences from the past. Discussed working in pediatric oncology.  Describes learning from them that they were very strong watching them with spinal and bone marrow procedures. Patient just had one and won't do another. Don't know how they did it. They were tough  Patient provided her own as it of self statement that "I survived it." Shared that the world we live in take it day by day. Reviewed mood, feels good from  Christmas more physical problems and pain wise, Doctor increased pain medications and not happy with that. Makes her sleepy and doesn't like to sleep a lot. Get in corner where can't get done what she needs to get done. Back and lower back tore up with falls. Not sleeping good because of the pain. Ordered her therapy at home. Describes what impacts her is her nerve nerve pain  in spine sometimes doesn't have a lot of feeling in legs and walking not so good. Part of life don't like. Always been active. Made a pot of chicken soup.  Describes her way of dealing with pain things take longer, and does things in  shifts but rather being up and doing something especially for somebody else. Hope she gets a call about the next apartment and hope the first of the year something will come through. Under a new insurance plan will not have big monthly premiums so can get into a nicer place. Trying to get back to [place] with therapy because hands hurt so bad. Reviewed therapeutic plan and patient will check benefits and he is still covered and thinks helpful to continue therapy until she moves into the new place    Suicidal/Homicidal: No  Therapist Response: Reviewed symptoms, discussed stressors facilitated expression of thoughts and feelings as a coping strategy for managing feelings.  Reviewed positive events that have contributed to mood around holidays and talking to his siblings.  On the other hand discuss significant stressors of managing pain and medical issues.  Focused on coping strategies that are effective for patient including staying active, thinking about doing things for others.  Utilize reframing to discuss vaccine may change staying in current living situation.  Discussed patient plan that she wants to move into new facility, encourage patient with hopeful perspective on this happening in future, sharing patience as helpful quality in helping [place] stay on track with her goals.  Discussed other coping strategies for main stressor of her current living situation and provided positive feedback for patient taking things day by day is helpful coping strategy.  Therapist utilized strength based and supportive interventions.  Plan: 1.patient to contact office when she is clear about her insurance coverage and plan is to continue until she moves into next place. 2.  Therapist continue to  work with patient on anxiety, depression, stressors   Diagnosis: Axis I:   adjustment disorder with anxious mood    Axis II: No diagnosis    Cordella Register, LCSW 08/27/2019

## 2019-09-14 ENCOUNTER — Ambulatory Visit: Payer: Medicare Other | Admitting: Sports Medicine

## 2019-09-25 ENCOUNTER — Other Ambulatory Visit: Payer: Self-pay | Admitting: Osteopathic Medicine

## 2019-09-28 ENCOUNTER — Ambulatory Visit (INDEPENDENT_AMBULATORY_CARE_PROVIDER_SITE_OTHER): Payer: Medicare HMO | Admitting: Osteopathic Medicine

## 2019-09-28 ENCOUNTER — Other Ambulatory Visit: Payer: Self-pay

## 2019-09-28 VITALS — BP 151/66 | HR 62

## 2019-09-28 DIAGNOSIS — Z7901 Long term (current) use of anticoagulants: Secondary | ICD-10-CM

## 2019-09-28 DIAGNOSIS — I480 Paroxysmal atrial fibrillation: Secondary | ICD-10-CM | POA: Diagnosis not present

## 2019-09-28 DIAGNOSIS — M810 Age-related osteoporosis without current pathological fracture: Secondary | ICD-10-CM

## 2019-09-28 DIAGNOSIS — D6859 Other primary thrombophilia: Secondary | ICD-10-CM

## 2019-09-28 LAB — POCT INR: INR: 3 (ref 2.0–3.0)

## 2019-09-28 NOTE — Progress Notes (Signed)
Pt notified of staying on same Coumadin dose and to recheck in 2-3 weeks.  Advised pt that we will also get a CMP that day for Prolia.

## 2019-09-28 NOTE — Addendum Note (Signed)
Addended by: Maryla Morrow on: 09/28/2019 04:02 PM   Modules accepted: Orders

## 2019-09-28 NOTE — Progress Notes (Signed)
Looks like it has been a while since we have even done labs for her to discuss Prolia shot.  Next time she is in for INR, let us get complete metabolic panel and will determine from there - orders are in!

## 2019-09-28 NOTE — Progress Notes (Signed)
Pt asked about getting set up with Prolia injections.  She said the last time her labs were checked her calcium wasn't quite where it needed to be.  I advised her that we would likely need updated labs and that I would talk to you about it.  Please advise.

## 2019-10-03 DIAGNOSIS — R32 Unspecified urinary incontinence: Secondary | ICD-10-CM | POA: Diagnosis not present

## 2019-10-10 DIAGNOSIS — E559 Vitamin D deficiency, unspecified: Secondary | ICD-10-CM | POA: Diagnosis not present

## 2019-10-10 DIAGNOSIS — D472 Monoclonal gammopathy: Secondary | ICD-10-CM | POA: Diagnosis not present

## 2019-10-10 DIAGNOSIS — N184 Chronic kidney disease, stage 4 (severe): Secondary | ICD-10-CM | POA: Diagnosis not present

## 2019-10-10 DIAGNOSIS — D638 Anemia in other chronic diseases classified elsewhere: Secondary | ICD-10-CM | POA: Diagnosis not present

## 2019-10-10 DIAGNOSIS — I10 Essential (primary) hypertension: Secondary | ICD-10-CM | POA: Diagnosis not present

## 2019-10-11 ENCOUNTER — Other Ambulatory Visit: Payer: Self-pay | Admitting: Sports Medicine

## 2019-10-11 DIAGNOSIS — M48061 Spinal stenosis, lumbar region without neurogenic claudication: Secondary | ICD-10-CM

## 2019-10-12 ENCOUNTER — Other Ambulatory Visit: Payer: Self-pay

## 2019-10-12 ENCOUNTER — Ambulatory Visit (INDEPENDENT_AMBULATORY_CARE_PROVIDER_SITE_OTHER): Payer: Medicare HMO | Admitting: Family Medicine

## 2019-10-12 VITALS — BP 176/85 | HR 52

## 2019-10-12 DIAGNOSIS — D6859 Other primary thrombophilia: Secondary | ICD-10-CM

## 2019-10-12 DIAGNOSIS — Z7901 Long term (current) use of anticoagulants: Secondary | ICD-10-CM | POA: Diagnosis not present

## 2019-10-12 DIAGNOSIS — I480 Paroxysmal atrial fibrillation: Secondary | ICD-10-CM | POA: Diagnosis not present

## 2019-10-12 LAB — POCT INR: INR: 2.3 (ref 2.0–3.0)

## 2019-10-12 NOTE — Progress Notes (Signed)
Pt presented for a NV INR check. Pt's bp reading was elevated - 176/85. Pt mentioned that she was not taking all her bp rxs as directed by cardiologist. As per pt, only taking carvedilol and losartan. Pt should be taking hydralazine as well. Blood pressure reading was repeated -  158/80. Covering provider informed of blood pressure readings. Per provider, pt is to take carvedilol, losartan and hydrazaline as recommended by cardiologist. Pt instructed to make a NV for blood pressure check in 1 week and INR in 2 weeks.

## 2019-10-16 ENCOUNTER — Other Ambulatory Visit: Payer: Self-pay

## 2019-10-16 ENCOUNTER — Ambulatory Visit (INDEPENDENT_AMBULATORY_CARE_PROVIDER_SITE_OTHER): Payer: Medicare HMO | Admitting: Licensed Clinical Social Worker

## 2019-10-16 DIAGNOSIS — F4322 Adjustment disorder with anxiety: Secondary | ICD-10-CM

## 2019-10-16 NOTE — Progress Notes (Signed)
Virtual Visit via Telephone Note  I connected with Gloria Mckay on 10/16/19 at  2:00 PM EST by telephone and verified that I am speaking with the correct person using two identifiers.   I discussed the limitations, risks, security and privacy concerns of performing an evaluation and management service by telephone and the availability of in person appointments. I also discussed with the patient that there may be a patient responsible charge related to this service. The patient expressed understanding and agreed to proceed.     I discussed the assessment and treatment plan with the patient. The patient was provided an opportunity to ask questions and all were answered. The patient agreed with the plan and demonstrated an understanding of the instructions.   The patient was advised to call back or seek an in-person evaluation if the symptoms worsen or if the condition fails to improve as anticipated.  I provided 55 minutes of non-face-to-face time during this encounter.  THERAPIST PROGRESS NOTE  Session Time: 2:00 PM to 2:55 PM  Participation Level: Active  Behavioral Response: CasualAlertAnxious and Euthymic  Type of Therapy: Individual Therapy  Treatment Goals addressed:   Anxiety, stress management, depession  Interventions: Motivational Interviewing, Strength-based, Supportive and Other: coping  Summary: Gloria Mckay is a 79 y.o. female who presents with sharing good news called her and moving into a new place.  This is significant as it has been one of her main stressors and reasons for coming for mental health treatment related to stressors of current living situation.  And is for her to move in mid March. Discussed family issue. Marland Kitchen Her youngest son has always had an issue, sure he is bipolar, difficult to raise. Girls have been great. Patient were foster parents for a long time. Boys (biological) blame their problems that they were foster parents. Patient doesn't see it that way. Shared  history of youngest at 58 knife over risk going to commit suicide. He is a grown man, married and beautiful wife. Still issues that his mood can flip quickly thinks everyone else the problem.  Brought up another issue boys asking why moving, moved per him a lot when younger. Didn't tell kids about things there. They will have to move her. Asked them to help financially help if needed it. Boys did not commit to help out financially but help her in other ways that mean a lot.'s escalation of family conflict when son-in-law pointed out the boys are not helping her financially and patient stayed out of it. Patient feels youngest son needs counseling and he does not want it. He can be nice and then blow his top. Anger issues. Takes anger out on other people. Don't know how to help hisr wife. Her husband had anger issues. Patient addressing son's issue with anger by encouraging positive relationships with family, help each other out.  Suggested he listen to Nelda Severe who she thinks is good speaker on anger hoping this will influence him.  Thinks when she moves in she will encourage him to sit in one session to further help him address anger issues.  He is open to talking about it just not with wife.  Patient ended session by relating she is looking forward to playing piano again.    Suicidal/Homicidal: No  Therapist Response: Therapist reviewed symptoms, discussed stressors facilitated expression of thoughts and feelings reviewing with patient positive impact of news that she will be moving as this was one of her significant stressors.  Discussed stressors with youngest  son, gathered background information of family history to better understand issue.  Discussed helping son with anger management issues and mood swings.  Reviewed directly communicating with him to help him get help, motivational strategies so he has more insight about issue such as bringing professional information for him to review the see how it  applies to himself, helping him see impact on his relationships, understanding needing to learn emotional regulation strategies for both himself and his relationships.  Provided supportive feedback and strength-based in patient's efforts to help her son.  Also strength based patient pushing goal of being able to move.  Encourage patient with insight that things will work out with patients and hopefulness to encourage her with positive outlook.  Plan: Return again in 5 weeks.2. Therapist continue to work with patient on anxiety, depression, stressors   Diagnosis: Axis I:  adjustment disorder with anxious mood    Axis II: No diagnosis    Cordella Register, LCSW 10/16/2019

## 2019-10-19 ENCOUNTER — Ambulatory Visit: Payer: Medicare HMO

## 2019-10-22 ENCOUNTER — Other Ambulatory Visit: Payer: Self-pay | Admitting: Osteopathic Medicine

## 2019-10-22 NOTE — Telephone Encounter (Signed)
Please review for refill- patient at [facility] 

## 2019-10-24 DIAGNOSIS — H43393 Other vitreous opacities, bilateral: Secondary | ICD-10-CM | POA: Diagnosis not present

## 2019-10-24 DIAGNOSIS — H353132 Nonexudative age-related macular degeneration, bilateral, intermediate dry stage: Secondary | ICD-10-CM | POA: Diagnosis not present

## 2019-10-24 DIAGNOSIS — H524 Presbyopia: Secondary | ICD-10-CM | POA: Diagnosis not present

## 2019-10-26 ENCOUNTER — Ambulatory Visit (INDEPENDENT_AMBULATORY_CARE_PROVIDER_SITE_OTHER): Payer: Medicare HMO | Admitting: Nurse Practitioner

## 2019-10-26 DIAGNOSIS — Z7901 Long term (current) use of anticoagulants: Secondary | ICD-10-CM

## 2019-10-26 DIAGNOSIS — I480 Paroxysmal atrial fibrillation: Secondary | ICD-10-CM

## 2019-10-26 DIAGNOSIS — D6859 Other primary thrombophilia: Secondary | ICD-10-CM

## 2019-10-26 LAB — POCT INR: INR: 1.7 — AB (ref 2.0–3.0)

## 2019-10-26 NOTE — Progress Notes (Signed)
  Patients INR 1.7 at nurse visit today in addition to elevated blood pressure.  I was consulted by the nurse and it was discovered that the patient had been taking 6mg  M-F and 9mg  Sat and Sun. The most recent result note states dosing should be as listed below, which could indicate why the patients levels are not where they should be. The patient was provided with a written explanation of how to properly take the medication at this time.   Recommendation to take the medication as follows  Saturday - 7.5mg  Sunday- 5mg  Monday- 7.5mg  Tuesday- 5mg  Wednesday- 7.5mg  Thursday-5mg  Friday- 7.5mg   Follow-up for Same Dose Recheck in 1 week.    Recheck of patients blood pressure was improved. The patient had recent medication changes for hypertension by cardiology. The patient was asymptomatic with elevated blood pressures and reports acceptable blood pressure readings at home.It was recommended the patient continue to monitor her blood pressure at home and to contact her cardiologist for recommendations on medication adjustment for elevated readings.

## 2019-10-26 NOTE — Patient Instructions (Signed)
Follow Up in 1 week for INR monitoring.

## 2019-11-02 ENCOUNTER — Ambulatory Visit (INDEPENDENT_AMBULATORY_CARE_PROVIDER_SITE_OTHER): Payer: Medicare HMO | Admitting: Osteopathic Medicine

## 2019-11-02 DIAGNOSIS — D6859 Other primary thrombophilia: Secondary | ICD-10-CM

## 2019-11-02 DIAGNOSIS — Z7901 Long term (current) use of anticoagulants: Secondary | ICD-10-CM

## 2019-11-09 ENCOUNTER — Ambulatory Visit: Payer: Medicare HMO

## 2019-11-09 ENCOUNTER — Ambulatory Visit (INDEPENDENT_AMBULATORY_CARE_PROVIDER_SITE_OTHER): Payer: Medicare HMO | Admitting: Osteopathic Medicine

## 2019-11-09 DIAGNOSIS — D6859 Other primary thrombophilia: Secondary | ICD-10-CM

## 2019-11-09 DIAGNOSIS — Z7901 Long term (current) use of anticoagulants: Secondary | ICD-10-CM | POA: Diagnosis not present

## 2019-11-09 DIAGNOSIS — I4891 Unspecified atrial fibrillation: Secondary | ICD-10-CM | POA: Diagnosis not present

## 2019-11-09 LAB — POCT INR: INR: 1.4 — AB (ref 2–3)

## 2019-11-09 NOTE — Patient Instructions (Signed)
Take Fri: 7.5 mg Sat: 6 mg Sun: 6 mg Mon: 7.5 mg  Tue: 6 mg  Wed: 6 mg Thu: 6 mg  Fri - recheck INR

## 2019-11-09 NOTE — Progress Notes (Signed)
   Subjective:    Patient ID: Gloria Mckay, female    DOB: April 14, 1941, 79 y.o.   MRN: [id number]  HPI Patient here for INR check. No complaints of bleeding issues, palpitations or chest pain.  Did drop a container of sprite on her left foot and it is swelling, was bruised and red per the patient.  KG LPN   Review of Systems     Objective:   Physical Exam        Assessment & Plan:

## 2019-11-14 DIAGNOSIS — T8459XD Infection and inflammatory reaction due to other internal joint prosthesis, subsequent encounter: Secondary | ICD-10-CM | POA: Diagnosis not present

## 2019-11-14 DIAGNOSIS — D803 Selective deficiency of immunoglobulin G [IgG] subclasses: Secondary | ICD-10-CM | POA: Diagnosis not present

## 2019-11-14 DIAGNOSIS — I10 Essential (primary) hypertension: Secondary | ICD-10-CM | POA: Diagnosis not present

## 2019-11-14 DIAGNOSIS — Z86711 Personal history of pulmonary embolism: Secondary | ICD-10-CM | POA: Diagnosis not present

## 2019-11-14 DIAGNOSIS — Z96659 Presence of unspecified artificial knee joint: Secondary | ICD-10-CM | POA: Diagnosis not present

## 2019-11-14 DIAGNOSIS — M7989 Other specified soft tissue disorders: Secondary | ICD-10-CM | POA: Diagnosis not present

## 2019-11-14 DIAGNOSIS — D46C Myelodysplastic syndrome with isolated del(5q) chromosomal abnormality: Secondary | ICD-10-CM | POA: Diagnosis not present

## 2019-11-16 ENCOUNTER — Ambulatory Visit (INDEPENDENT_AMBULATORY_CARE_PROVIDER_SITE_OTHER): Payer: Medicare HMO | Admitting: Osteopathic Medicine

## 2019-11-16 DIAGNOSIS — Z7901 Long term (current) use of anticoagulants: Secondary | ICD-10-CM

## 2019-11-16 DIAGNOSIS — D6859 Other primary thrombophilia: Secondary | ICD-10-CM

## 2019-11-16 DIAGNOSIS — I480 Paroxysmal atrial fibrillation: Secondary | ICD-10-CM | POA: Diagnosis not present

## 2019-11-16 LAB — POCT INR: INR: 2.1 (ref 2.0–3.0)

## 2019-11-19 ENCOUNTER — Other Ambulatory Visit: Payer: Self-pay | Admitting: Sports Medicine

## 2019-11-19 DIAGNOSIS — M48061 Spinal stenosis, lumbar region without neurogenic claudication: Secondary | ICD-10-CM

## 2019-11-19 NOTE — Progress Notes (Signed)
INR at goal! Very labile INR, I thought she was getting INR check w/ home health? Let's revisit this, and also they can keep an eye on BP

## 2019-11-20 ENCOUNTER — Ambulatory Visit (INDEPENDENT_AMBULATORY_CARE_PROVIDER_SITE_OTHER): Payer: Medicare HMO | Admitting: Licensed Clinical Social Worker

## 2019-11-20 DIAGNOSIS — F4322 Adjustment disorder with anxiety: Secondary | ICD-10-CM | POA: Diagnosis not present

## 2019-11-20 NOTE — Progress Notes (Signed)
Virtual Visit via Telephone Note  I connected with [name] on 11/20/19 at 11:00 AM EDT by telephone and verified that I am speaking with the correct person using two identifiers.   I discussed the limitations, risks, security and privacy concerns of performing an evaluation and management service by telephone and the availability of in person appointments. I also discussed with the patient that there may be a patient responsible charge related to this service. The patient expressed understanding and agreed to proceed.   I discussed the assessment and treatment plan with the patient. The patient was provided an opportunity to ask questions and all were answered. The patient agreed with the plan and demonstrated an understanding of the instructions.   The patient was advised to call back or seek an in-person evaluation if the symptoms worsen or if the condition fails to improve as anticipated.  I provided 52 minutes of non-face-to-face time during this encounter.  THERAPIST PROGRESS NOTE  Session Time: 11:00 AM to 11:52 AM  Participation Level: Active  Behavioral Response: CasualAlertAnxious and Euthymic  Type of Therapy: Individual Therapy  Treatment Goals addressed:   Anxiety, stress management, depession  Interventions: Motivational Interviewing, Solution Focused, Strength-based, Supportive and Other: coping  Summary: Javae Braaten is a 79 y.o. female who presents with in new apartment. Feels great and describes it as beautiful. Daughter worked so hard and patient could hardly do anything. Having physical problems with diverticulitis may be shaky and weak during session, leg swelling-moved up appointment with infectious disease doctor although no infection. So many dramas. Daughter lost her best friend to lung cancer. Daughter's stepson had an OD. He is in early 35's. A friend of patient's went to church at [place] part of that ministry. oldest son years ago shot himself. Older brother went  to [place] and shot himself at 89. Patient thinks youngest son bipolar. He needs help. Beginning to open up as a parent so patient thinks she has got to approach him. Get him to understand why are you like this, do you want to leave this world with this type of personality? Doesn't know how to approach him. He is beginning to recognize. He himself has said that he did not want to be that way. Discussed having him think about his Legend. Her husband left a legend with his singing. What is the legend you can leave to people? Patient's legend is that loves the lord and share that with people. Patient's loves to cook. Would take men who didn't like to cook and bring them her soup. Love to do things for other people when can, if health is good enough and strong enough. Wants to teach preschooler to read. Otherwise they will get behind. Got to get herself better  health wise.  Can have people come in. 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 But has to listen to her body, cancer in blood marrow can turn into leukemia checking that Monday, can be tired sometimes. A lot better at new place. A big difference with family coming to see her. Doctor with swelling wants the leg to be elevated. Blood pressure up and INR not therapeutic. Has to settle down for a little bit.     Therapist pointed out her signs on recognition of problems may be an opening for patient to help him look at  Suicidal/Homicidal: No  Therapist Response: Reviewed symptoms patient has made good progress on treatment goals of decrease in anxiety and stress in a significant reason  for this is moving to a new apartment.  This had been one of the main causes of mental health symptoms that led the first place to therapy where she was living at a place with too many restrictions.  In this session addressed stressor of her son, discussed ways to approach him and agreed with patient of talking about what legend he wants to leave will get him to think  more about his impact on others, more about the type of person he wants to be when he thinks about what has been positive about his life.  Also good to approach him with identifying his strengths realizing he wants to apply the strengths and other areas of his life.  Therapist reviewed significant aspect of life is a relationships, reflecting on life will be important to see how much we valued and took care of her relationship and help her son to see that.  Also to manage his own mental health will help him see life is better for himself.  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  Assessed patient drive  also helps with her functioning, willingness to engage in activities that improve health, agreed with patient to prioritize taking care of medical issues so she can get to other goals.  Reviewed the patient has made progress and will discontinue therapy but if needed and addressing stressors son will contact therapist  Plan: 1.no more sessions are needed currently, patient will follow up with appointment continues to have stressors with son. 2.  Patient continue to implement effective coping strategies to help maintain stability with mood.  Diagnosis: Axis I: adjustment disorder with anxious mood    Axis II: No diagnosis    Cordella Register, LCSW 11/20/2019

## 2019-11-26 DIAGNOSIS — Z86718 Personal history of other venous thrombosis and embolism: Secondary | ICD-10-CM | POA: Diagnosis not present

## 2019-11-26 DIAGNOSIS — N184 Chronic kidney disease, stage 4 (severe): Secondary | ICD-10-CM | POA: Diagnosis not present

## 2019-11-26 DIAGNOSIS — R2243 Localized swelling, mass and lump, lower limb, bilateral: Secondary | ICD-10-CM | POA: Diagnosis not present

## 2019-11-26 DIAGNOSIS — D46C Myelodysplastic syndrome with isolated del(5q) chromosomal abnormality: Secondary | ICD-10-CM | POA: Diagnosis not present

## 2019-11-26 DIAGNOSIS — R600 Localized edema: Secondary | ICD-10-CM | POA: Diagnosis not present

## 2019-11-27 ENCOUNTER — Telehealth (INDEPENDENT_AMBULATORY_CARE_PROVIDER_SITE_OTHER): Payer: Medicare HMO | Admitting: Osteopathic Medicine

## 2019-11-27 ENCOUNTER — Encounter: Payer: Self-pay | Admitting: Osteopathic Medicine

## 2019-11-27 VITALS — Temp 97.6°F | Wt 122.0 lb

## 2019-11-27 DIAGNOSIS — R0600 Dyspnea, unspecified: Secondary | ICD-10-CM

## 2019-11-27 DIAGNOSIS — R600 Localized edema: Secondary | ICD-10-CM

## 2019-11-27 DIAGNOSIS — R0609 Other forms of dyspnea: Secondary | ICD-10-CM

## 2019-11-27 DIAGNOSIS — D649 Anemia, unspecified: Secondary | ICD-10-CM

## 2019-11-27 NOTE — Progress Notes (Addendum)
Virtual Visit via Phone I connected with      Gloria Mckay on 11/27/19 at 1:48 PM  by a telemedicine application and verified that I am speaking with the correct person using two identifiers.  Patient is at home  I am in office   I discussed the limitations of evaluation and management by telemedicine and the availability of in person appointments. The patient expressed understanding and agreed to proceed.  History of Present Illness: Gloria Mckay is a 79 y.o. female who would like to discuss LE swelling   Saw Dr Tressie Stalker yesterday  Records reviewed: LLE swelling over few mos, 16 lb weight gain, (+)SOB on exertion but this is not new <-- pt confirms this. On physical exam: no obvious cardiac gallop, Lungs CTAB, no JVD No DVT on [place]   [place] 11/26/2019 "IMPRESSION:  No evidence of deep venous thrombosis bilateral lower extremities. Subcutaneous edema visualized left lower leg. Visualization of the posterior tibial veins is limited although grossly unremarkable."        Observations/Objective: Temp 97.6 F (36.4 C) (Oral)   Wt 122 lb (55.3 kg)   BMI 21.61 kg/m  BP Readings from Last 3 Encounters:  11/16/19 (!) 153/72  11/09/19 (!) 118/56  11/02/19 (!) 150/57   Exam: Normal Speech.  NAD  Lab and Radiology Results No results found for this or any previous visit (from the past 72 hour(s)). No results found.     Assessment and Plan: 79 y.o. female with The primary encounter diagnosis was Lower extremity edema. A diagnosis of Dyspnea on exertion was also pertinent to this visit.  Pt reports Dr Delane Ginger recommended "urgent" cardiology referral, pt tried to contact her cardiologist and it's weeks until available appt. I think we can get BNP, basic labs and Echo though I would agree that CHF seems unlikely with unilateral leg sweling and no other criteria met on exam; she's already had neagative w/u for infectious cause or DVT. Likely dependent edema exacerbated by previous issues w/  that leg / surgical cimplications.   PDMP not reviewed this encounter. Orders Placed This Encounter  Procedures  . CBC  . COMPLETE METABOLIC PANEL WITH GFR  . TSH  . B Nat Peptide  . ECHOCARDIOGRAM COMPLETE    Standing Status:   Future    Standing Expiration Date:   02/26/2021    Order Specific Question:   Where should this test be performed    Answer:   [facility]    Order Specific Question:   Perflutren DEFINITY (image enhancing agent) should be administered unless hypersensitivity or allergy exist    Answer:   Administer Perflutren    Order Specific Question:   Reason for exam-Echo    Answer:   Dyspnea  786.09 / R06.00   No orders of the defined types were placed in this encounter.    ADDENDUM 11/29/19 4:53 PM    Called and spoke to patient, see result note.  Advised that anemia was significantly worse and given her dyspnea on exertion, this was likely the cause of her symptoms.  Given symptomatic anemia, I advised that going to the ER to confirm the lab abnormality and to evaluate need for a blood transfusion was probably her best option, particularly the given that she is on blood thinners.  I am unable to really assess for a GI bleed at this time, we should strongly consider stopping her anticoagulation.  Patient was reluctant to go to the emergency department or stop  coumadin.  I think it would be reasonable, though risky, to wait through the weekend to see if we could arrange an outpatient blood transfusion and workup for GI bleed.  I strongly advised the patient if she is feeling worse she should go to the emergency department/call 911.  I did also reach out to her hematology/oncology office, to make them aware of the anemia.  I am not sure if they would be able to arrange a blood transfusion outpatient but hopefully they could take care of that or direct the patient to appropriate services.     Follow Up Instructions: Return for RECHECK PENDING LAB/ECHO RESULTS / IF  WORSE OR CHANGE.    I discussed the assessment and treatment plan with the patient. The patient was provided an opportunity to ask questions and all were answered. The patient agreed with the plan and demonstrated an understanding of the instructions.   The patient was advised to call back or seek an in-person evaluation if any new concerns, if symptoms worsen or if the condition fails to improve as anticipated.  30 minutes of non-face-to-face time was provided during this encounter.      . . . . . . . . . . . . . Marland Kitchen                   Historical information moved to improve visibility of documentation.  Past Medical History:  Diagnosis Date  . Arthritis   . Cancer ([place])   . Cataract    bilaterally  . Hypertension   . Myelodysplasia (myelodysplastic syndrome) ([place])   . Myelodysplastic syndrome [facility])    Past Surgical History:  Procedure Laterality Date  . JOINT REPLACEMENT    . STOMACH SURGERY     Social History   Tobacco Use  . Smoking status: Never Smoker  . Smokeless tobacco: Never Used  Substance Use Topics  . Alcohol use: No   family history includes Heart disease in her father; Hypertension in her mother; Pulmonary embolism in her mother.  Medications: Current Outpatient Medications  Medication Sig Dispense Refill  . AMBULATORY NON FORMULARY MEDICATION Extend home health PT to include left shoulder and right leg.  Left rotator cuff injury and contusion right leg 1 each 0  . amLODipine (NORVASC) 10 MG tablet Take 1 tablet (10 mg total) by mouth daily. 90 tablet 3  . Calcium Carbonate-Vitamin D 600-400 MG-UNIT chew tablet Chew 2 tablets by mouth daily. 60 tablet 12  . carvedilol (COREG) 25 MG tablet Take 1 tablet (25 mg total) by mouth 2 (two) times daily with a meal. 180 tablet 0  . Cholecalciferol (VITAMIN D3) 50000 units CAPS Take 1 tablet by mouth daily.  1  . clindamycin (CLEOCIN) 300 MG capsule TAKE 1 CAPSULE BY MOUTH TWICE A DAY     . clobetasol ointment (TEMOVATE) 0.05 % Apply to affected area daily and then QOD 30 g 6  . denosumab (PROLIA) 60 MG/ML SOSY injection Inject 60 mg into the skin every 6 (six) months. Administer in upper arm, thigh, or abdomen 1 Syringe 1  . hydrALAZINE (APRESOLINE) 25 MG tablet Take 1 tablet (25 mg total) by mouth 2 (two) times daily. 180 tablet 3  . HYDROcodone-acetaminophen (NORCO) 10-325 MG tablet Take 1 tablet by mouth 2 (two) times daily. 60 tablet 0  . losartan (COZAAR) 100 MG tablet Take 1 tablet (100 mg total) by mouth daily. 90 tablet 1  . mometasone (ELOCON) 0.1 % cream Apply 1  application topically daily.    . mupirocin ointment (BACTROBAN) 2 % Place 1 application into the nose 2 (two) times daily.    . Omega-3 Fatty Acids ([employer] FISH OIL PO) Take by mouth.    . Prasterone (INTRAROSA) 6.5 MG INST Place 1 suppository vaginally at bedtime. 30 each 12  . topiramate (TOPAMAX) 100 MG tablet Take 100 mg by mouth 2 (two) times daily.    . Vitamin D, Ergocalciferol, (DRISDOL) 1.25 MG (50000 UT) CAPS capsule Take 1 capsule (50,000 Units total) by mouth every 7 (seven) days. 12 capsule 1  . warfarin (COUMADIN) 4 MG tablet Take 1 tablet (4 mg total) by mouth daily. 30 tablet 1  . warfarin (COUMADIN) 6 MG tablet Take 1 tablet (6 mg total) by mouth daily. 90 tablet 1  . enoxaparin (LOVENOX) 30 MG/0.3ML injection Inject 0.3 mLs (30 mg total) into the skin daily for 7 days. 10 Syringe 0   No current facility-administered medications for this visit.   Allergies  Allergen Reactions  . Avocado Shortness Of Breath  . Cefuroxime Anaphylaxis    Difficulty swallowing pills because they were too dry.  Caused tablet dysphagia.  . Diazepam Shortness Of Breath    HYPERVENTILATION     . Diphenhydramine Palpitations    Affected heart rate/er told her not to take again  . Latex Rash and Shortness Of Breath    Airway swelling  . Other Palpitations    Affected heart rate/er told her not to take again  .  Amlodipine Cough and Other (See Comments)    Not sure  . Butorphanol Hives  . Diclofenac Hives  . Diclofenac Sodium Hives  . Diphenhydramine Hcl Palpitations    Affected heart rate/er told her not to take again  . Methylpyrrolidone Hives  . Ondansetron Hives and Other (See Comments)    Sedates/knocks her out    . Oxycodone Hives  . Penicillins Hives and Swelling    Most mycin drugs  . Quinolones Other (See Comments)    Joint pain  . Sulfa Antibiotics Hives and Swelling  . Butorphanol Tartrate Hives  . Lisinopril Cough    Caused pt to cough  . Ramipril Cough    Pt c/o cough  . Ace Inhibitors Other (See Comments) and Cough    Lisinopril and ramipril   . Ciprofloxacin Rash    Patient prefers not to take Fluoroquinolones   . Codeine Nausea Only  . Pantoprazole Sodium Diarrhea

## 2019-11-28 ENCOUNTER — Other Ambulatory Visit: Payer: Self-pay | Admitting: Osteopathic Medicine

## 2019-11-28 DIAGNOSIS — R600 Localized edema: Secondary | ICD-10-CM | POA: Diagnosis not present

## 2019-11-28 DIAGNOSIS — R0600 Dyspnea, unspecified: Secondary | ICD-10-CM | POA: Diagnosis not present

## 2019-11-28 DIAGNOSIS — H40013 Open angle with borderline findings, low risk, bilateral: Secondary | ICD-10-CM | POA: Diagnosis not present

## 2019-11-29 ENCOUNTER — Other Ambulatory Visit: Payer: Self-pay | Admitting: Osteopathic Medicine

## 2019-11-29 DIAGNOSIS — I10 Essential (primary) hypertension: Secondary | ICD-10-CM

## 2019-11-29 LAB — COMPLETE METABOLIC PANEL WITH GFR
AG Ratio: 1.8 (calc) (ref 1.0–2.5)
ALT: 16 U/L (ref 6–29)
AST: 17 U/L (ref 10–35)
Albumin: 3.7 g/dL (ref 3.6–5.1)
Alkaline phosphatase (APISO): 104 U/L (ref 37–153)
BUN/Creatinine Ratio: 16 (calc) (ref 6–22)
BUN: 26 mg/dL — ABNORMAL HIGH (ref 7–25)
CO2: 15 mmol/L — ABNORMAL LOW (ref 20–32)
Calcium: 8.3 mg/dL — ABNORMAL LOW (ref 8.6–10.4)
Chloride: 120 mmol/L — ABNORMAL HIGH (ref 98–110)
Creat: 1.65 mg/dL — ABNORMAL HIGH (ref 0.60–0.93)
GFR, Est African American: 34 mL/min/{1.73_m2} — ABNORMAL LOW (ref >=60)
GFR, Est Non African American: 30 mL/min/{1.73_m2} — ABNORMAL LOW (ref >=60)
Globulin: 2.1 g/dL (calc) (ref 1.9–3.7)
Glucose, Bld: 88 mg/dL (ref 65–99)
Potassium: 4.6 mmol/L (ref 3.5–5.3)
Sodium: 144 mmol/L (ref 135–146)
Total Bilirubin: 0.2 mg/dL (ref 0.2–1.2)
Total Protein: 5.8 g/dL — ABNORMAL LOW (ref 6.1–8.1)

## 2019-11-29 LAB — CBC
HCT: 25.5 % — ABNORMAL LOW (ref 35.0–45.0)
Hemoglobin: 7.4 g/dL — ABNORMAL LOW (ref 11.7–15.5)
MCH: 24.7 pg — ABNORMAL LOW (ref 27.0–33.0)
MCHC: 29 g/dL — ABNORMAL LOW (ref 32.0–36.0)
MCV: 85.3 fL (ref 80.0–100.0)
MPV: 10.8 fL (ref 7.5–12.5)
Platelets: 272 10*3/uL (ref 140–400)
RBC: 2.99 10*6/uL — ABNORMAL LOW (ref 3.80–5.10)
RDW: 16.2 % — ABNORMAL HIGH (ref 11.0–15.0)
WBC: 7 10*3/uL (ref 3.8–10.8)

## 2019-11-29 LAB — BRAIN NATRIURETIC PEPTIDE: Brain Natriuretic Peptide: 328 pg/mL — ABNORMAL HIGH (ref <100)

## 2019-11-29 LAB — TSH: TSH: 1.38 mIU/L (ref 0.40–4.50)

## 2019-11-29 MED ORDER — HYDRALAZINE HCL 25 MG PO TABS: 25.0000 mg | ORAL_TABLET | Freq: Two times a day (BID) | ORAL | 3 refills | Status: DC

## 2019-11-29 NOTE — Addendum Note (Signed)
Addended by: Maryla Morrow on: 11/29/2019 04:58 PM   Modules accepted: Orders

## 2019-11-29 NOTE — Telephone Encounter (Signed)
[place] requesting med refill for hydroxyzine. Rx not listed in active med list.

## 2019-12-03 ENCOUNTER — Other Ambulatory Visit: Payer: Self-pay

## 2019-12-03 ENCOUNTER — Ambulatory Visit (HOSPITAL_BASED_OUTPATIENT_CLINIC_OR_DEPARTMENT_OTHER)
Admission: RE | Admit: 2019-12-03 | Discharge: 2019-12-03 | Disposition: A | Discharge status: Home / Self Care | Payer: Medicare HMO | Source: Ambulatory Visit | Attending: Osteopathic Medicine | Admitting: Osteopathic Medicine

## 2019-12-03 DIAGNOSIS — R600 Localized edema: Secondary | ICD-10-CM

## 2019-12-03 NOTE — Progress Notes (Signed)
  Echocardiogram 2D Echocardiogram has been performed.  Gloria Mckay 12/03/2019, 3:50 PM

## 2019-12-07 ENCOUNTER — Ambulatory Visit (INDEPENDENT_AMBULATORY_CARE_PROVIDER_SITE_OTHER): Payer: Medicare HMO | Admitting: Medical-Surgical

## 2019-12-07 ENCOUNTER — Other Ambulatory Visit: Payer: Self-pay

## 2019-12-07 DIAGNOSIS — Z7901 Long term (current) use of anticoagulants: Secondary | ICD-10-CM

## 2019-12-07 DIAGNOSIS — H5213 Myopia, bilateral: Secondary | ICD-10-CM | POA: Diagnosis not present

## 2019-12-07 DIAGNOSIS — H524 Presbyopia: Secondary | ICD-10-CM | POA: Diagnosis not present

## 2019-12-07 DIAGNOSIS — I4891 Unspecified atrial fibrillation: Secondary | ICD-10-CM

## 2019-12-07 DIAGNOSIS — H52209 Unspecified astigmatism, unspecified eye: Secondary | ICD-10-CM | POA: Diagnosis not present

## 2019-12-07 DIAGNOSIS — D6859 Other primary thrombophilia: Secondary | ICD-10-CM

## 2019-12-07 LAB — POCT INR: INR: 1.4 — AB (ref 2.0–3.0)

## 2019-12-07 NOTE — Progress Notes (Signed)
Today's INR check 1.4. Previous INR 2.2 last week with 1.4 the week before on the same dose. Patient denies changes in dietary consumption, missed doses. Fluctuations from subtherapeutic to therapeutic and back on the same dose suspicious for dietary interference. Patient may be consuming foods without being aware that they are high in vitamin K. Will send food list to patient and ask her to be vigilant with her intake. Continue same dose and check INR in 1 week.

## 2019-12-07 NOTE — Patient Instructions (Signed)
Vitamin K Foods and Warfarin Warfarin is a blood thinner (anticoagulant). Anticoagulant medicines help prevent the formation of blood clots. These medicines work by decreasing the activity of vitamin K, which promotes normal blood clotting. When you take warfarin, problems can occur from suddenly increasing or decreasing the amount of vitamin K that you eat from one day to the next. Problems may include:  Blood clots.  Bleeding. What general guidelines do I need to follow? To avoid problems when taking warfarin:  Eat a balanced diet that includes: ? Fresh fruits and vegetables. ? Whole grains. ? Low-fat dairy products. ? Lean proteins, such as fish, eggs, and lean cuts of meat.  Keep your intake of vitamin K consistent from day to day. To do this: ? Avoid eating large amounts of vitamin K one day and low amounts of vitamin K the next day. ? If you take a multivitamin that contains vitamin K, be sure to take it every day. ? Know which foods contain vitamin K. Use the lists below to understand serving sizes and the amount of vitamin K in one serving.  Avoid major changes in your diet. If you are going to change your diet, talk with your health care provider before making changes.  Work with a nutrition specialist (dietitian) to develop a meal plan that works best for you.  High vitamin K foods Foods that are high in vitamin K contain more than 100 mcg (micrograms) per serving. These include:  Broccoli (cooked) -  cup has 110 mcg.  Brussels sprouts (cooked) -  cup has 109 mcg.  Greens, beet (cooked) -  cup has 350 mcg.  Greens, collard (cooked) -  cup has 418 mcg.  Greens, turnip (cooked) -  cup has 265 mcg.  Green onions or scallions -  cup has 105 mcg.  Kale (fresh or frozen) -  cup has 531 mcg.  Parsley (raw) - 10 sprigs has 164 mcg.  Spinach (cooked) -  cup has 444 mcg.  Swiss chard (cooked) -  cup has 287 mcg. Moderate vitamin K foods Foods that have a  moderate amount of vitamin K contain 25-100 mcg per serving. These include:  Asparagus (cooked) - 5 spears have 38 mcg.  Black-eyed peas (dried) -  cup has 32 mcg.  Cabbage (cooked) -  cup has 37 mcg.  Kiwi fruit - 1 medium has 31 mcg.  Lettuce - 1 cup has 57-63 mcg.  Okra (frozen) -  cup has 44 mcg.  Prunes (dried) - 5 prunes have 25 mcg.  Watercress (raw) - 1 cup has 85 mcg. Low vitamin K foods Foods low in vitamin K contain less than 25 mcg per serving. These include:  Artichoke - 1 medium has 18 mcg.  Avocado - 1 oz. has 6 mcg.  Blueberries -  cup has 14 mcg.  Cabbage (raw) -  cup has 21 mcg.  Carrots (cooked) -  cup has 11 mcg.  Cauliflower (raw) -  cup has 11 mcg.  Cucumber with peel (raw) -  cup has 9 mcg.  Grapes -  cup has 12 mcg.  Mango - 1 medium has 9 mcg.  Nuts - 1 oz. has 15 mcg.  Pear - 1 medium has 8 mcg.  Peas (cooked) -  cup has 19 mcg.  Pickles - 1 spear has 14 mcg.  Pumpkin seeds - 1 oz. has 13 mcg.  Sauerkraut (canned) -  cup has 16 mcg.  Soybeans (cooked) -  cup has 16 mcg.    Tomato (raw) - 1 medium has 10 mcg.  Tomato sauce -  cup has 17 mcg. Vitamin K-free foods If a food contain less than 5 mcg per serving, it is considered to have no vitamin K. These foods include:  Bread and cereal products.  Cheese.  Eggs.  Fish and shellfish.  Meat and poultry.  Milk and dairy products.  Sunflower seeds. Actual amounts of vitamin K in foods may be different depending on processing. Talk with your dietitian about what foods you can eat and what foods you should avoid. This information is not intended to replace advice given to you by your health care provider. Make sure you discuss any questions you have with your health care provider. Document Revised: 07/29/2017 Document Reviewed: 11/19/2015 Elsevier Patient Education  2020 Elsevier Inc.  

## 2019-12-07 NOTE — Progress Notes (Signed)
Patient advised.

## 2019-12-14 ENCOUNTER — Ambulatory Visit (INDEPENDENT_AMBULATORY_CARE_PROVIDER_SITE_OTHER): Payer: Medicare HMO | Admitting: Family Medicine

## 2019-12-14 ENCOUNTER — Other Ambulatory Visit: Payer: Self-pay

## 2019-12-14 ENCOUNTER — Telehealth: Payer: Self-pay

## 2019-12-14 DIAGNOSIS — I4891 Unspecified atrial fibrillation: Secondary | ICD-10-CM

## 2019-12-14 DIAGNOSIS — Z7901 Long term (current) use of anticoagulants: Secondary | ICD-10-CM

## 2019-12-14 DIAGNOSIS — D6859 Other primary thrombophilia: Secondary | ICD-10-CM | POA: Diagnosis not present

## 2019-12-14 NOTE — Telephone Encounter (Signed)
Pt presented with her daughter for INR check. Per pt, provider was to send in a referral to Oncologist for abdomen scan. Pls advise, thanks.

## 2019-12-18 ENCOUNTER — Other Ambulatory Visit: Payer: Self-pay | Admitting: Osteopathic Medicine

## 2019-12-18 NOTE — Telephone Encounter (Signed)
She has an oncologist, I am not sure what type of scan she would be talking about?  I spoke to her, we had significant concern about anemia, need to rule out GI bleed but patient declined ER transfer.  I reached out to her hematology/oncology office to make them aware of anemia and see if they could arrange outpatient transfusion/work-up.  Please advise her to call her oncologist, Pieter Partridge, MDto follow up on severe anemia.   [phone]    5 mins spent t

## 2019-12-20 ENCOUNTER — Other Ambulatory Visit: Payer: Self-pay | Admitting: Sports Medicine

## 2019-12-20 DIAGNOSIS — M48061 Spinal stenosis, lumbar region without neurogenic claudication: Secondary | ICD-10-CM

## 2019-12-20 NOTE — Telephone Encounter (Signed)
Routing to provider. [place] requesting med refill for warfarin 7.5 mg. Rx not listed in active med list.

## 2019-12-20 NOTE — Telephone Encounter (Signed)
Left a detailed vm msg for pt's daughter regarding provider's note. Aware to contact the office for additional inquiries. Direct call back info provided.

## 2019-12-21 ENCOUNTER — Other Ambulatory Visit: Payer: Self-pay

## 2019-12-21 ENCOUNTER — Ambulatory Visit (INDEPENDENT_AMBULATORY_CARE_PROVIDER_SITE_OTHER): Payer: Medicare HMO | Admitting: Family Medicine

## 2019-12-21 DIAGNOSIS — I4891 Unspecified atrial fibrillation: Secondary | ICD-10-CM | POA: Diagnosis not present

## 2019-12-21 DIAGNOSIS — Z7901 Long term (current) use of anticoagulants: Secondary | ICD-10-CM | POA: Diagnosis not present

## 2019-12-21 DIAGNOSIS — D6859 Other primary thrombophilia: Secondary | ICD-10-CM | POA: Diagnosis not present

## 2019-12-21 LAB — POCT INR: INR: 2.6 (ref 2.0–3.0)

## 2019-12-21 MED ORDER — WARFARIN SODIUM 7.5 MG PO TABS: 7.5000 mg | ORAL_TABLET | Freq: Every day | ORAL | 0 refills | Status: DC

## 2019-12-21 NOTE — Telephone Encounter (Signed)
Okay to refill, please confirm with patient that this was requested and not an automatic refill, I thought that she was not taking this dose, can we confirm Coumadin dose and next INR check?  Thanks!

## 2019-12-21 NOTE — Patient Instructions (Signed)
Continue Coumadin 7.5 mg daily. Follow up for repeat INR in 2 weeks.

## 2019-12-21 NOTE — Telephone Encounter (Signed)
Rx not listed in pt's active med list. Pt had INR check on 12/14/19, results were out of range. Per Dr. Zigmund Daniel, anticoagulation was changed to 7.5 mg daily. She was informed since dose was changed to come in at 1 week to recheck INR levels.

## 2019-12-21 NOTE — Progress Notes (Signed)
Patient reports taking Coumadin 7.5 mg daily for the last week since her INR check. She reports no missed doses of meds, extra doses, bleeding gums, nose bleeds, antibiotic use, hospitalization, blood in urine, blood in stool, dental procedures, any bruising, abnormal bleeding, or med changes.   INR today is 2.6.  She was instructed to continue 7.5 mg daily and follow up in two weeks for repeat INR check. She does have a follow up appointment on Wednesday with Dr. Sheppard Coil. New prescription for 7.5 mg tablets sent to pharmacy.    Patient also complained about intermittent bilateral ear pain for quite awhile. After examination, she did have a buildup of wax in both ears. They were irrigated and patient expressed relief of some pain almost immediately. She will let us know if anything changes.

## 2019-12-26 ENCOUNTER — Other Ambulatory Visit: Payer: Self-pay | Admitting: Osteopathic Medicine

## 2019-12-26 ENCOUNTER — Encounter: Payer: Self-pay | Admitting: Osteopathic Medicine

## 2019-12-26 ENCOUNTER — Other Ambulatory Visit: Payer: Self-pay

## 2019-12-26 ENCOUNTER — Ambulatory Visit (INDEPENDENT_AMBULATORY_CARE_PROVIDER_SITE_OTHER): Payer: Medicare HMO | Admitting: Osteopathic Medicine

## 2019-12-26 VITALS — BP 153/72 | HR 57 | Temp 98.1°F | Wt 117.1 lb

## 2019-12-26 DIAGNOSIS — D649 Anemia, unspecified: Secondary | ICD-10-CM

## 2019-12-26 DIAGNOSIS — I480 Paroxysmal atrial fibrillation: Secondary | ICD-10-CM

## 2019-12-26 DIAGNOSIS — Z1211 Encounter for screening for malignant neoplasm of colon: Secondary | ICD-10-CM

## 2019-12-26 DIAGNOSIS — M48061 Spinal stenosis, lumbar region without neurogenic claudication: Secondary | ICD-10-CM | POA: Diagnosis not present

## 2019-12-26 LAB — POCT INR: INR: 2.9 (ref 2.0–3.0)

## 2019-12-26 MED ORDER — TOPIRAMATE 100 MG PO TABS: ORAL_TABLET | ORAL | 1 refills | Status: AC

## 2019-12-26 MED ORDER — HYDROCODONE-ACETAMINOPHEN 10-325 MG PO TABS: 1.0000 | ORAL_TABLET | Freq: Two times a day (BID) | ORAL | 0 refills | Status: DC | PRN

## 2019-12-26 MED ORDER — HYDROXYZINE HCL 25 MG PO TABS: 12.5000 mg | ORAL_TABLET | Freq: Three times a day (TID) | ORAL | 0 refills | Status: DC | PRN

## 2019-12-26 NOTE — Progress Notes (Signed)
Gloria Mckay is a 79 y.o. female who presents to  [place] at [facility]  today, 12/28/19, seeking care for the following: . INR check . Fatigue . Med check - daughter concerned about memory issues      ASSESSMENT & PLAN with other pertinent history/findings:  The primary encounter diagnosis was Paroxysmal atrial fibrillation ([place]). Diagnoses of Spinal stenosis of lumbar region without neurogenic claudication and Anemia, unspecified type were also pertinent to this visit.  Did not bring pill bottles today...   Sent lab results to [place] - ot has declined referral to [facility] facility or ER transfer for symptomatic anemia, she would like to stay on Coumadin and I think this is not unreasonable given stable anemia but frequent INR checks to ensure no supratherapeutic levels - she is prone to very labile INR levels    Patient Instructions  Plan:  Anemia:  Labs today  Stool cards at home to return to office   Will recheck w/ Dr Tressie Stalker regarding possibly blood transfusion given how tired you are, probably from the anemia. Please call his office on Friday if you haven't heard back from them!   INR 2.9 today - need to reduce dose  7.5 mg every Thursday, Friday, Saturday, Monday, Tuesday  5 mg every Wednesday, Sunday   Recheck INR as scheduled   Medications:  See list!   Let me know if any questions/concerns!    Orders Placed This Encounter  Procedures  . CBC  . Fe+TIBC+Fer  . Reticulocytes  . POCT INR    Meds ordered this encounter  Medications  . HYDROcodone-acetaminophen (NORCO) 10-325 MG tablet    Sig: Take 1 tablet by mouth 2 (two) times daily as needed.    Dispense:  60 tablet    Refill:  0  . hydrOXYzine (ATARAX/VISTARIL) 25 MG tablet    Sig: Take 0.5-1 tablets (12.5-25 mg total) by mouth every 8 (eight) hours as needed for itching.    Dispense:  30 tablet    Refill:  0  . topiramate (TOPAMAX) 100 MG tablet     Sig: Take 1.5 tabs (150 mg) in AM and 2 tabs (200 mg) in PM    Dispense:  90 tablet    Refill:  1       Follow-up instructions: Return for KEEP CURRENTLY SCHEDULED APPOINTMENT FOR INT CHECK .                                         BP (!) 153/72 (BP Location: Left Arm, Patient Position: Sitting, Cuff Size: Normal)   Pulse (!) 57   Temp 98.1 F (36.7 C) (Oral)   Wt 117 lb 1.3 oz (53.1 kg)   BMI 20.74 kg/m   Current Meds  Medication Sig  . amLODipine (NORVASC) 10 MG tablet Take 1 tablet (10 mg total) by mouth daily.  . Calcium Carbonate-Vitamin D 600-400 MG-UNIT chew tablet Chew 2 tablets by mouth daily.  . carvedilol (COREG) 25 MG tablet Take 1 tablet (25 mg total) by mouth 2 (two) times daily with a meal.  . clindamycin (CLEOCIN) 300 MG capsule TAKE 1 CAPSULE BY MOUTH TWICE A DAY  . denosumab (PROLIA) 60 MG/ML SOSY injection Inject 60 mg into the skin every 6 (six) months. Administer in upper arm, thigh, or abdomen  . hydrALAZINE (APRESOLINE) 25 MG tablet Take 1 tablet (  25 mg total) by mouth 2 (two) times daily.  Marland Kitchen HYDROcodone-acetaminophen (NORCO) 10-325 MG tablet Take 1 tablet by mouth 2 (two) times daily as needed.  . hydrOXYzine (ATARAX/VISTARIL) 25 MG tablet Take 0.5-1 tablets (12.5-25 mg total) by mouth every 8 (eight) hours as needed for itching.  . losartan (COZAAR) 100 MG tablet Take 1 tablet (100 mg total) by mouth daily.  . Omega-3 Fatty Acids ([employer] FISH OIL PO) Take by mouth.  . Prasterone (INTRAROSA) 6.5 MG INST Place 1 suppository vaginally at bedtime.  . topiramate (TOPAMAX) 100 MG tablet Take 1.5 tabs (150 mg) in AM and 2 tabs (200 mg) in PM  . Vitamin D, Ergocalciferol, (DRISDOL) 1.25 MG (50000 UT) CAPS capsule Take 1 capsule (50,000 Units total) by mouth every 7 (seven) days.  Marland Kitchen warfarin (COUMADIN) 7.5 MG tablet Take 1 tablet (7.5 mg total) by mouth daily.  . [DISCONTINUED] AMBULATORY NON FORMULARY MEDICATION Extend home health  PT to include left shoulder and right leg.  Left rotator cuff injury and contusion right leg  . [DISCONTINUED] Cholecalciferol (VITAMIN D3) 50000 units CAPS Take 1 tablet by mouth daily.  . [DISCONTINUED] clobetasol ointment (TEMOVATE) 0.05 % Apply to affected area daily and then QOD  . [DISCONTINUED] HYDROcodone-acetaminophen (NORCO) 10-325 MG tablet Take 1 tablet by mouth 2 times daily.  . [DISCONTINUED] hydrOXYzine (ATARAX/VISTARIL) 25 MG tablet Take one tablet (25 mg dose) by mouth 3 (three) times a day as needed for Itching for up to 10 days.  . [DISCONTINUED] mometasone (ELOCON) 0.1 % cream Apply 1 application topically daily.  . [DISCONTINUED] mupirocin ointment (BACTROBAN) 2 % Place 1 application into the nose 2 (two) times daily.  . [DISCONTINUED] topiramate (TOPAMAX) 100 MG tablet Take 100 mg by mouth 2 (two) times daily.  . [DISCONTINUED] warfarin (COUMADIN) 7.5 MG tablet Take 1 tablet (7.5 mg total) by mouth daily.    Results for orders placed or performed in visit on 12/26/19 (from the past 72 hour(s))  POCT INR     Status: None   Collection Time: 12/26/19  2:07 PM  Result Value Ref Range   INR 2.9 2.0 - 3.0  CBC     Status: Abnormal   Collection Time: 12/26/19  2:40 PM  Result Value Ref Range   WBC 5.9 3.8 - 10.8 Thousand/uL   RBC 3.14 (L) 3.80 - 5.10 Million/uL   Hemoglobin 7.4 (L) 11.7 - 15.5 g/dL   HCT 26.3 (L) 35.0 - 45.0 %   MCV 83.8 80.0 - 100.0 fL   MCH 23.6 (L) 27.0 - 33.0 pg   MCHC 28.1 (L) 32.0 - 36.0 g/dL   RDW 16.5 (H) 11.0 - 15.0 %   Platelets 198 140 - 400 Thousand/uL   MPV 10.1 7.5 - 12.5 fL  Fe+TIBC+Fer     Status: Abnormal   Collection Time: 12/26/19  2:40 PM  Result Value Ref Range   Iron 11 (L) 45 - 160 mcg/dL   TIBC 349 250 - 450 mcg/dL (calc)   %SAT 3 (L) 16 - 45 % (calc)   Ferritin 3 (L) 16 - 288 ng/mL  Reticulocytes     Status: None   Collection Time: 12/26/19  2:40 PM  Result Value Ref Range   Retic Ct Pct 1.0 %   ABS Retic 31,400 20,000  - 8,000 cells/uL    No results found.  Depression screen [facility] 2/9 07/10/2019 10/30/2018 07/04/2018  Decreased Interest 0 1 0  Down, Depressed, Hopeless 0 1 0  PHQ - 2 Score 0 2 0  Altered sleeping - 1 -  Tired, decreased energy - 1 -  Change in appetite - 1 -  Feeling bad or failure about yourself  - 1 -  Trouble concentrating - 1 -  Moving slowly or fidgety/restless - 1 -  Suicidal thoughts - 1 -  PHQ-9 Score - 9 -  Difficult doing work/chores - Not difficult at all -    GAD 7 : Generalized Anxiety Score 10/30/2018 02/23/2018  Nervous, Anxious, on Edge 1 0  Control/stop worrying 1 0  Worry too much - different things 1 0  Trouble relaxing 1 0  Restless 1 0  Easily annoyed or irritable 1 0  Afraid - awful might happen 1 0  Total GAD 7 Score 7 0  Anxiety Difficulty Not difficult at all -      All questions at time of visit were answered - patient instructed to contact office with any additional concerns or updates.  ER/RTC precautions were reviewed with the patient.  Please note: voice recognition software was used to produce this document, and typos may escape review. Please contact Dr. Sheppard Coil for any needed clarifications.

## 2019-12-26 NOTE — Patient Instructions (Addendum)
Plan:  Anemia:  Labs today  Stool cards at home to return to office   Will recheck w/ Dr Tressie Stalker regarding possibly blood transfusion given how tired you are, probably from the anemia. Please call his office on Friday if you haven't heard back from them!   INR 2.9 today - need to reduce dose  7.5 mg every Thursday, Friday, Saturday, Monday, Tuesday  5 mg every Wednesday, Sunday   Recheck INR as scheduled   Medications:  See list!   Let me know if any questions/concerns!

## 2019-12-27 LAB — CBC
HCT: 26.3 % — ABNORMAL LOW (ref 35.0–45.0)
Hemoglobin: 7.4 g/dL — ABNORMAL LOW (ref 11.7–15.5)
MCH: 23.6 pg — ABNORMAL LOW (ref 27.0–33.0)
MCHC: 28.1 g/dL — ABNORMAL LOW (ref 32.0–36.0)
MCV: 83.8 fL (ref 80.0–100.0)
MPV: 10.1 fL (ref 7.5–12.5)
Platelets: 198 10*3/uL (ref 140–400)
RBC: 3.14 10*6/uL — ABNORMAL LOW (ref 3.80–5.10)
RDW: 16.5 % — ABNORMAL HIGH (ref 11.0–15.0)
WBC: 5.9 10*3/uL (ref 3.8–10.8)

## 2019-12-27 LAB — IRON,TIBC AND FERRITIN PANEL
%SAT: 3 % (calc) — ABNORMAL LOW (ref 16–45)
Ferritin: 3 ng/mL — ABNORMAL LOW (ref 16–288)
Iron: 11 ug/dL — ABNORMAL LOW (ref 45–160)
TIBC: 349 mcg/dL (calc) (ref 250–450)

## 2019-12-27 LAB — RETICULOCYTES
ABS Retic: 31400 cells/uL (ref 20000–8000)
Retic Ct Pct: 1 %

## 2019-12-28 ENCOUNTER — Other Ambulatory Visit: Payer: Self-pay | Admitting: Osteopathic Medicine

## 2019-12-28 DIAGNOSIS — D638 Anemia in other chronic diseases classified elsewhere: Secondary | ICD-10-CM | POA: Diagnosis not present

## 2019-12-29 DIAGNOSIS — D46C Myelodysplastic syndrome with isolated del(5q) chromosomal abnormality: Secondary | ICD-10-CM | POA: Diagnosis not present

## 2019-12-29 DIAGNOSIS — D500 Iron deficiency anemia secondary to blood loss (chronic): Secondary | ICD-10-CM | POA: Diagnosis not present

## 2020-01-01 DIAGNOSIS — D890 Polyclonal hypergammaglobulinemia: Secondary | ICD-10-CM | POA: Diagnosis not present

## 2020-01-01 DIAGNOSIS — Z903 Acquired absence of stomach [part of]: Secondary | ICD-10-CM | POA: Diagnosis not present

## 2020-01-01 DIAGNOSIS — J22 Unspecified acute lower respiratory infection: Secondary | ICD-10-CM | POA: Diagnosis not present

## 2020-01-01 DIAGNOSIS — J470 Bronchiectasis with acute lower respiratory infection: Secondary | ICD-10-CM | POA: Diagnosis not present

## 2020-01-01 DIAGNOSIS — D46C Myelodysplastic syndrome with isolated del(5q) chromosomal abnormality: Secondary | ICD-10-CM | POA: Diagnosis not present

## 2020-01-01 DIAGNOSIS — I480 Paroxysmal atrial fibrillation: Secondary | ICD-10-CM | POA: Diagnosis not present

## 2020-01-01 DIAGNOSIS — D472 Monoclonal gammopathy: Secondary | ICD-10-CM | POA: Diagnosis not present

## 2020-01-01 DIAGNOSIS — D500 Iron deficiency anemia secondary to blood loss (chronic): Secondary | ICD-10-CM | POA: Diagnosis not present

## 2020-01-04 ENCOUNTER — Other Ambulatory Visit: Payer: Self-pay

## 2020-01-04 ENCOUNTER — Other Ambulatory Visit: Payer: Self-pay | Admitting: Osteopathic Medicine

## 2020-01-04 ENCOUNTER — Ambulatory Visit (INDEPENDENT_AMBULATORY_CARE_PROVIDER_SITE_OTHER): Payer: Medicare HMO | Admitting: Medical-Surgical

## 2020-01-04 DIAGNOSIS — I4891 Unspecified atrial fibrillation: Secondary | ICD-10-CM

## 2020-01-04 DIAGNOSIS — Z7901 Long term (current) use of anticoagulants: Secondary | ICD-10-CM | POA: Diagnosis not present

## 2020-01-04 DIAGNOSIS — D6859 Other primary thrombophilia: Secondary | ICD-10-CM

## 2020-01-04 DIAGNOSIS — Z1211 Encounter for screening for malignant neoplasm of colon: Secondary | ICD-10-CM

## 2020-01-04 DIAGNOSIS — D649 Anemia, unspecified: Secondary | ICD-10-CM

## 2020-01-04 LAB — POCT INR
INR: 3.6 — AB (ref 2.0–3.0)
INR: 3.6 — AB (ref 2.0–3.0)

## 2020-01-04 LAB — POC HEMOCCULT BLD/STL (HOME/3-CARD/SCREEN): Card #3 Fecal Occult Blood, POC: POSITIVE

## 2020-01-04 NOTE — Addendum Note (Signed)
Addended by: Narda Rutherford on: 01/04/2020 02:59 PM   Modules accepted: Orders

## 2020-01-04 NOTE — Progress Notes (Signed)
Advised patient to take 5mg  x 3 days a week and 7.5mg  the rest of the days. Re-check in 10 days.

## 2020-01-07 NOTE — Addendum Note (Signed)
Addended by: Maryla Morrow on: 01/07/2020 02:59 PM   Modules accepted: Orders

## 2020-01-09 DIAGNOSIS — D500 Iron deficiency anemia secondary to blood loss (chronic): Secondary | ICD-10-CM | POA: Diagnosis not present

## 2020-01-09 DIAGNOSIS — K219 Gastro-esophageal reflux disease without esophagitis: Secondary | ICD-10-CM | POA: Diagnosis not present

## 2020-01-09 DIAGNOSIS — I10 Essential (primary) hypertension: Secondary | ICD-10-CM | POA: Diagnosis not present

## 2020-01-11 DIAGNOSIS — D890 Polyclonal hypergammaglobulinemia: Secondary | ICD-10-CM | POA: Diagnosis not present

## 2020-01-11 DIAGNOSIS — D46C Myelodysplastic syndrome with isolated del(5q) chromosomal abnormality: Secondary | ICD-10-CM | POA: Diagnosis not present

## 2020-01-11 DIAGNOSIS — J22 Unspecified acute lower respiratory infection: Secondary | ICD-10-CM | POA: Diagnosis not present

## 2020-01-11 DIAGNOSIS — D500 Iron deficiency anemia secondary to blood loss (chronic): Secondary | ICD-10-CM | POA: Diagnosis not present

## 2020-01-11 DIAGNOSIS — J470 Bronchiectasis with acute lower respiratory infection: Secondary | ICD-10-CM | POA: Diagnosis not present

## 2020-01-11 DIAGNOSIS — Z903 Acquired absence of stomach [part of]: Secondary | ICD-10-CM | POA: Diagnosis not present

## 2020-01-11 DIAGNOSIS — I480 Paroxysmal atrial fibrillation: Secondary | ICD-10-CM | POA: Diagnosis not present

## 2020-01-14 ENCOUNTER — Ambulatory Visit (INDEPENDENT_AMBULATORY_CARE_PROVIDER_SITE_OTHER): Payer: Medicare HMO | Admitting: Osteopathic Medicine

## 2020-01-14 DIAGNOSIS — Z7901 Long term (current) use of anticoagulants: Secondary | ICD-10-CM | POA: Diagnosis not present

## 2020-01-14 DIAGNOSIS — D6859 Other primary thrombophilia: Secondary | ICD-10-CM

## 2020-01-14 DIAGNOSIS — I4891 Unspecified atrial fibrillation: Secondary | ICD-10-CM | POA: Diagnosis not present

## 2020-01-14 LAB — POCT INR: INR: 2.5 (ref 2.0–3.0)

## 2020-01-14 MED ORDER — WARFARIN SODIUM 5 MG PO TABS: 5.0000 mg | ORAL_TABLET | Freq: Every evening | ORAL | 1 refills | Status: DC

## 2020-01-14 NOTE — Progress Notes (Signed)
Taking 2.5, 5, 7.5 then repeating this cycle... Will take the average and 5 mg daily Recheck 1 week

## 2020-01-14 NOTE — Patient Instructions (Signed)
Discussed results with provider. Patient advised to start taking 5 mg QD and follow up in one week.

## 2020-01-18 DIAGNOSIS — H903 Sensorineural hearing loss, bilateral: Secondary | ICD-10-CM | POA: Diagnosis not present

## 2020-01-18 DIAGNOSIS — D500 Iron deficiency anemia secondary to blood loss (chronic): Secondary | ICD-10-CM | POA: Diagnosis not present

## 2020-01-19 ENCOUNTER — Other Ambulatory Visit: Payer: Self-pay | Admitting: Osteopathic Medicine

## 2020-01-19 DIAGNOSIS — E559 Vitamin D deficiency, unspecified: Secondary | ICD-10-CM

## 2020-01-22 DIAGNOSIS — I480 Paroxysmal atrial fibrillation: Secondary | ICD-10-CM | POA: Diagnosis not present

## 2020-01-22 DIAGNOSIS — Z86711 Personal history of pulmonary embolism: Secondary | ICD-10-CM | POA: Diagnosis not present

## 2020-01-22 DIAGNOSIS — I10 Essential (primary) hypertension: Secondary | ICD-10-CM | POA: Diagnosis not present

## 2020-01-23 ENCOUNTER — Other Ambulatory Visit: Payer: Self-pay | Admitting: Osteopathic Medicine

## 2020-01-23 DIAGNOSIS — G8928 Other chronic postprocedural pain: Secondary | ICD-10-CM

## 2020-01-23 DIAGNOSIS — M5136 Other intervertebral disc degeneration, lumbar region: Secondary | ICD-10-CM

## 2020-01-23 DIAGNOSIS — M48061 Spinal stenosis, lumbar region without neurogenic claudication: Secondary | ICD-10-CM

## 2020-01-23 NOTE — Telephone Encounter (Signed)
[place] is requesting med refill for hydrocodone-ace.

## 2020-01-24 ENCOUNTER — Other Ambulatory Visit: Payer: Self-pay | Admitting: Osteopathic Medicine

## 2020-01-24 ENCOUNTER — Other Ambulatory Visit: Payer: Self-pay | Admitting: Sports Medicine

## 2020-01-24 ENCOUNTER — Ambulatory Visit (INDEPENDENT_AMBULATORY_CARE_PROVIDER_SITE_OTHER): Payer: Medicare HMO | Admitting: Osteopathic Medicine

## 2020-01-24 DIAGNOSIS — D6859 Other primary thrombophilia: Secondary | ICD-10-CM

## 2020-01-24 DIAGNOSIS — Z7901 Long term (current) use of anticoagulants: Secondary | ICD-10-CM | POA: Diagnosis not present

## 2020-01-24 DIAGNOSIS — M48061 Spinal stenosis, lumbar region without neurogenic claudication: Secondary | ICD-10-CM

## 2020-01-24 DIAGNOSIS — I4891 Unspecified atrial fibrillation: Secondary | ICD-10-CM

## 2020-01-24 LAB — POCT INR: INR: 1.4 — AB (ref 2.0–3.0)

## 2020-01-24 MED ORDER — HYDROCODONE-ACETAMINOPHEN 10-325 MG PO TABS: 1.0000 | ORAL_TABLET | Freq: Two times a day (BID) | ORAL | 0 refills | Status: DC | PRN

## 2020-01-24 MED ORDER — WARFARIN SODIUM 5 MG PO TABS: 5.0000 mg | ORAL_TABLET | Freq: Every evening | ORAL | 1 refills | Status: DC

## 2020-01-24 NOTE — Patient Instructions (Signed)
Results reviewed with provider. Patient advised to take 7.5 mg on Thursday and Monday and 5 mg all other days. Patient is to return in 1 week for re-check.

## 2020-01-31 ENCOUNTER — Ambulatory Visit: Payer: Medicare HMO

## 2020-02-01 ENCOUNTER — Ambulatory Visit (INDEPENDENT_AMBULATORY_CARE_PROVIDER_SITE_OTHER): Payer: Medicare HMO | Admitting: Osteopathic Medicine

## 2020-02-01 VITALS — BP 142/60 | HR 77 | Temp 98.2°F | Wt 116.1 lb

## 2020-02-01 DIAGNOSIS — I480 Paroxysmal atrial fibrillation: Secondary | ICD-10-CM | POA: Diagnosis not present

## 2020-02-01 DIAGNOSIS — D6859 Other primary thrombophilia: Secondary | ICD-10-CM

## 2020-02-01 DIAGNOSIS — I10 Essential (primary) hypertension: Secondary | ICD-10-CM | POA: Diagnosis not present

## 2020-02-01 DIAGNOSIS — Z86711 Personal history of pulmonary embolism: Secondary | ICD-10-CM | POA: Diagnosis not present

## 2020-02-01 DIAGNOSIS — Z7901 Long term (current) use of anticoagulants: Secondary | ICD-10-CM

## 2020-02-01 DIAGNOSIS — I4891 Unspecified atrial fibrillation: Secondary | ICD-10-CM

## 2020-02-01 MED ORDER — WARFARIN SODIUM 5 MG PO TABS: 5.0000 mg | ORAL_TABLET | Freq: Every evening | ORAL | 1 refills | Status: DC

## 2020-02-01 NOTE — Progress Notes (Signed)
Requesting home health referral

## 2020-02-01 NOTE — Patient Instructions (Signed)
Vitamin K Foods and Warfarin Warfarin is a blood thinner (anticoagulant). Anticoagulant medicines help prevent the formation of blood clots. These medicines work by decreasing the activity of vitamin K, which promotes normal blood clotting. When you take warfarin, problems can occur from suddenly increasing or decreasing the amount of vitamin K that you eat from one day to the next. Problems may include:  Blood clots.  Bleeding. What general guidelines do I need to follow? To avoid problems when taking warfarin:  Eat a balanced diet that includes: ? Fresh fruits and vegetables. ? Whole grains. ? Low-fat dairy products. ? Lean proteins, such as fish, eggs, and lean cuts of meat.  Keep your intake of vitamin K consistent from day to day. To do this: ? Avoid eating large amounts of vitamin K one day and low amounts of vitamin K the next day. ? If you take a multivitamin that contains vitamin K, be sure to take it every day. ? Know which foods contain vitamin K. Use the lists below to understand serving sizes and the amount of vitamin K in one serving.  Avoid major changes in your diet. If you are going to change your diet, talk with your health care provider before making changes.  Work with a nutrition specialist (dietitian) to develop a meal plan that works best for you.  High vitamin K foods Foods that are high in vitamin K contain more than 100 mcg (micrograms) per serving. These include:  Broccoli (cooked) -  cup has 110 mcg.  Brussels sprouts (cooked) -  cup has 109 mcg.  Greens, beet (cooked) -  cup has 350 mcg.  Greens, collard (cooked) -  cup has 418 mcg.  Greens, turnip (cooked) -  cup has 265 mcg.  Green onions or scallions -  cup has 105 mcg.  Kale (fresh or frozen) -  cup has 531 mcg.  Parsley (raw) - 10 sprigs has 164 mcg.  Spinach (cooked) -  cup has 444 mcg.  Swiss chard (cooked) -  cup has 287 mcg. Moderate vitamin K foods Foods that have a  moderate amount of vitamin K contain 25-100 mcg per serving. These include:  Asparagus (cooked) - 5 spears have 38 mcg.  Black-eyed peas (dried) -  cup has 32 mcg.  Cabbage (cooked) -  cup has 37 mcg.  Kiwi fruit - 1 medium has 31 mcg.  Lettuce - 1 cup has 57-63 mcg.  Okra (frozen) -  cup has 44 mcg.  Prunes (dried) - 5 prunes have 25 mcg.  Watercress (raw) - 1 cup has 85 mcg. Low vitamin K foods Foods low in vitamin K contain less than 25 mcg per serving. These include:  Artichoke - 1 medium has 18 mcg.  Avocado - 1 oz. has 6 mcg.  Blueberries -  cup has 14 mcg.  Cabbage (raw) -  cup has 21 mcg.  Carrots (cooked) -  cup has 11 mcg.  Cauliflower (raw) -  cup has 11 mcg.  Cucumber with peel (raw) -  cup has 9 mcg.  Grapes -  cup has 12 mcg.  Mango - 1 medium has 9 mcg.  Nuts - 1 oz. has 15 mcg.  Pear - 1 medium has 8 mcg.  Peas (cooked) -  cup has 19 mcg.  Pickles - 1 spear has 14 mcg.  Pumpkin seeds - 1 oz. has 13 mcg.  Sauerkraut (canned) -  cup has 16 mcg.  Soybeans (cooked) -  cup has 16 mcg.    Tomato (raw) - 1 medium has 10 mcg.  Tomato sauce -  cup has 17 mcg. Vitamin K-free foods If a food contain less than 5 mcg per serving, it is considered to have no vitamin K. These foods include:  Bread and cereal products.  Cheese.  Eggs.  Fish and shellfish.  Meat and poultry.  Milk and dairy products.  Sunflower seeds. Actual amounts of vitamin K in foods may be different depending on processing. Talk with your dietitian about what foods you can eat and what foods you should avoid. This information is not intended to replace advice given to you by your health care provider. Make sure you discuss any questions you have with your health care provider. Document Revised: 07/29/2017 Document Reviewed: 11/19/2015 Elsevier Patient Education  2020 Elsevier Inc.  

## 2020-02-07 DIAGNOSIS — H3530 Unspecified macular degeneration: Secondary | ICD-10-CM | POA: Diagnosis not present

## 2020-02-07 DIAGNOSIS — R32 Unspecified urinary incontinence: Secondary | ICD-10-CM | POA: Diagnosis not present

## 2020-02-07 DIAGNOSIS — I129 Hypertensive chronic kidney disease with stage 1 through stage 4 chronic kidney disease, or unspecified chronic kidney disease: Secondary | ICD-10-CM | POA: Diagnosis not present

## 2020-02-07 DIAGNOSIS — M1990 Unspecified osteoarthritis, unspecified site: Secondary | ICD-10-CM | POA: Diagnosis not present

## 2020-02-07 DIAGNOSIS — G8929 Other chronic pain: Secondary | ICD-10-CM | POA: Diagnosis not present

## 2020-02-07 DIAGNOSIS — I4891 Unspecified atrial fibrillation: Secondary | ICD-10-CM | POA: Diagnosis not present

## 2020-02-07 DIAGNOSIS — N189 Chronic kidney disease, unspecified: Secondary | ICD-10-CM | POA: Diagnosis not present

## 2020-02-07 DIAGNOSIS — Z7901 Long term (current) use of anticoagulants: Secondary | ICD-10-CM | POA: Diagnosis not present

## 2020-02-08 ENCOUNTER — Ambulatory Visit (INDEPENDENT_AMBULATORY_CARE_PROVIDER_SITE_OTHER): Payer: Medicare HMO | Admitting: Sports Medicine

## 2020-02-08 ENCOUNTER — Encounter: Payer: Self-pay | Admitting: Sports Medicine

## 2020-02-08 VITALS — BP 132/73 | HR 62 | Temp 98.3°F

## 2020-02-08 DIAGNOSIS — E538 Deficiency of other specified B group vitamins: Secondary | ICD-10-CM

## 2020-02-08 DIAGNOSIS — R109 Unspecified abdominal pain: Secondary | ICD-10-CM

## 2020-02-08 DIAGNOSIS — R531 Weakness: Secondary | ICD-10-CM | POA: Diagnosis not present

## 2020-02-08 DIAGNOSIS — N183 Chronic kidney disease, stage 3 unspecified: Secondary | ICD-10-CM | POA: Diagnosis not present

## 2020-02-08 DIAGNOSIS — Z7901 Long term (current) use of anticoagulants: Secondary | ICD-10-CM

## 2020-02-08 DIAGNOSIS — I4891 Unspecified atrial fibrillation: Secondary | ICD-10-CM | POA: Diagnosis not present

## 2020-02-08 DIAGNOSIS — D649 Anemia, unspecified: Secondary | ICD-10-CM | POA: Diagnosis not present

## 2020-02-08 DIAGNOSIS — E559 Vitamin D deficiency, unspecified: Secondary | ICD-10-CM

## 2020-02-08 DIAGNOSIS — D6859 Other primary thrombophilia: Secondary | ICD-10-CM | POA: Diagnosis not present

## 2020-02-08 LAB — POCT HEMOGLOBIN: Hemoglobin: 10.4 g/dL — AB (ref 11–14.6)

## 2020-02-08 LAB — POCT INR: INR: 2.4 (ref 2.0–3.0)

## 2020-02-08 NOTE — Assessment & Plan Note (Addendum)
Oneka has a history of paroxysmal atrial fibrillation on Coumadin. History of pulmonary embolus on long term Coumadin. Patient reports intermittent palpitations.  Palpable rhythm is regular, and blood pressure is controlled. Recent lower extremity venous doppler revealed no evidence of deep vein thrombosis. Occult GI bleeding on Coumadin anticoagulation. Recent blood transfusion required along with IV iron therapy.  Hemoglobin today is 10.3.  Echocardiogram in April 2021 revealed normal left ventricular systolic function, LV ejection fraction 60 to 65%. No evidence of significant right heart failure or pulmonary hypertension reported on that study.  It sounds like cardiology is evaluating A. fib burden for discussion of right atrial appendage closure, this would allow [place] to discontinue Coumadin, because her hemoglobin is stable we are going to leave this alone for now. I am going to check some labs including full CBC, CMP, TSH, B12. I do not think she is depressed. Return to see me next week.  Update: See above for information on her CMP, in addition her B12 levels were at the lower limit of normal, combined with her abnormal RBC morphology including macrocytosis I am going to add methylmalonic acid and homocystine levels to help me determine if there is a true functional B12 deficiency here.  In addition she has hypercalcemia, I do suspect this is related to her acidosis and chronic renal insufficiency, we are going to add magnesium and phosphate levels to the blood in the lab as well to ensure we are not missing a deficiency of these minerals.

## 2020-02-08 NOTE — Assessment & Plan Note (Addendum)
Adding some labs, including amylase, lipase, she is guarding in the epigastrium. I would like a CT abdomen and pelvis with oral and IV contrast, it sounds like they will have to do this on Monday, risks, benefits, alternatives explained. She has been on clindamycin as well, adding stool C. difficile tests.  Abdominal and pelvic CT without acute pathology, she did have some midepigastric pain so we are going to add esomeprazole twice a day.

## 2020-02-08 NOTE — Progress Notes (Addendum)
    Procedures performed today:    None.  Independent interpretation of notes and tests performed by another provider:   None.  Brief History, Exam, Impression, and Recommendations:    Weakness Luana has a history of paroxysmal atrial fibrillation on Coumadin. History of pulmonary embolus on long term Coumadin. Patient reports intermittent palpitations.  Palpable rhythm is regular, and blood pressure is controlled. Recent lower extremity venous doppler revealed no evidence of deep vein thrombosis. Occult GI bleeding on Coumadin anticoagulation. Recent blood transfusion required along with IV iron therapy.  Hemoglobin today is 10.3.  Echocardiogram in April 2021 revealed normal left ventricular systolic function, LV ejection fraction 60 to 65%. No evidence of significant right heart failure or pulmonary hypertension reported on that study.  It sounds like cardiology is evaluating A. fib burden for discussion of right atrial appendage closure, this would allow [place] to discontinue Coumadin, because her hemoglobin is stable we are going to leave this alone for now. I am going to check some labs including full CBC, CMP, TSH, B12. I do not think she is depressed. Return to see me next week.  Update: See above for information on her CMP, in addition her B12 levels were at the lower limit of normal, combined with her abnormal RBC morphology including macrocytosis I am going to add methylmalonic acid and homocystine levels to help me determine if there is a true functional B12 deficiency here.  In addition she has hypercalcemia, I do suspect this is related to her acidosis and chronic renal insufficiency, we are going to add magnesium and phosphate levels to the blood in the lab as well to ensure we are not missing a deficiency of these minerals.  Acute abdominal pain Adding some labs, including amylase, lipase, she is guarding in the epigastrium. I would like a CT abdomen and pelvis with oral and IV  contrast, it sounds like they will have to do this on Monday, risks, benefits, alternatives explained. She has been on clindamycin as well, adding stool C. difficile tests.  Abdominal and pelvic CT without acute pathology, she did have some midepigastric pain so we are going to add esomeprazole twice a day.  CKD (chronic kidney disease) stage 3, GFR 30-59 ml/min Od[place] does have chronic renal insufficiency, her renal function is fairly stable, she does have a nonanion gap metabolic acidosis, hyperchloremia, as well as hypocalcemia, this is likely a pseudohypocalcemia related to her acidosis.  I think this is most likely due to both her renal insufficiency and her diarrheal losses. We are still awaiting her abdominal and pelvic CT due to her severe abdominal pain, should her CT scan appear normal I think it would be reasonable to give her an antidiarrheal to slow GI transit time.  In addition if no better we could certainly give her some lactated Ringers in the office.  I spent 40 minutes of total time managing this patient today, this includes chart review, face to face, and non-face to face time.   ___________________________________________ Gwen Her. Dianah Field, M.D., ABFM., CAQSM. Primary Care and [place] Instructor of [place] of [facility] of Medicine

## 2020-02-09 NOTE — Addendum Note (Signed)
Addended by: Silverio Decamp on: 02/09/2020 02:03 PM   Modules accepted: Orders

## 2020-02-09 NOTE — Assessment & Plan Note (Signed)
Gloria Mckay does have chronic renal insufficiency, her renal function is fairly stable, she does have a nonanion gap metabolic acidosis, hyperchloremia, as well as hypocalcemia, this is likely a pseudohypocalcemia related to her acidosis.  I think this is most likely due to both her renal insufficiency and her diarrheal losses. We are still awaiting her abdominal and pelvic CT due to her severe abdominal pain, should her CT scan appear normal I think it would be reasonable to give her an antidiarrheal to slow GI transit time.  In addition if no better we could certainly give her some lactated Ringers in the office.

## 2020-02-11 ENCOUNTER — Ambulatory Visit (INDEPENDENT_AMBULATORY_CARE_PROVIDER_SITE_OTHER): Payer: Medicare HMO

## 2020-02-11 DIAGNOSIS — R109 Unspecified abdominal pain: Secondary | ICD-10-CM

## 2020-02-11 DIAGNOSIS — N289 Disorder of kidney and ureter, unspecified: Secondary | ICD-10-CM | POA: Diagnosis not present

## 2020-02-12 DIAGNOSIS — Z7901 Long term (current) use of anticoagulants: Secondary | ICD-10-CM | POA: Diagnosis not present

## 2020-02-12 DIAGNOSIS — N189 Chronic kidney disease, unspecified: Secondary | ICD-10-CM | POA: Diagnosis not present

## 2020-02-12 DIAGNOSIS — G8929 Other chronic pain: Secondary | ICD-10-CM | POA: Diagnosis not present

## 2020-02-12 DIAGNOSIS — M1990 Unspecified osteoarthritis, unspecified site: Secondary | ICD-10-CM | POA: Diagnosis not present

## 2020-02-12 DIAGNOSIS — H3530 Unspecified macular degeneration: Secondary | ICD-10-CM | POA: Diagnosis not present

## 2020-02-12 DIAGNOSIS — I4891 Unspecified atrial fibrillation: Secondary | ICD-10-CM | POA: Diagnosis not present

## 2020-02-12 DIAGNOSIS — R32 Unspecified urinary incontinence: Secondary | ICD-10-CM | POA: Diagnosis not present

## 2020-02-12 DIAGNOSIS — I129 Hypertensive chronic kidney disease with stage 1 through stage 4 chronic kidney disease, or unspecified chronic kidney disease: Secondary | ICD-10-CM | POA: Diagnosis not present

## 2020-02-12 MED ORDER — ESOMEPRAZOLE MAGNESIUM 40 MG PO CPDR: 40.0000 mg | DELAYED_RELEASE_CAPSULE | Freq: Two times a day (BID) | ORAL | 3 refills | Status: DC

## 2020-02-12 NOTE — Addendum Note (Signed)
Addended by: Silverio Decamp on: 02/12/2020 04:27 PM   Modules accepted: Orders

## 2020-02-13 DIAGNOSIS — R531 Weakness: Secondary | ICD-10-CM | POA: Diagnosis not present

## 2020-02-13 DIAGNOSIS — E538 Deficiency of other specified B group vitamins: Secondary | ICD-10-CM | POA: Diagnosis not present

## 2020-02-13 DIAGNOSIS — R109 Unspecified abdominal pain: Secondary | ICD-10-CM | POA: Diagnosis not present

## 2020-02-13 DIAGNOSIS — E559 Vitamin D deficiency, unspecified: Secondary | ICD-10-CM | POA: Diagnosis not present

## 2020-02-14 ENCOUNTER — Other Ambulatory Visit: Payer: Self-pay | Admitting: *Deleted

## 2020-02-14 DIAGNOSIS — D6859 Other primary thrombophilia: Secondary | ICD-10-CM

## 2020-02-14 DIAGNOSIS — Z7901 Long term (current) use of anticoagulants: Secondary | ICD-10-CM

## 2020-02-14 LAB — CLOSTRIDIUM DIFFICILE TOXIN B, QUALITATIVE, REAL-TIME PCR: Toxigenic C. Difficile by PCR: NOT DETECTED

## 2020-02-14 LAB — C. DIFFICILE GDH AND TOXIN A/B
GDH ANTIGEN: DETECTED
MICRO NUMBER:: 10601725
SPECIMEN QUALITY:: ADEQUATE
TOXIN A AND B: NOT DETECTED

## 2020-02-15 ENCOUNTER — Ambulatory Visit (INDEPENDENT_AMBULATORY_CARE_PROVIDER_SITE_OTHER): Payer: Medicare HMO | Admitting: Sports Medicine

## 2020-02-15 ENCOUNTER — Other Ambulatory Visit: Payer: Self-pay

## 2020-02-15 ENCOUNTER — Encounter: Payer: Self-pay | Admitting: Sports Medicine

## 2020-02-15 DIAGNOSIS — M1990 Unspecified osteoarthritis, unspecified site: Secondary | ICD-10-CM | POA: Diagnosis not present

## 2020-02-15 DIAGNOSIS — R32 Unspecified urinary incontinence: Secondary | ICD-10-CM | POA: Diagnosis not present

## 2020-02-15 DIAGNOSIS — G8929 Other chronic pain: Secondary | ICD-10-CM | POA: Diagnosis not present

## 2020-02-15 DIAGNOSIS — N189 Chronic kidney disease, unspecified: Secondary | ICD-10-CM | POA: Diagnosis not present

## 2020-02-15 DIAGNOSIS — H3530 Unspecified macular degeneration: Secondary | ICD-10-CM | POA: Diagnosis not present

## 2020-02-15 DIAGNOSIS — E538 Deficiency of other specified B group vitamins: Secondary | ICD-10-CM | POA: Diagnosis not present

## 2020-02-15 DIAGNOSIS — I129 Hypertensive chronic kidney disease with stage 1 through stage 4 chronic kidney disease, or unspecified chronic kidney disease: Secondary | ICD-10-CM | POA: Diagnosis not present

## 2020-02-15 DIAGNOSIS — Z96652 Presence of left artificial knee joint: Secondary | ICD-10-CM

## 2020-02-15 DIAGNOSIS — Z7901 Long term (current) use of anticoagulants: Secondary | ICD-10-CM | POA: Diagnosis not present

## 2020-02-15 DIAGNOSIS — R109 Unspecified abdominal pain: Secondary | ICD-10-CM | POA: Diagnosis not present

## 2020-02-15 DIAGNOSIS — I4891 Unspecified atrial fibrillation: Secondary | ICD-10-CM | POA: Diagnosis not present

## 2020-02-15 LAB — CBC WITH DIFFERENTIAL/PLATELET
Absolute Monocytes: 708 cells/uL (ref 200–950)
Basophils Absolute: 39 cells/uL (ref 0–200)
Basophils Relative: 0.5 %
Eosinophils Absolute: 501 cells/uL — ABNORMAL HIGH (ref 15–500)
Eosinophils Relative: 6.5 %
HCT: 32.7 % — ABNORMAL LOW (ref 35.0–45.0)
Hemoglobin: 10.1 g/dL — ABNORMAL LOW (ref 11.7–15.5)
Lymphs Abs: 1455 cells/uL (ref 850–3900)
MCH: 27.9 pg (ref 27.0–33.0)
MCHC: 30.9 g/dL — ABNORMAL LOW (ref 32.0–36.0)
MCV: 90.3 fL (ref 80.0–100.0)
MPV: 9.5 fL (ref 7.5–12.5)
Monocytes Relative: 9.2 %
Neutro Abs: 4997 cells/uL (ref 1500–7800)
Neutrophils Relative %: 64.9 %
Platelets: 199 10*3/uL (ref 140–400)
RBC: 3.62 10*6/uL — ABNORMAL LOW (ref 3.80–5.10)
RDW: 22.4 % — ABNORMAL HIGH (ref 11.0–15.0)
Total Lymphocyte: 18.9 %
WBC: 7.7 10*3/uL (ref 3.8–10.8)

## 2020-02-15 LAB — COMPLETE METABOLIC PANEL WITH GFR
AG Ratio: 2 (calc) (ref 1.0–2.5)
ALT: 12 U/L (ref 6–29)
AST: 12 U/L (ref 10–35)
Albumin: 3.9 g/dL (ref 3.6–5.1)
Alkaline phosphatase (APISO): 97 U/L (ref 37–153)
BUN/Creatinine Ratio: 18 (calc) (ref 6–22)
BUN: 32 mg/dL — ABNORMAL HIGH (ref 7–25)
CO2: 14 mmol/L — ABNORMAL LOW (ref 20–32)
Calcium: 7.7 mg/dL — ABNORMAL LOW (ref 8.6–10.4)
Chloride: 122 mmol/L — ABNORMAL HIGH (ref 98–110)
Creat: 1.75 mg/dL — ABNORMAL HIGH (ref 0.60–0.93)
GFR, Est African American: 32 mL/min/{1.73_m2} — ABNORMAL LOW (ref >=60)
GFR, Est Non African American: 28 mL/min/{1.73_m2} — ABNORMAL LOW (ref >=60)
Globulin: 2 g/dL (calc) (ref 1.9–3.7)
Glucose, Bld: 81 mg/dL (ref 65–139)
Potassium: 4.1 mmol/L (ref 3.5–5.3)
Sodium: 144 mmol/L (ref 135–146)
Total Bilirubin: 0.2 mg/dL (ref 0.2–1.2)
Total Protein: 5.9 g/dL — ABNORMAL LOW (ref 6.1–8.1)

## 2020-02-15 LAB — TSH: TSH: 1.65 mIU/L (ref 0.40–4.50)

## 2020-02-15 LAB — AMYLASE: Amylase: 82 U/L (ref 21–101)

## 2020-02-15 LAB — VITAMIN B12: Vitamin B-12: 312 pg/mL (ref 200–1100)

## 2020-02-15 LAB — PHOSPHORUS: Phosphorus: 2.4 mg/dL (ref 2.1–4.3)

## 2020-02-15 LAB — METHYLMALONIC ACID AND HOMOCYSTEINE
Homocysteine: 27 umol/L — ABNORMAL HIGH (ref <10.4)
Methylmalonic Acid, Quant: 428 nmol/L — ABNORMAL HIGH (ref 87–318)

## 2020-02-15 LAB — TEST AUTHORIZATION

## 2020-02-15 LAB — MAGNESIUM: Magnesium: 1.9 mg/dL (ref 1.5–2.5)

## 2020-02-15 LAB — CBC MORPHOLOGY

## 2020-02-15 LAB — LIPASE: Lipase: 57 U/L (ref 7–60)

## 2020-02-15 LAB — VITAMIN D 25 HYDROXY (VIT D DEFICIENCY, FRACTURES): Vit D, 25-Hydroxy: 33 ng/mL (ref 30–100)

## 2020-02-15 MED ORDER — CYANOCOBALAMIN 1000 MCG/ML IJ SOLN
1000.0000 ug | Freq: Once | INTRAMUSCULAR | Status: AC
  Administered 2020-02-15: 1000 ug via INTRAMUSCULAR

## 2020-02-15 MED ORDER — CYANOCOBALAMIN 1000 MCG/ML IJ SOLN: 1000.0000 ug | INTRAMUSCULAR | 11 refills | Status: DC

## 2020-02-15 NOTE — Assessment & Plan Note (Signed)
Gloria Mckay was having midepigastric abdominal pain, this has improved to some degree with Nexium, she has discontinued her Nexium. We did obtain a CT of the abdomen and pelvis with oral and IV contrast that showed nothing acute. Stool C. difficile testing was negative, labs were unrevealing with regards to her belly pain.

## 2020-02-15 NOTE — Assessment & Plan Note (Signed)
Gloria Mckay has also been on clindamycin for over a year now, she has had some chronic diarrhea. C. difficile testing was negative at the last visit, she self discontinued her antibiotic and diarrhea has improved considerably. We will simply need to keep an eye on her knee, if she develops increasing swelling we could certainly tap it and do a Synovasure test to ensure no infection.

## 2020-02-15 NOTE — Progress Notes (Signed)
    Procedures performed today:    None.  Independent interpretation of notes and tests performed by another provider:   None.  Brief History, Exam, Impression, and Recommendations:    Acute abdominal pain Makiyah was having midepigastric abdominal pain, this has improved to some degree with Nexium, she has discontinued her Nexium. We did obtain a CT of the abdomen and pelvis with oral and IV contrast that showed nothing acute. Stool C. difficile testing was negative, labs were unrevealing with regards to her belly pain.   History of arthroplasty of left knee Birtha has also been on clindamycin for over a year now, she has had some chronic diarrhea. C. difficile testing was negative at the last visit, she self discontinued her antibiotic and diarrhea has improved considerably. We will simply need to keep an eye on her knee, if she develops increasing swelling we could certainly tap it and do a Synovasure test to ensure no infection.   B12 deficiency Weakness has improved considerably. See my prior note under this problem for full information. Of note we did check B12 levels which were borderline, her methylmalonic acid levels did come back elevated which is concerning for functional B12 deficiency with a low normal vitamin B12 level. Homocystine levels were also elevated, this is suspicious for folic acid deficiency, she will do a folic acid vitamin daily, and I would like her to do monthly B12 injections. We will give her the first B12 injection today, and I will send off a prescription for her to get home B12 injections.    ___________________________________________ Gwen Her. Dianah Field, M.D., ABFM., CAQSM. Primary Care and [place] Instructor of [place] of [facility] of Medicine

## 2020-02-15 NOTE — Assessment & Plan Note (Addendum)
Weakness has improved considerably. See my prior note under this problem for full information. Of note we did check B12 levels which were borderline, her methylmalonic acid levels did come back elevated which is concerning for functional B12 deficiency with a low normal vitamin B12 level. Homocystine levels were also elevated, this is suspicious for folic acid deficiency, she will do a folic acid vitamin daily, and I would like her to do monthly B12 injections. We will give her the first B12 injection today, and I will send off a prescription for her to get home B12 injections.

## 2020-02-18 ENCOUNTER — Telehealth: Payer: Self-pay

## 2020-02-18 DIAGNOSIS — M1990 Unspecified osteoarthritis, unspecified site: Secondary | ICD-10-CM | POA: Diagnosis not present

## 2020-02-18 DIAGNOSIS — Z7901 Long term (current) use of anticoagulants: Secondary | ICD-10-CM | POA: Diagnosis not present

## 2020-02-18 DIAGNOSIS — H3530 Unspecified macular degeneration: Secondary | ICD-10-CM | POA: Diagnosis not present

## 2020-02-18 DIAGNOSIS — R32 Unspecified urinary incontinence: Secondary | ICD-10-CM | POA: Diagnosis not present

## 2020-02-18 DIAGNOSIS — I4891 Unspecified atrial fibrillation: Secondary | ICD-10-CM | POA: Diagnosis not present

## 2020-02-18 DIAGNOSIS — I129 Hypertensive chronic kidney disease with stage 1 through stage 4 chronic kidney disease, or unspecified chronic kidney disease: Secondary | ICD-10-CM | POA: Diagnosis not present

## 2020-02-18 DIAGNOSIS — G8929 Other chronic pain: Secondary | ICD-10-CM | POA: Diagnosis not present

## 2020-02-18 DIAGNOSIS — N189 Chronic kidney disease, unspecified: Secondary | ICD-10-CM | POA: Diagnosis not present

## 2020-02-18 NOTE — Telephone Encounter (Signed)
Gloria Mckay with [facility] left a message stating the INR was 2.8.   Reports no bruising or bleeding Reports taking 7.5 mg Sunday, Tuesday, Thursday and Saturday. 5 mg the rest of the week.

## 2020-02-18 NOTE — Telephone Encounter (Signed)
Home health advised.  ?

## 2020-02-18 NOTE — Telephone Encounter (Signed)
Continue current dose Recheck INR 1 week

## 2020-02-19 DIAGNOSIS — R32 Unspecified urinary incontinence: Secondary | ICD-10-CM | POA: Diagnosis not present

## 2020-02-19 DIAGNOSIS — H3530 Unspecified macular degeneration: Secondary | ICD-10-CM | POA: Diagnosis not present

## 2020-02-19 DIAGNOSIS — M1990 Unspecified osteoarthritis, unspecified site: Secondary | ICD-10-CM | POA: Diagnosis not present

## 2020-02-19 DIAGNOSIS — Z7901 Long term (current) use of anticoagulants: Secondary | ICD-10-CM | POA: Diagnosis not present

## 2020-02-19 DIAGNOSIS — I4891 Unspecified atrial fibrillation: Secondary | ICD-10-CM | POA: Diagnosis not present

## 2020-02-19 DIAGNOSIS — I129 Hypertensive chronic kidney disease with stage 1 through stage 4 chronic kidney disease, or unspecified chronic kidney disease: Secondary | ICD-10-CM | POA: Diagnosis not present

## 2020-02-19 DIAGNOSIS — N189 Chronic kidney disease, unspecified: Secondary | ICD-10-CM | POA: Diagnosis not present

## 2020-02-19 DIAGNOSIS — G8929 Other chronic pain: Secondary | ICD-10-CM | POA: Diagnosis not present

## 2020-02-22 ENCOUNTER — Telehealth: Payer: Self-pay

## 2020-02-22 DIAGNOSIS — G8929 Other chronic pain: Secondary | ICD-10-CM | POA: Diagnosis not present

## 2020-02-22 DIAGNOSIS — Z7901 Long term (current) use of anticoagulants: Secondary | ICD-10-CM | POA: Diagnosis not present

## 2020-02-22 DIAGNOSIS — R32 Unspecified urinary incontinence: Secondary | ICD-10-CM | POA: Diagnosis not present

## 2020-02-22 DIAGNOSIS — J411 Mucopurulent chronic bronchitis: Secondary | ICD-10-CM | POA: Diagnosis not present

## 2020-02-22 DIAGNOSIS — M1990 Unspecified osteoarthritis, unspecified site: Secondary | ICD-10-CM | POA: Diagnosis not present

## 2020-02-22 DIAGNOSIS — R718 Other abnormality of red blood cells: Secondary | ICD-10-CM | POA: Diagnosis not present

## 2020-02-22 DIAGNOSIS — I129 Hypertensive chronic kidney disease with stage 1 through stage 4 chronic kidney disease, or unspecified chronic kidney disease: Secondary | ICD-10-CM | POA: Diagnosis not present

## 2020-02-22 DIAGNOSIS — N189 Chronic kidney disease, unspecified: Secondary | ICD-10-CM | POA: Diagnosis not present

## 2020-02-22 DIAGNOSIS — H3530 Unspecified macular degeneration: Secondary | ICD-10-CM | POA: Diagnosis not present

## 2020-02-22 DIAGNOSIS — I480 Paroxysmal atrial fibrillation: Secondary | ICD-10-CM | POA: Diagnosis not present

## 2020-02-22 DIAGNOSIS — M810 Age-related osteoporosis without current pathological fracture: Secondary | ICD-10-CM | POA: Diagnosis not present

## 2020-02-22 DIAGNOSIS — N184 Chronic kidney disease, stage 4 (severe): Secondary | ICD-10-CM | POA: Diagnosis not present

## 2020-02-22 DIAGNOSIS — F3341 Major depressive disorder, recurrent, in partial remission: Secondary | ICD-10-CM | POA: Diagnosis not present

## 2020-02-22 DIAGNOSIS — I4891 Unspecified atrial fibrillation: Secondary | ICD-10-CM | POA: Diagnosis not present

## 2020-02-22 DIAGNOSIS — E559 Vitamin D deficiency, unspecified: Secondary | ICD-10-CM | POA: Diagnosis not present

## 2020-02-22 DIAGNOSIS — M48061 Spinal stenosis, lumbar region without neurogenic claudication: Secondary | ICD-10-CM | POA: Diagnosis not present

## 2020-02-22 DIAGNOSIS — D46C Myelodysplastic syndrome with isolated del(5q) chromosomal abnormality: Secondary | ICD-10-CM | POA: Diagnosis not present

## 2020-02-22 DIAGNOSIS — D803 Selective deficiency of immunoglobulin G [IgG] subclasses: Secondary | ICD-10-CM | POA: Diagnosis not present

## 2020-02-22 DIAGNOSIS — D500 Iron deficiency anemia secondary to blood loss (chronic): Secondary | ICD-10-CM | POA: Diagnosis not present

## 2020-02-22 NOTE — Telephone Encounter (Signed)
Routing to covering provider.   Tabitha from [facility] left a message this afternoon stating that pt's INR was 2.7   Reports no bruising or bleeding Reports taking 7.5 mg Sunday, Tuesday, Thursday and Saturday. 5 mg the rest of the week.

## 2020-02-22 NOTE — Telephone Encounter (Signed)
Continue current dose regimen. Recheck INR in 1 week.

## 2020-02-25 DIAGNOSIS — I129 Hypertensive chronic kidney disease with stage 1 through stage 4 chronic kidney disease, or unspecified chronic kidney disease: Secondary | ICD-10-CM | POA: Diagnosis not present

## 2020-02-25 DIAGNOSIS — R32 Unspecified urinary incontinence: Secondary | ICD-10-CM | POA: Diagnosis not present

## 2020-02-25 DIAGNOSIS — H3530 Unspecified macular degeneration: Secondary | ICD-10-CM | POA: Diagnosis not present

## 2020-02-25 DIAGNOSIS — N189 Chronic kidney disease, unspecified: Secondary | ICD-10-CM | POA: Diagnosis not present

## 2020-02-25 DIAGNOSIS — M1990 Unspecified osteoarthritis, unspecified site: Secondary | ICD-10-CM | POA: Diagnosis not present

## 2020-02-25 DIAGNOSIS — Z7901 Long term (current) use of anticoagulants: Secondary | ICD-10-CM | POA: Diagnosis not present

## 2020-02-25 DIAGNOSIS — I4891 Unspecified atrial fibrillation: Secondary | ICD-10-CM | POA: Diagnosis not present

## 2020-02-25 DIAGNOSIS — G8929 Other chronic pain: Secondary | ICD-10-CM | POA: Diagnosis not present

## 2020-02-25 NOTE — Telephone Encounter (Signed)
Task completed. Tabitha from [facility] has been updated. She will return a call back to the clinic with pt's INR check when available.

## 2020-02-28 DIAGNOSIS — I4891 Unspecified atrial fibrillation: Secondary | ICD-10-CM | POA: Diagnosis not present

## 2020-02-28 DIAGNOSIS — M1990 Unspecified osteoarthritis, unspecified site: Secondary | ICD-10-CM | POA: Diagnosis not present

## 2020-02-28 DIAGNOSIS — H3530 Unspecified macular degeneration: Secondary | ICD-10-CM | POA: Diagnosis not present

## 2020-02-28 DIAGNOSIS — N189 Chronic kidney disease, unspecified: Secondary | ICD-10-CM | POA: Diagnosis not present

## 2020-02-28 DIAGNOSIS — G8929 Other chronic pain: Secondary | ICD-10-CM | POA: Diagnosis not present

## 2020-02-28 DIAGNOSIS — Z7901 Long term (current) use of anticoagulants: Secondary | ICD-10-CM | POA: Diagnosis not present

## 2020-02-28 DIAGNOSIS — I129 Hypertensive chronic kidney disease with stage 1 through stage 4 chronic kidney disease, or unspecified chronic kidney disease: Secondary | ICD-10-CM | POA: Diagnosis not present

## 2020-02-28 DIAGNOSIS — R32 Unspecified urinary incontinence: Secondary | ICD-10-CM | POA: Diagnosis not present

## 2020-02-29 ENCOUNTER — Telehealth: Payer: Self-pay

## 2020-02-29 DIAGNOSIS — R9082 White matter disease, unspecified: Secondary | ICD-10-CM | POA: Diagnosis not present

## 2020-02-29 DIAGNOSIS — R413 Other amnesia: Secondary | ICD-10-CM | POA: Diagnosis not present

## 2020-02-29 DIAGNOSIS — Z7901 Long term (current) use of anticoagulants: Secondary | ICD-10-CM | POA: Diagnosis not present

## 2020-02-29 DIAGNOSIS — M1990 Unspecified osteoarthritis, unspecified site: Secondary | ICD-10-CM | POA: Diagnosis not present

## 2020-02-29 DIAGNOSIS — N189 Chronic kidney disease, unspecified: Secondary | ICD-10-CM | POA: Diagnosis not present

## 2020-02-29 DIAGNOSIS — I4891 Unspecified atrial fibrillation: Secondary | ICD-10-CM | POA: Diagnosis not present

## 2020-02-29 DIAGNOSIS — H3530 Unspecified macular degeneration: Secondary | ICD-10-CM | POA: Diagnosis not present

## 2020-02-29 DIAGNOSIS — R32 Unspecified urinary incontinence: Secondary | ICD-10-CM | POA: Diagnosis not present

## 2020-02-29 DIAGNOSIS — I129 Hypertensive chronic kidney disease with stage 1 through stage 4 chronic kidney disease, or unspecified chronic kidney disease: Secondary | ICD-10-CM | POA: Diagnosis not present

## 2020-02-29 DIAGNOSIS — G8929 Other chronic pain: Secondary | ICD-10-CM | POA: Diagnosis not present

## 2020-02-29 NOTE — Telephone Encounter (Signed)
Tabitha from [facility] left a message this afternoon stating that pt's INR was 2.2   Reports no bruising or bleeding Reports taking 7.5 mg Sunday, Tuesday, Thursday and Saturday. 5 mg the rest of the week.Lawerance Bach was informed by Deliah Boston for pt to continue with current regimen and to check INR in 1 week. Aware clinic will contact [place], if provider makes any adjustments to pt's current anticoagulation therapy.

## 2020-02-29 NOTE — Telephone Encounter (Signed)
Sherle Poe called wanting verbal orders for patient to receive speech therapy 1x week for 6 weeks. I attempted to return call to [facility]. Unable to leave a message due to phone disconnection and no voicemail available.

## 2020-03-02 NOTE — Telephone Encounter (Signed)
Agree w below

## 2020-03-04 DIAGNOSIS — R32 Unspecified urinary incontinence: Secondary | ICD-10-CM | POA: Diagnosis not present

## 2020-03-04 DIAGNOSIS — M1990 Unspecified osteoarthritis, unspecified site: Secondary | ICD-10-CM | POA: Diagnosis not present

## 2020-03-04 DIAGNOSIS — I129 Hypertensive chronic kidney disease with stage 1 through stage 4 chronic kidney disease, or unspecified chronic kidney disease: Secondary | ICD-10-CM | POA: Diagnosis not present

## 2020-03-04 DIAGNOSIS — G8929 Other chronic pain: Secondary | ICD-10-CM | POA: Diagnosis not present

## 2020-03-04 DIAGNOSIS — I4891 Unspecified atrial fibrillation: Secondary | ICD-10-CM | POA: Diagnosis not present

## 2020-03-04 DIAGNOSIS — Z7901 Long term (current) use of anticoagulants: Secondary | ICD-10-CM | POA: Diagnosis not present

## 2020-03-04 DIAGNOSIS — H3530 Unspecified macular degeneration: Secondary | ICD-10-CM | POA: Diagnosis not present

## 2020-03-04 DIAGNOSIS — N189 Chronic kidney disease, unspecified: Secondary | ICD-10-CM | POA: Diagnosis not present

## 2020-03-05 DIAGNOSIS — T8459XD Infection and inflammatory reaction due to other internal joint prosthesis, subsequent encounter: Secondary | ICD-10-CM | POA: Diagnosis not present

## 2020-03-05 DIAGNOSIS — D46C Myelodysplastic syndrome with isolated del(5q) chromosomal abnormality: Secondary | ICD-10-CM | POA: Diagnosis not present

## 2020-03-05 DIAGNOSIS — D803 Selective deficiency of immunoglobulin G [IgG] subclasses: Secondary | ICD-10-CM | POA: Diagnosis not present

## 2020-03-05 DIAGNOSIS — Z96659 Presence of unspecified artificial knee joint: Secondary | ICD-10-CM | POA: Diagnosis not present

## 2020-03-05 DIAGNOSIS — I10 Essential (primary) hypertension: Secondary | ICD-10-CM | POA: Diagnosis not present

## 2020-03-06 DIAGNOSIS — R32 Unspecified urinary incontinence: Secondary | ICD-10-CM | POA: Diagnosis not present

## 2020-03-06 DIAGNOSIS — I4891 Unspecified atrial fibrillation: Secondary | ICD-10-CM | POA: Diagnosis not present

## 2020-03-06 DIAGNOSIS — M1990 Unspecified osteoarthritis, unspecified site: Secondary | ICD-10-CM | POA: Diagnosis not present

## 2020-03-06 DIAGNOSIS — G8929 Other chronic pain: Secondary | ICD-10-CM | POA: Diagnosis not present

## 2020-03-06 DIAGNOSIS — Z7901 Long term (current) use of anticoagulants: Secondary | ICD-10-CM | POA: Diagnosis not present

## 2020-03-06 DIAGNOSIS — I129 Hypertensive chronic kidney disease with stage 1 through stage 4 chronic kidney disease, or unspecified chronic kidney disease: Secondary | ICD-10-CM | POA: Diagnosis not present

## 2020-03-06 DIAGNOSIS — H3530 Unspecified macular degeneration: Secondary | ICD-10-CM | POA: Diagnosis not present

## 2020-03-06 DIAGNOSIS — N189 Chronic kidney disease, unspecified: Secondary | ICD-10-CM | POA: Diagnosis not present

## 2020-03-07 ENCOUNTER — Telehealth: Payer: Self-pay

## 2020-03-07 DIAGNOSIS — G8929 Other chronic pain: Secondary | ICD-10-CM | POA: Diagnosis not present

## 2020-03-07 DIAGNOSIS — N189 Chronic kidney disease, unspecified: Secondary | ICD-10-CM | POA: Diagnosis not present

## 2020-03-07 DIAGNOSIS — Z7901 Long term (current) use of anticoagulants: Secondary | ICD-10-CM | POA: Diagnosis not present

## 2020-03-07 DIAGNOSIS — M1990 Unspecified osteoarthritis, unspecified site: Secondary | ICD-10-CM | POA: Diagnosis not present

## 2020-03-07 DIAGNOSIS — I129 Hypertensive chronic kidney disease with stage 1 through stage 4 chronic kidney disease, or unspecified chronic kidney disease: Secondary | ICD-10-CM | POA: Diagnosis not present

## 2020-03-07 DIAGNOSIS — I4891 Unspecified atrial fibrillation: Secondary | ICD-10-CM | POA: Diagnosis not present

## 2020-03-07 DIAGNOSIS — H3530 Unspecified macular degeneration: Secondary | ICD-10-CM | POA: Diagnosis not present

## 2020-03-07 DIAGNOSIS — R32 Unspecified urinary incontinence: Secondary | ICD-10-CM | POA: Diagnosis not present

## 2020-03-07 NOTE — Telephone Encounter (Signed)
Routing to covering provider. RN Gigi from [facility] called with INR results.   INR: 2.2 PT: 26.9  Will patient continue the current therapy? Pt taking 5 mg M/W/F and 7.5 mg S/T/TH/S. Pls advise, thanks.

## 2020-03-07 NOTE — Telephone Encounter (Signed)
INR at goal. Continue current regimen.  

## 2020-03-08 DIAGNOSIS — Z7901 Long term (current) use of anticoagulants: Secondary | ICD-10-CM | POA: Diagnosis not present

## 2020-03-08 DIAGNOSIS — I4891 Unspecified atrial fibrillation: Secondary | ICD-10-CM | POA: Diagnosis not present

## 2020-03-08 DIAGNOSIS — I129 Hypertensive chronic kidney disease with stage 1 through stage 4 chronic kidney disease, or unspecified chronic kidney disease: Secondary | ICD-10-CM | POA: Diagnosis not present

## 2020-03-08 DIAGNOSIS — G8929 Other chronic pain: Secondary | ICD-10-CM | POA: Diagnosis not present

## 2020-03-08 DIAGNOSIS — M1990 Unspecified osteoarthritis, unspecified site: Secondary | ICD-10-CM | POA: Diagnosis not present

## 2020-03-08 DIAGNOSIS — R32 Unspecified urinary incontinence: Secondary | ICD-10-CM | POA: Diagnosis not present

## 2020-03-08 DIAGNOSIS — N189 Chronic kidney disease, unspecified: Secondary | ICD-10-CM | POA: Diagnosis not present

## 2020-03-08 DIAGNOSIS — H3530 Unspecified macular degeneration: Secondary | ICD-10-CM | POA: Diagnosis not present

## 2020-03-10 DIAGNOSIS — H3530 Unspecified macular degeneration: Secondary | ICD-10-CM | POA: Diagnosis not present

## 2020-03-10 DIAGNOSIS — I4891 Unspecified atrial fibrillation: Secondary | ICD-10-CM | POA: Diagnosis not present

## 2020-03-10 DIAGNOSIS — N189 Chronic kidney disease, unspecified: Secondary | ICD-10-CM | POA: Diagnosis not present

## 2020-03-10 DIAGNOSIS — Z7901 Long term (current) use of anticoagulants: Secondary | ICD-10-CM | POA: Diagnosis not present

## 2020-03-10 DIAGNOSIS — M1990 Unspecified osteoarthritis, unspecified site: Secondary | ICD-10-CM | POA: Diagnosis not present

## 2020-03-10 DIAGNOSIS — R32 Unspecified urinary incontinence: Secondary | ICD-10-CM | POA: Diagnosis not present

## 2020-03-10 DIAGNOSIS — G8929 Other chronic pain: Secondary | ICD-10-CM | POA: Diagnosis not present

## 2020-03-10 DIAGNOSIS — I129 Hypertensive chronic kidney disease with stage 1 through stage 4 chronic kidney disease, or unspecified chronic kidney disease: Secondary | ICD-10-CM | POA: Diagnosis not present

## 2020-03-11 DIAGNOSIS — Z7901 Long term (current) use of anticoagulants: Secondary | ICD-10-CM | POA: Diagnosis not present

## 2020-03-11 DIAGNOSIS — H3530 Unspecified macular degeneration: Secondary | ICD-10-CM | POA: Diagnosis not present

## 2020-03-11 DIAGNOSIS — M1990 Unspecified osteoarthritis, unspecified site: Secondary | ICD-10-CM | POA: Diagnosis not present

## 2020-03-11 DIAGNOSIS — I129 Hypertensive chronic kidney disease with stage 1 through stage 4 chronic kidney disease, or unspecified chronic kidney disease: Secondary | ICD-10-CM | POA: Diagnosis not present

## 2020-03-11 DIAGNOSIS — R32 Unspecified urinary incontinence: Secondary | ICD-10-CM | POA: Diagnosis not present

## 2020-03-11 DIAGNOSIS — G8929 Other chronic pain: Secondary | ICD-10-CM | POA: Diagnosis not present

## 2020-03-11 DIAGNOSIS — N189 Chronic kidney disease, unspecified: Secondary | ICD-10-CM | POA: Diagnosis not present

## 2020-03-11 DIAGNOSIS — I4891 Unspecified atrial fibrillation: Secondary | ICD-10-CM | POA: Diagnosis not present

## 2020-03-11 NOTE — Telephone Encounter (Signed)
Task completed. Left a detailed vm msg for RN Pieter Partridge regarding covering provider's note.

## 2020-03-12 DIAGNOSIS — H3530 Unspecified macular degeneration: Secondary | ICD-10-CM | POA: Diagnosis not present

## 2020-03-12 DIAGNOSIS — I4891 Unspecified atrial fibrillation: Secondary | ICD-10-CM | POA: Diagnosis not present

## 2020-03-12 DIAGNOSIS — G8929 Other chronic pain: Secondary | ICD-10-CM | POA: Diagnosis not present

## 2020-03-12 DIAGNOSIS — N189 Chronic kidney disease, unspecified: Secondary | ICD-10-CM | POA: Diagnosis not present

## 2020-03-12 DIAGNOSIS — I129 Hypertensive chronic kidney disease with stage 1 through stage 4 chronic kidney disease, or unspecified chronic kidney disease: Secondary | ICD-10-CM | POA: Diagnosis not present

## 2020-03-12 DIAGNOSIS — M1990 Unspecified osteoarthritis, unspecified site: Secondary | ICD-10-CM | POA: Diagnosis not present

## 2020-03-12 DIAGNOSIS — R32 Unspecified urinary incontinence: Secondary | ICD-10-CM | POA: Diagnosis not present

## 2020-03-12 DIAGNOSIS — Z7901 Long term (current) use of anticoagulants: Secondary | ICD-10-CM | POA: Diagnosis not present

## 2020-03-18 DIAGNOSIS — Z86711 Personal history of pulmonary embolism: Secondary | ICD-10-CM | POA: Diagnosis not present

## 2020-03-18 DIAGNOSIS — I480 Paroxysmal atrial fibrillation: Secondary | ICD-10-CM | POA: Diagnosis not present

## 2020-03-18 DIAGNOSIS — I10 Essential (primary) hypertension: Secondary | ICD-10-CM | POA: Diagnosis not present

## 2020-03-19 DIAGNOSIS — R32 Unspecified urinary incontinence: Secondary | ICD-10-CM | POA: Diagnosis not present

## 2020-03-19 DIAGNOSIS — Z7901 Long term (current) use of anticoagulants: Secondary | ICD-10-CM | POA: Diagnosis not present

## 2020-03-19 DIAGNOSIS — N189 Chronic kidney disease, unspecified: Secondary | ICD-10-CM | POA: Diagnosis not present

## 2020-03-19 DIAGNOSIS — H3530 Unspecified macular degeneration: Secondary | ICD-10-CM | POA: Diagnosis not present

## 2020-03-19 DIAGNOSIS — I4891 Unspecified atrial fibrillation: Secondary | ICD-10-CM | POA: Diagnosis not present

## 2020-03-19 DIAGNOSIS — M1990 Unspecified osteoarthritis, unspecified site: Secondary | ICD-10-CM | POA: Diagnosis not present

## 2020-03-19 DIAGNOSIS — G8929 Other chronic pain: Secondary | ICD-10-CM | POA: Diagnosis not present

## 2020-03-19 DIAGNOSIS — I129 Hypertensive chronic kidney disease with stage 1 through stage 4 chronic kidney disease, or unspecified chronic kidney disease: Secondary | ICD-10-CM | POA: Diagnosis not present

## 2020-03-21 ENCOUNTER — Other Ambulatory Visit: Payer: Self-pay | Admitting: Sports Medicine

## 2020-03-21 DIAGNOSIS — M48061 Spinal stenosis, lumbar region without neurogenic claudication: Secondary | ICD-10-CM

## 2020-03-24 DIAGNOSIS — I4891 Unspecified atrial fibrillation: Secondary | ICD-10-CM | POA: Diagnosis not present

## 2020-03-24 DIAGNOSIS — M1990 Unspecified osteoarthritis, unspecified site: Secondary | ICD-10-CM | POA: Diagnosis not present

## 2020-03-24 DIAGNOSIS — I129 Hypertensive chronic kidney disease with stage 1 through stage 4 chronic kidney disease, or unspecified chronic kidney disease: Secondary | ICD-10-CM | POA: Diagnosis not present

## 2020-03-24 DIAGNOSIS — Z7901 Long term (current) use of anticoagulants: Secondary | ICD-10-CM | POA: Diagnosis not present

## 2020-03-24 DIAGNOSIS — G8929 Other chronic pain: Secondary | ICD-10-CM | POA: Diagnosis not present

## 2020-03-24 DIAGNOSIS — H3530 Unspecified macular degeneration: Secondary | ICD-10-CM | POA: Diagnosis not present

## 2020-03-24 DIAGNOSIS — N189 Chronic kidney disease, unspecified: Secondary | ICD-10-CM | POA: Diagnosis not present

## 2020-03-24 DIAGNOSIS — R32 Unspecified urinary incontinence: Secondary | ICD-10-CM | POA: Diagnosis not present

## 2020-03-25 DIAGNOSIS — I129 Hypertensive chronic kidney disease with stage 1 through stage 4 chronic kidney disease, or unspecified chronic kidney disease: Secondary | ICD-10-CM | POA: Diagnosis not present

## 2020-03-25 DIAGNOSIS — N189 Chronic kidney disease, unspecified: Secondary | ICD-10-CM | POA: Diagnosis not present

## 2020-03-25 DIAGNOSIS — M1990 Unspecified osteoarthritis, unspecified site: Secondary | ICD-10-CM | POA: Diagnosis not present

## 2020-03-25 DIAGNOSIS — G8929 Other chronic pain: Secondary | ICD-10-CM | POA: Diagnosis not present

## 2020-03-25 DIAGNOSIS — R32 Unspecified urinary incontinence: Secondary | ICD-10-CM | POA: Diagnosis not present

## 2020-03-25 DIAGNOSIS — H3530 Unspecified macular degeneration: Secondary | ICD-10-CM | POA: Diagnosis not present

## 2020-03-25 DIAGNOSIS — I4891 Unspecified atrial fibrillation: Secondary | ICD-10-CM | POA: Diagnosis not present

## 2020-03-25 DIAGNOSIS — Z7901 Long term (current) use of anticoagulants: Secondary | ICD-10-CM | POA: Diagnosis not present

## 2020-03-27 ENCOUNTER — Encounter: Payer: Self-pay | Admitting: Osteopathic Medicine

## 2020-03-27 ENCOUNTER — Ambulatory Visit (INDEPENDENT_AMBULATORY_CARE_PROVIDER_SITE_OTHER): Payer: Medicare HMO | Admitting: Osteopathic Medicine

## 2020-03-27 VITALS — BP 150/74 | HR 49 | Temp 99.0°F | Wt 113.0 lb

## 2020-03-27 DIAGNOSIS — Z7901 Long term (current) use of anticoagulants: Secondary | ICD-10-CM | POA: Diagnosis not present

## 2020-03-27 DIAGNOSIS — N183 Chronic kidney disease, stage 3 unspecified: Secondary | ICD-10-CM

## 2020-03-27 DIAGNOSIS — I4891 Unspecified atrial fibrillation: Secondary | ICD-10-CM

## 2020-03-27 DIAGNOSIS — E538 Deficiency of other specified B group vitamins: Secondary | ICD-10-CM

## 2020-03-27 NOTE — Progress Notes (Signed)
Gloria Mckay is a 79 y.o. female who presents to  [place] at [facility]  today, 03/27/20, seeking care for the following:  . Follow up B12 - started by Dr T, as well as Folate. Elevated methylmalonic acid and homocysteine levels. B12 last checked 02/08/2020 . Would like to get on Prolia, request lab recheck  . INR last 03/07/2020 was 2.2, check today keeps yielding high values, no bleeding  . Significant pain issues - some days feel like can hardly walk. Taking hydrocodone-APAP 10-325 mg half to whole tablets, doesn't like taking these since they make her sleepy but she's determined to stay active.     ASSESSMENT & PLAN with other pertinent findings:  The primary encounter diagnosis was Long term (current) use of anticoagulants. Diagnoses of B12 deficiency, Atrial fibrillation, unspecified type ([place]), and Stage 3 chronic kidney disease, unspecified whether stage 3a or 3b CKD were also pertinent to this visit.   May consider pain management referral - renal function an issue w/ NSAIDs, may try alternate opiates, I advised consider water therapy for minimizing joint impact   No results found for this or any previous visit (from the past 24 hour(s)).  There are no Patient Instructions on file for this visit.  Orders Placed This Encounter  Procedures  . B12 and Folate Panel  . Homocysteine  . Methylmalonic Acid  . INR/PT  . COMPLETE METABOLIC PANEL WITH GFR  . POCT INR    No orders of the defined types were placed in this encounter.      Follow-up instructions: Return for VIRTUAL VISIT WHEN DUE FOR PAIN RX REFILL TO DISCUSS ALTERNATE PRESCRIPTION .                                         BP (!) 150/74 (BP Location: Left Arm, Patient Position: Sitting)   Pulse 49   Temp 99 F (37.2 C)   Wt 113 lb (51.3 kg)   SpO2 99%   BMI 20.02 kg/m   Current Meds  Medication Sig  . amLODipine (NORVASC)  10 MG tablet Take 1 tablet (10 mg total) by mouth daily.  . Calcium Carbonate-Vitamin D 600-400 MG-UNIT chew tablet Chew 2 tablets by mouth daily.  . carvedilol (COREG) 25 MG tablet Take 1 tablet (25 mg total) by mouth 2 (two) times daily with a meal.  . clindamycin (CLEOCIN) 300 MG capsule TAKE 1 CAPSULE BY MOUTH TWICE A DAY  . cyanocobalamin (,VITAMIN B-12,) 1000 MCG/ML injection Inject 1 mL (1,000 mcg total) into the muscle every 30 (thirty) days. Please include syringes and needles as needed for monthly injections  . denosumab (PROLIA) 60 MG/ML SOSY injection Inject 60 mg into the skin every 6 (six) months. Administer in upper arm, thigh, or abdomen  . hydrALAZINE (APRESOLINE) 25 MG tablet Take 1 tablet (25 mg total) by mouth 2 (two) times daily.  Marland Kitchen HYDROcodone-acetaminophen (NORCO) 10-325 MG tablet TAKE ONE TABLET BY MOUTH TWICE DAILY  . hydrOXYzine (ATARAX/VISTARIL) 25 MG tablet Take 0.5-1 tablets (12.5-25 mg total) by mouth every 8 (eight) hours as needed for itching.  . losartan (COZAAR) 100 MG tablet Take 1 tablet (100 mg total) by mouth daily.  . Omega-3 Fatty Acids ([employer]S FISH OIL PO) Take by mouth.  . Prasterone (INTRAROSA) 6.5 MG INST Place 1 suppository vaginally at bedtime.  . topiramate (TOPAMAX) 100  MG tablet Take 1.5 tabs (150 mg) in AM and 2 tabs (200 mg) in PM  . Vitamin D, Ergocalciferol, (DRISDOL) 1.25 MG (50000 UT) CAPS capsule Take 1 capsule (50,000 Units total) by mouth every 7 (seven) days.  Marland Kitchen warfarin (COUMADIN) 5 MG tablet Take 1 tablet (5 mg total) by mouth every evening. TAKING 7.5 MG MON WED FRI, 5 MG ALL OTHER DAYS AS OF 02/01/20    No results found for this or any previous visit (from the past 72 hour(s)).  No results found.     All questions at time of visit were answered - patient instructed to contact office with any additional concerns or updates.  ER/RTC precautions were reviewed with the patient as applicable.   Please note: voice recognition software  was used to produce this document, and typos may escape review. Please contact Dr. Sheppard Coil for any needed clarifications.   Total encounter time: 30 minutes.

## 2020-03-28 ENCOUNTER — Ambulatory Visit: Payer: Medicare HMO | Admitting: Osteopathic Medicine

## 2020-03-28 DIAGNOSIS — I4891 Unspecified atrial fibrillation: Secondary | ICD-10-CM | POA: Diagnosis not present

## 2020-03-28 DIAGNOSIS — Z7901 Long term (current) use of anticoagulants: Secondary | ICD-10-CM | POA: Diagnosis not present

## 2020-03-28 DIAGNOSIS — G8929 Other chronic pain: Secondary | ICD-10-CM | POA: Diagnosis not present

## 2020-03-28 DIAGNOSIS — I129 Hypertensive chronic kidney disease with stage 1 through stage 4 chronic kidney disease, or unspecified chronic kidney disease: Secondary | ICD-10-CM | POA: Diagnosis not present

## 2020-03-28 DIAGNOSIS — R32 Unspecified urinary incontinence: Secondary | ICD-10-CM | POA: Diagnosis not present

## 2020-03-28 DIAGNOSIS — M1990 Unspecified osteoarthritis, unspecified site: Secondary | ICD-10-CM | POA: Diagnosis not present

## 2020-03-28 DIAGNOSIS — N189 Chronic kidney disease, unspecified: Secondary | ICD-10-CM | POA: Diagnosis not present

## 2020-03-28 DIAGNOSIS — H3530 Unspecified macular degeneration: Secondary | ICD-10-CM | POA: Diagnosis not present

## 2020-03-28 LAB — COMPLETE METABOLIC PANEL WITH GFR
AG Ratio: 1.9 (calc) (ref 1.0–2.5)
ALT: 12 U/L (ref 6–29)
AST: 15 U/L (ref 10–35)
Albumin: 3.8 g/dL (ref 3.6–5.1)
Alkaline phosphatase (APISO): 115 U/L (ref 37–153)
BUN/Creatinine Ratio: 24 (calc) — ABNORMAL HIGH (ref 6–22)
BUN: 46 mg/dL — ABNORMAL HIGH (ref 7–25)
CO2: 17 mmol/L — ABNORMAL LOW (ref 20–32)
Calcium: 8.2 mg/dL — ABNORMAL LOW (ref 8.6–10.4)
Chloride: 120 mmol/L — ABNORMAL HIGH (ref 98–110)
Creat: 1.89 mg/dL — ABNORMAL HIGH (ref 0.60–0.93)
GFR, Est African American: 29 mL/min/{1.73_m2} — ABNORMAL LOW (ref >=60)
GFR, Est Non African American: 25 mL/min/{1.73_m2} — ABNORMAL LOW (ref >=60)
Globulin: 2 g/dL (calc) (ref 1.9–3.7)
Glucose, Bld: 104 mg/dL — ABNORMAL HIGH (ref 65–99)
Potassium: 5.2 mmol/L (ref 3.5–5.3)
Sodium: 145 mmol/L (ref 135–146)
Total Bilirubin: 0.2 mg/dL (ref 0.2–1.2)
Total Protein: 5.8 g/dL — ABNORMAL LOW (ref 6.1–8.1)

## 2020-03-31 LAB — HOMOCYSTEINE: Homocysteine: 28.2 umol/L — ABNORMAL HIGH (ref <10.4)

## 2020-03-31 LAB — B12 AND FOLATE PANEL
Folate: 5.5 ng/mL
Vitamin B-12: 383 pg/mL (ref 200–1100)

## 2020-03-31 LAB — PROTIME-INR
INR: 2.6 — ABNORMAL HIGH
Prothrombin Time: 26.5 s — ABNORMAL HIGH (ref 9.0–11.5)

## 2020-03-31 LAB — METHYLMALONIC ACID, SERUM: Methylmalonic Acid, Quant: 376 nmol/L — ABNORMAL HIGH (ref 87–318)

## 2020-04-01 ENCOUNTER — Ambulatory Visit (INDEPENDENT_AMBULATORY_CARE_PROVIDER_SITE_OTHER): Payer: Medicare HMO | Admitting: Osteopathic Medicine

## 2020-04-01 ENCOUNTER — Encounter: Payer: Self-pay | Admitting: Osteopathic Medicine

## 2020-04-01 ENCOUNTER — Other Ambulatory Visit: Payer: Self-pay

## 2020-04-01 VITALS — BP 146/80 | HR 63

## 2020-04-01 DIAGNOSIS — M51369 Other intervertebral disc degeneration, lumbar region without mention of lumbar back pain or lower extremity pain: Secondary | ICD-10-CM

## 2020-04-01 DIAGNOSIS — M25571 Pain in right ankle and joints of right foot: Secondary | ICD-10-CM | POA: Diagnosis not present

## 2020-04-01 DIAGNOSIS — T8459XD Infection and inflammatory reaction due to other internal joint prosthesis, subsequent encounter: Secondary | ICD-10-CM

## 2020-04-01 DIAGNOSIS — S32000S Wedge compression fracture of unspecified lumbar vertebra, sequela: Secondary | ICD-10-CM

## 2020-04-01 DIAGNOSIS — M5030 Other cervical disc degeneration, unspecified cervical region: Secondary | ICD-10-CM | POA: Diagnosis not present

## 2020-04-01 DIAGNOSIS — Z96659 Presence of unspecified artificial knee joint: Secondary | ICD-10-CM

## 2020-04-01 DIAGNOSIS — M48061 Spinal stenosis, lumbar region without neurogenic claudication: Secondary | ICD-10-CM | POA: Diagnosis not present

## 2020-04-01 DIAGNOSIS — M5136 Other intervertebral disc degeneration, lumbar region: Secondary | ICD-10-CM | POA: Diagnosis not present

## 2020-04-01 DIAGNOSIS — G8929 Other chronic pain: Secondary | ICD-10-CM | POA: Diagnosis not present

## 2020-04-01 DIAGNOSIS — M25572 Pain in left ankle and joints of left foot: Secondary | ICD-10-CM

## 2020-04-01 NOTE — Patient Instructions (Signed)
PLAN:   Will refer for water physical therapy   Will refer for pain management

## 2020-04-01 NOTE — Progress Notes (Signed)
HPI: Gloria Mckay is a 79 y.o. female who  has a past medical history of Arthritis, Cancer ([place]), Cataract, Hypertension, Myelodysplasia (myelodysplastic syndrome) ([place]), and Myelodysplastic syndrome ([place]).  she presents to [facility] today, 04/01/20,  for chief complaint of: Chronic pain  Patient reports her chronic pain, particularly in her ankles, has worsened over the last several weeks. She states the pain is a burning in her lower leg, and when she tries to stand, her legs feel like they will break or collapse. She was in the office last week to discuss her chronic pain, and we discussed increasing her hydrocodone or changing it to oxycodone at that time. She was concerned about the hydrocodone making her tired, so she didn't want to increase the dose. On review of her records, she is allergic to oxycodone. We also discussed possibly referring to pain management and aquatic physical therapy. Currently, as the pain has not improved, she is willing to discuss these options again.  Patient is accompanied by her granddaughter who assists with history-taking.   Past medical, surgical, social and family history reviewed:  Patient Active Problem List   Diagnosis Date Noted   B12 deficiency 02/08/2020   Acute abdominal pain 02/08/2020   Myelodysplastic syndrome with 5 q minus ([place]) 08/28/2018   Accidental fall 08/11/2018   Headache 06/16/2018   Atrial tachycardia ([place]) 05/08/2018   Pain in right hip 03/21/2018   Vertebral compression fracture ([place]) 03/21/2018   Anemia of chronic disease 11/08/2017   Bronchiectasis with acute lower respiratory infection ([place]) 10/28/2017   Vocal tremor 10/13/2017   Chronic infection of prosthetic knee, subsequent encounter 09/06/2017   Biclonal gammopathy 08/12/2017   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[place]) 08/12/2017   Pseudomonas infection 04/25/2017   Long term (current) use of anticoagulants [Z79.01] 02/24/2017    Primary hypercoagulable state ([place]) [D68.59] 02/24/2017   Atrial fibrillation ([place]) [I48.91] 02/24/2017   DDD (degenerative disc disease), cervical 02/11/2017   Elevated MCV 01/11/2017   History of partial gastrectomy 01/11/2017   Benign hypertension with CKD (chronic kidney disease) stage III 10/28/2016   History of arthroplasty of left knee 10/25/2016   Plantar fasciitis, left 10/22/2016   Fibroma of tongue 06/14/2016   Postmenopausal osteoporosis 02/06/2016   History of pulmonary embolism 01/30/2016   CKD (chronic kidney disease) stage 3, GFR 30-59 ml/min 08/19/2015   Dry eye syndrome of both lacrimal glands 08/19/2015   Status post cataract extraction and insertion of intraocular lens of left eye 08/19/2015   Chronic hypernatremia 08/19/2015   Cortical age-related cataract of right eye 08/19/2015   Nuclear sclerotic cataract of right eye 08/19/2015   CKD stage G3b/A1, GFR 30-44 and albumin creatinine ratio <30 mg/g 08/19/2015   Iron deficiency anemia due to chronic blood loss 02/21/2015   AMD (age-related macular degeneration), bilateral 02/05/2015   Congenital hypertrophy of retinal pigment epithelium 02/05/2015   Endothelial corneal dystrophy 02/05/2015   Cornea guttata 02/05/2015   Lumbar spinal stenosis 05/09/2014   S/P colonoscopy 01/23/2014   S/P endoscopy 01/23/2014   Gastric bezoar 08/06/2013   History of MRSA infection 12/30/2012   Long term current use of anticoagulant therapy 08/23/2011   Mild intermittent asthma without complication 10/62/6948   Chest pain 01/15/2010   Esophageal reflux 11/05/2009   Gastroesophageal reflux disease without esophagitis 11/05/2009   DDD (degenerative disc disease), lumbar 09/19/2009   Infection and inflammatory reaction due to internal joint prosthesis ([place]) 08/08/2009   Vitamin D deficiency 05/12/2009   Chronic nausea 02/12/2009  History of DVT of lower extremity 02/12/2009   Recurrent major  depressive disorder, in partial remission ([place]) 02/12/2009    Past Surgical History:  Procedure Laterality Date   JOINT REPLACEMENT     STOMACH SURGERY      Social History   Tobacco Use   Smoking status: Never Smoker   Smokeless tobacco: Never Used  Substance Use Topics   Alcohol use: No    Family History  Problem Relation Age of Onset   Hypertension Mother    Pulmonary embolism Mother    Heart disease Father      Current medication list and allergy/intolerance information reviewed:    Current Outpatient Medications  Medication Sig Dispense Refill   amLODipine (NORVASC) 10 MG tablet Take 1 tablet (10 mg total) by mouth daily. 90 tablet 3   Calcium Carbonate-Vitamin D 600-400 MG-UNIT chew tablet Chew 2 tablets by mouth daily. 60 tablet 12   carvedilol (COREG) 25 MG tablet Take 1 tablet (25 mg total) by mouth 2 (two) times daily with a meal. 180 tablet 0   clindamycin (CLEOCIN) 300 MG capsule TAKE 1 CAPSULE BY MOUTH TWICE A DAY     cyanocobalamin (,VITAMIN B-12,) 1000 MCG/ML injection Inject 1 mL (1,000 mcg total) into the muscle every 30 (thirty) days. Please include syringes and needles as needed for monthly injections 30 mL 11   denosumab (PROLIA) 60 MG/ML SOSY injection Inject 60 mg into the skin every 6 (six) months. Administer in upper arm, thigh, or abdomen 1 Syringe 1   hydrALAZINE (APRESOLINE) 25 MG tablet Take 1 tablet (25 mg total) by mouth 2 (two) times daily. 180 tablet 3   HYDROcodone-acetaminophen (NORCO) 10-325 MG tablet TAKE ONE TABLET BY MOUTH TWICE DAILY 60 tablet 0   hydrOXYzine (ATARAX/VISTARIL) 25 MG tablet Take 0.5-1 tablets (12.5-25 mg total) by mouth every 8 (eight) hours as needed for itching. 30 tablet 0   losartan (COZAAR) 100 MG tablet Take 1 tablet (100 mg total) by mouth daily. 90 tablet 1   Omega-3 Fatty Acids (CVS FISH OIL PO) Take by mouth.     Prasterone (INTRAROSA) 6.5 MG INST Place 1 suppository vaginally at bedtime. 30  each 12   topiramate (TOPAMAX) 100 MG tablet Take 1.5 tabs (150 mg) in AM and 2 tabs (200 mg) in PM 90 tablet 1   Vitamin D, Ergocalciferol, (DRISDOL) 1.25 MG (50000 UT) CAPS capsule Take 1 capsule (50,000 Units total) by mouth every 7 (seven) days. 12 capsule 1   warfarin (COUMADIN) 5 MG tablet Take 1 tablet (5 mg total) by mouth every evening. TAKING 7.5 MG MON WED FRI, 5 MG ALL OTHER DAYS AS OF 02/01/20 30 tablet 1   No current facility-administered medications for this visit.    Allergies  Allergen Reactions   Avocado Shortness Of Breath   Cefuroxime Anaphylaxis    Difficulty swallowing pills because they were too dry.  Caused tablet dysphagia.   Diazepam Shortness Of Breath    HYPERVENTILATION      Diphenhydramine Palpitations    Affected heart rate/er told her not to take again   Latex Rash and Shortness Of Breath    Airway swelling   Other Palpitations    Affected heart rate/er told her not to take again   Amlodipine Cough and Other (See Comments)    Not sure   Butorphanol Hives   Diclofenac Hives   Diclofenac Sodium Hives   Diphenhydramine Hcl Palpitations    Affected heart rate/er told her  not to take again   Methylpyrrolidone Hives   Ondansetron Hives and Other (See Comments)    Sedates/knocks her out     Oxycodone Hives   Penicillins Hives and Swelling    Most mycin drugs   Quinolones Other (See Comments)    Joint pain   Sulfa Antibiotics Hives and Swelling   Butorphanol Tartrate Hives   Lisinopril Cough    Caused pt to cough   Nexium [Esomeprazole Magnesium] Diarrhea and Nausea And Vomiting   Ramipril Cough    Pt c/o cough   Ace Inhibitors Other (See Comments) and Cough    Lisinopril and ramipril    Ciprofloxacin Rash    Patient prefers not to take Fluoroquinolones    Codeine Nausea Only   Pantoprazole Sodium Diarrhea      Review of Systems:  Constitutional:  No  fever, no chills  HEENT: No  headache, no vision  change  Cardiac: No  chest pain  Respiratory:  No  shortness of breath. No  Cough  Gastrointestinal: No  abdominal pain, No  nausea, No  vomiting, No  diarrhea, No  constipation   Musculoskeletal: + chronic myalgias/arthralgias  Skin: No  Rash, No other wounds/concerning lesions  Neurologic: No  weakness, No  dizziness  Psychiatric: No  concerns with depression, No  concerns with anxiety, No sleep problems, No mood problems  Exam:  BP (!) 146/80 (BP Location: Left Arm, Patient Position: Sitting)    Pulse 63    SpO2 99%   Constitutional: VS see above. General Appearance: alert, well-developed, well-nourished, NAD  Eyes: Normal lids and conjunctive, non-icteric sclera  Neck: No masses, trachea midline.   Respiratory: Normal respiratory effort. no wheeze, no rhonchi, no rales  Cardiovascular: S1/S2 normal, no murmur, no rub/gallop auscultated. RRR.   Musculoskeletal: Gait steady w/ can but slow. No clubbing/cyanosis of digits.   Neurological: No tremor. No cranial nerve deficit on limited exam.   Skin: warm, dry, intact.   Psychiatric: Normal judgment/insight. Normal mood and affect. Oriented x3.    No results found for this or any previous visit (from the past 72 hour(s)).  No results found.   ASSESSMENT/PLAN: The primary encounter diagnosis was Chronic pain of both ankles. Diagnoses of Chronic infection of prosthetic knee, subsequent encounter, Compression fracture of lumbar vertebra, unspecified lumbar vertebral level, sequela, DDD (degenerative disc disease), cervical, Spinal stenosis of lumbar region without neurogenic claudication, and DDD (degenerative disc disease), lumbar were also pertinent to this visit.   Chronic pain in bilateral lower legs  Recommended patient try increasing hydrocodone to 1/2 tablet q4hrs for a few days, and then she can titrate down if it is impacting her activity.   Referred to pain management. At this point, given her complex medical  history and ongoing pain w/ multiple Rx intolerances/allergies, I think they would be best to figure out next steps for optimizing her pain management.  Referred to physical therapy to determine options for aquatic PT.   Orders Placed This Encounter  Procedures   Ambulatory referral to Physical Therapy   Ambulatory referral to Pain Clinic    No orders of the defined types were placed in this encounter.   Patient Instructions  PLAN:   Will refer for water physical therapy   Will refer for pain management          Visit summary with medication list and pertinent instructions was printed for patient to review. All questions at time of visit were answered - patient instructed  to contact office with any additional concerns or updates. ER/RTC precautions were reviewed with the patient.   Note: Total time spent 30 minutes, greater than 50% of the visit was spent face-to-face counseling and coordinating care for the above diagnoses listed in assessment/plan.   Please note: voice recognition software was used to produce this document, and typos may escape review. Please contact Dr. Sheppard Coil for any needed clarifications.     Follow-up plan: Return if symptoms worsen or fail to improve.  Gweneth Dimitri, [facility] MS3

## 2020-04-02 DIAGNOSIS — I4891 Unspecified atrial fibrillation: Secondary | ICD-10-CM | POA: Diagnosis not present

## 2020-04-02 DIAGNOSIS — N189 Chronic kidney disease, unspecified: Secondary | ICD-10-CM | POA: Diagnosis not present

## 2020-04-02 DIAGNOSIS — H3530 Unspecified macular degeneration: Secondary | ICD-10-CM | POA: Diagnosis not present

## 2020-04-02 DIAGNOSIS — R32 Unspecified urinary incontinence: Secondary | ICD-10-CM | POA: Diagnosis not present

## 2020-04-02 DIAGNOSIS — I129 Hypertensive chronic kidney disease with stage 1 through stage 4 chronic kidney disease, or unspecified chronic kidney disease: Secondary | ICD-10-CM | POA: Diagnosis not present

## 2020-04-02 DIAGNOSIS — Z7901 Long term (current) use of anticoagulants: Secondary | ICD-10-CM | POA: Diagnosis not present

## 2020-04-02 DIAGNOSIS — G8929 Other chronic pain: Secondary | ICD-10-CM | POA: Diagnosis not present

## 2020-04-02 DIAGNOSIS — M1990 Unspecified osteoarthritis, unspecified site: Secondary | ICD-10-CM | POA: Diagnosis not present

## 2020-04-03 DIAGNOSIS — Z7901 Long term (current) use of anticoagulants: Secondary | ICD-10-CM | POA: Diagnosis not present

## 2020-04-03 DIAGNOSIS — M1990 Unspecified osteoarthritis, unspecified site: Secondary | ICD-10-CM | POA: Diagnosis not present

## 2020-04-03 DIAGNOSIS — H3530 Unspecified macular degeneration: Secondary | ICD-10-CM | POA: Diagnosis not present

## 2020-04-03 DIAGNOSIS — N189 Chronic kidney disease, unspecified: Secondary | ICD-10-CM | POA: Diagnosis not present

## 2020-04-03 DIAGNOSIS — R32 Unspecified urinary incontinence: Secondary | ICD-10-CM | POA: Diagnosis not present

## 2020-04-03 DIAGNOSIS — G8929 Other chronic pain: Secondary | ICD-10-CM | POA: Diagnosis not present

## 2020-04-03 DIAGNOSIS — I129 Hypertensive chronic kidney disease with stage 1 through stage 4 chronic kidney disease, or unspecified chronic kidney disease: Secondary | ICD-10-CM | POA: Diagnosis not present

## 2020-04-03 DIAGNOSIS — I4891 Unspecified atrial fibrillation: Secondary | ICD-10-CM | POA: Diagnosis not present

## 2020-04-04 ENCOUNTER — Telehealth: Payer: Self-pay

## 2020-04-04 DIAGNOSIS — E559 Vitamin D deficiency, unspecified: Secondary | ICD-10-CM | POA: Diagnosis not present

## 2020-04-04 DIAGNOSIS — M810 Age-related osteoporosis without current pathological fracture: Secondary | ICD-10-CM | POA: Diagnosis not present

## 2020-04-04 DIAGNOSIS — D803 Selective deficiency of immunoglobulin G [IgG] subclasses: Secondary | ICD-10-CM | POA: Diagnosis not present

## 2020-04-04 DIAGNOSIS — M48061 Spinal stenosis, lumbar region without neurogenic claudication: Secondary | ICD-10-CM | POA: Diagnosis not present

## 2020-04-04 DIAGNOSIS — D638 Anemia in other chronic diseases classified elsewhere: Secondary | ICD-10-CM | POA: Diagnosis not present

## 2020-04-04 DIAGNOSIS — D500 Iron deficiency anemia secondary to blood loss (chronic): Secondary | ICD-10-CM | POA: Diagnosis not present

## 2020-04-04 DIAGNOSIS — I129 Hypertensive chronic kidney disease with stage 1 through stage 4 chronic kidney disease, or unspecified chronic kidney disease: Secondary | ICD-10-CM | POA: Diagnosis not present

## 2020-04-04 DIAGNOSIS — Z7901 Long term (current) use of anticoagulants: Secondary | ICD-10-CM | POA: Diagnosis not present

## 2020-04-04 DIAGNOSIS — D46C Myelodysplastic syndrome with isolated del(5q) chromosomal abnormality: Secondary | ICD-10-CM | POA: Diagnosis not present

## 2020-04-04 DIAGNOSIS — N184 Chronic kidney disease, stage 4 (severe): Secondary | ICD-10-CM | POA: Diagnosis not present

## 2020-04-04 DIAGNOSIS — F3341 Major depressive disorder, recurrent, in partial remission: Secondary | ICD-10-CM | POA: Diagnosis not present

## 2020-04-04 DIAGNOSIS — I480 Paroxysmal atrial fibrillation: Secondary | ICD-10-CM | POA: Diagnosis not present

## 2020-04-04 NOTE — Telephone Encounter (Signed)
[facility] called wanting verbal orders to continue at home care for patient, unable to speak to [facility] when call was returned unable to leave voicemail.

## 2020-04-05 DIAGNOSIS — N3000 Acute cystitis without hematuria: Secondary | ICD-10-CM | POA: Diagnosis not present

## 2020-04-05 DIAGNOSIS — Z91018 Allergy to other foods: Secondary | ICD-10-CM | POA: Diagnosis not present

## 2020-04-05 DIAGNOSIS — Z888 Allergy status to other drugs, medicaments and biological substances status: Secondary | ICD-10-CM | POA: Diagnosis not present

## 2020-04-05 DIAGNOSIS — Z91040 Latex allergy status: Secondary | ICD-10-CM | POA: Diagnosis not present

## 2020-04-05 DIAGNOSIS — K219 Gastro-esophageal reflux disease without esophagitis: Secondary | ICD-10-CM | POA: Diagnosis not present

## 2020-04-05 DIAGNOSIS — R600 Localized edema: Secondary | ICD-10-CM | POA: Diagnosis not present

## 2020-04-05 DIAGNOSIS — Z86711 Personal history of pulmonary embolism: Secondary | ICD-10-CM | POA: Diagnosis not present

## 2020-04-05 DIAGNOSIS — I509 Heart failure, unspecified: Secondary | ICD-10-CM | POA: Diagnosis not present

## 2020-04-05 DIAGNOSIS — I4891 Unspecified atrial fibrillation: Secondary | ICD-10-CM | POA: Diagnosis not present

## 2020-04-05 DIAGNOSIS — I10 Essential (primary) hypertension: Secondary | ICD-10-CM | POA: Diagnosis not present

## 2020-04-07 DIAGNOSIS — H3530 Unspecified macular degeneration: Secondary | ICD-10-CM | POA: Diagnosis not present

## 2020-04-07 DIAGNOSIS — I129 Hypertensive chronic kidney disease with stage 1 through stage 4 chronic kidney disease, or unspecified chronic kidney disease: Secondary | ICD-10-CM | POA: Diagnosis not present

## 2020-04-07 DIAGNOSIS — G8929 Other chronic pain: Secondary | ICD-10-CM | POA: Diagnosis not present

## 2020-04-07 DIAGNOSIS — R32 Unspecified urinary incontinence: Secondary | ICD-10-CM | POA: Diagnosis not present

## 2020-04-07 DIAGNOSIS — N189 Chronic kidney disease, unspecified: Secondary | ICD-10-CM | POA: Diagnosis not present

## 2020-04-07 DIAGNOSIS — Z7901 Long term (current) use of anticoagulants: Secondary | ICD-10-CM | POA: Diagnosis not present

## 2020-04-07 DIAGNOSIS — M1990 Unspecified osteoarthritis, unspecified site: Secondary | ICD-10-CM | POA: Diagnosis not present

## 2020-04-07 DIAGNOSIS — I4891 Unspecified atrial fibrillation: Secondary | ICD-10-CM | POA: Diagnosis not present

## 2020-04-10 ENCOUNTER — Other Ambulatory Visit: Payer: Self-pay

## 2020-04-10 ENCOUNTER — Ambulatory Visit (INDEPENDENT_AMBULATORY_CARE_PROVIDER_SITE_OTHER): Payer: Medicare HMO | Admitting: Medical-Surgical

## 2020-04-10 ENCOUNTER — Encounter: Payer: Self-pay | Admitting: Medical-Surgical

## 2020-04-10 VITALS — BP 152/85 | HR 57 | Temp 97.9°F | Ht 63.0 in | Wt 111.2 lb

## 2020-04-10 DIAGNOSIS — R300 Dysuria: Secondary | ICD-10-CM | POA: Diagnosis not present

## 2020-04-10 LAB — POCT URINALYSIS DIP (CLINITEK)
Bilirubin, UA: NEGATIVE
Glucose, UA: NEGATIVE mg/dL
Ketones, POC UA: NEGATIVE mg/dL
Nitrite, UA: POSITIVE — AB
POC PROTEIN,UA: >=300 — AB
Spec Grav, UA: 1.025 (ref 1.010–1.025)
Urobilinogen, UA: 0.2 E.U./dL
pH, UA: 6 (ref 5.0–8.0)

## 2020-04-10 MED ORDER — NITROFURANTOIN MONOHYD MACRO 100 MG PO CAPS: 100.0000 mg | ORAL_CAPSULE | Freq: Two times a day (BID) | ORAL | 0 refills | Status: DC

## 2020-04-10 MED ORDER — FLUCONAZOLE 150 MG PO TABS
150.0000 mg | ORAL_TABLET | Freq: Once | ORAL | 0 refills | Status: AC
Start: 2020-04-10 — End: 2020-04-10

## 2020-04-10 NOTE — Progress Notes (Signed)
Subjective:    CC: Hospital follow-up for UTI  HPI: Pleasant 79 year old female accompanied by her daughter today presenting for hospital follow-up for UTI.  She was seen at [facility] emergency room on 04/05/2020 for confusion and hallucinations.  At that time she was found to have a urinary tract infection which grew out Raoultella planticola susceptible to all but ampicillin.  She was treated with a one-time dose of fosfomycin in the emergency room which she tolerated well.  Her urine has not cleared but she notes that the hallucination, confusions and left leg swelling that were there have all gotten much better.  Today she is here for a urine recheck.  Denies fever, continued confusion/hallucinations, abdominal pain.  Baseline urinary urgency and incontinence.  Patient reports that she has been having frequent chills.  I reviewed the past medical history, family history, social history, surgical history, and allergies today and no changes were needed.  Please see the problem list section below in epic for further details.  Past Medical History: Past Medical History:  Diagnosis Date  . Arthritis   . Cancer ([place])   . Cataract    bilaterally  . Hypertension   . Myelodysplasia (myelodysplastic syndrome) ([place])   . Myelodysplastic syndrome [facility])    Past Surgical History: Past Surgical History:  Procedure Laterality Date  . JOINT REPLACEMENT    . STOMACH SURGERY     Social History: Social History   Socioeconomic History  . Marital status: Widowed    Spouse name: Not on file  . Number of children: 4  . Years of education: 61  . Highest education level: 12th grade  Occupational History  . 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 Smoking status: Never Smoker  . Smokeless tobacco: Never Used  Vaping Use  . Vaping Use: Never used  Substance and Sexual Activity  . Alcohol use: No  . Drug use: No  . Sexual activity: Not Currently  Other Topics  Concern  . Not on file  Social History Narrative   Patient is going through a lot of health issues right now ? bone marrow biopsy   Social Determinants of Health   Financial Resource Strain:   . Difficulty of Paying Living Expenses:   Food Insecurity:   . Worried About Charity fundraiser in the Last Year:   . Arboriculturist in the Last Year:   Transportation Needs:   . Film/video editor (Medical):   Marland Kitchen Lack of Transportation (Non-Medical):   Physical Activity:   . Days of Exercise per Week:   . Minutes of Exercise per Session:   Stress:   . Feeling of Stress :   Social Connections: Unknown  . Frequency of Communication with Friends and Family: More than three times a week  . Frequency of Social Gatherings with Friends and Family: Not on file  . Attends Religious Services: More than 4 times per year  . Active Member of Clubs or Organizations: Not on file  . Attends Archivist Meetings: Not on file  . Marital Status: Not on file   Family History: Family History  Problem Relation Age of Onset  . Hypertension Mother   . Pulmonary embolism Mother   . Heart disease Father    Allergies: Allergies  Allergen Reactions  . Avocado Shortness Of Breath  . Cefuroxime Anaphylaxis    Difficulty swallowing pills because they were too dry.  Caused tablet dysphagia.  Marland Kitchen  Diazepam Shortness Of Breath    HYPERVENTILATION     . Diphenhydramine Palpitations    Affected heart rate/er told her not to take again  . Latex Rash and Shortness Of Breath    Airway swelling  . Other Palpitations    Affected heart rate/er told her not to take again  . Amlodipine Cough and Other (See Comments)    Not sure  . Butorphanol Hives  . Diclofenac Hives  . Diclofenac Sodium Hives  . Diphenhydramine Hcl Palpitations    Affected heart rate/er told her not to take again  . Methylpyrrolidone Hives  . Ondansetron Hives and Other (See Comments)    Sedates/knocks her out    . Oxycodone  Hives  . Penicillins Hives and Swelling    Most mycin drugs  . Quinolones Other (See Comments)    Joint pain  . Sulfa Antibiotics Hives and Swelling  . Tomato Other (See Comments)    Broke out inside of mouth  . Butorphanol Tartrate Hives  . Lisinopril Cough    Caused pt to cough  . Nexium [Esomeprazole Magnesium] Diarrhea and Nausea And Vomiting  . Ramipril Cough    Pt c/o cough  . Ace Inhibitors Other (See Comments) and Cough    Lisinopril and ramipril   . Ciprofloxacin Rash    Patient prefers not to take Fluoroquinolones   . Codeine Nausea Only  . Pantoprazole Sodium Diarrhea   Medications: See med rec.  Review of Systems: See HPI for pertinent positives and negatives.   Objective:    General: Well Developed, well nourished, and in no acute distress.  Neuro: Alert and oriented x3.  HEENT: Normocephalic, atraumatic.  Skin: Warm and dry. Cardiac: Regular rate and rhythm, no murmurs rubs or gallops, no lower extremity edema.  Respiratory: Clear to auscultation bilaterally. Not using accessory muscles, speaking in full sentences. Abdomen: Soft, nontender, nondistended.  Impression and Recommendations:    1. Dysuria POCT urinalysis positive for large blood, nitrites, and large leukocytes.  Protein greater than 300 mg/dL.  Negative for ketones, bilirubin, and glucose.  Sending for culture.  With her many allergies, with our antibiotic choices are limited.  Sending in nitrofurantoin twice daily for 5 days while waiting on culture results. - POCT URINALYSIS DIP (CLINITEK) - Urine Culture  Return if symptoms worsen or fail to improve. ___________________________________________  L. , DNP, APRN, FNP-BC Primary Care and Sports Medicine [facility]  

## 2020-04-11 ENCOUNTER — Telehealth: Payer: Self-pay

## 2020-04-11 DIAGNOSIS — H3530 Unspecified macular degeneration: Secondary | ICD-10-CM | POA: Diagnosis not present

## 2020-04-11 DIAGNOSIS — Z7901 Long term (current) use of anticoagulants: Secondary | ICD-10-CM | POA: Diagnosis not present

## 2020-04-11 DIAGNOSIS — I10 Essential (primary) hypertension: Secondary | ICD-10-CM | POA: Diagnosis not present

## 2020-04-11 DIAGNOSIS — M1990 Unspecified osteoarthritis, unspecified site: Secondary | ICD-10-CM | POA: Diagnosis not present

## 2020-04-11 DIAGNOSIS — N184 Chronic kidney disease, stage 4 (severe): Secondary | ICD-10-CM | POA: Diagnosis not present

## 2020-04-11 DIAGNOSIS — R197 Diarrhea, unspecified: Secondary | ICD-10-CM | POA: Diagnosis not present

## 2020-04-11 DIAGNOSIS — I4891 Unspecified atrial fibrillation: Secondary | ICD-10-CM | POA: Diagnosis not present

## 2020-04-11 DIAGNOSIS — I129 Hypertensive chronic kidney disease with stage 1 through stage 4 chronic kidney disease, or unspecified chronic kidney disease: Secondary | ICD-10-CM | POA: Diagnosis not present

## 2020-04-11 DIAGNOSIS — N189 Chronic kidney disease, unspecified: Secondary | ICD-10-CM | POA: Diagnosis not present

## 2020-04-11 DIAGNOSIS — D508 Other iron deficiency anemias: Secondary | ICD-10-CM | POA: Diagnosis not present

## 2020-04-11 DIAGNOSIS — G8929 Other chronic pain: Secondary | ICD-10-CM | POA: Diagnosis not present

## 2020-04-11 DIAGNOSIS — R32 Unspecified urinary incontinence: Secondary | ICD-10-CM | POA: Diagnosis not present

## 2020-04-11 DIAGNOSIS — K911 Postgastric surgery syndromes: Secondary | ICD-10-CM | POA: Diagnosis not present

## 2020-04-11 NOTE — Telephone Encounter (Signed)
Hold doses for today and tomorrow.  Plan for 5 mg on Sunday with recheck on Monday.  New warfarin administration instructions: 4 mg daily on Mondays, Wednesdays, and Fridays.  5 mg daily on all other days.  If she needs 4 mg tablets, I will be glad to send these into the pharmacy.

## 2020-04-11 NOTE — Telephone Encounter (Signed)
Gloria Mckay, nurse with [facility], called to report an INR of 6.4 today.   Sending note to [name] since she was seen by her yesterday and started on an antibiotic.   Please advise

## 2020-04-11 NOTE — Telephone Encounter (Signed)
Unfortunately, with her multiple "allergies", there is nothing else that I can call in unless she is willing to take Ciprofloxacin. It is on her allergy list as causing a rash and patient would rather not take. Note that this is not considered an allergy, merely a side effect unless it was Hives/Anaphylaxis. If she is not willing to take Cipro, they will need to contact her Infectious Disease specialist regarding treatment for this. Her urine culture is pending but we can send them her UA from yesterday and her previous urine culture results.  If she does not want to take Cipro, please print urinalysis results with office visit note and fax to [facility] in [place].

## 2020-04-11 NOTE — Telephone Encounter (Signed)
[name], pt's daughter, called stating that her mother started the Macrobid last night and this morning she told her daughter she felt like she was fighting a fever all night and she got to the point where she couldn't move. She said the pt told her she will not take any more of the atbx. Pt has a lot of atbx allergies and they do not know what is out there that she can take, but is requesting something else to be called in for her Mom.

## 2020-04-11 NOTE — Telephone Encounter (Signed)
Pt's daughter aware of [name]'s response and recommendations. Nicola Girt states that after our initial conversation that her mother called her back and told her that she felt fine. She asked if the pt should take another Macrobid and see how she does. I told her that what [name]'s recommendation for right now it to contact ID specialist or to go to the ED. She stated that her mother had a doctors appt this morning, she did not say who with, but she would see what they say. No further questions or concerns at this time.

## 2020-04-12 ENCOUNTER — Other Ambulatory Visit: Payer: Self-pay | Admitting: Osteopathic Medicine

## 2020-04-13 LAB — URINE CULTURE
MICRO NUMBER:: 10820537
SPECIMEN QUALITY:: ADEQUATE

## 2020-04-14 DIAGNOSIS — Z7901 Long term (current) use of anticoagulants: Secondary | ICD-10-CM | POA: Diagnosis not present

## 2020-04-14 DIAGNOSIS — N390 Urinary tract infection, site not specified: Secondary | ICD-10-CM | POA: Diagnosis not present

## 2020-04-14 DIAGNOSIS — N184 Chronic kidney disease, stage 4 (severe): Secondary | ICD-10-CM | POA: Diagnosis not present

## 2020-04-14 DIAGNOSIS — I129 Hypertensive chronic kidney disease with stage 1 through stage 4 chronic kidney disease, or unspecified chronic kidney disease: Secondary | ICD-10-CM | POA: Diagnosis not present

## 2020-04-14 DIAGNOSIS — N189 Chronic kidney disease, unspecified: Secondary | ICD-10-CM | POA: Diagnosis not present

## 2020-04-14 DIAGNOSIS — R32 Unspecified urinary incontinence: Secondary | ICD-10-CM | POA: Diagnosis not present

## 2020-04-14 DIAGNOSIS — M1990 Unspecified osteoarthritis, unspecified site: Secondary | ICD-10-CM | POA: Diagnosis not present

## 2020-04-14 DIAGNOSIS — E559 Vitamin D deficiency, unspecified: Secondary | ICD-10-CM | POA: Diagnosis not present

## 2020-04-14 DIAGNOSIS — I4891 Unspecified atrial fibrillation: Secondary | ICD-10-CM | POA: Diagnosis not present

## 2020-04-14 DIAGNOSIS — D472 Monoclonal gammopathy: Secondary | ICD-10-CM | POA: Diagnosis not present

## 2020-04-14 DIAGNOSIS — G8929 Other chronic pain: Secondary | ICD-10-CM | POA: Diagnosis not present

## 2020-04-14 DIAGNOSIS — I10 Essential (primary) hypertension: Secondary | ICD-10-CM | POA: Diagnosis not present

## 2020-04-14 DIAGNOSIS — H3530 Unspecified macular degeneration: Secondary | ICD-10-CM | POA: Diagnosis not present

## 2020-04-14 MED ORDER — WARFARIN SODIUM 5 MG PO TABS: ORAL_TABLET | ORAL | 1 refills | Status: DC

## 2020-04-14 MED ORDER — WARFARIN SODIUM 4 MG PO TABS
ORAL_TABLET | ORAL | 0 refills | Status: DC
Start: 2020-04-14 — End: 2020-05-12

## 2020-04-14 NOTE — Telephone Encounter (Signed)
Gloria Mckay called back, INR today was 1.8  Patient held dose on Friday and Saturday then took 5 mg on Sunday

## 2020-04-14 NOTE — Telephone Encounter (Signed)
Continue plan for 4mg  daily on Mondays, Wednesdays, and Fridays. 5mg  daily on Sundays, Tuesdays, Thursdays, and Saturdays. Recheck INR on Friday.

## 2020-04-14 NOTE — Telephone Encounter (Signed)
Left msg for call back from [place]- non ID'd VM

## 2020-04-14 NOTE — Telephone Encounter (Signed)
This was verbally discussed with Gloria Mckay on Friday and home health was made aware of dosing and recommendations on Friday 04/11/20

## 2020-04-14 NOTE — Telephone Encounter (Signed)
Gloria Mckay advised, will let Gloria Mckay know about new dose. Requested 4 mg tabs be sent to the pharmacy, this has been completed.

## 2020-04-15 DIAGNOSIS — N184 Chronic kidney disease, stage 4 (severe): Secondary | ICD-10-CM | POA: Diagnosis not present

## 2020-04-15 DIAGNOSIS — D500 Iron deficiency anemia secondary to blood loss (chronic): Secondary | ICD-10-CM | POA: Diagnosis not present

## 2020-04-15 DIAGNOSIS — D508 Other iron deficiency anemias: Secondary | ICD-10-CM | POA: Diagnosis not present

## 2020-04-15 DIAGNOSIS — K219 Gastro-esophageal reflux disease without esophagitis: Secondary | ICD-10-CM | POA: Diagnosis not present

## 2020-04-15 DIAGNOSIS — R197 Diarrhea, unspecified: Secondary | ICD-10-CM | POA: Diagnosis not present

## 2020-04-15 DIAGNOSIS — K911 Postgastric surgery syndromes: Secondary | ICD-10-CM | POA: Diagnosis not present

## 2020-04-18 ENCOUNTER — Telehealth (INDEPENDENT_AMBULATORY_CARE_PROVIDER_SITE_OTHER): Payer: Medicare HMO

## 2020-04-18 ENCOUNTER — Telehealth: Payer: Self-pay

## 2020-04-18 DIAGNOSIS — G8929 Other chronic pain: Secondary | ICD-10-CM | POA: Diagnosis not present

## 2020-04-18 DIAGNOSIS — E870 Hyperosmolality and hypernatremia: Secondary | ICD-10-CM

## 2020-04-18 DIAGNOSIS — R300 Dysuria: Secondary | ICD-10-CM | POA: Diagnosis not present

## 2020-04-18 DIAGNOSIS — H3530 Unspecified macular degeneration: Secondary | ICD-10-CM | POA: Diagnosis not present

## 2020-04-18 DIAGNOSIS — I4891 Unspecified atrial fibrillation: Secondary | ICD-10-CM | POA: Diagnosis not present

## 2020-04-18 DIAGNOSIS — R32 Unspecified urinary incontinence: Secondary | ICD-10-CM | POA: Diagnosis not present

## 2020-04-18 DIAGNOSIS — N189 Chronic kidney disease, unspecified: Secondary | ICD-10-CM | POA: Diagnosis not present

## 2020-04-18 DIAGNOSIS — I129 Hypertensive chronic kidney disease with stage 1 through stage 4 chronic kidney disease, or unspecified chronic kidney disease: Secondary | ICD-10-CM | POA: Diagnosis not present

## 2020-04-18 DIAGNOSIS — Z7901 Long term (current) use of anticoagulants: Secondary | ICD-10-CM | POA: Diagnosis not present

## 2020-04-18 DIAGNOSIS — M1990 Unspecified osteoarthritis, unspecified site: Secondary | ICD-10-CM | POA: Diagnosis not present

## 2020-04-18 NOTE — Telephone Encounter (Signed)
INR at goal Continue meds Recheck 2 weeks given hx labile INR readings

## 2020-04-18 NOTE — Telephone Encounter (Signed)
Task completed. Tabitha from [facility] updated of provider's note. She will contact the patient regarding no change in current med regimen.

## 2020-04-18 NOTE — Telephone Encounter (Signed)
Received call from [place] with [facility]  INR today was 2.1  Patient taking 4 mg Monday, Wednesday, Friday and 5 mg on Sunday, Tuesday, Thursday, and Saturdays  Tabitha's CB [phone]   This is in range, discussed with Dr Sheppard Coil, ok to send to her to be addressed Monday

## 2020-04-18 NOTE — Telephone Encounter (Signed)
Only 5 days of Nitrofurantoin prescribed for most recent UTI on 8/12. Patient should have already completed the antibiotic if taking it as prescribed. Did someone else prescribe a longer course? I do not mind ordering the UA and culture for her to drop off. Please make sure she gets the sample to the lab within the appropriate time frame.

## 2020-04-18 NOTE — Telephone Encounter (Signed)
Noted  

## 2020-04-18 NOTE — Telephone Encounter (Signed)
Pt's daughter [name] called and said that the pt went to see her kidney doctor yesterday as already planned. She said he told her to continue with the atbx and then have a UA and UCx done once completed and that he recommends her having it done through our office. Per discussion about UCx results, it was already stated that she would come in for an OV at [facility] request to make sure the infection had resolved since she was asymptomatic. [name] states that she will be finishing the atbx early next week but her schedule is so full that she will not be able to bring her in for an appt, but wants to know if she can come and get a cup, collect the specimen, and then drop it off for UA and UCx. Please advise.

## 2020-04-18 NOTE — Telephone Encounter (Signed)
Spoke with [name] who said that she competed the atbx 2 days ago. Informed her that she can come by and pick up a cup to take for urine collection, but the specimen will have to be in the office within 1 hour of collection. Orders for UA and UCx entered.

## 2020-04-21 LAB — POCT URINALYSIS DIP (CLINITEK)
Bilirubin, UA: NEGATIVE
Blood, UA: NEGATIVE
Glucose, UA: NEGATIVE mg/dL
Ketones, POC UA: NEGATIVE mg/dL
Nitrite, UA: NEGATIVE
POC PROTEIN,UA: NEGATIVE
Spec Grav, UA: 1.02 (ref 1.010–1.025)
Urobilinogen, UA: 0.2 E.U./dL
pH, UA: 6 (ref 5.0–8.0)

## 2020-04-23 ENCOUNTER — Other Ambulatory Visit: Payer: Self-pay | Admitting: Sports Medicine

## 2020-04-23 DIAGNOSIS — M48061 Spinal stenosis, lumbar region without neurogenic claudication: Secondary | ICD-10-CM

## 2020-04-23 NOTE — Telephone Encounter (Signed)
Last written 03/21/2020 #60 with no refill Last appt 04/01/2020

## 2020-04-24 LAB — URINE CULTURE
MICRO NUMBER:: 10861137
SPECIMEN QUALITY:: ADEQUATE

## 2020-04-25 DIAGNOSIS — M1990 Unspecified osteoarthritis, unspecified site: Secondary | ICD-10-CM | POA: Diagnosis not present

## 2020-04-25 DIAGNOSIS — H3530 Unspecified macular degeneration: Secondary | ICD-10-CM | POA: Diagnosis not present

## 2020-04-25 DIAGNOSIS — N189 Chronic kidney disease, unspecified: Secondary | ICD-10-CM | POA: Diagnosis not present

## 2020-04-25 DIAGNOSIS — R32 Unspecified urinary incontinence: Secondary | ICD-10-CM | POA: Diagnosis not present

## 2020-04-25 DIAGNOSIS — I129 Hypertensive chronic kidney disease with stage 1 through stage 4 chronic kidney disease, or unspecified chronic kidney disease: Secondary | ICD-10-CM | POA: Diagnosis not present

## 2020-04-25 DIAGNOSIS — G8929 Other chronic pain: Secondary | ICD-10-CM | POA: Diagnosis not present

## 2020-04-25 DIAGNOSIS — Z7901 Long term (current) use of anticoagulants: Secondary | ICD-10-CM | POA: Diagnosis not present

## 2020-04-25 DIAGNOSIS — I4891 Unspecified atrial fibrillation: Secondary | ICD-10-CM | POA: Diagnosis not present

## 2020-04-28 ENCOUNTER — Telehealth: Payer: Self-pay

## 2020-04-28 NOTE — Telephone Encounter (Signed)
Brandy Hale of Speech Pathology called requesting verbal orders for a new evaluation. The recertification date has already passed. New eval is required for continuity of care.

## 2020-04-28 NOTE — Telephone Encounter (Signed)
Rosi called and left a message. Camaryn would like to continue with Home Health. I did find a Home Health number in the messages. I called and asked for a return call.    Tabitha's with [facility] [phone]

## 2020-04-29 DIAGNOSIS — I129 Hypertensive chronic kidney disease with stage 1 through stage 4 chronic kidney disease, or unspecified chronic kidney disease: Secondary | ICD-10-CM | POA: Diagnosis not present

## 2020-04-29 DIAGNOSIS — I4891 Unspecified atrial fibrillation: Secondary | ICD-10-CM | POA: Diagnosis not present

## 2020-04-29 DIAGNOSIS — R32 Unspecified urinary incontinence: Secondary | ICD-10-CM | POA: Diagnosis not present

## 2020-04-29 DIAGNOSIS — N189 Chronic kidney disease, unspecified: Secondary | ICD-10-CM | POA: Diagnosis not present

## 2020-04-29 DIAGNOSIS — H3530 Unspecified macular degeneration: Secondary | ICD-10-CM | POA: Diagnosis not present

## 2020-04-29 DIAGNOSIS — G8929 Other chronic pain: Secondary | ICD-10-CM | POA: Diagnosis not present

## 2020-04-29 DIAGNOSIS — Z7901 Long term (current) use of anticoagulants: Secondary | ICD-10-CM | POA: Diagnosis not present

## 2020-04-29 DIAGNOSIS — M1990 Unspecified osteoarthritis, unspecified site: Secondary | ICD-10-CM | POA: Diagnosis not present

## 2020-04-29 NOTE — Telephone Encounter (Signed)
Task completed, Home health called left message on voicemail giving verbal orders.

## 2020-04-29 NOTE — Telephone Encounter (Signed)
OK to give verbal order they can also fax something for me to sign

## 2020-05-02 ENCOUNTER — Telehealth: Payer: Self-pay

## 2020-05-02 ENCOUNTER — Other Ambulatory Visit: Payer: Self-pay | Admitting: Sports Medicine

## 2020-05-02 DIAGNOSIS — Z1231 Encounter for screening mammogram for malignant neoplasm of breast: Secondary | ICD-10-CM | POA: Diagnosis not present

## 2020-05-02 DIAGNOSIS — R109 Unspecified abdominal pain: Secondary | ICD-10-CM

## 2020-05-02 NOTE — Telephone Encounter (Signed)
To PCP

## 2020-05-02 NOTE — Telephone Encounter (Signed)
Sharyn Lull with [facility] went to the patient's home to complete INR check. Patient has not answered the phone or door. Sharyn Lull called all emergency contacts but was unsuccessful. The patient next INR check is scheduled for 05/07/20.

## 2020-05-07 DIAGNOSIS — G8929 Other chronic pain: Secondary | ICD-10-CM | POA: Diagnosis not present

## 2020-05-07 DIAGNOSIS — N189 Chronic kidney disease, unspecified: Secondary | ICD-10-CM | POA: Diagnosis not present

## 2020-05-07 DIAGNOSIS — Z7901 Long term (current) use of anticoagulants: Secondary | ICD-10-CM | POA: Diagnosis not present

## 2020-05-07 DIAGNOSIS — I129 Hypertensive chronic kidney disease with stage 1 through stage 4 chronic kidney disease, or unspecified chronic kidney disease: Secondary | ICD-10-CM | POA: Diagnosis not present

## 2020-05-07 DIAGNOSIS — I4891 Unspecified atrial fibrillation: Secondary | ICD-10-CM | POA: Diagnosis not present

## 2020-05-07 DIAGNOSIS — M1990 Unspecified osteoarthritis, unspecified site: Secondary | ICD-10-CM | POA: Diagnosis not present

## 2020-05-07 DIAGNOSIS — R32 Unspecified urinary incontinence: Secondary | ICD-10-CM | POA: Diagnosis not present

## 2020-05-07 DIAGNOSIS — H3530 Unspecified macular degeneration: Secondary | ICD-10-CM | POA: Diagnosis not present

## 2020-05-08 DIAGNOSIS — H3530 Unspecified macular degeneration: Secondary | ICD-10-CM | POA: Diagnosis not present

## 2020-05-08 DIAGNOSIS — R32 Unspecified urinary incontinence: Secondary | ICD-10-CM | POA: Diagnosis not present

## 2020-05-08 DIAGNOSIS — I129 Hypertensive chronic kidney disease with stage 1 through stage 4 chronic kidney disease, or unspecified chronic kidney disease: Secondary | ICD-10-CM | POA: Diagnosis not present

## 2020-05-08 DIAGNOSIS — Z7901 Long term (current) use of anticoagulants: Secondary | ICD-10-CM | POA: Diagnosis not present

## 2020-05-08 DIAGNOSIS — M1990 Unspecified osteoarthritis, unspecified site: Secondary | ICD-10-CM | POA: Diagnosis not present

## 2020-05-08 DIAGNOSIS — N189 Chronic kidney disease, unspecified: Secondary | ICD-10-CM | POA: Diagnosis not present

## 2020-05-08 DIAGNOSIS — G8929 Other chronic pain: Secondary | ICD-10-CM | POA: Diagnosis not present

## 2020-05-08 DIAGNOSIS — I4891 Unspecified atrial fibrillation: Secondary | ICD-10-CM | POA: Diagnosis not present

## 2020-05-09 ENCOUNTER — Telehealth: Payer: Self-pay

## 2020-05-09 DIAGNOSIS — H3552 Pigmentary retinal dystrophy: Secondary | ICD-10-CM | POA: Diagnosis not present

## 2020-05-09 DIAGNOSIS — H353132 Nonexudative age-related macular degeneration, bilateral, intermediate dry stage: Secondary | ICD-10-CM | POA: Diagnosis not present

## 2020-05-09 DIAGNOSIS — H04123 Dry eye syndrome of bilateral lacrimal glands: Secondary | ICD-10-CM | POA: Diagnosis not present

## 2020-05-09 NOTE — Telephone Encounter (Signed)
Received call from [place] with [facility].   Patient's INR was 3.2 Patient reported to [place] that she had been drinking some cranberry juice and her diet has been slightly altered due to having diarrhea this week. She has been taking Imodium  Per chart, patient's dose is 4 mg on M,W,F and 5 mg on Sun, Tues, Thurs, and Saturday I attempted to call Tabitha and confirm patient is still on this dose but she did not answer and her VM was full, unable to leave msg   Note to covering provider  Tabitha's CB # [phone]

## 2020-05-09 NOTE — Telephone Encounter (Signed)
Since she has been eating differently and drinking cranberry juice this week, this could cause elevated INR. Recommend limiting cranberry juice as it has interaction with warfarin and following her normal diet as closely as possible. Continue current regimen and recheck INR in one week.

## 2020-05-12 ENCOUNTER — Other Ambulatory Visit: Payer: Self-pay | Admitting: Medical-Surgical

## 2020-05-12 NOTE — Telephone Encounter (Signed)
Tabitha advised

## 2020-05-13 DIAGNOSIS — N189 Chronic kidney disease, unspecified: Secondary | ICD-10-CM | POA: Diagnosis not present

## 2020-05-13 DIAGNOSIS — G8929 Other chronic pain: Secondary | ICD-10-CM | POA: Diagnosis not present

## 2020-05-13 DIAGNOSIS — Z7901 Long term (current) use of anticoagulants: Secondary | ICD-10-CM | POA: Diagnosis not present

## 2020-05-13 DIAGNOSIS — H3530 Unspecified macular degeneration: Secondary | ICD-10-CM | POA: Diagnosis not present

## 2020-05-13 DIAGNOSIS — I129 Hypertensive chronic kidney disease with stage 1 through stage 4 chronic kidney disease, or unspecified chronic kidney disease: Secondary | ICD-10-CM | POA: Diagnosis not present

## 2020-05-13 DIAGNOSIS — I4891 Unspecified atrial fibrillation: Secondary | ICD-10-CM | POA: Diagnosis not present

## 2020-05-13 DIAGNOSIS — R32 Unspecified urinary incontinence: Secondary | ICD-10-CM | POA: Diagnosis not present

## 2020-05-13 DIAGNOSIS — M1990 Unspecified osteoarthritis, unspecified site: Secondary | ICD-10-CM | POA: Diagnosis not present

## 2020-05-14 ENCOUNTER — Telehealth: Payer: Self-pay | Admitting: Osteopathic Medicine

## 2020-05-14 NOTE — Telephone Encounter (Signed)
Pt daughter is wondering how we can get Jamilah an order for a new walker. She described the one they are trying to get as "one that folds and has 4 legs and but no seat". The one Tamey currently has is getting old and not as helpful with the seat, they're worried about the stability of the old one. She wants to know if there is any way one can be ordered and the insurance pay for it.

## 2020-05-15 DIAGNOSIS — I129 Hypertensive chronic kidney disease with stage 1 through stage 4 chronic kidney disease, or unspecified chronic kidney disease: Secondary | ICD-10-CM | POA: Diagnosis not present

## 2020-05-15 DIAGNOSIS — G8929 Other chronic pain: Secondary | ICD-10-CM | POA: Diagnosis not present

## 2020-05-15 DIAGNOSIS — R32 Unspecified urinary incontinence: Secondary | ICD-10-CM | POA: Diagnosis not present

## 2020-05-15 DIAGNOSIS — I4891 Unspecified atrial fibrillation: Secondary | ICD-10-CM | POA: Diagnosis not present

## 2020-05-15 DIAGNOSIS — N189 Chronic kidney disease, unspecified: Secondary | ICD-10-CM | POA: Diagnosis not present

## 2020-05-15 DIAGNOSIS — Z7901 Long term (current) use of anticoagulants: Secondary | ICD-10-CM | POA: Diagnosis not present

## 2020-05-15 DIAGNOSIS — M1990 Unspecified osteoarthritis, unspecified site: Secondary | ICD-10-CM | POA: Diagnosis not present

## 2020-05-15 DIAGNOSIS — H3530 Unspecified macular degeneration: Secondary | ICD-10-CM | POA: Diagnosis not present

## 2020-05-15 MED ORDER — AMBULATORY NON FORMULARY MEDICATION: 99 refills | Status: AC

## 2020-05-15 NOTE — Telephone Encounter (Signed)
I wrote Rx they can pick it up and take it to medical supply store or I can fax it to the store of their choosing

## 2020-05-16 ENCOUNTER — Telehealth: Payer: Self-pay

## 2020-05-16 DIAGNOSIS — Z7901 Long term (current) use of anticoagulants: Secondary | ICD-10-CM | POA: Diagnosis not present

## 2020-05-16 DIAGNOSIS — M1990 Unspecified osteoarthritis, unspecified site: Secondary | ICD-10-CM | POA: Diagnosis not present

## 2020-05-16 DIAGNOSIS — R32 Unspecified urinary incontinence: Secondary | ICD-10-CM | POA: Diagnosis not present

## 2020-05-16 DIAGNOSIS — N189 Chronic kidney disease, unspecified: Secondary | ICD-10-CM | POA: Diagnosis not present

## 2020-05-16 DIAGNOSIS — I129 Hypertensive chronic kidney disease with stage 1 through stage 4 chronic kidney disease, or unspecified chronic kidney disease: Secondary | ICD-10-CM | POA: Diagnosis not present

## 2020-05-16 DIAGNOSIS — G8929 Other chronic pain: Secondary | ICD-10-CM | POA: Diagnosis not present

## 2020-05-16 DIAGNOSIS — H3530 Unspecified macular degeneration: Secondary | ICD-10-CM | POA: Diagnosis not present

## 2020-05-16 DIAGNOSIS — I4891 Unspecified atrial fibrillation: Secondary | ICD-10-CM | POA: Diagnosis not present

## 2020-05-16 NOTE — Telephone Encounter (Addendum)
Received a call fromTabitha with [facility].   Patient's INR was 3.6  Per Tabitha, patient is currently taking warfarin therapy as: 4 mg on M,W,F and 5 mg on Sun, Tues, Thurs, and Saturday. No changes in diet.

## 2020-05-16 NOTE — Telephone Encounter (Signed)
Called patient, she said she will have her daughter pick up RX. This has been printed, signed, and placed up front

## 2020-05-19 ENCOUNTER — Telehealth: Payer: Self-pay

## 2020-05-19 NOTE — Telephone Encounter (Signed)
Patient's daughter notified of coumadin regiment. Patient's daughter verbalized her understanding.

## 2020-05-19 NOTE — Telephone Encounter (Signed)
Please call patient: Change Coumadin to 4 mg daily except 5 mg on tuesdays Recheck INR next week Weds

## 2020-05-19 NOTE — Telephone Encounter (Signed)
Brandy Hale from home health  services requesting verbal orders for patient to receive speech therapy 1x a week for 4 weeks, I attempted to return call was unable to reach [place] no voicemail available to leave a message to give verbal orders.

## 2020-05-20 ENCOUNTER — Telehealth: Payer: Self-pay

## 2020-05-20 NOTE — Telephone Encounter (Signed)
Second attempt to reach [place] with [facility], to give verbal orders for speech therapy, no voicemail or answering service available to leave message

## 2020-05-21 ENCOUNTER — Telehealth: Payer: Self-pay

## 2020-05-21 NOTE — Telephone Encounter (Signed)
Tabitha from Home health called about patients INR regiment from last week. She she says she called the office earlier but  had not been told what dosage of coumadin the patient was suppose to be taken, I advised nurse that the patients daughter was advised on dosage and patient,s INR was suppose to be checked today. The home health nurse said she was unware of these orders and will recheck INR on Tomorrow.

## 2020-05-22 DIAGNOSIS — R32 Unspecified urinary incontinence: Secondary | ICD-10-CM | POA: Diagnosis not present

## 2020-05-22 DIAGNOSIS — G8929 Other chronic pain: Secondary | ICD-10-CM | POA: Diagnosis not present

## 2020-05-22 DIAGNOSIS — H3530 Unspecified macular degeneration: Secondary | ICD-10-CM | POA: Diagnosis not present

## 2020-05-22 DIAGNOSIS — Z7901 Long term (current) use of anticoagulants: Secondary | ICD-10-CM | POA: Diagnosis not present

## 2020-05-22 DIAGNOSIS — I129 Hypertensive chronic kidney disease with stage 1 through stage 4 chronic kidney disease, or unspecified chronic kidney disease: Secondary | ICD-10-CM | POA: Diagnosis not present

## 2020-05-22 DIAGNOSIS — I4891 Unspecified atrial fibrillation: Secondary | ICD-10-CM | POA: Diagnosis not present

## 2020-05-22 DIAGNOSIS — M1990 Unspecified osteoarthritis, unspecified site: Secondary | ICD-10-CM | POA: Diagnosis not present

## 2020-05-22 DIAGNOSIS — N189 Chronic kidney disease, unspecified: Secondary | ICD-10-CM | POA: Diagnosis not present

## 2020-05-22 NOTE — Telephone Encounter (Signed)
Gloria Mckay from [facility] called requesting verbal order for speech therapy. Nurse advise to proceed with speech therapy. No other inquiries during the call.

## 2020-05-23 ENCOUNTER — Telehealth: Payer: Self-pay

## 2020-05-23 ENCOUNTER — Encounter: Payer: Self-pay | Admitting: Osteopathic Medicine

## 2020-05-23 NOTE — Telephone Encounter (Signed)
Task completed, spoke to [place] at home health. Advised Dr. Rodena Piety orders. She said she would relay message to the nurse and will follow up next week.

## 2020-05-23 NOTE — Telephone Encounter (Signed)
Lets have her hold today and then change to 4mg  daily.  Recheck in 1 week.

## 2020-05-23 NOTE — Telephone Encounter (Signed)
Home health nurse called to report patient's INR of 5.3. on yesterday. She would like to know what regiment the patient is to follow moving forward.

## 2020-05-26 ENCOUNTER — Other Ambulatory Visit: Payer: Self-pay | Admitting: Osteopathic Medicine

## 2020-05-26 DIAGNOSIS — H3530 Unspecified macular degeneration: Secondary | ICD-10-CM | POA: Diagnosis not present

## 2020-05-26 DIAGNOSIS — R32 Unspecified urinary incontinence: Secondary | ICD-10-CM | POA: Diagnosis not present

## 2020-05-26 DIAGNOSIS — N189 Chronic kidney disease, unspecified: Secondary | ICD-10-CM | POA: Diagnosis not present

## 2020-05-26 DIAGNOSIS — G8929 Other chronic pain: Secondary | ICD-10-CM | POA: Diagnosis not present

## 2020-05-26 DIAGNOSIS — I129 Hypertensive chronic kidney disease with stage 1 through stage 4 chronic kidney disease, or unspecified chronic kidney disease: Secondary | ICD-10-CM | POA: Diagnosis not present

## 2020-05-26 DIAGNOSIS — I4891 Unspecified atrial fibrillation: Secondary | ICD-10-CM | POA: Diagnosis not present

## 2020-05-26 DIAGNOSIS — M1990 Unspecified osteoarthritis, unspecified site: Secondary | ICD-10-CM | POA: Diagnosis not present

## 2020-05-26 DIAGNOSIS — Z7901 Long term (current) use of anticoagulants: Secondary | ICD-10-CM | POA: Diagnosis not present

## 2020-05-26 DIAGNOSIS — M48061 Spinal stenosis, lumbar region without neurogenic claudication: Secondary | ICD-10-CM

## 2020-05-26 NOTE — Telephone Encounter (Signed)
[place] with [facility] called today. She states the INR last week was 5.7 not 5.3. I did advise Gloria Mckay was advised to reduce to 4 mg daily, per Dr Zigmund Daniel note. There is a slight difference in the reported INR. Do we need to change the dose of Coumadin?

## 2020-05-26 NOTE — Telephone Encounter (Signed)
[place] advised.

## 2020-05-26 NOTE — Telephone Encounter (Signed)
Looks like you've been writing for this one. Alexander patient but requesting a refill.

## 2020-05-26 NOTE — Telephone Encounter (Signed)
Routing to covering provider.  °

## 2020-05-26 NOTE — Telephone Encounter (Signed)
Continue the same dose and recheck the INR as planned this week. No change in dosage at this time.

## 2020-05-27 ENCOUNTER — Telehealth: Payer: Self-pay

## 2020-05-27 DIAGNOSIS — Z7901 Long term (current) use of anticoagulants: Secondary | ICD-10-CM | POA: Diagnosis not present

## 2020-05-27 DIAGNOSIS — R32 Unspecified urinary incontinence: Secondary | ICD-10-CM | POA: Diagnosis not present

## 2020-05-27 DIAGNOSIS — I129 Hypertensive chronic kidney disease with stage 1 through stage 4 chronic kidney disease, or unspecified chronic kidney disease: Secondary | ICD-10-CM | POA: Diagnosis not present

## 2020-05-27 DIAGNOSIS — H3530 Unspecified macular degeneration: Secondary | ICD-10-CM | POA: Diagnosis not present

## 2020-05-27 DIAGNOSIS — M1990 Unspecified osteoarthritis, unspecified site: Secondary | ICD-10-CM | POA: Diagnosis not present

## 2020-05-27 DIAGNOSIS — N189 Chronic kidney disease, unspecified: Secondary | ICD-10-CM | POA: Diagnosis not present

## 2020-05-27 DIAGNOSIS — I4891 Unspecified atrial fibrillation: Secondary | ICD-10-CM | POA: Diagnosis not present

## 2020-05-27 DIAGNOSIS — G8929 Other chronic pain: Secondary | ICD-10-CM | POA: Diagnosis not present

## 2020-05-27 NOTE — Telephone Encounter (Signed)
Looks like she has 4mg  and 5mg  tablets. Take 5mg  Wednesday, Friday and Monday. Take 4mg  Tuesday, Thursday, Saturday, and Sunday.   Recheck in 1 week INR.   If BP still above 150/90 ok to increase hydralazine to three times a day.   Recheck in 2 days.   Levada Dy can you put this in anticogulant chart to be seen?

## 2020-05-27 NOTE — Telephone Encounter (Signed)
Task completed. Pt's daughter was updated of provider's note and recommendations. Agreeable with plan. Gloria Mckay will schedule the patient for INR check.

## 2020-05-27 NOTE — Telephone Encounter (Signed)
Ok that changes things a bit. She has not been on a solid week of 4mg  daily so lets do 4mg  daily and recheck in 1 week.

## 2020-05-27 NOTE — Telephone Encounter (Addendum)
[place] with [facility] called and reports INR of 1.6. She did hold the coumadin for 2 days at the end of last week. She was also on an antibiotic that she stopped on Wednesday of last week. She takes 4 mg daily with the exception of stopping for 2 days. Blood pressure today is 120/60.  [phone]  Rosey Bath with [facility] called and left a message reporting elevated blood pressure yesterday. 176/90. She stated no cardiovascular symptoms.   [facility] will be in the home today to check INR.   I did call patient this morning. She reports feeling fine. She is unable to check her blood pressure.

## 2020-05-27 NOTE — Telephone Encounter (Signed)
I didn't remember to give you last weeks INR results. She had an INR of 5.7. She stopped the dose for 2 days on her own without instruction by our office. This week she is free of antibiotics. Please advise.

## 2020-05-28 NOTE — Telephone Encounter (Signed)
Home health and patient advised.

## 2020-05-29 DIAGNOSIS — R32 Unspecified urinary incontinence: Secondary | ICD-10-CM | POA: Diagnosis not present

## 2020-05-29 DIAGNOSIS — M1990 Unspecified osteoarthritis, unspecified site: Secondary | ICD-10-CM | POA: Diagnosis not present

## 2020-05-29 DIAGNOSIS — Z7901 Long term (current) use of anticoagulants: Secondary | ICD-10-CM | POA: Diagnosis not present

## 2020-05-29 DIAGNOSIS — I129 Hypertensive chronic kidney disease with stage 1 through stage 4 chronic kidney disease, or unspecified chronic kidney disease: Secondary | ICD-10-CM | POA: Diagnosis not present

## 2020-05-29 DIAGNOSIS — N189 Chronic kidney disease, unspecified: Secondary | ICD-10-CM | POA: Diagnosis not present

## 2020-05-29 DIAGNOSIS — H3530 Unspecified macular degeneration: Secondary | ICD-10-CM | POA: Diagnosis not present

## 2020-05-29 DIAGNOSIS — I4891 Unspecified atrial fibrillation: Secondary | ICD-10-CM | POA: Diagnosis not present

## 2020-05-29 DIAGNOSIS — G8929 Other chronic pain: Secondary | ICD-10-CM | POA: Diagnosis not present

## 2020-05-30 ENCOUNTER — Telehealth: Payer: Self-pay

## 2020-05-30 MED ORDER — FLUCONAZOLE 150 MG PO TABS: 150.0000 mg | ORAL_TABLET | Freq: Once | ORAL | 0 refills | Status: AC

## 2020-05-30 NOTE — Telephone Encounter (Signed)
Gloria Mckay has recently been on an antibiotic. She now reports vaginal itching. Denies fever, chills or pelvic pain. She would like diflucan. Please advise.

## 2020-06-02 DIAGNOSIS — R32 Unspecified urinary incontinence: Secondary | ICD-10-CM | POA: Diagnosis not present

## 2020-06-02 DIAGNOSIS — G8929 Other chronic pain: Secondary | ICD-10-CM | POA: Diagnosis not present

## 2020-06-02 DIAGNOSIS — H3530 Unspecified macular degeneration: Secondary | ICD-10-CM | POA: Diagnosis not present

## 2020-06-02 DIAGNOSIS — N189 Chronic kidney disease, unspecified: Secondary | ICD-10-CM | POA: Diagnosis not present

## 2020-06-02 DIAGNOSIS — I129 Hypertensive chronic kidney disease with stage 1 through stage 4 chronic kidney disease, or unspecified chronic kidney disease: Secondary | ICD-10-CM | POA: Diagnosis not present

## 2020-06-02 DIAGNOSIS — I4891 Unspecified atrial fibrillation: Secondary | ICD-10-CM | POA: Diagnosis not present

## 2020-06-02 DIAGNOSIS — Z7901 Long term (current) use of anticoagulants: Secondary | ICD-10-CM | POA: Diagnosis not present

## 2020-06-02 DIAGNOSIS — M1990 Unspecified osteoarthritis, unspecified site: Secondary | ICD-10-CM | POA: Diagnosis not present

## 2020-06-03 ENCOUNTER — Telehealth: Payer: Self-pay

## 2020-06-03 DIAGNOSIS — I4891 Unspecified atrial fibrillation: Secondary | ICD-10-CM | POA: Diagnosis not present

## 2020-06-03 DIAGNOSIS — G8929 Other chronic pain: Secondary | ICD-10-CM | POA: Diagnosis not present

## 2020-06-03 DIAGNOSIS — Z7901 Long term (current) use of anticoagulants: Secondary | ICD-10-CM | POA: Diagnosis not present

## 2020-06-03 DIAGNOSIS — N189 Chronic kidney disease, unspecified: Secondary | ICD-10-CM | POA: Diagnosis not present

## 2020-06-03 DIAGNOSIS — M1990 Unspecified osteoarthritis, unspecified site: Secondary | ICD-10-CM | POA: Diagnosis not present

## 2020-06-03 DIAGNOSIS — I129 Hypertensive chronic kidney disease with stage 1 through stage 4 chronic kidney disease, or unspecified chronic kidney disease: Secondary | ICD-10-CM | POA: Diagnosis not present

## 2020-06-03 DIAGNOSIS — H3530 Unspecified macular degeneration: Secondary | ICD-10-CM | POA: Diagnosis not present

## 2020-06-03 DIAGNOSIS — R32 Unspecified urinary incontinence: Secondary | ICD-10-CM | POA: Diagnosis not present

## 2020-06-03 NOTE — Telephone Encounter (Signed)
Gloria Mckay at [facility] called to get verbal orders to continue with speech therapy for cognitive impairment.  The would be seeing her once weekly for the next 8 weeks.  Please leave Gloria Mckay a detailed message if you are unable to reach her at [phone]

## 2020-06-03 NOTE — Telephone Encounter (Signed)
Gloria Mckay with [facility] called with INR results 3.9. She is taking 4 mg of coumadin daily.   Last week her INR was 1.6 and the week before was 5.7. She has been off the antibiotic for almost 2 weeks.

## 2020-06-03 NOTE — Telephone Encounter (Signed)
Can take 4 mg Mon, Wed, Fri, Sat and 2 mg Sun, Tue, Thu. Recheck INR in a week

## 2020-06-03 NOTE — Telephone Encounter (Signed)
Left detailed message for [facility] with what patient current coumadin regimen should be and recommended follow up.    Patient is also aware of the change in her dosing for the week and the need to have her INR rechecked next week.

## 2020-06-04 NOTE — Telephone Encounter (Signed)
OK to give verbal orders (CMA should be able to give these, Apolonio Schneiders can we make sure thi sis the case / educate Christal on this if needed)

## 2020-06-04 NOTE — Telephone Encounter (Signed)
Tried Christy's VM; it rings but no VM is available.

## 2020-06-04 NOTE — Telephone Encounter (Signed)
Spoke with [place] at [facility] and gave verbal order to continue with speech therapy.

## 2020-06-06 DIAGNOSIS — R32 Unspecified urinary incontinence: Secondary | ICD-10-CM | POA: Diagnosis not present

## 2020-06-06 DIAGNOSIS — H3530 Unspecified macular degeneration: Secondary | ICD-10-CM | POA: Diagnosis not present

## 2020-06-06 DIAGNOSIS — I482 Chronic atrial fibrillation, unspecified: Secondary | ICD-10-CM | POA: Diagnosis not present

## 2020-06-06 DIAGNOSIS — G8929 Other chronic pain: Secondary | ICD-10-CM | POA: Diagnosis not present

## 2020-06-06 DIAGNOSIS — I4891 Unspecified atrial fibrillation: Secondary | ICD-10-CM | POA: Diagnosis not present

## 2020-06-06 DIAGNOSIS — M1990 Unspecified osteoarthritis, unspecified site: Secondary | ICD-10-CM | POA: Diagnosis not present

## 2020-06-06 DIAGNOSIS — I129 Hypertensive chronic kidney disease with stage 1 through stage 4 chronic kidney disease, or unspecified chronic kidney disease: Secondary | ICD-10-CM | POA: Diagnosis not present

## 2020-06-06 DIAGNOSIS — N189 Chronic kidney disease, unspecified: Secondary | ICD-10-CM | POA: Diagnosis not present

## 2020-06-06 DIAGNOSIS — Z7901 Long term (current) use of anticoagulants: Secondary | ICD-10-CM | POA: Diagnosis not present

## 2020-06-09 DIAGNOSIS — M1990 Unspecified osteoarthritis, unspecified site: Secondary | ICD-10-CM | POA: Diagnosis not present

## 2020-06-09 DIAGNOSIS — R32 Unspecified urinary incontinence: Secondary | ICD-10-CM | POA: Diagnosis not present

## 2020-06-09 DIAGNOSIS — I4891 Unspecified atrial fibrillation: Secondary | ICD-10-CM | POA: Diagnosis not present

## 2020-06-09 DIAGNOSIS — I482 Chronic atrial fibrillation, unspecified: Secondary | ICD-10-CM | POA: Diagnosis not present

## 2020-06-09 DIAGNOSIS — H3530 Unspecified macular degeneration: Secondary | ICD-10-CM | POA: Diagnosis not present

## 2020-06-09 DIAGNOSIS — N189 Chronic kidney disease, unspecified: Secondary | ICD-10-CM | POA: Diagnosis not present

## 2020-06-09 DIAGNOSIS — G8929 Other chronic pain: Secondary | ICD-10-CM | POA: Diagnosis not present

## 2020-06-09 DIAGNOSIS — Z7901 Long term (current) use of anticoagulants: Secondary | ICD-10-CM | POA: Diagnosis not present

## 2020-06-09 DIAGNOSIS — I129 Hypertensive chronic kidney disease with stage 1 through stage 4 chronic kidney disease, or unspecified chronic kidney disease: Secondary | ICD-10-CM | POA: Diagnosis not present

## 2020-06-10 DIAGNOSIS — I129 Hypertensive chronic kidney disease with stage 1 through stage 4 chronic kidney disease, or unspecified chronic kidney disease: Secondary | ICD-10-CM | POA: Diagnosis not present

## 2020-06-10 DIAGNOSIS — I482 Chronic atrial fibrillation, unspecified: Secondary | ICD-10-CM | POA: Diagnosis not present

## 2020-06-10 DIAGNOSIS — G8929 Other chronic pain: Secondary | ICD-10-CM | POA: Diagnosis not present

## 2020-06-10 DIAGNOSIS — R32 Unspecified urinary incontinence: Secondary | ICD-10-CM | POA: Diagnosis not present

## 2020-06-10 DIAGNOSIS — I4891 Unspecified atrial fibrillation: Secondary | ICD-10-CM | POA: Diagnosis not present

## 2020-06-10 DIAGNOSIS — M1990 Unspecified osteoarthritis, unspecified site: Secondary | ICD-10-CM | POA: Diagnosis not present

## 2020-06-10 DIAGNOSIS — N189 Chronic kidney disease, unspecified: Secondary | ICD-10-CM | POA: Diagnosis not present

## 2020-06-10 DIAGNOSIS — H3530 Unspecified macular degeneration: Secondary | ICD-10-CM | POA: Diagnosis not present

## 2020-06-10 DIAGNOSIS — Z7901 Long term (current) use of anticoagulants: Secondary | ICD-10-CM | POA: Diagnosis not present

## 2020-06-11 ENCOUNTER — Telehealth: Payer: Self-pay

## 2020-06-11 NOTE — Telephone Encounter (Signed)
Gloria Mckay with [facility] advised. She is taking as mentioned in message below.

## 2020-06-11 NOTE — Telephone Encounter (Signed)
Tabitha from [facility] called to report patient's INR of 1.4, Please advise

## 2020-06-11 NOTE — Telephone Encounter (Signed)
Please confirm she is taking  4 mg Mon, Wed, Fri, Sat  2 mg Sun, Tue, Thu.   If YES: 4 mg Mon, Wed, Thu, Fri, Sun 2 mg Tue, Fri Recheck INR in a week  If NO: Need dosing and then will adjust

## 2020-06-14 IMAGING — DX DG PELVIS 1-2V
1 series · 1 of 1 positions shown · non-contrast
Comparison: None.

CLINICAL DATA: Initial encounter for Pt states she sat down hard on
a step 3 weeks ago and since then she has had right posterior
buttock pain. Pt unable to lay flat but is able to stand.

EXAM:
PELVIS - 1-2 VIEW

[pelvis ap]
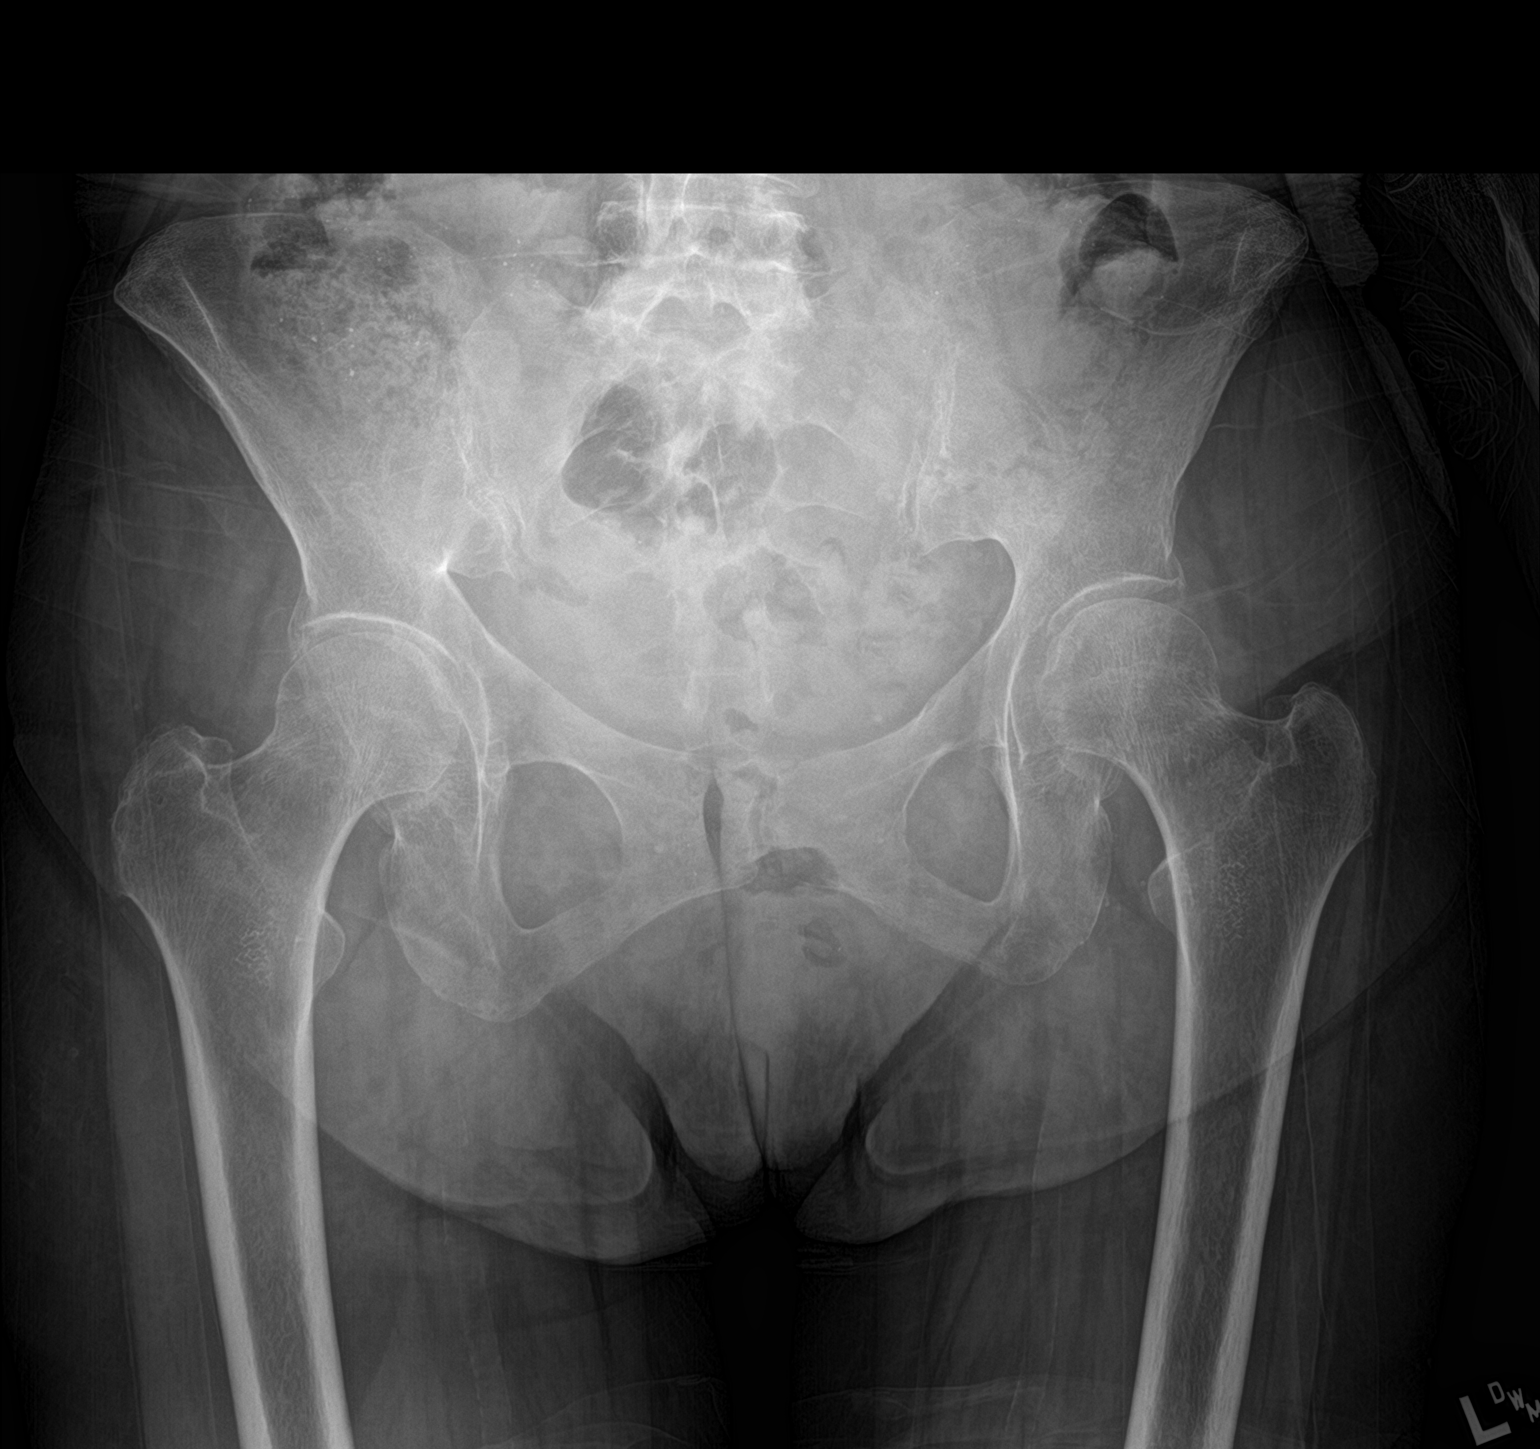

[1 of 1 positions shown; findings below may reference images not displayed]

FINDINGS: Femoral heads are located. Mild osteopenia. Sacroiliac joints are
symmetric. No acute fracture.
IMPRESSION: No acute osseous abnormality.

## 2020-06-17 ENCOUNTER — Telehealth: Payer: Self-pay

## 2020-06-17 DIAGNOSIS — R32 Unspecified urinary incontinence: Secondary | ICD-10-CM | POA: Diagnosis not present

## 2020-06-17 DIAGNOSIS — I482 Chronic atrial fibrillation, unspecified: Secondary | ICD-10-CM | POA: Diagnosis not present

## 2020-06-17 DIAGNOSIS — G8929 Other chronic pain: Secondary | ICD-10-CM | POA: Diagnosis not present

## 2020-06-17 DIAGNOSIS — H3530 Unspecified macular degeneration: Secondary | ICD-10-CM | POA: Diagnosis not present

## 2020-06-17 DIAGNOSIS — N189 Chronic kidney disease, unspecified: Secondary | ICD-10-CM | POA: Diagnosis not present

## 2020-06-17 DIAGNOSIS — Z7901 Long term (current) use of anticoagulants: Secondary | ICD-10-CM | POA: Diagnosis not present

## 2020-06-17 DIAGNOSIS — I4891 Unspecified atrial fibrillation: Secondary | ICD-10-CM | POA: Diagnosis not present

## 2020-06-17 DIAGNOSIS — I129 Hypertensive chronic kidney disease with stage 1 through stage 4 chronic kidney disease, or unspecified chronic kidney disease: Secondary | ICD-10-CM | POA: Diagnosis not present

## 2020-06-17 DIAGNOSIS — M1990 Unspecified osteoarthritis, unspecified site: Secondary | ICD-10-CM | POA: Diagnosis not present

## 2020-06-17 NOTE — Telephone Encounter (Signed)
Tabitha with [facility] called to report an INR of 1.3 today  Confirms patient taking 2 mg Tuesday and Saturday, 4 mg all other days  Has not taken dose today   Requesting OK for VO for urinalysis. Experiencing dysuria  Tabitha CB# [phone]

## 2020-06-18 NOTE — Telephone Encounter (Signed)
Change to 4 mg all days except Saturday Repeat INR next Thurs or Fru

## 2020-06-18 NOTE — Telephone Encounter (Signed)
Needs urine CULTURE and urinalysis w/ microscopic eval

## 2020-06-18 NOTE — Telephone Encounter (Signed)
Tabitha called again from [facility], requesting OK for VO for urinalysis. Experiencing dysuria. Pls advise.   Tabitha CB# [phone]

## 2020-06-19 ENCOUNTER — Encounter: Payer: Self-pay | Admitting: Osteopathic Medicine

## 2020-06-19 DIAGNOSIS — N390 Urinary tract infection, site not specified: Secondary | ICD-10-CM | POA: Diagnosis not present

## 2020-06-19 NOTE — Telephone Encounter (Signed)
Gloria Mckay dvised of all VO for urine and also change in dose/recheck for INR

## 2020-06-24 DIAGNOSIS — H3530 Unspecified macular degeneration: Secondary | ICD-10-CM | POA: Diagnosis not present

## 2020-06-24 DIAGNOSIS — I4891 Unspecified atrial fibrillation: Secondary | ICD-10-CM | POA: Diagnosis not present

## 2020-06-24 DIAGNOSIS — N189 Chronic kidney disease, unspecified: Secondary | ICD-10-CM | POA: Diagnosis not present

## 2020-06-24 DIAGNOSIS — R32 Unspecified urinary incontinence: Secondary | ICD-10-CM | POA: Diagnosis not present

## 2020-06-24 DIAGNOSIS — I482 Chronic atrial fibrillation, unspecified: Secondary | ICD-10-CM | POA: Diagnosis not present

## 2020-06-24 DIAGNOSIS — Z7901 Long term (current) use of anticoagulants: Secondary | ICD-10-CM | POA: Diagnosis not present

## 2020-06-24 DIAGNOSIS — M1990 Unspecified osteoarthritis, unspecified site: Secondary | ICD-10-CM | POA: Diagnosis not present

## 2020-06-24 DIAGNOSIS — G8929 Other chronic pain: Secondary | ICD-10-CM | POA: Diagnosis not present

## 2020-06-24 DIAGNOSIS — I129 Hypertensive chronic kidney disease with stage 1 through stage 4 chronic kidney disease, or unspecified chronic kidney disease: Secondary | ICD-10-CM | POA: Diagnosis not present

## 2020-06-27 ENCOUNTER — Telehealth: Payer: Self-pay

## 2020-06-27 DIAGNOSIS — H3530 Unspecified macular degeneration: Secondary | ICD-10-CM | POA: Diagnosis not present

## 2020-06-27 DIAGNOSIS — R32 Unspecified urinary incontinence: Secondary | ICD-10-CM | POA: Diagnosis not present

## 2020-06-27 DIAGNOSIS — I129 Hypertensive chronic kidney disease with stage 1 through stage 4 chronic kidney disease, or unspecified chronic kidney disease: Secondary | ICD-10-CM | POA: Diagnosis not present

## 2020-06-27 DIAGNOSIS — I482 Chronic atrial fibrillation, unspecified: Secondary | ICD-10-CM | POA: Diagnosis not present

## 2020-06-27 DIAGNOSIS — Z7901 Long term (current) use of anticoagulants: Secondary | ICD-10-CM | POA: Diagnosis not present

## 2020-06-27 DIAGNOSIS — G8929 Other chronic pain: Secondary | ICD-10-CM | POA: Diagnosis not present

## 2020-06-27 DIAGNOSIS — M1990 Unspecified osteoarthritis, unspecified site: Secondary | ICD-10-CM | POA: Diagnosis not present

## 2020-06-27 DIAGNOSIS — I4891 Unspecified atrial fibrillation: Secondary | ICD-10-CM | POA: Diagnosis not present

## 2020-06-27 DIAGNOSIS — N189 Chronic kidney disease, unspecified: Secondary | ICD-10-CM | POA: Diagnosis not present

## 2020-06-27 NOTE — Telephone Encounter (Signed)
Gloria Mckay from [facility] called with INR results.   INR 1.4  Missed Doses  She states she was taking 4 mg daily except for 2 mg on Saturday. However, patient did not take the coumadin for 2-3 day awaiting a call back from [place].

## 2020-06-27 NOTE — Telephone Encounter (Signed)
Patient and home health advised.

## 2020-06-27 NOTE — Telephone Encounter (Signed)
Stay on same dose BUT DO NOT MISS and recheck in 1 week.

## 2020-06-30 ENCOUNTER — Other Ambulatory Visit: Payer: Self-pay | Admitting: Sports Medicine

## 2020-06-30 DIAGNOSIS — R32 Unspecified urinary incontinence: Secondary | ICD-10-CM | POA: Diagnosis not present

## 2020-06-30 DIAGNOSIS — Z7901 Long term (current) use of anticoagulants: Secondary | ICD-10-CM | POA: Diagnosis not present

## 2020-06-30 DIAGNOSIS — N189 Chronic kidney disease, unspecified: Secondary | ICD-10-CM | POA: Diagnosis not present

## 2020-06-30 DIAGNOSIS — G8929 Other chronic pain: Secondary | ICD-10-CM | POA: Diagnosis not present

## 2020-06-30 DIAGNOSIS — I482 Chronic atrial fibrillation, unspecified: Secondary | ICD-10-CM | POA: Diagnosis not present

## 2020-06-30 DIAGNOSIS — M1990 Unspecified osteoarthritis, unspecified site: Secondary | ICD-10-CM | POA: Diagnosis not present

## 2020-06-30 DIAGNOSIS — M48061 Spinal stenosis, lumbar region without neurogenic claudication: Secondary | ICD-10-CM

## 2020-06-30 DIAGNOSIS — I129 Hypertensive chronic kidney disease with stage 1 through stage 4 chronic kidney disease, or unspecified chronic kidney disease: Secondary | ICD-10-CM | POA: Diagnosis not present

## 2020-06-30 DIAGNOSIS — I4891 Unspecified atrial fibrillation: Secondary | ICD-10-CM | POA: Diagnosis not present

## 2020-06-30 DIAGNOSIS — H3530 Unspecified macular degeneration: Secondary | ICD-10-CM | POA: Diagnosis not present

## 2020-07-02 ENCOUNTER — Telehealth: Payer: Self-pay | Admitting: Osteopathic Medicine

## 2020-07-02 MED ORDER — NITROFURANTOIN MONOHYD MACRO 100 MG PO CAPS: 100.0000 mg | ORAL_CAPSULE | Freq: Two times a day (BID) | ORAL | 0 refills | Status: DC

## 2020-07-02 NOTE — Telephone Encounter (Signed)
Pt has been advised of results and aware that antibiotics sent to the pharmacy. Pt was agreeable with provider's plan. No other inquiries during the call.

## 2020-07-02 NOTE — Telephone Encounter (Signed)
Please call patient:  Received culture results from [facility], (+)UTI I sent antibiotics If worse, needs visit

## 2020-07-04 DIAGNOSIS — R112 Nausea with vomiting, unspecified: Secondary | ICD-10-CM | POA: Diagnosis not present

## 2020-07-04 DIAGNOSIS — R1010 Upper abdominal pain, unspecified: Secondary | ICD-10-CM | POA: Diagnosis not present

## 2020-07-04 DIAGNOSIS — I10 Essential (primary) hypertension: Secondary | ICD-10-CM | POA: Diagnosis not present

## 2020-07-04 DIAGNOSIS — R1030 Lower abdominal pain, unspecified: Secondary | ICD-10-CM | POA: Diagnosis not present

## 2020-07-06 DIAGNOSIS — H3530 Unspecified macular degeneration: Secondary | ICD-10-CM | POA: Diagnosis not present

## 2020-07-06 DIAGNOSIS — I4891 Unspecified atrial fibrillation: Secondary | ICD-10-CM | POA: Diagnosis not present

## 2020-07-06 DIAGNOSIS — M1990 Unspecified osteoarthritis, unspecified site: Secondary | ICD-10-CM | POA: Diagnosis not present

## 2020-07-06 DIAGNOSIS — Z7901 Long term (current) use of anticoagulants: Secondary | ICD-10-CM | POA: Diagnosis not present

## 2020-07-06 DIAGNOSIS — G8929 Other chronic pain: Secondary | ICD-10-CM | POA: Diagnosis not present

## 2020-07-06 DIAGNOSIS — N189 Chronic kidney disease, unspecified: Secondary | ICD-10-CM | POA: Diagnosis not present

## 2020-07-06 DIAGNOSIS — I482 Chronic atrial fibrillation, unspecified: Secondary | ICD-10-CM | POA: Diagnosis not present

## 2020-07-06 DIAGNOSIS — R32 Unspecified urinary incontinence: Secondary | ICD-10-CM | POA: Diagnosis not present

## 2020-07-06 DIAGNOSIS — I129 Hypertensive chronic kidney disease with stage 1 through stage 4 chronic kidney disease, or unspecified chronic kidney disease: Secondary | ICD-10-CM | POA: Diagnosis not present

## 2020-07-08 DIAGNOSIS — H3530 Unspecified macular degeneration: Secondary | ICD-10-CM | POA: Diagnosis not present

## 2020-07-08 DIAGNOSIS — I482 Chronic atrial fibrillation, unspecified: Secondary | ICD-10-CM | POA: Diagnosis not present

## 2020-07-08 DIAGNOSIS — G8929 Other chronic pain: Secondary | ICD-10-CM | POA: Diagnosis not present

## 2020-07-08 DIAGNOSIS — I129 Hypertensive chronic kidney disease with stage 1 through stage 4 chronic kidney disease, or unspecified chronic kidney disease: Secondary | ICD-10-CM | POA: Diagnosis not present

## 2020-07-08 DIAGNOSIS — R32 Unspecified urinary incontinence: Secondary | ICD-10-CM | POA: Diagnosis not present

## 2020-07-08 DIAGNOSIS — Z7901 Long term (current) use of anticoagulants: Secondary | ICD-10-CM | POA: Diagnosis not present

## 2020-07-08 DIAGNOSIS — M1990 Unspecified osteoarthritis, unspecified site: Secondary | ICD-10-CM | POA: Diagnosis not present

## 2020-07-08 DIAGNOSIS — I4891 Unspecified atrial fibrillation: Secondary | ICD-10-CM | POA: Diagnosis not present

## 2020-07-08 DIAGNOSIS — N189 Chronic kidney disease, unspecified: Secondary | ICD-10-CM | POA: Diagnosis not present

## 2020-07-10 ENCOUNTER — Telehealth: Payer: Self-pay | Admitting: *Deleted

## 2020-07-10 DIAGNOSIS — M1990 Unspecified osteoarthritis, unspecified site: Secondary | ICD-10-CM | POA: Diagnosis not present

## 2020-07-10 DIAGNOSIS — H3530 Unspecified macular degeneration: Secondary | ICD-10-CM | POA: Diagnosis not present

## 2020-07-10 DIAGNOSIS — I4891 Unspecified atrial fibrillation: Secondary | ICD-10-CM | POA: Diagnosis not present

## 2020-07-10 DIAGNOSIS — I482 Chronic atrial fibrillation, unspecified: Secondary | ICD-10-CM | POA: Diagnosis not present

## 2020-07-10 DIAGNOSIS — G8929 Other chronic pain: Secondary | ICD-10-CM | POA: Diagnosis not present

## 2020-07-10 DIAGNOSIS — R32 Unspecified urinary incontinence: Secondary | ICD-10-CM | POA: Diagnosis not present

## 2020-07-10 DIAGNOSIS — N189 Chronic kidney disease, unspecified: Secondary | ICD-10-CM | POA: Diagnosis not present

## 2020-07-10 DIAGNOSIS — Z7901 Long term (current) use of anticoagulants: Secondary | ICD-10-CM | POA: Diagnosis not present

## 2020-07-10 DIAGNOSIS — I129 Hypertensive chronic kidney disease with stage 1 through stage 4 chronic kidney disease, or unspecified chronic kidney disease: Secondary | ICD-10-CM | POA: Diagnosis not present

## 2020-07-10 MED ORDER — WARFARIN SODIUM 4 MG PO TABS
ORAL_TABLET | ORAL | 0 refills | Status: DC
Start: 2020-07-10 — End: 2020-08-11

## 2020-07-10 NOTE — Telephone Encounter (Signed)
Needs to take 4 mg daily

## 2020-07-10 NOTE — Telephone Encounter (Signed)
Tabitha from [facility] called reporting Gloria Mckay's INR at1.6 this afternoon.

## 2020-07-10 NOTE — Telephone Encounter (Signed)
And recheck in 1 week

## 2020-07-11 DIAGNOSIS — D46C Myelodysplastic syndrome with isolated del(5q) chromosomal abnormality: Secondary | ICD-10-CM | POA: Diagnosis not present

## 2020-07-11 NOTE — Telephone Encounter (Signed)
Task completed. Tabitha from [facility] informed of provider's recommendation. She will contact the patient regarding change in warfarin therapy. No other inquiries during the call.

## 2020-07-14 ENCOUNTER — Encounter: Payer: Self-pay | Admitting: Nurse Practitioner

## 2020-07-14 ENCOUNTER — Ambulatory Visit: Payer: Medicare Other

## 2020-07-14 ENCOUNTER — Ambulatory Visit (INDEPENDENT_AMBULATORY_CARE_PROVIDER_SITE_OTHER): Payer: Medicare HMO | Admitting: Nurse Practitioner

## 2020-07-14 VITALS — BP 152/69 | HR 56 | Temp 97.9°F | Ht 63.0 in | Wt 106.0 lb

## 2020-07-14 DIAGNOSIS — Z86718 Personal history of other venous thrombosis and embolism: Secondary | ICD-10-CM | POA: Diagnosis not present

## 2020-07-14 DIAGNOSIS — Z0000 Encounter for general adult medical examination without abnormal findings: Secondary | ICD-10-CM | POA: Diagnosis not present

## 2020-07-14 DIAGNOSIS — I4891 Unspecified atrial fibrillation: Secondary | ICD-10-CM

## 2020-07-14 DIAGNOSIS — R300 Dysuria: Secondary | ICD-10-CM | POA: Diagnosis not present

## 2020-07-14 LAB — POCT URINALYSIS DIP (CLINITEK)
Bilirubin, UA: NEGATIVE
Glucose, UA: NEGATIVE mg/dL
Ketones, POC UA: NEGATIVE mg/dL
Nitrite, UA: NEGATIVE
POC PROTEIN,UA: NEGATIVE
Spec Grav, UA: 1.025 (ref 1.010–1.025)
Urobilinogen, UA: 0.2 E.U./dL
pH, UA: 5.5 (ref 5.0–8.0)

## 2020-07-14 MED ORDER — WARFARIN SODIUM 2 MG PO TABS: 2.0000 mg | ORAL_TABLET | Freq: Every day | ORAL | 3 refills | Status: DC

## 2020-07-14 NOTE — Assessment & Plan Note (Addendum)
Patient up to date on preventative health services.  Will plan to reconcile labs from oncologist and review.  Mammogram this year- will plan to reconcile the results.  No longer desire DEXA.  No longer desire colonoscopy.  Declines COVID vaccination.  Tdap due 2022

## 2020-07-14 NOTE — Assessment & Plan Note (Signed)
INR monitored at home by home health at this time.  Patient reports that this method is extremely helpful, but she is concerned her current home health benefits will run out in the near future.  Refills provided for 2mg  Coumadin as patient is reportedly cutting her 4mg  tablet in 1/2 to achieve this dosage.   Continue INR checks with home health- will plan to evaluate if these services can be extended.

## 2020-07-14 NOTE — Assessment & Plan Note (Signed)
See afib A&P

## 2020-07-14 NOTE — Assessment & Plan Note (Signed)
Recent illness with cystitis requiring antibiotic therapy x2. Patient reports her symptoms are improved and her memory is better.  Will test today for any residual signs of infection per recommendations from PCP.  Will notify patient of results.

## 2020-07-14 NOTE — Progress Notes (Signed)
Subjective:   Gloria Mckay is a 79 y.o. female who presents for Medicare Annual (Subsequent) preventive examination.   Encounter participants: Patient: Gloria Mckay  And her daughter  Review of Systems:  Endorses: Difficulty hearing due to hearing aids currently being repaired- finds it difficult to engage in social situations due to hearing deficits.  Difficulty seeing due to macular degeneration. No longer drives. Has services with [employer] to help with adaptive devices for visual impairment.  Chronic pain in left leg, hips, and back. Utilizes walker to ambulate, but reports that her ankles will give out on her unexpectedly at times. Able to get around her apartment independently, but exercise and travel outside of the home are difficult.  Recent UTI infection with mental status changes improving since completion of antibiotic therapy.  Chronic weakness. Increased stress recently and infection suspected of causing increase in palpitations due to Afib.  Chronic diarrhea- worsened with recent antibiotic treatment.   Denies shortness of breath, difficulty swallowing, abdominal pain, chest pain, urinary symptoms.  Cardiac Risk Factors include: advanced age (>37men, >41 women);hypertension;sedentary lifestyle     Objective:     Vitals: BP (!) 152/69   Pulse (!) 56   Temp 97.9 F (36.6 C) (Oral)   Ht 5\' 3"  (1.6 m)   Wt 106 lb 0.6 oz (48.1 kg)   SpO2 100%   BMI 18.78 kg/m   Body mass index is 18.78 kg/m.  Advanced Directives 07/14/2020 07/10/2019 07/04/2018  Does Patient Have a Medical Advance Directive? No;Yes Yes Yes  Type of Paramedic of [place];Living will [place];Living will [place];Living will  Does patient want to make changes to medical advance directive? No - Patient declined No - Patient declined No - Patient declined  Copy of [place] in Chart? No - copy requested No  - copy requested No - copy requested  Would patient like information on creating a medical advance directive? Yes (MAU/Ambulatory/Procedural Areas - Information given) - -    Tobacco Social History   Tobacco Use  Smoking Status Never Smoker  Smokeless Tobacco Never Used     Counseling given: Not Answered   Clinical Intake:  Pre-visit preparation completed: Yes  Pain : No/denies pain Pain Score: 0-No pain     BMI - recorded: 18.78 Nutritional Status: BMI <19  Underweight Nutritional Risks: None Diabetes: No  How often do you need to have someone help you when you read instructions, pamphlets, or other written materials from your doctor or pharmacy?: 1 - Never (Vision difficulties) What is the last grade level you completed in school?: 12  Interpreter Needed?: No  Information entered by :: S. Livy   Past Medical History:  Diagnosis Date  . Arthritis   . Cancer ([place])   . Cataract    bilaterally  . Hypertension   . Myelodysplasia (myelodysplastic syndrome) ([place])   . Myelodysplastic syndrome [facility])    Past Surgical History:  Procedure Laterality Date  . JOINT REPLACEMENT    . STOMACH SURGERY     Family History  Problem Relation Age of Onset  . Hypertension Mother   . Pulmonary embolism Mother   . Heart disease Father    Social History   Socioeconomic History  . Marital status: Widowed    Spouse name: Not on file  . Number of children: 4  . Years of education: 34  . Highest education level: 12th grade  Occupational History  .  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 Smoking status: Never Smoker  . Smokeless tobacco: Never Used  Vaping Use  . Vaping Use: Never used  Substance and Sexual Activity  . Alcohol use: No  . Drug use: No  . Sexual activity: Not Currently  Other Topics Concern  . Not on file  Social History Narrative   Recent illness resulting in decreased ability to ambulate causing social isolation.    Social  Determinants of Health   Financial Resource Strain: Low Risk   . Difficulty of Paying Living Expenses: Not hard at all  Food Insecurity: No Food Insecurity  . Worried About Charity fundraiser in the Last Year: Never true  . Ran Out of Food in the Last Year: Never true  Transportation Needs: No Transportation Needs  . Lack of Transportation (Medical): No  . Lack of Transportation (Non-Medical): No  Physical Activity: Inactive  . Days of Exercise per Week: 0 days  . Minutes of Exercise per Session: 0 min  Stress: Stress Concern Present  . Feeling of Stress : To some extent  Social Connections: Socially Isolated  . Frequency of Communication with Friends and Family: More than three times a week  . Frequency of Social Gatherings with Friends and Family: More than three times a week  . Attends Religious Services: Never  . Active Member of Clubs or Organizations: No  . Attends Archivist Meetings: Never  . Marital Status: Widowed    Outpatient Encounter Medications as of 07/14/2020  Medication Sig  . AMBULATORY NON FORMULARY MEDICATION Medication Name: rollator walker with seat per patient preference and insurance coverage  . amLODipine (NORVASC) 10 MG tablet Take 1 tablet (10 mg total) by mouth daily.  . Calcium Carbonate-Vitamin D 600-400 MG-UNIT chew tablet Chew 2 tablets by mouth daily.  . carvedilol (COREG) 25 MG tablet Take 1 tablet (25 mg total) by mouth 2 (two) times daily with a meal.  . cyanocobalamin (,VITAMIN B-12,) 1000 MCG/ML injection Inject 1 mL (1,000 mcg total) into the muscle every 30 (thirty) days. Please include syringes and needles as needed for monthly injections  . denosumab (PROLIA) 60 MG/ML SOSY injection Inject 60 mg into the skin every 6 (six) months. Administer in upper arm, thigh, or abdomen  . esomeprazole (NEXIUM) 40 MG capsule Take 1 capsule (40 mg total) by mouth 2 (two) times daily before a meal.  . hydrALAZINE (APRESOLINE) 25 MG tablet Take 1  tablet (25 mg total) by mouth 2 (two) times daily.  Marland Kitchen HYDROcodone-acetaminophen (NORCO) 10-325 MG tablet Take 1 tablet by mouth 3 (three) times daily as needed for moderate pain or severe pain.  . hydrOXYzine (ATARAX/VISTARIL) 25 MG tablet Take 0.5-1 tablets (12.5-25 mg total) by mouth every 8 (eight) hours as needed for itching.  . losartan (COZAAR) 100 MG tablet Take 1 tablet (100 mg total) by mouth daily.  . nitrofurantoin, macrocrystal-monohydrate, (MACROBID) 100 MG capsule Take 1 capsule (100 mg total) by mouth 2 (two) times daily.  . Omega-3 Fatty Acids ([employer] FISH OIL PO) Take by mouth.  . Prasterone (INTRAROSA) 6.5 MG INST Place 1 suppository vaginally at bedtime.  . sucralfate (CARAFATE) 1 g tablet Take 1 tablet by mouth 4 (four) times daily -  with meals and at bedtime.  . topiramate (TOPAMAX) 100 MG tablet Take 1.5 tabs (150 mg) in AM and 2 tabs (200 mg) in PM  . Vitamin D, Ergocalciferol, (DRISDOL) 1.25 MG (50000  UT) CAPS capsule Take 1 capsule (50,000 Units total) by mouth every 7 (seven) days.  Marland Kitchen warfarin (COUMADIN) 4 MG tablet 4 mg daily as of 07/10/20  . warfarin (COUMADIN) 2 MG tablet Take 1 tablet (2 mg total) by mouth daily.  . [DISCONTINUED] esomeprazole (NEXIUM) 40 MG capsule Take 1 capsule by mouth daily.   No facility-administered encounter medications on file as of 07/14/2020.    Activities of Daily Living In your present state of health, do you have any difficulty performing the following activities: 07/14/2020  Hearing? Y  Vision? N  Difficulty concentrating or making decisions? N  Walking or climbing stairs? N  Dressing or bathing? N  Doing errands, shopping? N  Preparing Food and eating ? N  Using the Toilet? N  In the past six months, have you accidently leaked urine? N  Do you have problems with loss of bowel control? N  Managing your Medications? N  Managing your Finances? N  Housekeeping or managing your Housekeeping? N  Some recent data might be hidden     Patient Care Team: Emeterio Reeve, DO as PCP - General (Osteopathic Medicine) Constance Haw, MD as PCP - Electrophysiology (Cardiology)    Assessment:   This is a routine wellness examination for Gloria Mckay.  Exercise Activities and Dietary recommendations Current Exercise Habits: The patient does not participate in regular exercise at present  Goals    . Patient Stated     Wants to get help with getting her upper body strength back.    . Patient Stated     Would like to get her strength back and play the piano again.         Fall Risk Fall Risk  07/14/2020 07/25/2019 07/10/2019 07/04/2018 03/30/2018  Falls in the past year? 0 1 1 0 Yes  Comment - Emmi Telephone Survey: data to providers prior to load - - Emmi Telephone Survey: data to providers prior to load  Number falls in past yr: 0 1 1 - 1  Comment - Emmi Telephone Survey Actual Response = 1 - - Emmi Telephone Survey Actual Response = 1  Injury with Fall? 0 1 0 - Yes  Risk for fall due to : - - Impaired balance/gait;Impaired mobility;History of fall(s) Impaired mobility;Impaired balance/gait -  Follow up Falls evaluation completed - - Falls prevention discussed -   Is the patient's home free of loose throw rugs in walkways, pet beds, electrical cords, etc?   yes      Grab bars in the bathroom? yes      Handrails on the stairs?   yes      Adequate lighting?   yes  Patient rating of health (0-10) scale: 3   Depression Screen PHQ 2/9 Scores 07/14/2020 07/10/2019 10/30/2018 07/04/2018  PHQ - 2 Score 1 0 2 0  PHQ- 9 Score - - 9 -     Cognitive Function     6CIT Screen 07/14/2020 07/10/2019 07/04/2018  What Year? 0 points 0 points 0 points  What month? 0 points 0 points 0 points  What time? 0 points 0 points 0 points  Count back from 20 2 points 0 points 0 points  Months in reverse 2 points 0 points 2 points  Repeat phrase 4 points 0 points 0 points  Total Score 8 0 2    Immunization History  Administered  Date(s) Administered  . Fluad Quad(high Dose 65+) 06/15/2019  . Influenza Split 05/12/2009, 05/22/2012, 06/02/2016  . Influenza, High  Dose Seasonal PF 05/22/2012, 05/25/2013, 05/21/2014, 06/05/2015, 04/21/2017, 05/19/2018, 06/15/2019, 05/28/2020  . Influenza-Unspecified 05/18/2011, 06/02/2016, 04/21/2017, 05/19/2018  . Pneumococcal Conjugate-13 08/13/2016  . Pneumococcal Polysaccharide-23 02/03/2003, 07/31/2013  . Pneumococcal-Unspecified 02/03/2003, 07/31/2013  . Tdap 11/13/2010     Screening Tests Health Maintenance  Topic Date Due  . COVID-19 Vaccine (1) Never done  . Hepatitis C Screening  04/10/2021 (Originally 02-11-1941)  . TETANUS/TDAP  11/12/2020  . INFLUENZA VACCINE  Completed  . DEXA SCAN  Completed  . PNA vac Low Risk Adult  Completed    Cancer Screenings: Lung: Low Dose CT Chest recommended if Age 45-80 years, 30 pack-year currently smoking OR have quit w/in 15years. Patient does not qualify. Breast:  Up to date on Mammogram? Yes   Up to date of Bone Density/Dexa? No- declines Colorectal: N/A  Additional Screenings:  Hepatitis C Screening:      Plan:  Tiamarie was seen today for medicare annual wellness visit.  Diagnoses and all orders for this visit:  Encounter for Medicare annual wellness exam  Atrial fibrillation, unspecified type ([place]) -     warfarin (COUMADIN) 2 MG tablet; Take 1 tablet (2 mg total) by mouth daily.  History of DVT of lower extremity -     warfarin (COUMADIN) 2 MG tablet; Take 1 tablet (2 mg total) by mouth daily.  Dysuria -     POCT URINALYSIS DIP (CLINITEK) -     Urine Culture     I have personally reviewed and noted the following in the patient's chart:   . Medical and social history . Use of alcohol, tobacco or illicit drugs  . Current medications and supplements . Functional ability and status . Nutritional status . Physical activity . Advanced directives . List of other physicians . Hospitalizations, surgeries, and ER  visits in previous 12 months . Vitals . Screenings to include cognitive, depression, and falls . Referrals and appointments  In addition, I have reviewed and discussed with patient certain preventive protocols, quality metrics, and best practice recommendations. A written personalized care plan for preventive services as well as general preventive health recommendations were provided to patient.    This visit was conducted virtually in the setting of the [place] pandemic.    Orma Render, NP  07/14/2020

## 2020-07-14 NOTE — Patient Instructions (Signed)
Health Maintenance After Age 78 After age 78, you are at a higher risk for certain long-term diseases and infections as well as injuries from falls. Falls are a major cause of broken bones and head injuries in people who are older than age 78. Getting regular preventive care can help to keep you healthy and well. Preventive care includes getting regular testing and making lifestyle changes as recommended by your health care provider. Talk with your health care provider about:  Which screenings and tests you should have. A screening is a test that checks for a disease when you have no symptoms.  A diet and exercise plan that is right for you. What should I know about screenings and tests to prevent falls? Screening and testing are the best ways to find a health problem Gloria Mckay. [name] diagnosis and treatment give you the best chance of managing medical conditions that are common after age 78. Certain conditions and lifestyle choices may make you more likely to have a fall. Your health care provider may recommend:  Regular vision checks. Poor vision and conditions such as cataracts can make you more likely to have a fall. If you wear glasses, make sure to get your prescription updated if your vision changes.  Medicine review. Work with your health care provider to regularly review all of the medicines you are taking, including over-the-counter medicines. Ask your health care provider about any side effects that may make you more likely to have a fall. Tell your health care provider if any medicines that you take make you feel dizzy or sleepy.  Osteoporosis screening. Osteoporosis is a condition that causes the bones to get weaker. This can make the bones weak and cause them to break more easily.  Blood pressure screening. Blood pressure changes and medicines to control blood pressure can make you feel dizzy.  Strength and balance checks. Your health care provider may recommend certain tests to check your  strength and balance while standing, walking, or changing positions.  Foot health exam. Foot pain and numbness, as well as not wearing proper footwear, can make you more likely to have a fall.  Depression screening. You may be more likely to have a fall if you have a fear of falling, feel emotionally low, or feel unable to do activities that you used to do.  Alcohol use screening. Using too much alcohol can affect your balance and may make you more likely to have a fall. What actions can I take to lower my risk of falls? General instructions  Talk with your health care provider about your risks for falling. Tell your health care provider if: ? You fall. Be sure to tell your health care provider about all falls, even ones that seem minor. ? You feel dizzy, sleepy, or off-balance.  Take over-the-counter and prescription medicines only as told by your health care provider. These include any supplements.  Eat a healthy diet and maintain a healthy weight. A healthy diet includes low-fat dairy products, low-fat (lean) meats, and fiber from whole grains, beans, and lots of fruits and vegetables. Home safety  Remove any tripping hazards, such as rugs, cords, and clutter.  Install safety equipment such as grab bars in bathrooms and safety rails on stairs.  Keep rooms and walkways well-lit. Activity   Follow a regular exercise program to stay fit. This will help you maintain your balance. Ask your health care provider what types of exercise are appropriate for you.  If you need a cane or   walker, use it as recommended by your health care provider.  Wear supportive shoes that have nonskid soles. Lifestyle  Do not drink alcohol if your health care provider tells you not to drink.  If you drink alcohol, limit how much you have: ? 0-1 drink a day for women. ? 0-2 drinks a day for men.  Be aware of how much alcohol is in your drink. In the [place] one drink equals one typical bottle of beer (12  oz), one-half glass of wine (5 oz), or one shot of hard liquor (1 oz).  Do not use any products that contain nicotine or tobacco, such as cigarettes and e-cigarettes. If you need help quitting, ask your health care provider. Summary  Having a healthy lifestyle and getting preventive care can help to protect your health and wellness after age 78.  Screening and testing are the best way to find a health problem Gloria Mckay and help you avoid having a fall. Gloria Mckay diagnosis and treatment give you the best chance for managing medical conditions that are more common for people who are older than age 78.  Falls are a major cause of broken bones and head injuries in people who are older than age 78. Take precautions to prevent a fall at home.  Work with your health care provider to learn what changes you can make to improve your health and wellness and to prevent falls. This information is not intended to replace advice given to you by your health care provider. Make sure you discuss any questions you have with your health care provider. Document Revised: 12/07/2018 Document Reviewed: 06/29/2017 Elsevier Patient Education  2020 Elsevier Inc.  

## 2020-07-14 NOTE — Progress Notes (Signed)
Physical limitations and illness prevent regular exercise at this time.

## 2020-07-15 DIAGNOSIS — H3530 Unspecified macular degeneration: Secondary | ICD-10-CM | POA: Diagnosis not present

## 2020-07-15 DIAGNOSIS — R32 Unspecified urinary incontinence: Secondary | ICD-10-CM | POA: Diagnosis not present

## 2020-07-15 DIAGNOSIS — I4891 Unspecified atrial fibrillation: Secondary | ICD-10-CM | POA: Diagnosis not present

## 2020-07-15 DIAGNOSIS — I482 Chronic atrial fibrillation, unspecified: Secondary | ICD-10-CM | POA: Diagnosis not present

## 2020-07-15 DIAGNOSIS — M1990 Unspecified osteoarthritis, unspecified site: Secondary | ICD-10-CM | POA: Diagnosis not present

## 2020-07-15 DIAGNOSIS — N189 Chronic kidney disease, unspecified: Secondary | ICD-10-CM | POA: Diagnosis not present

## 2020-07-15 DIAGNOSIS — G8929 Other chronic pain: Secondary | ICD-10-CM | POA: Diagnosis not present

## 2020-07-15 DIAGNOSIS — Z7901 Long term (current) use of anticoagulants: Secondary | ICD-10-CM | POA: Diagnosis not present

## 2020-07-15 DIAGNOSIS — I129 Hypertensive chronic kidney disease with stage 1 through stage 4 chronic kidney disease, or unspecified chronic kidney disease: Secondary | ICD-10-CM | POA: Diagnosis not present

## 2020-07-15 NOTE — Progress Notes (Signed)
Urine does show the presence of trace blood and white blood cells, but I would like to wait for the culture to see if anything grows in the urine before starting another antibiotic. Some times we will see trace amounts of white blood cells chronically in older adults that don't actually cause infection.  Are you having any urinary symptoms still?  I will let you know as soon as the results come in and we will go from there.

## 2020-07-16 LAB — URINE CULTURE
MICRO NUMBER:: 11206998
SPECIMEN QUALITY:: ADEQUATE

## 2020-07-17 ENCOUNTER — Telehealth: Payer: Self-pay

## 2020-07-17 ENCOUNTER — Encounter: Payer: Medicare HMO | Admitting: Nurse Practitioner

## 2020-07-17 DIAGNOSIS — I4891 Unspecified atrial fibrillation: Secondary | ICD-10-CM | POA: Diagnosis not present

## 2020-07-17 DIAGNOSIS — R32 Unspecified urinary incontinence: Secondary | ICD-10-CM | POA: Diagnosis not present

## 2020-07-17 DIAGNOSIS — Z7901 Long term (current) use of anticoagulants: Secondary | ICD-10-CM | POA: Diagnosis not present

## 2020-07-17 DIAGNOSIS — M1990 Unspecified osteoarthritis, unspecified site: Secondary | ICD-10-CM | POA: Diagnosis not present

## 2020-07-17 DIAGNOSIS — N189 Chronic kidney disease, unspecified: Secondary | ICD-10-CM | POA: Diagnosis not present

## 2020-07-17 DIAGNOSIS — I482 Chronic atrial fibrillation, unspecified: Secondary | ICD-10-CM | POA: Diagnosis not present

## 2020-07-17 DIAGNOSIS — G8929 Other chronic pain: Secondary | ICD-10-CM | POA: Diagnosis not present

## 2020-07-17 DIAGNOSIS — I129 Hypertensive chronic kidney disease with stage 1 through stage 4 chronic kidney disease, or unspecified chronic kidney disease: Secondary | ICD-10-CM | POA: Diagnosis not present

## 2020-07-17 DIAGNOSIS — H3530 Unspecified macular degeneration: Secondary | ICD-10-CM | POA: Diagnosis not present

## 2020-07-17 NOTE — Telephone Encounter (Signed)
Contacted [place] from [facility], she was with pt at time of call. Aware of provider's recommendation and that warfarin 2 mg sent to [facility] on 07/14/20. Per nurse, due to the holiday weekend, next INR check will be on Wednesday.

## 2020-07-17 NOTE — Telephone Encounter (Signed)
Skip coumadin today (or tomorrow if already taken it) Restart at 3 mg (1.5 tablets of the 2 mg pills)  INR check next Thurs or Fri

## 2020-07-17 NOTE — Telephone Encounter (Signed)
[place] from [facility] called with critical INR results for pt.  INR: 5.3  Pt currently taking 4 mg daily. What are the changes to current warfarin therapy? Next INR check? Pls advise, thanks.

## 2020-07-20 ENCOUNTER — Encounter: Payer: Self-pay | Admitting: Osteopathic Medicine

## 2020-07-23 ENCOUNTER — Telehealth: Payer: Self-pay

## 2020-07-23 DIAGNOSIS — Z7901 Long term (current) use of anticoagulants: Secondary | ICD-10-CM | POA: Diagnosis not present

## 2020-07-23 DIAGNOSIS — I4891 Unspecified atrial fibrillation: Secondary | ICD-10-CM | POA: Diagnosis not present

## 2020-07-23 DIAGNOSIS — R32 Unspecified urinary incontinence: Secondary | ICD-10-CM | POA: Diagnosis not present

## 2020-07-23 DIAGNOSIS — M1990 Unspecified osteoarthritis, unspecified site: Secondary | ICD-10-CM | POA: Diagnosis not present

## 2020-07-23 DIAGNOSIS — I129 Hypertensive chronic kidney disease with stage 1 through stage 4 chronic kidney disease, or unspecified chronic kidney disease: Secondary | ICD-10-CM | POA: Diagnosis not present

## 2020-07-23 DIAGNOSIS — I482 Chronic atrial fibrillation, unspecified: Secondary | ICD-10-CM | POA: Diagnosis not present

## 2020-07-23 DIAGNOSIS — H3530 Unspecified macular degeneration: Secondary | ICD-10-CM | POA: Diagnosis not present

## 2020-07-23 DIAGNOSIS — G8929 Other chronic pain: Secondary | ICD-10-CM | POA: Diagnosis not present

## 2020-07-23 DIAGNOSIS — N189 Chronic kidney disease, unspecified: Secondary | ICD-10-CM | POA: Diagnosis not present

## 2020-07-23 NOTE — Telephone Encounter (Signed)
[place] advised, she will contact patient with updates

## 2020-07-23 NOTE — Telephone Encounter (Signed)
Received call from [place], [place] with [facility], stating patients INR today was 4.0  Last week INR was 5.8 so patient held x1 day then resumed at 3 mg daily.   Please advised

## 2020-07-23 NOTE — Telephone Encounter (Signed)
Hold another day Resume at 2 and alternate every other day w/ 3 mg  Recheck next weds/thurs

## 2020-07-23 NOTE — Telephone Encounter (Signed)
Gloria Mckay, no answer. Left VM for return call

## 2020-07-28 DIAGNOSIS — H4312 Vitreous hemorrhage, left eye: Secondary | ICD-10-CM | POA: Diagnosis not present

## 2020-07-28 DIAGNOSIS — H43812 Vitreous degeneration, left eye: Secondary | ICD-10-CM | POA: Diagnosis not present

## 2020-07-30 DIAGNOSIS — H3530 Unspecified macular degeneration: Secondary | ICD-10-CM | POA: Diagnosis not present

## 2020-07-30 DIAGNOSIS — I4891 Unspecified atrial fibrillation: Secondary | ICD-10-CM | POA: Diagnosis not present

## 2020-07-30 DIAGNOSIS — Z7901 Long term (current) use of anticoagulants: Secondary | ICD-10-CM | POA: Diagnosis not present

## 2020-07-30 DIAGNOSIS — I482 Chronic atrial fibrillation, unspecified: Secondary | ICD-10-CM | POA: Diagnosis not present

## 2020-07-30 DIAGNOSIS — M1990 Unspecified osteoarthritis, unspecified site: Secondary | ICD-10-CM | POA: Diagnosis not present

## 2020-07-30 DIAGNOSIS — R32 Unspecified urinary incontinence: Secondary | ICD-10-CM | POA: Diagnosis not present

## 2020-07-30 DIAGNOSIS — G8929 Other chronic pain: Secondary | ICD-10-CM | POA: Diagnosis not present

## 2020-07-30 DIAGNOSIS — N189 Chronic kidney disease, unspecified: Secondary | ICD-10-CM | POA: Diagnosis not present

## 2020-07-30 DIAGNOSIS — I129 Hypertensive chronic kidney disease with stage 1 through stage 4 chronic kidney disease, or unspecified chronic kidney disease: Secondary | ICD-10-CM | POA: Diagnosis not present

## 2020-07-31 ENCOUNTER — Telehealth: Payer: Self-pay

## 2020-07-31 DIAGNOSIS — G8929 Other chronic pain: Secondary | ICD-10-CM | POA: Diagnosis not present

## 2020-07-31 DIAGNOSIS — R32 Unspecified urinary incontinence: Secondary | ICD-10-CM | POA: Diagnosis not present

## 2020-07-31 DIAGNOSIS — N189 Chronic kidney disease, unspecified: Secondary | ICD-10-CM | POA: Diagnosis not present

## 2020-07-31 DIAGNOSIS — I4891 Unspecified atrial fibrillation: Secondary | ICD-10-CM | POA: Diagnosis not present

## 2020-07-31 DIAGNOSIS — I129 Hypertensive chronic kidney disease with stage 1 through stage 4 chronic kidney disease, or unspecified chronic kidney disease: Secondary | ICD-10-CM | POA: Diagnosis not present

## 2020-07-31 DIAGNOSIS — I482 Chronic atrial fibrillation, unspecified: Secondary | ICD-10-CM | POA: Diagnosis not present

## 2020-07-31 DIAGNOSIS — H3530 Unspecified macular degeneration: Secondary | ICD-10-CM | POA: Diagnosis not present

## 2020-07-31 DIAGNOSIS — Z7901 Long term (current) use of anticoagulants: Secondary | ICD-10-CM | POA: Diagnosis not present

## 2020-07-31 DIAGNOSIS — M1990 Unspecified osteoarthritis, unspecified site: Secondary | ICD-10-CM | POA: Diagnosis not present

## 2020-07-31 LAB — POCT INR: INR: 1.3 — AB (ref 0.9–1.1)

## 2020-07-31 NOTE — Telephone Encounter (Signed)
I have tried to call the home health nurse, no answer.

## 2020-07-31 NOTE — Telephone Encounter (Signed)
New instructions:  Thu (today) 3 Fri 3 Sat 2 Sun 3 Mon 3 Tue 2 Wed 3 Thu (next week) recheck INR   (Please be sure we are abstracting these INR values into Epic, I can see in the phone encounters but we need to eb putting these into the lab results)

## 2020-07-31 NOTE — Telephone Encounter (Signed)
I called patient and advised of recommendations. Abstracted labs.

## 2020-07-31 NOTE — Telephone Encounter (Signed)
[place] with [facility] called and left a message stating her INR was 1.3 today. She did hold another day and then went on alternating 2 mg and 3 mg daily.

## 2020-07-31 NOTE — Telephone Encounter (Signed)
[place] with [facility] advised.

## 2020-08-04 ENCOUNTER — Other Ambulatory Visit: Payer: Self-pay | Admitting: Osteopathic Medicine

## 2020-08-04 DIAGNOSIS — M48061 Spinal stenosis, lumbar region without neurogenic claudication: Secondary | ICD-10-CM

## 2020-08-05 DIAGNOSIS — I482 Chronic atrial fibrillation, unspecified: Secondary | ICD-10-CM | POA: Diagnosis not present

## 2020-08-05 DIAGNOSIS — G8929 Other chronic pain: Secondary | ICD-10-CM | POA: Diagnosis not present

## 2020-08-05 DIAGNOSIS — R32 Unspecified urinary incontinence: Secondary | ICD-10-CM | POA: Diagnosis not present

## 2020-08-05 DIAGNOSIS — Z7901 Long term (current) use of anticoagulants: Secondary | ICD-10-CM | POA: Diagnosis not present

## 2020-08-05 DIAGNOSIS — I129 Hypertensive chronic kidney disease with stage 1 through stage 4 chronic kidney disease, or unspecified chronic kidney disease: Secondary | ICD-10-CM | POA: Diagnosis not present

## 2020-08-05 DIAGNOSIS — I4891 Unspecified atrial fibrillation: Secondary | ICD-10-CM | POA: Diagnosis not present

## 2020-08-05 DIAGNOSIS — M1990 Unspecified osteoarthritis, unspecified site: Secondary | ICD-10-CM | POA: Diagnosis not present

## 2020-08-05 DIAGNOSIS — N189 Chronic kidney disease, unspecified: Secondary | ICD-10-CM | POA: Diagnosis not present

## 2020-08-05 DIAGNOSIS — H3530 Unspecified macular degeneration: Secondary | ICD-10-CM | POA: Diagnosis not present

## 2020-08-07 ENCOUNTER — Telehealth: Payer: Self-pay

## 2020-08-07 DIAGNOSIS — Z7901 Long term (current) use of anticoagulants: Secondary | ICD-10-CM | POA: Diagnosis not present

## 2020-08-07 DIAGNOSIS — N189 Chronic kidney disease, unspecified: Secondary | ICD-10-CM | POA: Diagnosis not present

## 2020-08-07 DIAGNOSIS — R32 Unspecified urinary incontinence: Secondary | ICD-10-CM | POA: Diagnosis not present

## 2020-08-07 DIAGNOSIS — G8929 Other chronic pain: Secondary | ICD-10-CM | POA: Diagnosis not present

## 2020-08-07 DIAGNOSIS — H3530 Unspecified macular degeneration: Secondary | ICD-10-CM | POA: Diagnosis not present

## 2020-08-07 DIAGNOSIS — I4891 Unspecified atrial fibrillation: Secondary | ICD-10-CM | POA: Diagnosis not present

## 2020-08-07 DIAGNOSIS — I129 Hypertensive chronic kidney disease with stage 1 through stage 4 chronic kidney disease, or unspecified chronic kidney disease: Secondary | ICD-10-CM | POA: Diagnosis not present

## 2020-08-07 DIAGNOSIS — I482 Chronic atrial fibrillation, unspecified: Secondary | ICD-10-CM | POA: Diagnosis not present

## 2020-08-07 DIAGNOSIS — M1990 Unspecified osteoarthritis, unspecified site: Secondary | ICD-10-CM | POA: Diagnosis not present

## 2020-08-07 LAB — POCT INR: INR: 1.1 (ref 0.9–1.1)

## 2020-08-07 NOTE — Telephone Encounter (Signed)
[place] from [facility] called with INR results. She states she is taking the coumadin as directed, no missed doses. No change in diet or change in medication.   INR 1.1

## 2020-08-07 NOTE — Telephone Encounter (Signed)
Patient and home health been advised. Confirmed dosing

## 2020-08-07 NOTE — Telephone Encounter (Signed)
Previous instructions 07/31/20:  Thu 07/31/20: 3 mg Fri 3 Sat 2 Sun 3 Mon 3 Tue 2 Wed 3  Thu (next week) recheck INR   Today, Thu 08/07/20 Please confirm above dosing Thu 08/07/20: 3 mg Fri 3 Sat 3 Sun 3 Mon 3 Tue 2 Wed 3  Thu (next week) recheck INR

## 2020-08-11 ENCOUNTER — Other Ambulatory Visit: Payer: Self-pay | Admitting: Medical-Surgical

## 2020-08-11 DIAGNOSIS — I10 Essential (primary) hypertension: Secondary | ICD-10-CM | POA: Diagnosis not present

## 2020-08-11 DIAGNOSIS — D638 Anemia in other chronic diseases classified elsewhere: Secondary | ICD-10-CM | POA: Diagnosis not present

## 2020-08-11 DIAGNOSIS — N184 Chronic kidney disease, stage 4 (severe): Secondary | ICD-10-CM | POA: Diagnosis not present

## 2020-08-11 DIAGNOSIS — E559 Vitamin D deficiency, unspecified: Secondary | ICD-10-CM | POA: Diagnosis not present

## 2020-08-14 ENCOUNTER — Telehealth: Payer: Self-pay

## 2020-08-14 DIAGNOSIS — G8929 Other chronic pain: Secondary | ICD-10-CM | POA: Diagnosis not present

## 2020-08-14 DIAGNOSIS — H3530 Unspecified macular degeneration: Secondary | ICD-10-CM | POA: Diagnosis not present

## 2020-08-14 DIAGNOSIS — R32 Unspecified urinary incontinence: Secondary | ICD-10-CM | POA: Diagnosis not present

## 2020-08-14 DIAGNOSIS — M1990 Unspecified osteoarthritis, unspecified site: Secondary | ICD-10-CM | POA: Diagnosis not present

## 2020-08-14 DIAGNOSIS — I4891 Unspecified atrial fibrillation: Secondary | ICD-10-CM | POA: Diagnosis not present

## 2020-08-14 DIAGNOSIS — N189 Chronic kidney disease, unspecified: Secondary | ICD-10-CM | POA: Diagnosis not present

## 2020-08-14 DIAGNOSIS — Z7901 Long term (current) use of anticoagulants: Secondary | ICD-10-CM | POA: Diagnosis not present

## 2020-08-14 DIAGNOSIS — I482 Chronic atrial fibrillation, unspecified: Secondary | ICD-10-CM | POA: Diagnosis not present

## 2020-08-14 DIAGNOSIS — I129 Hypertensive chronic kidney disease with stage 1 through stage 4 chronic kidney disease, or unspecified chronic kidney disease: Secondary | ICD-10-CM | POA: Diagnosis not present

## 2020-08-14 LAB — POCT INR: INR: 1.1 (ref 0.9–1.1)

## 2020-08-14 NOTE — Telephone Encounter (Signed)
[place] with [facility] advised.

## 2020-08-14 NOTE — Telephone Encounter (Signed)
V[place] from [facility] called with INR results.   She reports taking 3 mg daily except 2 mg on Tuesday. No changes in medications or diet.   INR 1.1 PT 13.6  Abstracted INR.

## 2020-08-14 NOTE — Telephone Encounter (Signed)
Change to 3 mg daily  Recheck INR next week Weds

## 2020-08-20 DIAGNOSIS — H43812 Vitreous degeneration, left eye: Secondary | ICD-10-CM | POA: Diagnosis not present

## 2020-08-21 ENCOUNTER — Telehealth: Payer: Self-pay

## 2020-08-21 DIAGNOSIS — N189 Chronic kidney disease, unspecified: Secondary | ICD-10-CM | POA: Diagnosis not present

## 2020-08-21 DIAGNOSIS — H3530 Unspecified macular degeneration: Secondary | ICD-10-CM | POA: Diagnosis not present

## 2020-08-21 DIAGNOSIS — R32 Unspecified urinary incontinence: Secondary | ICD-10-CM | POA: Diagnosis not present

## 2020-08-21 DIAGNOSIS — I4891 Unspecified atrial fibrillation: Secondary | ICD-10-CM | POA: Diagnosis not present

## 2020-08-21 DIAGNOSIS — G8929 Other chronic pain: Secondary | ICD-10-CM | POA: Diagnosis not present

## 2020-08-21 DIAGNOSIS — Z7901 Long term (current) use of anticoagulants: Secondary | ICD-10-CM | POA: Diagnosis not present

## 2020-08-21 DIAGNOSIS — I482 Chronic atrial fibrillation, unspecified: Secondary | ICD-10-CM | POA: Diagnosis not present

## 2020-08-21 DIAGNOSIS — I129 Hypertensive chronic kidney disease with stage 1 through stage 4 chronic kidney disease, or unspecified chronic kidney disease: Secondary | ICD-10-CM | POA: Diagnosis not present

## 2020-08-21 DIAGNOSIS — M1990 Unspecified osteoarthritis, unspecified site: Secondary | ICD-10-CM | POA: Diagnosis not present

## 2020-08-21 LAB — POCT INR: INR: 1.2 — AB (ref 0.9–1.1)

## 2020-08-21 NOTE — Telephone Encounter (Signed)
Gigi from [facility] called with INR results. She reports taking coumadin as directed and no change in diet. She is taking 3 mg daily.   INR 1.2 PT 14

## 2020-08-21 NOTE — Telephone Encounter (Signed)
3 mg Sun, Mon, Wed, Thu, Sat 4 mg Tue, Fri  Recheck in a week

## 2020-08-21 NOTE — Telephone Encounter (Signed)
Patient and home health advised.

## 2020-08-28 ENCOUNTER — Telehealth: Payer: Self-pay

## 2020-08-28 DIAGNOSIS — I129 Hypertensive chronic kidney disease with stage 1 through stage 4 chronic kidney disease, or unspecified chronic kidney disease: Secondary | ICD-10-CM | POA: Diagnosis not present

## 2020-08-28 DIAGNOSIS — N189 Chronic kidney disease, unspecified: Secondary | ICD-10-CM | POA: Diagnosis not present

## 2020-08-28 DIAGNOSIS — R32 Unspecified urinary incontinence: Secondary | ICD-10-CM | POA: Diagnosis not present

## 2020-08-28 DIAGNOSIS — Z7901 Long term (current) use of anticoagulants: Secondary | ICD-10-CM | POA: Diagnosis not present

## 2020-08-28 DIAGNOSIS — G8929 Other chronic pain: Secondary | ICD-10-CM | POA: Diagnosis not present

## 2020-08-28 DIAGNOSIS — I4891 Unspecified atrial fibrillation: Secondary | ICD-10-CM | POA: Diagnosis not present

## 2020-08-28 DIAGNOSIS — M1990 Unspecified osteoarthritis, unspecified site: Secondary | ICD-10-CM | POA: Diagnosis not present

## 2020-08-28 DIAGNOSIS — H3530 Unspecified macular degeneration: Secondary | ICD-10-CM | POA: Diagnosis not present

## 2020-08-28 DIAGNOSIS — I482 Chronic atrial fibrillation, unspecified: Secondary | ICD-10-CM | POA: Diagnosis not present

## 2020-08-28 NOTE — Telephone Encounter (Signed)
4 mg Mon, Wed, Thu, Sat 3 mg Tue, Fri, Sun Recheck in a week

## 2020-08-28 NOTE — Telephone Encounter (Signed)
[place] from [facility] called regarding patient's INR results.  INR - 1.1 PT - 13.8  Patient currently taking 3 mg Sun/ Mon/ Wed/ Thu/ Sat 4 mg on Tue/Fri. Does provider need to adjust current warfarin therapy or should patient continue with current regimen? Pls advise, thanks.

## 2020-08-28 NOTE — Telephone Encounter (Signed)
Spoke to [place] regarding provider's recommendation. Per [place], she educated patient to not take her Carafate rx at the same time of her warfarin intake. [place] will update pt on current daily intake of warfarin. RN will contact the clinic with pt's weekly INR/PT results. She is aware that provider will be out of the office next week. No other inquiries during the call.

## 2020-09-04 ENCOUNTER — Telehealth: Payer: Self-pay

## 2020-09-04 DIAGNOSIS — H3530 Unspecified macular degeneration: Secondary | ICD-10-CM | POA: Diagnosis not present

## 2020-09-04 DIAGNOSIS — Z7901 Long term (current) use of anticoagulants: Secondary | ICD-10-CM | POA: Diagnosis not present

## 2020-09-04 DIAGNOSIS — N189 Chronic kidney disease, unspecified: Secondary | ICD-10-CM | POA: Diagnosis not present

## 2020-09-04 DIAGNOSIS — G8929 Other chronic pain: Secondary | ICD-10-CM | POA: Diagnosis not present

## 2020-09-04 DIAGNOSIS — I129 Hypertensive chronic kidney disease with stage 1 through stage 4 chronic kidney disease, or unspecified chronic kidney disease: Secondary | ICD-10-CM | POA: Diagnosis not present

## 2020-09-04 DIAGNOSIS — I4891 Unspecified atrial fibrillation: Secondary | ICD-10-CM | POA: Diagnosis not present

## 2020-09-04 DIAGNOSIS — I482 Chronic atrial fibrillation, unspecified: Secondary | ICD-10-CM | POA: Diagnosis not present

## 2020-09-04 DIAGNOSIS — M1990 Unspecified osteoarthritis, unspecified site: Secondary | ICD-10-CM | POA: Diagnosis not present

## 2020-09-04 LAB — POCT INR: INR: 1.2 — AB (ref 0.9–1.1)

## 2020-09-04 NOTE — Telephone Encounter (Signed)
Gloria Mckay with [facility] called and left a message reporting INR results. She is taking 3 mg daily except 4 mg on Tuesday and Friday.   INR 1.2  Abstracted results.

## 2020-09-04 NOTE — Telephone Encounter (Signed)
Current Dosing: Sun: 3mg  Mon: 3mg  Tues: 4mg  Wed: 3mg  Thurs: 3mg  Fri: 4mg  Sat:3mg  Total dose 23mg   New dosing: Sun: 4mg  Mon: 3mg  Tues: 4 mg Wed: 3mg  Thurs: 4mg  Fri: 3mg  Sat: 4mg  Total dose 25mg   Recheck in 1 week

## 2020-09-05 NOTE — Telephone Encounter (Signed)
Patient and home health advised.

## 2020-09-09 ENCOUNTER — Other Ambulatory Visit: Payer: Self-pay | Admitting: Osteopathic Medicine

## 2020-09-09 DIAGNOSIS — M48061 Spinal stenosis, lumbar region without neurogenic claudication: Secondary | ICD-10-CM

## 2020-09-11 ENCOUNTER — Telehealth: Payer: Self-pay

## 2020-09-11 DIAGNOSIS — I482 Chronic atrial fibrillation, unspecified: Secondary | ICD-10-CM | POA: Diagnosis not present

## 2020-09-11 DIAGNOSIS — N189 Chronic kidney disease, unspecified: Secondary | ICD-10-CM | POA: Diagnosis not present

## 2020-09-11 DIAGNOSIS — I129 Hypertensive chronic kidney disease with stage 1 through stage 4 chronic kidney disease, or unspecified chronic kidney disease: Secondary | ICD-10-CM | POA: Diagnosis not present

## 2020-09-11 DIAGNOSIS — Z7901 Long term (current) use of anticoagulants: Secondary | ICD-10-CM | POA: Diagnosis not present

## 2020-09-11 DIAGNOSIS — I4891 Unspecified atrial fibrillation: Secondary | ICD-10-CM | POA: Diagnosis not present

## 2020-09-11 DIAGNOSIS — G8929 Other chronic pain: Secondary | ICD-10-CM | POA: Diagnosis not present

## 2020-09-11 DIAGNOSIS — H3530 Unspecified macular degeneration: Secondary | ICD-10-CM | POA: Diagnosis not present

## 2020-09-11 DIAGNOSIS — M1990 Unspecified osteoarthritis, unspecified site: Secondary | ICD-10-CM | POA: Diagnosis not present

## 2020-09-11 NOTE — Telephone Encounter (Signed)
[place] with [facility] called and left a message with INR results.   Current dosing-   Sun: 4mg  Mon: 3mg  Tues: 4 mg Wed: 3mg  Thurs: 4mg  Fri: 3mg  Sat: 4mg  Total dose 25mg   INR 1.3

## 2020-09-11 NOTE — Telephone Encounter (Signed)
4 mg daily Recheck 1 week

## 2020-09-11 NOTE — Telephone Encounter (Signed)
Patient and [place] advised of recommendations.

## 2020-09-18 ENCOUNTER — Telehealth: Payer: Self-pay

## 2020-09-18 DIAGNOSIS — I4891 Unspecified atrial fibrillation: Secondary | ICD-10-CM | POA: Diagnosis not present

## 2020-09-18 DIAGNOSIS — I482 Chronic atrial fibrillation, unspecified: Secondary | ICD-10-CM | POA: Diagnosis not present

## 2020-09-18 DIAGNOSIS — N189 Chronic kidney disease, unspecified: Secondary | ICD-10-CM | POA: Diagnosis not present

## 2020-09-18 DIAGNOSIS — M1990 Unspecified osteoarthritis, unspecified site: Secondary | ICD-10-CM | POA: Diagnosis not present

## 2020-09-18 DIAGNOSIS — G8929 Other chronic pain: Secondary | ICD-10-CM | POA: Diagnosis not present

## 2020-09-18 DIAGNOSIS — Z7901 Long term (current) use of anticoagulants: Secondary | ICD-10-CM | POA: Diagnosis not present

## 2020-09-18 DIAGNOSIS — H3530 Unspecified macular degeneration: Secondary | ICD-10-CM | POA: Diagnosis not present

## 2020-09-18 DIAGNOSIS — I129 Hypertensive chronic kidney disease with stage 1 through stage 4 chronic kidney disease, or unspecified chronic kidney disease: Secondary | ICD-10-CM | POA: Diagnosis not present

## 2020-09-18 NOTE — Telephone Encounter (Signed)
Take 4 mg (whole tablet) daily plus additional 0.5 tablet (total dose 6 mg) on Mon, Wed, Fri - recheck in one week

## 2020-09-18 NOTE — Telephone Encounter (Signed)
[place] with [facility] called with INR results. She is taking 4 mg of Coumadin daily.   INR 1.3

## 2020-09-19 NOTE — Telephone Encounter (Signed)
Gloria Mckay and home health advised of recommendations.

## 2020-09-25 ENCOUNTER — Telehealth: Payer: Self-pay

## 2020-09-25 ENCOUNTER — Other Ambulatory Visit: Payer: Self-pay | Admitting: Osteopathic Medicine

## 2020-09-25 DIAGNOSIS — I482 Chronic atrial fibrillation, unspecified: Secondary | ICD-10-CM | POA: Diagnosis not present

## 2020-09-25 DIAGNOSIS — G8929 Other chronic pain: Secondary | ICD-10-CM | POA: Diagnosis not present

## 2020-09-25 DIAGNOSIS — N189 Chronic kidney disease, unspecified: Secondary | ICD-10-CM | POA: Diagnosis not present

## 2020-09-25 DIAGNOSIS — M1990 Unspecified osteoarthritis, unspecified site: Secondary | ICD-10-CM | POA: Diagnosis not present

## 2020-09-25 DIAGNOSIS — I129 Hypertensive chronic kidney disease with stage 1 through stage 4 chronic kidney disease, or unspecified chronic kidney disease: Secondary | ICD-10-CM | POA: Diagnosis not present

## 2020-09-25 DIAGNOSIS — Z7901 Long term (current) use of anticoagulants: Secondary | ICD-10-CM | POA: Diagnosis not present

## 2020-09-25 DIAGNOSIS — H3530 Unspecified macular degeneration: Secondary | ICD-10-CM | POA: Diagnosis not present

## 2020-09-25 DIAGNOSIS — I4891 Unspecified atrial fibrillation: Secondary | ICD-10-CM | POA: Diagnosis not present

## 2020-09-25 NOTE — Telephone Encounter (Signed)
Gloria Mckay from [facility] called with pt's INR results:  INR: 1.6   The RN did not include any information regarding pt's current warfarin therapy.

## 2020-09-25 NOTE — Telephone Encounter (Signed)
Please call [facility] back or call the patient, we need to confirm what dosage of warfarin she is taking and make sure it aligns with last instructions (from 09/18/2020 "Take 4 mg (whole tablet) daily plus additional 0.5 tablet (total dose 6 mg) on Mon, Wed, Fri - recheck in one week ")  If she is taking this dose, please instruct her to increase to 6 mg daily, let me know if she needs other pills sent in

## 2020-09-26 NOTE — Telephone Encounter (Signed)
Per Lawerance Bach - pt was taking 4 mg/2 days and 6 mg/the rest of the week. RN advised of provider's instruction. She will contact patient regarding warfarin therapy is 6 mg daily. She will contact the clinic with next week's INR results as scheduled.

## 2020-10-02 ENCOUNTER — Telehealth: Payer: Self-pay

## 2020-10-02 NOTE — Telephone Encounter (Signed)
[place] with [facility] called and left a message with INR results.   I called Daralyn and she states she is taking 4 mg daily except Monday, Wednesday and Friday 6 mg. She states she never received a call last week to increase to 6 mg daily.   INR - 1.4

## 2020-10-03 NOTE — Telephone Encounter (Signed)
Patient advised.

## 2020-10-03 NOTE — Telephone Encounter (Signed)
Cool! Let's mae sure she's taking 6 mg daily and then recheck again in a week

## 2020-10-03 NOTE — Telephone Encounter (Signed)
Task completed. Pt/Gloria Mckay has been updated to take 6 mg of her warfarin daily. Verbalized understanding. No other inquiries during the calls.

## 2020-10-10 ENCOUNTER — Ambulatory Visit (INDEPENDENT_AMBULATORY_CARE_PROVIDER_SITE_OTHER): Payer: Medicare Other | Admitting: Osteopathic Medicine

## 2020-10-10 ENCOUNTER — Other Ambulatory Visit: Payer: Self-pay

## 2020-10-10 VITALS — BP 134/61 | HR 58 | Ht 63.0 in | Wt 106.0 lb

## 2020-10-10 DIAGNOSIS — D6859 Other primary thrombophilia: Secondary | ICD-10-CM | POA: Diagnosis not present

## 2020-10-10 DIAGNOSIS — I4811 Longstanding persistent atrial fibrillation: Secondary | ICD-10-CM

## 2020-10-10 DIAGNOSIS — Z7901 Long term (current) use of anticoagulants: Secondary | ICD-10-CM

## 2020-10-10 LAB — POCT INR: INR: 2.7 (ref 2.0–3.0)

## 2020-10-10 NOTE — Progress Notes (Signed)
Patient reports taking Coumadin as follows:  6 mg daily   Gloria Mckay denies any missed doses of medication, extra doses, bleeding gums, nose bleeds, antibiotic use, hospitalization, blood in urine, blood in stool, dental procedures, any bruising, abnormal bleeding, or medication changes.  INR was checked today with a reading of 2.7. Per Dr. Sheppard Coil, continue current dosage and follow up in one week for repeat INR.

## 2020-10-14 ENCOUNTER — Other Ambulatory Visit: Payer: Self-pay | Admitting: Osteopathic Medicine

## 2020-10-14 DIAGNOSIS — M48061 Spinal stenosis, lumbar region without neurogenic claudication: Secondary | ICD-10-CM

## 2020-10-17 ENCOUNTER — Other Ambulatory Visit: Payer: Self-pay

## 2020-10-17 ENCOUNTER — Ambulatory Visit (INDEPENDENT_AMBULATORY_CARE_PROVIDER_SITE_OTHER): Payer: Medicare Other | Admitting: Medical-Surgical

## 2020-10-17 VITALS — BP 131/73 | HR 59 | Temp 97.8°F | Wt 106.0 lb

## 2020-10-17 DIAGNOSIS — D6859 Other primary thrombophilia: Secondary | ICD-10-CM | POA: Diagnosis not present

## 2020-10-17 DIAGNOSIS — I4811 Longstanding persistent atrial fibrillation: Secondary | ICD-10-CM

## 2020-10-17 DIAGNOSIS — Z7901 Long term (current) use of anticoagulants: Secondary | ICD-10-CM | POA: Diagnosis not present

## 2020-10-17 LAB — POCT INR: INR: 2.7 (ref 2.0–3.0)

## 2020-10-17 NOTE — Progress Notes (Signed)
Pt states B12 should be checked regularly per her house nurse.   FYI: Oncology is monitoring her bone marrow for leukemia. They will begin to draw her INR every 2 weeks and forward the results to PCP.  Pt reports no missing doses, diety changes, or abnormal bleeding.

## 2020-10-17 NOTE — Progress Notes (Signed)
INR 2.7 today, with in goal.  Continue current regimen. Clearnce Sorrel, DNP, APRN, FNP-BC [facility]

## 2020-10-31 ENCOUNTER — Telehealth: Payer: Self-pay

## 2020-10-31 ENCOUNTER — Other Ambulatory Visit: Payer: Self-pay | Admitting: Osteopathic Medicine

## 2020-10-31 ENCOUNTER — Ambulatory Visit (INDEPENDENT_AMBULATORY_CARE_PROVIDER_SITE_OTHER): Payer: Medicare Other | Admitting: Medical-Surgical

## 2020-10-31 ENCOUNTER — Other Ambulatory Visit: Payer: Self-pay

## 2020-10-31 VITALS — BP 137/67 | HR 56 | Temp 98.0°F | Resp 20 | Ht 63.0 in | Wt 108.0 lb

## 2020-10-31 DIAGNOSIS — Z7901 Long term (current) use of anticoagulants: Secondary | ICD-10-CM | POA: Diagnosis not present

## 2020-10-31 DIAGNOSIS — I4811 Longstanding persistent atrial fibrillation: Secondary | ICD-10-CM

## 2020-10-31 DIAGNOSIS — D6859 Other primary thrombophilia: Secondary | ICD-10-CM

## 2020-10-31 LAB — POCT INR: INR: 1.7 — AB (ref 2.0–3.0)

## 2020-10-31 NOTE — Progress Notes (Signed)
Please call patient with change in Coumadin to 7.5mg  on Monday, Wednesday, and Friday. All other days stay at 6mg  daily. Return for INR check in 1 week.

## 2020-10-31 NOTE — Telephone Encounter (Signed)
Pt's daughter Reita May would like [place] to call her with any results or recommendations for Gloria Mckay. Pt agreed. [phone]

## 2020-10-31 NOTE — Progress Notes (Signed)
Pt's INR today is 1.7, previous INR was 2.7 on 10/17/20. Pt reports no missing doses, diety changes, or abnormal bleeding.   Established Patient Office Visit  Subjective:  Patient ID: Gloria Mckay, female    DOB: 16-Apr-1941  Age: 80 y.o. MRN: [id number]  CC:  Chief Complaint  Patient presents with  . Anticoagulation    HPI Gloria Mckay presents for INR check. Pt's INR today is 1.7, previous INR was 2.7 on 10/17/20. Pt reports no missing doses, diety changes, or abnormal bleeding.  Past Medical History:  Diagnosis Date  . Arthritis   . Cancer ([place])   . Cataract    bilaterally  . Hypertension   . Myelodysplasia (myelodysplastic syndrome) ([place])   . Myelodysplastic syndrome [facility])     Past Surgical History:  Procedure Laterality Date  . JOINT REPLACEMENT    . STOMACH SURGERY      Family History  Problem Relation Age of Onset  . Hypertension Mother   . Pulmonary embolism Mother   . Heart disease Father     Social History   Socioeconomic History  . Marital status: Widowed    Spouse name: Not on file  . Number of children: 4  . Years of education: 70  . Highest education level: 12th grade  Occupational History  . 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 Smoking status: Never Smoker  . Smokeless tobacco: Never Used  Vaping Use  . Vaping Use: Never used  Substance and Sexual Activity  . Alcohol use: No  . Drug use: No  . Sexual activity: Not Currently  Other Topics Concern  . Not on file  Social History Narrative   Recent illness resulting in decreased ability to ambulate causing social isolation.    Social Determinants of Health   Financial Resource Strain: Low Risk   . Difficulty of Paying Living Expenses: Not hard at all  Food Insecurity: No Food Insecurity  . Worried About Charity fundraiser in the Last Year: Never true  . Ran Out of Food in the Last Year: Never true  Transportation Needs: No Transportation Needs  . Lack of  Transportation (Medical): No  . Lack of Transportation (Non-Medical): No  Physical Activity: Inactive  . Days of Exercise per Week: 0 days  . Minutes of Exercise per Session: 0 min  Stress: Stress Concern Present  . Feeling of Stress : To some extent  Social Connections: Socially Isolated  . Frequency of Communication with Friends and Family: More than three times a week  . Frequency of Social Gatherings with Friends and Family: More than three times a week  . Attends Religious Services: Never  . Active Member of Clubs or Organizations: No  . Attends Archivist Meetings: Never  . Marital Status: Widowed  Intimate Partner Violence: Not At Risk  . Fear of Current or Ex-Partner: No  . Emotionally Abused: No  . Physically Abused: No  . Sexually Abused: No    Outpatient Medications Prior to Visit  Medication Sig Dispense Refill  . AMBULATORY NON FORMULARY MEDICATION Medication Name: rollator walker with seat per patient preference and insurance coverage 1 Units prn  . amLODipine (NORVASC) 10 MG tablet Take 1 tablet (10 mg total) by mouth daily. 90 tablet 3  . Calcium Carbonate-Vitamin D 600-400 MG-UNIT chew tablet Chew 2 tablets by mouth daily. 60 tablet 12  . carvedilol (COREG) 25 MG tablet Take 1 tablet (25 mg total) by mouth 2 (  two) times daily with a meal. 180 tablet 0  . cyanocobalamin (,VITAMIN B-12,) 1000 MCG/ML injection Inject 1 mL (1,000 mcg total) into the muscle every 30 (thirty) days. Please include syringes and needles as needed for monthly injections 30 mL 11  . denosumab (PROLIA) 60 MG/ML SOSY injection Inject 60 mg into the skin every 6 (six) months. Administer in upper arm, thigh, or abdomen 1 Syringe 1  . esomeprazole (NEXIUM) 40 MG capsule Take 1 capsule (40 mg total) by mouth 2 (two) times daily before a meal. 60 capsule 3  . hydrALAZINE (APRESOLINE) 25 MG tablet Take 1 tablet (25 mg total) by mouth 2 (two) times daily. 180 tablet 3  .  HYDROcodone-acetaminophen (NORCO) 10-325 MG tablet Take 1 tablet by mouth 3 (three) times daily as needed for moderate pain or severe pain. 60 tablet 0  . hydrOXYzine (ATARAX/VISTARIL) 25 MG tablet Take 0.5-1 tablets (12.5-25 mg total) by mouth every 8 (eight) hours as needed for itching. 30 tablet 0  . losartan (COZAAR) 100 MG tablet Take 1 tablet (100 mg total) by mouth daily. 90 tablet 1  . nitrofurantoin, macrocrystal-monohydrate, (MACROBID) 100 MG capsule Take 1 capsule (100 mg total) by mouth 2 (two) times daily. 14 capsule 0  . Omega-3 Fatty Acids ([employer] FISH OIL PO) Take by mouth.    . Prasterone (INTRAROSA) 6.5 MG INST Place 1 suppository vaginally at bedtime. 30 each 12  . sucralfate (CARAFATE) 1 g tablet Take 1 tablet by mouth 4 (four) times daily -  with meals and at bedtime.    . topiramate (TOPAMAX) 100 MG tablet Take 1.5 tabs (150 mg) in AM and 2 tabs (200 mg) in PM 90 tablet 1  . Vitamin D, Ergocalciferol, (DRISDOL) 1.25 MG (50000 UT) CAPS capsule Take 1 capsule (50,000 Units total) by mouth every 7 (seven) days. 12 capsule 1  . warfarin (COUMADIN) 2 MG tablet Take 1 tablet (2 mg total) by mouth daily. 30 tablet 3  . warfarin (COUMADIN) 4 MG tablet Take 1 tablet (4mg ) by mouth on Monday, Wednesday, Friday 30 tablet 0   No facility-administered medications prior to visit.    Allergies  Allergen Reactions  . Avocado Shortness Of Breath  . Cefuroxime Anaphylaxis    Difficulty swallowing pills because they were too dry.  Caused tablet dysphagia.  . Diazepam Shortness Of Breath    HYPERVENTILATION     . Diphenhydramine Palpitations    Affected heart rate/er told her not to take again  . Latex Rash and Shortness Of Breath    Airway swelling  . Other Palpitations    Affected heart rate/er told her not to take again  . Amlodipine Cough and Other (See Comments)    Not sure  . Butorphanol Hives  . Diclofenac Hives  . Diclofenac Sodium Hives  . Diphenhydramine Hcl Palpitations     Affected heart rate/er told her not to take again  . Methylpyrrolidone Hives  . Ondansetron Hives and Other (See Comments)    Sedates/knocks her out    . Oxycodone Hives  . Penicillins Hives and Swelling    Most mycin drugs  . Quinolones Other (See Comments)    Joint pain  . Sulfa Antibiotics Hives and Swelling  . Tomato Other (See Comments)    Broke out inside of mouth  . Butorphanol Tartrate Hives  . Lisinopril Cough    Caused pt to cough  . Nexium [Esomeprazole Magnesium] Diarrhea and Nausea And Vomiting  . Ramipril Cough  Pt c/o cough  . Ace Inhibitors Other (See Comments) and Cough    Lisinopril and ramipril   . Ciprofloxacin Rash    Patient prefers not to take Fluoroquinolones   . Codeine Nausea Only  . Pantoprazole Sodium Diarrhea    ROS Review of Systems    Objective:    Physical Exam  BP 137/67 (BP Location: Left Arm, Patient Position: Sitting, Cuff Size: Normal)   Pulse (!) 56   Temp 98 F (36.7 C) (Temporal)   Resp 20   Ht 5\' 3"  (1.6 m)   Wt 108 lb (49 kg)   SpO2 100%   BMI 19.13 kg/m  Wt Readings from Last 3 Encounters:  10/31/20 108 lb (49 kg)  10/17/20 106 lb (48.1 kg)  10/10/20 106 lb (48.1 kg)     Health Maintenance Due  Topic Date Due  . COVID-19 Vaccine (1) Never done    There are no preventive care reminders to display for this patient.  Lab Results  Component Value Date   TSH 1.65 02/08/2020   Lab Results  Component Value Date   WBC 7.7 02/08/2020   HGB 10.1 (L) 02/08/2020   HCT 32.7 (L) 02/08/2020   MCV 90.3 02/08/2020   PLT 199 02/08/2020   Lab Results  Component Value Date   NA 145 03/27/2020   K 5.2 03/27/2020   CO2 17 (L) 03/27/2020   GLUCOSE 104 (H) 03/27/2020   BUN 46 (H) 03/27/2020   CREATININE 1.89 (H) 03/27/2020   BILITOT 0.2 03/27/2020   ALKPHOS 72 01/05/2017   AST 15 03/27/2020   ALT 12 03/27/2020   PROT 5.8 (L) 03/27/2020   ALBUMIN 3.9 12/27/2018   CALCIUM 8.2 (L) 03/27/2020   No results  found for: CHOL No results found for: HDL No results found for: LDLCALC No results found for: TRIG No results found for: CHOLHDL No results found for: HGBA1C    Assessment & Plan:   Problem List Items Addressed This Visit      Other   Long term (current) use of anticoagulants [Z79.01] - Primary   Relevant Orders   POCT INR (Completed)      No orders of the defined types were placed in this encounter.   Follow-up: No follow-ups on file.    Ninfa Meeker, CMA

## 2020-10-31 NOTE — Progress Notes (Addendum)
Called the pt's daughter Reita May and left VM with medication and follow up instructions. Left call back number for any questions or concerns.

## 2020-11-07 ENCOUNTER — Ambulatory Visit (INDEPENDENT_AMBULATORY_CARE_PROVIDER_SITE_OTHER): Payer: Medicare Other | Admitting: Medical-Surgical

## 2020-11-07 ENCOUNTER — Other Ambulatory Visit: Payer: Self-pay

## 2020-11-07 VITALS — BP 135/75 | HR 52 | Temp 97.9°F | Resp 20 | Ht 63.0 in | Wt 108.0 lb

## 2020-11-07 DIAGNOSIS — I4891 Unspecified atrial fibrillation: Secondary | ICD-10-CM

## 2020-11-07 DIAGNOSIS — D6859 Other primary thrombophilia: Secondary | ICD-10-CM | POA: Diagnosis not present

## 2020-11-07 DIAGNOSIS — Z7901 Long term (current) use of anticoagulants: Secondary | ICD-10-CM

## 2020-11-07 DIAGNOSIS — I4811 Longstanding persistent atrial fibrillation: Secondary | ICD-10-CM

## 2020-11-07 DIAGNOSIS — Z86718 Personal history of other venous thrombosis and embolism: Secondary | ICD-10-CM

## 2020-11-07 LAB — POCT INR: INR: 2.8 (ref 2.0–3.0)

## 2020-11-07 MED ORDER — WARFARIN SODIUM 7.5 MG PO TABS: 7.5000 mg | ORAL_TABLET | Freq: Every day | ORAL | 1 refills | Status: DC

## 2020-11-07 MED ORDER — WARFARIN SODIUM 6 MG PO TABS: 6.0000 mg | ORAL_TABLET | Freq: Every day | ORAL | 1 refills | Status: DC

## 2020-11-07 NOTE — Progress Notes (Signed)
Established Patient Office Visit  Subjective:  Patient ID: Gloria Mckay, female    DOB: April 11, 1941  Age: 80 y.o. MRN: [id number]  CC: No chief complaint on file.   HPI Gloria Mckay presents for an INR check. INR today is 2.8.  Past Medical History:  Diagnosis Date  . Arthritis   . Cancer ([place])   . Cataract    bilaterally  . Hypertension   . Myelodysplasia (myelodysplastic syndrome) ([place])   . Myelodysplastic syndrome [facility])     Past Surgical History:  Procedure Laterality Date  . JOINT REPLACEMENT    . STOMACH SURGERY      Family History  Problem Relation Age of Onset  . Hypertension Mother   . Pulmonary embolism Mother   . Heart disease Father     Social History   Socioeconomic History  . Marital status: Widowed    Spouse name: Not on file  . Number of children: 4  . Years of education: 25  . Highest education level: 12th grade  Occupational History  . 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 Smoking status: Never Smoker  . Smokeless tobacco: Never Used  Vaping Use  . Vaping Use: Never used  Substance and Sexual Activity  . Alcohol use: No  . Drug use: No  . Sexual activity: Not Currently  Other Topics Concern  . Not on file  Social History Narrative   Recent illness resulting in decreased ability to ambulate causing social isolation.    Social Determinants of Health   Financial Resource Strain: Low Risk   . Difficulty of Paying Living Expenses: Not hard at all  Food Insecurity: No Food Insecurity  . Worried About Charity fundraiser in the Last Year: Never true  . Ran Out of Food in the Last Year: Never true  Transportation Needs: No Transportation Needs  . Lack of Transportation (Medical): No  . Lack of Transportation (Non-Medical): No  Physical Activity: Inactive  . Days of Exercise per Week: 0 days  . Minutes of Exercise per Session: 0 min  Stress: Stress Concern Present  . Feeling of Stress : To some extent  Social  Connections: Socially Isolated  . Frequency of Communication with Friends and Family: More than three times a week  . Frequency of Social Gatherings with Friends and Family: More than three times a week  . Attends Religious Services: Never  . Active Member of Clubs or Organizations: No  . Attends Archivist Meetings: Never  . Marital Status: Widowed  Intimate Partner Violence: Not At Risk  . Fear of Current or Ex-Partner: No  . Emotionally Abused: No  . Physically Abused: No  . Sexually Abused: No    Outpatient Medications Prior to Visit  Medication Sig Dispense Refill  . AMBULATORY NON FORMULARY MEDICATION Medication Name: rollator walker with seat per patient preference and insurance coverage 1 Units prn  . amLODipine (NORVASC) 10 MG tablet Take 1 tablet (10 mg total) by mouth daily. 90 tablet 3  . Calcium Carbonate-Vitamin D 600-400 MG-UNIT chew tablet Chew 2 tablets by mouth daily. 60 tablet 12  . carvedilol (COREG) 25 MG tablet Take 1 tablet (25 mg total) by mouth 2 (two) times daily with a meal. 180 tablet 0  . cyanocobalamin (,VITAMIN B-12,) 1000 MCG/ML injection Inject 1 mL (1,000 mcg total) into the muscle every 30 (thirty) days. Please include syringes and needles as needed for monthly injections 30 mL 11  .  denosumab (PROLIA) 60 MG/ML SOSY injection Inject 60 mg into the skin every 6 (six) months. Administer in upper arm, thigh, or abdomen 1 Syringe 1  . esomeprazole (NEXIUM) 40 MG capsule Take 1 capsule (40 mg total) by mouth 2 (two) times daily before a meal. 60 capsule 3  . hydrALAZINE (APRESOLINE) 25 MG tablet Take 1 tablet (25 mg total) by mouth 2 (two) times daily. 180 tablet 3  . HYDROcodone-acetaminophen (NORCO) 10-325 MG tablet Take 1 tablet by mouth 3 (three) times daily as needed for moderate pain or severe pain. 60 tablet 0  . hydrOXYzine (ATARAX/VISTARIL) 25 MG tablet Take 0.5-1 tablets (12.5-25 mg total) by mouth every 8 (eight) hours as needed for itching.  30 tablet 0  . losartan (COZAAR) 100 MG tablet Take 1 tablet (100 mg total) by mouth daily. 90 tablet 1  . nitrofurantoin, macrocrystal-monohydrate, (MACROBID) 100 MG capsule Take 1 capsule (100 mg total) by mouth 2 (two) times daily. 14 capsule 0  . Omega-3 Fatty Acids ([employer] FISH OIL PO) Take by mouth.    . Prasterone (INTRAROSA) 6.5 MG INST Place 1 suppository vaginally at bedtime. 30 each 12  . sucralfate (CARAFATE) 1 g tablet Take 1 tablet by mouth 4 (four) times daily -  with meals and at bedtime.    . topiramate (TOPAMAX) 100 MG tablet Take 1.5 tabs (150 mg) in AM and 2 tabs (200 mg) in PM 90 tablet 1  . Vitamin D, Ergocalciferol, (DRISDOL) 1.25 MG (50000 UT) CAPS capsule Take 1 capsule (50,000 Units total) by mouth every 7 (seven) days. 12 capsule 1  . warfarin (COUMADIN) 2 MG tablet Take 1 tablet (2 mg total) by mouth daily. 30 tablet 3  . warfarin (COUMADIN) 4 MG tablet Take 1 tablet (4mg ) by mouth on Monday, Wednesday, Friday 30 tablet 0   No facility-administered medications prior to visit.    Allergies  Allergen Reactions  . Avocado Shortness Of Breath  . Cefuroxime Anaphylaxis    Difficulty swallowing pills because they were too dry.  Caused tablet dysphagia.  . Diazepam Shortness Of Breath    HYPERVENTILATION     . Diphenhydramine Palpitations    Affected heart rate/er told her not to take again  . Latex Rash and Shortness Of Breath    Airway swelling  . Other Palpitations    Affected heart rate/er told her not to take again  . Amlodipine Cough and Other (See Comments)    Not sure  . Butorphanol Hives  . Diclofenac Hives  . Diclofenac Sodium Hives  . Diphenhydramine Hcl Palpitations    Affected heart rate/er told her not to take again  . Methylpyrrolidone Hives  . Ondansetron Hives and Other (See Comments)    Sedates/knocks her out    . Oxycodone Hives  . Penicillins Hives and Swelling    Most mycin drugs  . Quinolones Other (See Comments)    Joint pain  .  Sulfa Antibiotics Hives and Swelling  . Tomato Other (See Comments)    Broke out inside of mouth  . Butorphanol Tartrate Hives  . Lisinopril Cough    Caused pt to cough  . Nexium [Esomeprazole Magnesium] Diarrhea and Nausea And Vomiting  . Ramipril Cough    Pt c/o cough  . Ace Inhibitors Other (See Comments) and Cough    Lisinopril and ramipril   . Ciprofloxacin Rash    Patient prefers not to take Fluoroquinolones   . Codeine Nausea Only  . Pantoprazole Sodium Diarrhea  ROS Review of Systems    Objective:    Physical Exam  BP 135/75 (BP Location: Left Arm, Patient Position: Sitting, Cuff Size: Normal)   Pulse (!) 52   Temp 97.9 F (36.6 C) (Oral)   Resp 20   Ht 5\' 3"  (1.6 m)   Wt 108 lb (49 kg)   SpO2 100%   BMI 19.13 kg/m  Wt Readings from Last 3 Encounters:  11/07/20 108 lb (49 kg)  10/31/20 108 lb (49 kg)  10/17/20 106 lb (48.1 kg)     Health Maintenance Due  Topic Date Due  . COVID-19 Vaccine (1) Never done    There are no preventive care reminders to display for this patient.  Lab Results  Component Value Date   TSH 1.65 02/08/2020   Lab Results  Component Value Date   WBC 7.7 02/08/2020   HGB 10.1 (L) 02/08/2020   HCT 32.7 (L) 02/08/2020   MCV 90.3 02/08/2020   PLT 199 02/08/2020   Lab Results  Component Value Date   NA 145 03/27/2020   K 5.2 03/27/2020   CO2 17 (L) 03/27/2020   GLUCOSE 104 (H) 03/27/2020   BUN 46 (H) 03/27/2020   CREATININE 1.89 (H) 03/27/2020   BILITOT 0.2 03/27/2020   ALKPHOS 72 01/05/2017   AST 15 03/27/2020   ALT 12 03/27/2020   PROT 5.8 (L) 03/27/2020   ALBUMIN 3.9 12/27/2018   CALCIUM 8.2 (L) 03/27/2020   No results found for: CHOL No results found for: HDL No results found for: LDLCALC No results found for: TRIG No results found for: CHOLHDL No results found for: HGBA1C    Assessment & Plan:   Problem List Items Addressed This Visit      Cardiovascular and Mediastinum   Atrial fibrillation  ([place]) [I48.91]   Relevant Orders   POCT INR (Completed)     Hematopoietic and Hemostatic   Primary hypercoagulable state ([place]) [D68.59]   Relevant Orders   POCT INR (Completed)     Other   History of DVT of lower extremity   Long term (current) use of anticoagulants [Z79.01] - Primary   Relevant Orders   POCT INR (Completed)      No orders of the defined types were placed in this encounter.   Follow-up: Return in about 2 weeks (around 11/21/2020).    Ninfa Meeker, CMA

## 2020-11-20 ENCOUNTER — Telehealth: Payer: Self-pay | Admitting: Osteopathic Medicine

## 2020-11-20 NOTE — Telephone Encounter (Signed)
Bayard Males number is [phone].

## 2020-11-20 NOTE — Telephone Encounter (Signed)
Noted  

## 2020-11-20 NOTE — Telephone Encounter (Signed)
FYI: Pt's daughter, Gloria Mckay called and cancelled [name] appointment.  She states that her mom is in the hospital and has been there since 3/19. She went in for  leg infection and now has gout in the other foot. Her INR is being monitored.  Her levels dropped and she was put on Levinol? But now she's back on Coumadin.

## 2020-11-21 ENCOUNTER — Ambulatory Visit: Payer: Medicare Other

## 2020-11-27 ENCOUNTER — Other Ambulatory Visit: Payer: Self-pay | Admitting: Osteopathic Medicine

## 2020-11-28 ENCOUNTER — Telehealth: Payer: Self-pay | Admitting: General Practice

## 2020-11-28 ENCOUNTER — Telehealth: Payer: Self-pay

## 2020-11-28 NOTE — Telephone Encounter (Signed)
Transition Care Management Follow-up Telephone Call  Date of discharge and from where: [facility] 11/27/20  How have you been since you were released from the hospital? Patient stated that she is feeling ok. She is still having a lot of pain in her feet and her legs.  Any questions or concerns? No  Items Reviewed:  Did the pt receive and understand the discharge instructions provided? Yes   Medications obtained and verified? Yes   Other? No   Any new allergies since your discharge? No   Dietary orders reviewed? Yes  Do you have support at home? Yes   Home Care and Equipment/Supplies: Were home health services ordered? yes If so, what is the name of the agency? [facility]  Has the agency set up a time to come to the patient's home? yes Were any new equipment or medical supplies ordered?  No What is the name of the medical supply agency? n/a Were you able to get the supplies/equipment? not applicable Do you have any questions related to the use of the equipment or supplies? No  Functional Questionnaire: (I = Independent and D = Dependent) ADLs: I  Bathing/Dressing- I   Meal Prep- I  Eating- I  Maintaining continence- I  Transferring/Ambulation- I  Managing Meds- I  Follow up appointments reviewed:   PCP Hospital f/u appt confirmed? Yes  Scheduled to see Dr. Sheppard Coil on 12/04/20 at [facility] f/u appt confirmed? Yes  Scheduled to see Infectious Disease Dr. Idelle Crouch.  Are transportation arrangements needed? No   If their condition worsens, is the pt aware to call PCP or go to the Emergency Dept.? Yes  Was the patient provided with contact information for the PCP's office or ED? Yes  Was to pt encouraged to call back with questions or concerns? Yes

## 2020-11-28 NOTE — Telephone Encounter (Signed)
RN called stating that patient's heart rate was 58 today. Pt was asymptomatic. Due to facility protocol, provider must be notified of home visit from nurse.

## 2020-12-01 ENCOUNTER — Ambulatory Visit (INDEPENDENT_AMBULATORY_CARE_PROVIDER_SITE_OTHER): Payer: Medicare Other | Admitting: Family Medicine

## 2020-12-01 ENCOUNTER — Encounter: Payer: Self-pay | Admitting: Family Medicine

## 2020-12-01 ENCOUNTER — Telehealth: Payer: Self-pay

## 2020-12-01 VITALS — BP 128/72 | HR 59 | Temp 98.7°F

## 2020-12-01 DIAGNOSIS — R791 Abnormal coagulation profile: Secondary | ICD-10-CM | POA: Diagnosis not present

## 2020-12-01 DIAGNOSIS — R040 Epistaxis: Secondary | ICD-10-CM | POA: Diagnosis not present

## 2020-12-01 DIAGNOSIS — Z7901 Long term (current) use of anticoagulants: Secondary | ICD-10-CM

## 2020-12-01 NOTE — Progress Notes (Signed)
Acute Office Visit  Subjective:    Patient ID: Gloria Mckay, female    DOB: Jul 20, 1941, 80 y.o.   MRN: [id number]  Chief Complaint  Patient presents with  . Epistaxis    HPI Patient is in today for nose bleeds.   Patient reporting nose bleeds that started last Friday. Reports initially it was just a few drops, but as the weekend went by, nose bleeds heavier and required pressure for about 5 minutes to stop the bleeding. She denies any pain. No injury or trauma. Nose bleeds were happening about twice a day, but she has not yet had one today. Reports she was recently in the hospital for a knee infection and received a lot of antibiotics and is now on long term ABX and following ID. At hospital d/c last Wednesday daughter reports her INR was 2.9  She denies any other symptoms or signs of bleeding. No blood in urine, stool, mouth, change in bruising, chest pain, shortness of breath, dizziness, lightheadedness, abdominal pain, fevers. She is used to doing daily sinus rinses, but did not do any while at the hospital so she thinks she is dried out.   She was set up with home health at discharge. Scheduled to see PCP on Thursday for hospital follow-up.  Past Medical History:  Diagnosis Date  . Arthritis   . Cancer ([place])   . Cataract    bilaterally  . Hypertension   . Myelodysplasia (myelodysplastic syndrome) ([place])   . Myelodysplastic syndrome [facility])     Past Surgical History:  Procedure Laterality Date  . JOINT REPLACEMENT    . STOMACH SURGERY      Family History  Problem Relation Age of Onset  . Hypertension Mother   . Pulmonary embolism Mother   . Heart disease Father     Social History   Socioeconomic History  . Marital status: Widowed    Spouse name: Not on file  . Number of children: 4  . Years of education: 75  . Highest education level: 12th grade  Occupational History  . 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 Smoking status: Never  Smoker  . Smokeless tobacco: Never Used  Vaping Use  . Vaping Use: Never used  Substance and Sexual Activity  . Alcohol use: No  . Drug use: No  . Sexual activity: Not Currently  Other Topics Concern  . Not on file  Social History Narrative   Recent illness resulting in decreased ability to ambulate causing social isolation.    Social Determinants of Health   Financial Resource Strain: Low Risk   . Difficulty of Paying Living Expenses: Not hard at all  Food Insecurity: No Food Insecurity  . Worried About Charity fundraiser in the Last Year: Never true  . Ran Out of Food in the Last Year: Never true  Transportation Needs: No Transportation Needs  . Lack of Transportation (Medical): No  . Lack of Transportation (Non-Medical): No  Physical Activity: Inactive  . Days of Exercise per Week: 0 days  . Minutes of Exercise per Session: 0 min  Stress: Stress Concern Present  . Feeling of Stress : To some extent  Social Connections: Socially Isolated  . Frequency of Communication with Friends and Family: More than three times a week  . Frequency of Social Gatherings with Friends and Family: More than three times a week  . Attends Religious Services: Never  . Active Member of Clubs or Organizations: No  .  Attends Archivist Meetings: Never  . Marital Status: Widowed  Intimate Partner Violence: Not At Risk  . Fear of Current or Ex-Partner: No  . Emotionally Abused: No  . Physically Abused: No  . Sexually Abused: No    Outpatient Medications Prior to Visit  Medication Sig Dispense Refill  . AMBULATORY NON FORMULARY MEDICATION Medication Name: rollator walker with seat per patient preference and insurance coverage 1 Units prn  . amLODipine (NORVASC) 10 MG tablet Take 1 tablet (10 mg total) by mouth daily. 90 tablet 3  . Calcium Carbonate-Vitamin D 600-400 MG-UNIT chew tablet Chew 2 tablets by mouth daily. 60 tablet 12  . carvedilol (COREG) 25 MG tablet Take 1 tablet (25 mg  total) by mouth 2 (two) times daily with a meal. 180 tablet 0  . clindamycin (CLEOCIN) 150 MG capsule Take by mouth.    . clindamycin (CLEOCIN) 150 MG capsule Take by mouth.    . cyanocobalamin (,VITAMIN B-12,) 1000 MCG/ML injection Inject 1 mL (1,000 mcg total) into the muscle every 30 (thirty) days. Please include syringes and needles as needed for monthly injections 30 mL 11  . denosumab (PROLIA) 60 MG/ML SOSY injection Inject 60 mg into the skin every 6 (six) months. Administer in upper arm, thigh, or abdomen 1 Syringe 1  . esomeprazole (NEXIUM) 40 MG capsule Take 1 capsule (40 mg total) by mouth 2 (two) times daily before a meal. 60 capsule 3  . fluconazole (DIFLUCAN) 100 MG tablet Take one tablet (100 mg dose) by mouth daily for 5 days. 5 tablet 0  . hydrALAZINE (APRESOLINE) 25 MG tablet Take 1 tablet (25 mg total) by mouth 2 (two) times daily. 180 tablet 3  . HYDROcodone-acetaminophen (NORCO) 10-325 MG tablet Take 1 tablet by mouth 3 (three) times daily as needed for moderate pain or severe pain. 60 tablet 0  . hydrOXYzine (ATARAX/VISTARIL) 25 MG tablet Take 0.5-1 tablets (12.5-25 mg total) by mouth every 8 (eight) hours as needed for itching. 30 tablet 0  . losartan (COZAAR) 100 MG tablet Take 1 tablet (100 mg total) by mouth daily. 90 tablet 1  . nitrofurantoin, macrocrystal-monohydrate, (MACROBID) 100 MG capsule Take 1 capsule (100 mg total) by mouth 2 (two) times daily. (Patient not taking: Reported on 11/28/2020) 14 capsule 0  . Omega-3 Fatty Acids ([employer] FISH OIL PO) Take by mouth.    . Prasterone (INTRAROSA) 6.5 MG INST Place 1 suppository vaginally at bedtime. 30 each 12  . predniSONE (DELTASONE) 20 MG tablet Take by mouth.    . predniSONE (DELTASONE) 20 MG tablet Take 40 mg by mouth daily.    . Probiotic Product (MISC INTESTINAL FLORA REGULAT) CAPS Take 1 capsule by mouth daily as needed.    . sucralfate (CARAFATE) 1 g tablet Take 1 tablet by mouth 4 (four) times daily -  with meals and  at bedtime.    . topiramate (TOPAMAX) 100 MG tablet Take 1.5 tabs (150 mg) in AM and 2 tabs (200 mg) in PM 90 tablet 1  . Vitamin D, Ergocalciferol, (DRISDOL) 1.25 MG (50000 UT) CAPS capsule Take 1 capsule (50,000 Units total) by mouth every 7 (seven) days. 12 capsule 1  . warfarin (COUMADIN) 6 MG tablet Take 1 tablet (6 mg total) by mouth daily. 30 tablet 1  . warfarin (COUMADIN) 7.5 MG tablet Take 1 tablet (7.5 mg total) by mouth daily. 30 tablet 1   No facility-administered medications prior to visit.    Allergies  Allergen Reactions  .  Avocado Shortness Of Breath  . Cefuroxime Anaphylaxis    Difficulty swallowing pills because they were too dry.  Caused tablet dysphagia.  . Diazepam Shortness Of Breath    HYPERVENTILATION     . Diphenhydramine Palpitations    Affected heart rate/er told her not to take again  . Latex Rash and Shortness Of Breath    Airway swelling  . Other Palpitations    Affected heart rate/er told her not to take again  . Amlodipine Cough and Other (See Comments)    Not sure  . Butorphanol Hives  . Diclofenac Hives  . Diclofenac Sodium Hives  . Diphenhydramine Hcl Palpitations    Affected heart rate/er told her not to take again  . Methylpyrrolidone Hives  . Ondansetron Hives and Other (See Comments)    Sedates/knocks her out    . Oxycodone Hives and Nausea Only  . Penicillins Hives and Swelling    Most mycin drugs  . Quinolones Other (See Comments)    Joint pain  . Sulfa Antibiotics Hives and Swelling  . Tomato Other (See Comments)    Broke out inside of mouth  . Butorphanol Tartrate Hives  . Lisinopril Cough    Caused pt to cough  . Nexium [Esomeprazole Magnesium] Diarrhea and Nausea And Vomiting  . Ramipril Cough    Pt c/o cough  . Ace Inhibitors Other (See Comments) and Cough    Lisinopril and ramipril   . Ciprofloxacin Rash    Patient prefers not to take Fluoroquinolones   . Codeine Nausea Only  . Pantoprazole Sodium Diarrhea     Review of Systems All review of systems negative except what is listed in the HPI     Objective:    Physical Exam Vitals reviewed.  Constitutional:      Appearance: Normal appearance.  HENT:     Head: Normocephalic and atraumatic.     Nose: No congestion or rhinorrhea.     Comments: Erythematous, small amount of dried blood visible in right nare    Mouth/Throat:     Mouth: Mucous membranes are moist.     Pharynx: Oropharynx is clear.  Cardiovascular:     Rate and Rhythm: Normal rate and regular rhythm.     Pulses: Normal pulses.     Heart sounds: Normal heart sounds.  Pulmonary:     Effort: Pulmonary effort is normal.     Breath sounds: Normal breath sounds.  Skin:    General: Skin is warm and dry.  Neurological:     General: No focal deficit present.     Mental Status: She is alert and oriented to person, place, and time. Mental status is at baseline.  Psychiatric:        Mood and Affect: Mood normal.        Behavior: Behavior normal.        Thought Content: Thought content normal.        Judgment: Judgment normal.     BP 128/72   Pulse (!) 59   Temp 98.7 F (37.1 C)   SpO2 95%  Wt Readings from Last 3 Encounters:  11/07/20 108 lb (49 kg)  10/31/20 108 lb (49 kg)  10/17/20 106 lb (48.1 kg)    Health Maintenance Due  Topic Date Due  . COVID-19 Vaccine (1) Never done  . TETANUS/TDAP  11/12/2020    There are no preventive care reminders to display for this patient.   Lab Results  Component Value Date   TSH  1.65 02/08/2020   Lab Results  Component Value Date   WBC 7.7 02/08/2020   HGB 10.1 (L) 02/08/2020   HCT 32.7 (L) 02/08/2020   MCV 90.3 02/08/2020   PLT 199 02/08/2020   Lab Results  Component Value Date   NA 145 03/27/2020   K 5.2 03/27/2020   CO2 17 (L) 03/27/2020   GLUCOSE 104 (H) 03/27/2020   BUN 46 (H) 03/27/2020   CREATININE 1.89 (H) 03/27/2020   BILITOT 0.2 03/27/2020   ALKPHOS 72 01/05/2017   AST 15 03/27/2020   ALT 12  03/27/2020   PROT 5.8 (L) 03/27/2020   ALBUMIN 3.9 12/27/2018   CALCIUM 8.2 (L) 03/27/2020   No results found for: CHOL No results found for: HDL No results found for: LDLCALC No results found for: TRIG No results found for: CHOLHDL No results found for: HGBA1C     Assessment & Plan:   1. Epistaxis 2. Long term (current) use of anticoagulants [Z79.01] Unable to obtain POCT INR due to supply issues so sending her for lab draw today. Will update patient when results are in and plan of care regarding her coumadin. Discussed case with Dr. Sheppard Coil. Educated on signs and symptoms requiring urgent evaluation.   - INR/PT  Follow-up with PCP on Thursday as previously scheduled or sooner if needed.   Terrilyn Saver, NP

## 2020-12-01 NOTE — Telephone Encounter (Signed)
RN Conan Bowens called requesting a verbal order to complete weekly nurse visits, INR and PT checks for the patient. Verbal order given to nurse to proceed with home nurse visits.   Physical Therapist Freda Munro called requesting PT for strength, balance and gait. Left a detailed vm msg for physical therapist to proceed with at home PT for two times a wk/for 8 weeks. Direct call back info provided.

## 2020-12-02 ENCOUNTER — Telehealth: Payer: Self-pay

## 2020-12-02 ENCOUNTER — Telehealth: Payer: Self-pay | Admitting: Osteopathic Medicine

## 2020-12-02 DIAGNOSIS — R791 Abnormal coagulation profile: Secondary | ICD-10-CM

## 2020-12-02 LAB — PROTIME-INR
INR: 10.7
Prothrombin Time: 93.6 s — ABNORMAL HIGH (ref 9.0–11.5)

## 2020-12-02 MED ORDER — PHYTONADIONE 5 MG PO TABS: 5.0000 mg | ORAL_TABLET | Freq: Once | ORAL | 0 refills | Status: AC

## 2020-12-02 NOTE — Progress Notes (Signed)
Your INR did jump up. I talked to Dr. Sheppard Coil - we want you to skip today's warfarin dose and take a dose of vitamin k ASAP (sending to [facility]). We want you to come in first thing tomorrow morning for a STAT INR downstairs at the lab so we can have results by mid-day tomorrow. If you have any significant bleeding or a fall, you need to go to the ED immediately.

## 2020-12-02 NOTE — Addendum Note (Signed)
Addended by: Caleen Jobs B on: 12/02/2020 08:07 AM   Modules accepted: Orders

## 2020-12-02 NOTE — Telephone Encounter (Signed)
[facility] called and stated they are out of vitamin K. Phamacist stated he called other pharmacies and they are out as well. He is asking for an alternative. Please advise, thanks.

## 2020-12-02 NOTE — Telephone Encounter (Signed)
Gloria Mckay saw her yesterday and is handling it

## 2020-12-02 NOTE — Telephone Encounter (Signed)
Unfortunately we have no INR strips and I'm currently unable to order them.

## 2020-12-02 NOTE — Telephone Encounter (Signed)
Granddaughter calling in wanting to know if she has to come in tomorrow morning for an INR check since it was really high or can she wait till her hospital follow up on Thursday with PCP.

## 2020-12-03 NOTE — Telephone Encounter (Signed)
Spoke with our [employer] team, [employer], [place], and [employer].  [facility] borrows their stock from inpatient inventory, so in [place], they should be a reliable source. It requires patient travel to [place], but could prevent an ED visit, so there is at least some benefit added.  Patient is pending repeat INR check on 12/03/20, await results and collaboration with provider for further management.  Reccomendations for now:  - Patient either eat 1/2 can spinach (which equals 2.5mg  of vitamin K) or RX for PO vitamin K 2.5mg  x1 dose to [place] warfarin, repeat INR every 1-2 days, resume warfarin at 6mg  daily when INR < 3.  - Discontinue current Calcium+ Vit D formulation and begin Viactiv chew tablets (the one WITH vitamin K 24mcg) 2 tablets daily (long term), which allow for daily recommended C & D, plus Vit K for INR stability.  Will follow along - please do not hesitate to reach out for further assistance or updates!  Thank you, Lysle Morales

## 2020-12-04 ENCOUNTER — Telehealth: Payer: Self-pay

## 2020-12-04 ENCOUNTER — Other Ambulatory Visit: Payer: Self-pay

## 2020-12-04 ENCOUNTER — Ambulatory Visit (INDEPENDENT_AMBULATORY_CARE_PROVIDER_SITE_OTHER): Payer: Medicare Other | Admitting: Osteopathic Medicine

## 2020-12-04 ENCOUNTER — Encounter: Payer: Self-pay | Admitting: Osteopathic Medicine

## 2020-12-04 VITALS — BP 161/80 | HR 60 | Temp 98.1°F | Wt 109.1 lb

## 2020-12-04 DIAGNOSIS — D6859 Other primary thrombophilia: Secondary | ICD-10-CM | POA: Diagnosis not present

## 2020-12-04 DIAGNOSIS — R040 Epistaxis: Secondary | ICD-10-CM | POA: Diagnosis not present

## 2020-12-04 DIAGNOSIS — Z7901 Long term (current) use of anticoagulants: Secondary | ICD-10-CM | POA: Diagnosis not present

## 2020-12-04 DIAGNOSIS — R791 Abnormal coagulation profile: Secondary | ICD-10-CM | POA: Diagnosis not present

## 2020-12-04 DIAGNOSIS — I4891 Unspecified atrial fibrillation: Secondary | ICD-10-CM

## 2020-12-04 DIAGNOSIS — Z86718 Personal history of other venous thrombosis and embolism: Secondary | ICD-10-CM

## 2020-12-04 MED ORDER — CLINDAMYCIN HCL 150 MG PO CAPS: 150.0000 mg | ORAL_CAPSULE | Freq: Three times a day (TID) | ORAL | 1 refills | Status: AC

## 2020-12-04 MED ORDER — CALCIUM-VITAMIN D-VITAMIN K 500-500-40 MG-UNT-MCG PO CHEW: 2.0000 | CHEWABLE_TABLET | Freq: Every day | ORAL | 3 refills | Status: DC

## 2020-12-04 MED ORDER — HYDROCODONE-ACETAMINOPHEN 5-325 MG PO TABS: 1.0000 | ORAL_TABLET | ORAL | 0 refills | Status: DC | PRN

## 2020-12-04 NOTE — Telephone Encounter (Signed)
Gloria Mckay, [facility]'s home health nurse called and stated she checked her INR today and it was 6.

## 2020-12-04 NOTE — Telephone Encounter (Signed)
Occupational therapist called stating that a home evaluation was completed. Pt has declined to continue with home occupational therapy. The referral will be closed.

## 2020-12-04 NOTE — Telephone Encounter (Signed)
Thanks so much! Patient did not come in for INR labs yesterday She has a visit w/ me today, hopefully will keep that appt! I'll be in touch!

## 2020-12-04 NOTE — Patient Instructions (Addendum)
   INR check Monday (please call Home Health to make sure someone is coming out that day)   Plan to restart Coumadin 6 mg daily on Monday   Increasing Calcium supplementation, recheck levels in another month   Changing the Hydrocodone from 10 mg to 5 mg pills, can take up to 5 per day (usually 1 at a time, can take 2 at a time for severe pain), #150 pills for 30 days.

## 2020-12-04 NOTE — Progress Notes (Signed)
Gloria Mckay is a 80 y.o. female who presents to  [place] at [facility]  today, 12/04/20, seeking care for the following:  . Hospital follow up: 11 days inpatient for infection joint prostheis . Follow up supra therapeutic INR - appreciate input from Dr Jens Som, [place] with other pertinent findings:  There were no encounter diagnoses.    Patient Instructions   INR check Monday (please call Home Health to make sure someone is coming out that day)   Plan to restart Coumadin 6 mg daily on Monday   Increasing Calcium supplementation, recheck levels in another month   Changing the Hydrocodone from 10 mg to 5 mg pills, can take up to 5 per day (usually 1 at a time, can take 2 at a time for severe pain), #150 pills for 30 days.     No orders of the defined types were placed in this encounter.   Meds ordered this encounter  Medications  . clindamycin (CLEOCIN) 150 MG capsule    Sig: Take 1 capsule (150 mg total) by mouth 3 (three) times daily.    Dispense:  90 capsule    Refill:  1  . Calcium-Vitamin D-Vitamin K 025-427-06 MG-UNT-MCG CHEW    Sig: Chew 2 tablets by mouth daily.    Dispense:  180 tablet    Refill:  3  . HYDROcodone-acetaminophen (NORCO/VICODIN) 5-325 MG tablet    Sig: Take 1-2 tablets by mouth every 4 (four) hours as needed for moderate pain (while awake, maximum 5 tablets per 24 hours).    Dispense:  150 tablet    Refill:  0     See below for relevant physical exam findings  See below for recent lab and imaging results reviewed  Medications, allergies, PMH, PSH, SocH, [place] reviewed below    Follow-up instructions: Return in about 4 weeks (around 01/01/2021) for VIRTUAL ([place] OR PHONE) *OR* IN-OFFICE VISIT MONITOR ON CHANGE PAIN MEDICATION .               Exam:  BP (!) 161/80 (BP Location: Left Arm, Patient Position: Sitting, Cuff Size: Normal)   Pulse 60   Temp 98.1 F  (36.7 C) (Oral)   Wt 109 lb 1.3 oz (49.5 kg)   BMI 19.32 kg/m   Constitutional: VS see above. General Appearance: alert, well-developed, well-nourished, NAD  Neck: No masses, trachea midline.   Respiratory: Normal respiratory effort. no wheeze, no rhonchi, no rales  Cardiovascular: S1/S2 normal, no murmur, no rub/gallop auscultated. RRR.   Musculoskeletal: Gait steady w/ walker   Neurological: Normal balance/coordination. No tremor.  Skin: warm, dry, intact.   Psychiatric: Normal judgment/insight. Normal mood and affect. Oriented x3.   Current Meds  Medication Sig  . AMBULATORY NON FORMULARY MEDICATION Medication Name: rollator walker with seat per patient preference and insurance coverage  . amLODipine (NORVASC) 10 MG tablet Take 1 tablet (10 mg total) by mouth daily.  . Calcium-Vitamin D-Vitamin K 500-500-40 MG-UNT-MCG CHEW Chew 2 tablets by mouth daily.  . carvedilol (COREG) 25 MG tablet Take 1 tablet (25 mg total) by mouth 2 (two) times daily with a meal.  . cyanocobalamin (,VITAMIN B-12,) 1000 MCG/ML injection Inject 1 mL (1,000 mcg total) into the muscle every 30 (thirty) days. Please include syringes and needles as needed for monthly injections  . denosumab (PROLIA) 60 MG/ML SOSY injection Inject 60 mg into the skin every 6 (six) months. Administer in upper  arm, thigh, or abdomen  . esomeprazole (NEXIUM) 40 MG capsule Take 1 capsule (40 mg total) by mouth 2 (two) times daily before a meal.  . fluconazole (DIFLUCAN) 100 MG tablet Take one tablet (100 mg dose) by mouth daily for 5 days.  Marland Kitchen HYDROcodone-acetaminophen (NORCO/VICODIN) 5-325 MG tablet Take 1-2 tablets by mouth every 4 (four) hours as needed for moderate pain (while awake, maximum 5 tablets per 24 hours).  . hydrOXYzine (ATARAX/VISTARIL) 25 MG tablet Take 0.5-1 tablets (12.5-25 mg total) by mouth every 8 (eight) hours as needed for itching.  . losartan (COZAAR) 100 MG tablet Take 1 tablet (100 mg total) by mouth  daily.  . Omega-3 Fatty Acids ([employer] FISH OIL PO) Take by mouth.  . Probiotic Product (MISC INTESTINAL FLORA REGULAT) CAPS Take 1 capsule by mouth daily as needed.  . sucralfate (CARAFATE) 1 g tablet Take 1 tablet by mouth 4 (four) times daily -  with meals and at bedtime.  . topiramate (TOPAMAX) 100 MG tablet Take 1.5 tabs (150 mg) in AM and 2 tabs (200 mg) in PM  . warfarin (COUMADIN) 6 MG tablet Take 1 tablet (6 mg total) by mouth daily.  Marland Kitchen warfarin (COUMADIN) 7.5 MG tablet Take 1 tablet (7.5 mg total) by mouth daily.  . [DISCONTINUED] Calcium Carbonate-Vitamin D 600-400 MG-UNIT chew tablet Chew 2 tablets by mouth daily.  . [DISCONTINUED] clindamycin (CLEOCIN) 150 MG capsule Take by mouth.  . [DISCONTINUED] clindamycin (CLEOCIN) 150 MG capsule Take by mouth.  . [DISCONTINUED] hydrALAZINE (APRESOLINE) 25 MG tablet Take 1 tablet (25 mg total) by mouth 2 (two) times daily.  . [DISCONTINUED] HYDROcodone-acetaminophen (NORCO) 10-325 MG tablet Take 1 tablet by mouth 3 (three) times daily as needed for moderate pain or severe pain.  . [DISCONTINUED] nitrofurantoin, macrocrystal-monohydrate, (MACROBID) 100 MG capsule Take 1 capsule (100 mg total) by mouth 2 (two) times daily.  . [DISCONTINUED] Prasterone (INTRAROSA) 6.5 MG INST Place 1 suppository vaginally at bedtime.  . [DISCONTINUED] predniSONE (DELTASONE) 20 MG tablet Take by mouth.  . [DISCONTINUED] predniSONE (DELTASONE) 20 MG tablet Take 40 mg by mouth daily.  . [DISCONTINUED] Vitamin D, Ergocalciferol, (DRISDOL) 1.25 MG (50000 UT) CAPS capsule Take 1 capsule (50,000 Units total) by mouth every 7 (seven) days.    Allergies  Allergen Reactions  . Avocado Shortness Of Breath  . Cefuroxime Anaphylaxis    Difficulty swallowing pills because they were too dry.  Caused tablet dysphagia.  . Diazepam Shortness Of Breath    HYPERVENTILATION     . Diphenhydramine Palpitations    Affected heart rate/er told her not to take again  . Latex Rash and  Shortness Of Breath    Airway swelling  . Other Palpitations    Affected heart rate/er told her not to take again  . Amlodipine Cough and Other (See Comments)    Not sure  . Butorphanol Hives  . Diclofenac Hives  . Diclofenac Sodium Hives  . Diphenhydramine Hcl Palpitations    Affected heart rate/er told her not to take again  . Methylpyrrolidone Hives  . Ondansetron Hives and Other (See Comments)    Sedates/knocks her out    . Oxycodone Hives and Nausea Only  . Penicillins Hives and Swelling    Most mycin drugs  . Quinolones Other (See Comments)    Joint pain  . Sulfa Antibiotics Hives and Swelling  . Tomato Other (See Comments)    Broke out inside of mouth  . Butorphanol Tartrate Hives  . Lisinopril  Cough    Caused pt to cough  . Nexium [Esomeprazole Magnesium] Diarrhea and Nausea And Vomiting  . Ramipril Cough    Pt c/o cough  . Ace Inhibitors Other (See Comments) and Cough    Lisinopril and ramipril   . Ciprofloxacin Rash    Patient prefers not to take Fluoroquinolones   . Codeine Nausea Only  . Pantoprazole Sodium Diarrhea    Patient Active Problem List   Diagnosis Date Noted  . Encounter for Medicare annual wellness exam 07/14/2020  . Dysuria 07/14/2020  . B12 deficiency 02/08/2020  . Acute abdominal pain 02/08/2020  . Myelodysplastic syndrome with 5 q minus ([place]) 08/28/2018  . Accidental fall 08/11/2018  . Headache 06/16/2018  . Atrial tachycardia ([place]) 05/08/2018  . Pain in right hip 03/21/2018  . Vertebral compression fracture ([place]) 03/21/2018  . Anemia of chronic disease 11/08/2017  . Bronchiectasis with acute lower respiratory infection ([place]) 10/28/2017  . Vocal tremor 10/13/2017  . Chronic infection of prosthetic knee, subsequent encounter 09/06/2017  . Biclonal gammopathy 08/12/2017  . 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 Pseudomonas infection 04/25/2017  . Long term (current) use of anticoagulants [Z79.01] 02/24/2017  . Primary hypercoagulable  state ([place]) [D68.59] 02/24/2017  . Atrial fibrillation ([place]) [I48.91] 02/24/2017  . DDD (degenerative disc disease), cervical 02/11/2017  . Elevated MCV 01/11/2017  . History of partial gastrectomy 01/11/2017  . Benign hypertension with CKD (chronic kidney disease) stage III ([place]) 10/28/2016  . History of arthroplasty of left knee 10/25/2016  . Plantar fasciitis, left 10/22/2016  . Fibroma of tongue 06/14/2016  . Postmenopausal osteoporosis 02/06/2016  . History of pulmonary embolism 01/30/2016  . CKD (chronic kidney disease) stage 3, GFR 30-59 ml/min (HCC) 08/19/2015  . Dry eye syndrome of both lacrimal glands 08/19/2015  . Status post cataract extraction and insertion of intraocular lens of left eye 08/19/2015  . Chronic hypernatremia 08/19/2015  . Cortical age-related cataract of right eye 08/19/2015  . Nuclear sclerotic cataract of right eye 08/19/2015  . CKD stage G3b/A1, GFR 30-44 and albumin creatinine ratio <30 mg/g (HCC) 08/19/2015  . Iron deficiency anemia due to chronic blood loss 02/21/2015  . AMD (age-related macular degeneration), bilateral 02/05/2015  . Congenital hypertrophy of retinal pigment epithelium 02/05/2015  . Endothelial corneal dystrophy 02/05/2015  . Cornea guttata 02/05/2015  . Lumbar spinal stenosis 05/09/2014  . S/P colonoscopy 01/23/2014  . S/P endoscopy 01/23/2014  . Gastric bezoar 08/06/2013  . History of MRSA infection 12/30/2012  . Long term current use of anticoagulant therapy 08/23/2011  . Mild intermittent asthma without complication 45/62/5638  . Chest pain 01/15/2010  . Esophageal reflux 11/05/2009  . Gastroesophageal reflux disease without esophagitis 11/05/2009  . DDD (degenerative disc disease), lumbar 09/19/2009  . Infection and inflammatory reaction due to internal joint prosthesis ([place]) 08/08/2009  . Vitamin D deficiency 05/12/2009  . Chronic nausea 02/12/2009  . History of DVT of lower extremity 02/12/2009  . Recurrent major  depressive disorder, in partial remission ([place]) 02/12/2009    Family History  Problem Relation Age of Onset  . Hypertension Mother   . Pulmonary embolism Mother   . Heart disease Father     Social History   Tobacco Use  Smoking Status Never Smoker  Smokeless Tobacco Never Used    Past Surgical History:  Procedure Laterality Date  . JOINT REPLACEMENT    . STOMACH SURGERY      Immunization History  Administered Date(s) Administered  . Fluad Quad(high Dose 65+) 06/15/2019  .  Influenza Split 05/12/2009, 05/22/2012, 06/02/2016  . Influenza, High Dose Seasonal PF 05/22/2012, 05/25/2013, 05/21/2014, 06/05/2015, 04/21/2017, 05/19/2018, 06/15/2019, 05/28/2020  . Influenza-Unspecified 05/18/2011, 06/02/2016, 04/21/2017, 05/19/2018  . Pneumococcal Conjugate-13 08/13/2016  . Pneumococcal Polysaccharide-23 02/03/2003, 07/31/2013  . Pneumococcal-Unspecified 02/03/2003, 07/31/2013  . Tdap 11/13/2010    Recent Results (from the past 2160 hour(s))  POCT INR     Status: Normal   Collection Time: 10/10/20 10:49 AM  Result Value Ref Range   INR 2.7 2.0 - 3.0  POCT INR     Status: None   Collection Time: 10/17/20  2:09 PM  Result Value Ref Range   INR 2.7 2.0 - 3.0  POCT INR     Status: Abnormal   Collection Time: 10/31/20  2:13 PM  Result Value Ref Range   INR 1.7 (A) 2.0 - 3.0  POCT INR     Status: Normal   Collection Time: 11/07/20  3:26 PM  Result Value Ref Range   INR 2.8 2.0 - 3.0  INR/PT     Status: Abnormal   Collection Time: 12/01/20  3:52 PM  Result Value Ref Range   INR 10.7 (HH)     Comment: Verified by repeat analysis. Marland Kitchen Reference Range                     0.9-1.1 Moderate-intensity Warfarin Therapy 2.0-3.0 Higher-intensity Warfarin Therapy   3.0-4.0  .    Prothrombin Time 93.6 (H) 9.0 - 11.5 sec    Comment: For additional information, please refer to http://education.questdiagnostics.com/faq/FAQ104 (This link is being provided for informational/ educational  purposes only.)   Magnesium     Status: None   Collection Time: 12/03/20 12:00 AM  Result Value Ref Range   Magnesium 2.1 1.5 - 2.5 mg/dL  Methylmalonic acid, serum     Status: None   Collection Time: 12/03/20 12:00 AM  Result Value Ref Range   Methylmalonic Acid, Quant 239 87 - 318 nmol/L    Comment: . This test was developed and its analytical performance characteristics have been determined by [place], [place]. It has not been cleared or approved by the U.S. Food and Drug Administration. This assay has been validated pursuant to the CLIA regulations and is used for clinical purposes. .   Phosphorus     Status: None   Collection Time: 12/03/20 12:00 AM  Result Value Ref Range   Phosphorus 4.0 2.1 - 4.3 mg/dL  Homocysteine     Status: Abnormal   Collection Time: 12/03/20 12:00 AM  Result Value Ref Range   Homocysteine 38.4 (H) <10.4 umol/L    Comment: Homocysteine is increased by functional deficiency of  folate or vitamin B12. Testing for methylmalonic acid  differentiates between these deficiencies. Other causes  of increased homocysteine include renal failure, folate  antagonists such as methotrexate and phenytoin, and  exposure to nitrous oxide. Frazier Richards, et al., Ann Intern Med. 1999;131(5):331-9.     No results found.     All questions at time of visit were answered - patient instructed to contact office with any additional concerns or updates. ER/RTC precautions were reviewed with the patient as applicable.   Please note: manual typing as well as voice recognition software may have been used to produce this document - typos may escape review. Please contact Dr. Sheppard Coil for any needed clarifications.

## 2020-12-06 LAB — METHYLMALONIC ACID, SERUM: Methylmalonic Acid, Quant: 239 nmol/L (ref 87–318)

## 2020-12-06 LAB — PHOSPHORUS: Phosphorus: 4 mg/dL (ref 2.1–4.3)

## 2020-12-06 LAB — HOMOCYSTEINE: Homocysteine: 38.4 umol/L — ABNORMAL HIGH (ref <10.4)

## 2020-12-06 LAB — MAGNESIUM: Magnesium: 2.1 mg/dL (ref 1.5–2.5)

## 2020-12-08 ENCOUNTER — Telehealth: Payer: Self-pay

## 2020-12-08 ENCOUNTER — Encounter: Payer: Self-pay | Admitting: Osteopathic Medicine

## 2020-12-08 NOTE — Telephone Encounter (Signed)
Are all INR / Coumadin doses coming to you as long as you're working / not on PAL?  I believe we discussed her dose on Friday  Thanks!

## 2020-12-08 NOTE — Telephone Encounter (Signed)
[place], a Therapist, sports from [employer] called with results. Today INR is 1.2, Protime is 14.9. The RN would like to know what medications Gloria Mckay should be taking. Please advise, thanks.

## 2020-12-09 NOTE — Telephone Encounter (Signed)
Sure was the wrong [place]! Thanks for following up!  Kenney Houseman, can we call home health and have them check INR Thurs AM and let us know what it is before end of day? Patient shoul dbe on 6 mg coumadin daily I did send them a MyChart messge w/ Rx instructions.

## 2020-12-09 NOTE — Telephone Encounter (Signed)
Pt called back and left vm on triage confirming that she is doing the 6mg  daily.

## 2020-12-09 NOTE — Telephone Encounter (Signed)
Spoke w/[place] and advised her to have someone come on Thursday for INR check with pt. She had an INR done on 12/08/2020 it was 1.2.  LVM asking that pt rtn call about coumadin dosage and that a nurse will come out on Thursday for recheck.

## 2020-12-11 ENCOUNTER — Ambulatory Visit (INDEPENDENT_AMBULATORY_CARE_PROVIDER_SITE_OTHER): Payer: Medicare Other | Admitting: Pharmacist

## 2020-12-11 ENCOUNTER — Encounter: Payer: Self-pay | Admitting: Pharmacist

## 2020-12-11 ENCOUNTER — Telehealth: Payer: Self-pay

## 2020-12-11 DIAGNOSIS — D6859 Other primary thrombophilia: Secondary | ICD-10-CM

## 2020-12-11 DIAGNOSIS — I4891 Unspecified atrial fibrillation: Secondary | ICD-10-CM

## 2020-12-11 DIAGNOSIS — Z7901 Long term (current) use of anticoagulants: Secondary | ICD-10-CM

## 2020-12-11 LAB — POCT INR: INR: 2.2 (ref 2.0–3.0)

## 2020-12-11 NOTE — Telephone Encounter (Signed)
Gloria Mckay with [name] called and reports INR of 2.2. She reports taking 6 mg daily since last check.   All has been done on an anti-coagulation encounter.   Sending to [facility] for documentation.

## 2020-12-11 NOTE — Telephone Encounter (Signed)
Thank you, Levada Dy!  I advised same dose, warfarin 6mg  daily, repeat INR in 5 days on Tuesday 12/16/20. Levada Dy documented the complete plan in [place] encounter, notified patient.   Will continue to follow,  Darius Bump

## 2020-12-11 NOTE — Progress Notes (Signed)
Patient advised to continue with 6 mg once daily and recheck in 5 days, per Larinda Buttery, [facility]

## 2020-12-15 NOTE — Telephone Encounter (Signed)
Home Health advised.  

## 2020-12-16 ENCOUNTER — Ambulatory Visit (INDEPENDENT_AMBULATORY_CARE_PROVIDER_SITE_OTHER): Payer: Medicare Other | Admitting: Pharmacist

## 2020-12-16 ENCOUNTER — Telehealth: Payer: Self-pay | Admitting: Pharmacist

## 2020-12-16 ENCOUNTER — Other Ambulatory Visit: Payer: Self-pay

## 2020-12-16 DIAGNOSIS — Z86718 Personal history of other venous thrombosis and embolism: Secondary | ICD-10-CM | POA: Diagnosis not present

## 2020-12-16 DIAGNOSIS — Z7901 Long term (current) use of anticoagulants: Secondary | ICD-10-CM | POA: Diagnosis not present

## 2020-12-16 DIAGNOSIS — I4891 Unspecified atrial fibrillation: Secondary | ICD-10-CM | POA: Diagnosis not present

## 2020-12-16 NOTE — Telephone Encounter (Cosign Needed)
Anticoagulation Management per Pharmacy  Received a call from [place] with [facility], callback [phone].   INR on 12/16/20 = 5.6   (last reading, 2.2 on 12/11/20)  Patient endorses no missed doses, taking 6mg  daily.  Has not been taking the Viactiv (Vit D, C & K) chewtabs.  Also admits to variable dietary patterns over past several days.  Tablet STRENGTH on hand include: 2mg , 4mg , 7.5mg , and 6mg .   Recommendations: - Skip today's dose of warfarin - Resume warfarin on Wednesday, 12/17/20 at 5mg  (do this by taking one 4mg  tablet and 1/2 tablet of 2mg  to equal dose) - Take 2 viactiv chew tablets daily, indefinitely, for vitamin daily requirement as well as vitamin K stabilization - Keep a consistent diet of SAME daily amounts of spinach & leafy greens - Recheck INR with home health on Tuesday, 12/23/20 - Home health to call clinic 12/23/20 with INR result to pharmacist for further adjustments  Information was relayed to [facility], as well as patient & daughter, [name], who are all in agreement.     Darius Bump

## 2020-12-18 ENCOUNTER — Encounter: Payer: Self-pay | Admitting: Pharmacist

## 2020-12-18 NOTE — Progress Notes (Signed)
Subjective:    Gloria Mckay is a 80 y.o. female here for follow-up of chronic anticoagulation for atrial fibrillation and hx DVT. Bleeding signs/symptoms:  None Thromboembolic signs/symptoms:  None  Missed Coumadin doses: None Medication changes: Patient was not taking the Viactiv (vit-K containing) chew tabs due to confusion as to if this is longstanding plan versus acute use for high INR several weeks ago Dietary changes: Patient/caregiver admits to slight fluctuations with less vitamin K containing vegetables over past week Bacterial/viral infection: no Other concerns: no  Todays INR reported by [place] with [facility], callback [phone].  Review of Systems Review of systems not obtained due to patient factors.   Objective:    Current warfarin (COUMADIN) dose: 6mg  once daily  INR 12/16/20 = 5.6 Lab Results  Component Value Date   INR 2.2 12/11/2020   INR 10.7 (HH) 12/01/2020   INR 2.8 11/07/2020   INR 1.7 (A) 10/31/2020   INR 1.2 (A) 09/04/2020   INR 1.2 (A) 08/21/2020      Assessment:    Supratherapeutic INR for goal of 2-3    Plan:    Dose: SKIP dose on 12/16/20, THEN resume warfarin on 12/17/20 at 5mg  daily.   Do this with available tablets at home, using: ONE tablet of 4mg , and HALF tablet of 2mg  to equal 5mg .   Diet/Other Medications: Take 2 chewtabs daily of the Viactiv chews, and keep diet as consistent as possible on a daily basis. Patient enjoys spinach, which is fine, but encouraged same amounts of spinach from day to day. Discussed this with patient/caregiver.  Next INR: 1 week  12/23/20.  Home health to call clinic 12/23/20 with INR result to pharmacist for further adjustments.  Information was relayed to [place] w/[facility], as well as patient & daughter, Gloria Mckay, who are all in agreement.  Darius Bump

## 2020-12-19 ENCOUNTER — Telehealth: Payer: Self-pay

## 2020-12-19 DIAGNOSIS — D649 Anemia, unspecified: Secondary | ICD-10-CM

## 2020-12-19 NOTE — Telephone Encounter (Signed)
CBC order signed.

## 2020-12-19 NOTE — Telephone Encounter (Signed)
Transition Care Management Follow-up Telephone Call  Date of discharge and from where: 12/18/2020 from [facility] How have you been since you were released from the hospital? Pt stated that she is feeling okay but still feel weak. Pt is going for blood work today to follow up on labs that were done at the ED.   Pt stated that she does not drive. I did inform patient that [facility] offers transportation services at no charge for appointments at any of our facilities. Note: Referral must be placed.   Transportation with [employer]. The number is [phone]. The afterhours number is [phone] and they only transport for [facility].  [facility] is open from 7am to 6pm Monday through Friday. They contract with [employer] and [employer] and use a 3rd party ride Patent examiner. They only use approved drivers for the contract. They enter in the ride information into the platform and then a driver approves the pickup. They do not currently have a way to eliminate the language barrier that a patient may have.  [facility] does need a referral, and this is completed via email to transportation@[email or web address] or via phone call to [phone].  They can also provide "Safe Transport" for patients that need [place] assistance. The patient must agree before Safe Transport can be arranged. We refer through the same process as above. Safe Transport is also contracted out.    Any questions or concerns? No  Items Reviewed:  Did the pt receive and understand the discharge instructions provided? Yes   Medications obtained and verified? Yes   Other? No   Any new allergies since your discharge? No   Dietary orders reviewed? n/a  Do you have support at home? Yes   Functional Questionnaire: (I = Independent and D = Dependent) ADLs: I  Bathing/Dressing- I  Meal Prep- I  Eating- I  Maintaining continence- I  Transferring/Ambulation- I  Managing Meds- I   Follow up appointments  reviewed:   PCP Hospital f/u appt confirmed? No  Scheduled to see Emeterio Reeve, DO on 01/01/2021 @ 2:40pm.  [facility] f/u appt confirmed? No  Scheduled to see Eilene Ghazi, MD on 12/31/2020 @ 11:20am.  Are transportation arrangements needed? No   If their condition worsens, is the pt aware to call PCP or go to the Emergency Dept.? Yes  Was the patient provided with contact information for the PCP's office or ED? Yes  Was to pt encouraged to call back with questions or concerns? Yes

## 2020-12-19 NOTE — Telephone Encounter (Signed)
Pt's granddaughter called and stated pt was seen in the ED yesterday for a fall. ED told them she needed to have her PT INR and hemoglobin checked within 24hours. Pt has standing order for PT INR, pended order for CBC.

## 2020-12-22 NOTE — Telephone Encounter (Signed)
Pt aware lab orders are active. She states that she came up here on Friday to have the labs drawn and the one girl she likes to draw her blood was not there and the other girl was new and didn't seem like she was qualified to draw blood, so she left. I told her that she could come up today and see who is working or she could go to any [facility] drawing station and have the labs drawn there. Pt verbalized understanding. No further questions or concerns at this time.

## 2020-12-23 ENCOUNTER — Ambulatory Visit (INDEPENDENT_AMBULATORY_CARE_PROVIDER_SITE_OTHER): Payer: Medicare Other | Admitting: Pharmacist

## 2020-12-23 DIAGNOSIS — D6859 Other primary thrombophilia: Secondary | ICD-10-CM | POA: Diagnosis not present

## 2020-12-23 DIAGNOSIS — I482 Chronic atrial fibrillation, unspecified: Secondary | ICD-10-CM

## 2020-12-23 DIAGNOSIS — Z7901 Long term (current) use of anticoagulants: Secondary | ICD-10-CM | POA: Diagnosis not present

## 2020-12-23 LAB — POCT INR: INR: 5.7 — AB (ref 2–3)

## 2020-12-23 NOTE — Patient Instructions (Signed)
SKIP today's dose (12/23/20) Resume Warfarin 5mg  daily, starting tomorrow, 12/24/20.  Next INR check this Thursday 12/25/20 to be sure we are headed in the right direction. Feel better soon, Ms. Priego!

## 2020-12-23 NOTE — Progress Notes (Signed)
Subjective:    Gloria Mckay is a 80 y.o. female here for follow-up of chronic anticoagulation for atrial fibrillation. Bleeding signs/symptoms:  Mild - bruising after a fall last week Thromboembolic signs/symptoms:  None  Missed Coumadin doses: None (skipped 12/16/20 dose, as directed, then 5mg  daily) Medication changes: no Dietary changes: no Bacterial/viral infection: no Other concerns: yes - patient experienced a fall approximately last Thurs, 12/18/20. Went to hospital, workup largely negative, but some expected soreness & bruising.  Review of Systems Review of systems not obtained due to patient factors. - virtual visit.  Objective:    Current warfarin (COUMADIN) dose:  5mg  daily, omitted 12/16/20 dose = 30mg  total weekly dose INR 12/18/20 for acute admission = 2.1 INR 12/23/20 via POCT with home health = 5.7  Lab Results  Component Value Date   INR 2.2 12/11/2020   INR 10.7 (HH) 12/01/2020   INR 2.8 11/07/2020   INR 1.7 (A) 10/31/2020   INR 1.2 (A) 09/04/2020   INR 1.2 (A) 08/21/2020      Assessment:    Supratherapeutic INR for goal of 2-3    Plan:    Dose: SKIP today's dose (12/23/20), then resume at 5mg  daily.  Next INR: Thursday, 12/25/20 to be sure trends back down to goal range after acute illness/physiologic stress s/p fall   Plan was discussed with home health RN + patient.  This Thursday, will await call from [facility] RN) for another INR check.

## 2020-12-24 NOTE — Addendum Note (Signed)
Addended by: Darius Bump on: 12/24/2020 02:53 PM   Modules accepted: Orders

## 2020-12-25 ENCOUNTER — Ambulatory Visit (INDEPENDENT_AMBULATORY_CARE_PROVIDER_SITE_OTHER): Payer: Medicare Other | Admitting: Pharmacist

## 2020-12-25 DIAGNOSIS — Z7901 Long term (current) use of anticoagulants: Secondary | ICD-10-CM | POA: Diagnosis not present

## 2020-12-25 DIAGNOSIS — D6859 Other primary thrombophilia: Secondary | ICD-10-CM | POA: Diagnosis not present

## 2020-12-25 DIAGNOSIS — I482 Chronic atrial fibrillation, unspecified: Secondary | ICD-10-CM

## 2020-12-25 LAB — POCT INR: INR: 2.1 (ref 2–3)

## 2020-12-25 NOTE — Progress Notes (Signed)
Subjective:    Gloria Mckay is a 80 y.o. female here for follow-up of chronic anticoagulation for atrial fibrillation and hx DVT. Bleeding signs/symptoms:  Mild - bruising (as expected) after a fall 7/86/76 Thromboembolic signs/symptoms:  None  Missed Coumadin doses: SKIPPED 12/23/20 dose, as directed, no other missed doses Medication changes: no Dietary changes: no Bacterial/viral infection: no Other concerns: no  Review of Systems Review of systems not obtained due to patient factors. -virtual visit  Objective:    Current warfarin (COUMADIN) dose:  Warfarin 5mg  daily Lab Results  Component Value Date   INR 2.1 12/25/2020   INR 5.7 (A) 12/23/2020   INR 2.2 12/11/2020   INR 10.7 (HH) 12/01/2020   INR 1.2 (A) 09/04/2020   INR 1.2 (A) 08/21/2020      Assessment:    Therapeutic INR for goal of 2-3    Plan:    Dose: no change - Warfarin 5mg  once daily Next INR: 1 week  - repeat INR on 01/01/21, call clinic with result (ask for pharmacist)

## 2020-12-25 NOTE — Patient Instructions (Signed)
Great chatting with you today!  Today we discussed:  - Continue warfarin 5mg  once daily - Repeat INR on 01/01/21, call clinic with result (ask for pharmacist), prior to phone visit with Dr. Sheppard Coil on same day.  - We will make warfarin plans, then if we need new RX, can ask Dr. Sheppard Coil during that phone visit.  - See below for Vitamin K containing foods, the goal is consistent daily amount, not A BUNCH one day and NONE the next. Does not mean she has to eat spinach every day for the rest of her life, these can be interchangeable!   Vitamin K Foods and Warfarin Warfarin is a blood thinner (anticoagulant). Anticoagulant medicines help prevent the formation of blood clots. Warfarin works by blocking the activity of vitamin K, which promotes normal blood clotting. When you take warfarin, problems can occur from suddenly increasing or decreasing the amount of vitamin K that you eat from one day to the next. These problems can occur due to varying levels of warfarin in your blood. Problems may include:  Blood clots.  Bleeding. What are tips for eating the right amount of vitamin K? To avoid problems when taking warfarin:  Eat a balanced diet that includes: ? Fresh fruits and vegetables. ? Whole grains. ? Low-fat dairy products. ? Lean proteins, such as fish, eggs, and lean cuts of meat.  Keep your intake of vitamin K consistent from day to day. To do this: ? Avoid eating large amounts of vitamin K one day and low amounts of vitamin K the next day. ? If you take a multivitamin that contains vitamin K, be sure to take it every day. ? Know which foods contain vitamin K. Read food labels. Use the lists below to understand serving sizes and the amount of vitamin K in one serving.  Avoid major changes in your diet. If you are going to change your diet, talk with your health care provider before making changes.  Work with a Financial planner (dietitian) to develop a meal plan that works best  for you.   What foods are high in vitamin K? Foods that are high in vitamin K contain more than 100 mcg (micrograms) per serving. These include:  Broccoli (cooked from fresh) -  cup (78 g) has 110 mcg.  Brussels sprouts (cooked from fresh) -  cup (78 g) has 109 mcg.  Greens, beet (cooked from fresh) -  cup (72 g) has 350 mcg.  Greens, collard (cooked from fresh) -  cup (66 g) has 263 mcg.  Greens, turnip (cooked from fresh) -  cup (72 g) has 265 mcg.  Green onions or scallions -  cup (50 g) has 105 mcg.  Kale (cooked from fresh) -  cup (68 g) has 536 mcg.  Parsley (raw) - 10 sprigs (10 g) has 164 mcg.  Spinach (cooked from fresh) -  cup (90 g) has 444 mcg.  Swiss chard (cooked from fresh) -  cup (88 g) has 287 mcg.   What foods have a moderate amount of vitamin K? Foods that have a moderate amount of vitamin K contain 25-100 mcg per serving. These include:  Asparagus (cooked from fresh) - 4 spears (60 g) have 30 mcg.  Black-eyed peas (dried) -  cup (85 g) has 32 mcg.  Cabbage (cooked from fresh) -  cup (78 g) has 84 mcg.  Cabbage (raw) -  cup (35 g) has 26 mcg.  Kiwi fruit - 1 medium (69 g) has 27 mcg.  Lettuce (raw) - 1 cup (36 g) has 45 mcg.  Okra (cooked from fresh) -  cup (80 g) has 32 mcg.  Prunes (dried) - 5 prunes (47 g) have 25 mcg.  Watercress (raw) - 1 cup (34 g) has 85 mcg. What foods are low in vitamin K? Foods low in vitamin K contain less than 25 mcg per serving. These include:  Artichoke - 1 medium (128 g) has 18 mcg.  Avocado - 1 oz (21 g) has 6 mcg.  Blueberries -  cup (73 g) has 14 mcg.  Carrots (cooked from fresh) -  cup (78 g) has 11 mcg.  Cauliflower (raw) -  cup (54 g) has 8 mcg.  Cucumber with peel (raw) -  cup (52 g) has 9 mcg.  Grapes -  cup (76 g) has 12 mcg.  Mango - 1 medium (207 g) has 9 mcg.  Mixed nuts - 1 cup (142 g) has 17 mcg.  Pear - 1 medium (178 g) has 8 mcg.  Peas (cooked from fresh) -  cup (80  g) has 20 mcg.  Pickled cucumber - 1 spear (65 g) has 11 mcg.  Sauerkraut (canned) -  cup (118 g) has 16 mcg.  Soybeans (cooked from fresh) -  cup (86 g) has 16 mcg.  Tomato (raw) - 1 medium (123 g) has 10 mcg.  Tomato sauce (raw) -  cup (123 g) has 17 mcg.   What foods do not have vitamin K? If a food contains less than 5 mcg per serving, it is considered to have no vitamin K. These foods include:  Bread and cereal products.  Cheese.  Eggs.  Fish and shellfish.  Meat and poultry.  Milk and dairy products.  Seeds, such as sunflower or pumpkin seeds. The items listed above may not be a complete list of foods that have high, moderate, and low amounts of vitamin K or do not have vitamin K. Actual amounts of vitamin K may differ depending on processing. Contact a dietitian for more information. Summary  Warfarin is an anticoagulant that prevents blood clots by blocking the activity of vitamin K. It is important to monitor the content of vitamin K in your foods and to be consistent with the amount of vitamin K you take in each day.  Avoid major changes in your diet. If you are going to change your diet, talk with your health care provider before making changes. This information is not intended to replace advice given to you by your health care provider. Make sure you discuss any questions you have with your health care provider. Document Revised: 06/05/2019 Document Reviewed: 06/05/2019 Elsevier Patient Education  [place].

## 2021-01-01 ENCOUNTER — Ambulatory Visit (INDEPENDENT_AMBULATORY_CARE_PROVIDER_SITE_OTHER): Payer: Medicare Other | Admitting: Pharmacist

## 2021-01-01 ENCOUNTER — Telehealth (INDEPENDENT_AMBULATORY_CARE_PROVIDER_SITE_OTHER): Payer: Medicare Other | Admitting: Osteopathic Medicine

## 2021-01-01 DIAGNOSIS — I4891 Unspecified atrial fibrillation: Secondary | ICD-10-CM | POA: Diagnosis not present

## 2021-01-01 DIAGNOSIS — D6859 Other primary thrombophilia: Secondary | ICD-10-CM

## 2021-01-01 DIAGNOSIS — Z7901 Long term (current) use of anticoagulants: Secondary | ICD-10-CM

## 2021-01-01 DIAGNOSIS — D46C Myelodysplastic syndrome with isolated del(5q) chromosomal abnormality: Secondary | ICD-10-CM

## 2021-01-01 DIAGNOSIS — M5030 Other cervical disc degeneration, unspecified cervical region: Secondary | ICD-10-CM | POA: Diagnosis not present

## 2021-01-01 DIAGNOSIS — I482 Chronic atrial fibrillation, unspecified: Secondary | ICD-10-CM | POA: Diagnosis not present

## 2021-01-01 LAB — POCT INR: INR: 2.9 (ref 2–3)

## 2021-01-01 MED ORDER — HYDROCODONE-ACETAMINOPHEN 5-325 MG PO TABS: 1.0000 | ORAL_TABLET | ORAL | 0 refills | Status: DC | PRN

## 2021-01-01 MED ORDER — WARFARIN SODIUM 5 MG PO TABS: ORAL_TABLET | ORAL | 0 refills | Status: DC

## 2021-01-01 NOTE — Progress Notes (Signed)
Telemedicine Visit via  Audio only - telephone (patient preference /  technical difficulty with MyChart video application)  I connected with [name] on 01/01/21 at 2:59 PM  by phone or  telemedicine application as noted above  I verified that I am speaking with or regarding  the correct patient using two identifiers.  Participants: Myself, Dr Emeterio Reeve DO Patient: Gloria Mckay Patient proxy if applicable: none, patient has capacity  Other, if applicable: daughter   Patient is at home I am in office at [facility]    I discussed the limitations of evaluation and management  by telemedicine and the availability of in person appointments.  The participant(s) above expressed understanding and  agreed to proceed with this appointment via telemedicine.        History of Present Illness: Gloria Mckay is a 80 y.o. female who would like to discuss pain medications follow-up    Pain medications adjusted last visit:  Hydrocodone-APAP 5-325 mg 1-2 tablets q4 hr pen, max 5 tablets / 24 hours. Pt reports doing well w/ this  She saw oncology (myelodysplastic syndrome), being treated for UTI. Anemia and pt feeling weak w/ recent fall. Significant anemia, doing better after transfusion. Might be needing to undergo repeat bone marrow biopsy.  Just finished my phone visit with them - she'll need RX for Warfarin 34m "take 1 tablet daily, except Thursdays take 1/2 tablet" - I am placing in my note but just FYI :)      Observations/Objective: There were no vitals taken for this visit. BP Readings from Last 3 Encounters:  12/11/20 120/60  12/04/20 (!) 161/80  12/01/20 128/72   Exam: Normal Speech.  NAD  Lab and Radiology Results Results for orders placed or performed in visit on 01/01/21 (from the past 72 hour(s))  POCT INR     Status: None   Collection Time: 01/01/21  1:00 PM  Result Value Ref Range   INR 2.9 2 - 3   No results found.     Assessment and  Plan: 80y.o. female with The primary encounter diagnosis was DDD (degenerative disc disease), cervical. Diagnoses of Long term (current) use of anticoagulants, Atrial fibrillation, unspecified type ([place], and Myelodysplastic syndrome with 5 q minus ([place] were also pertinent to this visit.  Continue meds as is, thanks to pharmacy for helping w/ coumadin dosing  conitnue current pain meds folow as directed w/ there specialists   PDMP not reviewed this encounter. No orders of the defined types were placed in this encounter.  Meds ordered this encounter  Medications  . warfarin (COUMADIN) 5 MG tablet    Sig: take 1 tablet daily, except Thursdays take 1/2 tablet    Dispense:  90 tablet    Refill:  0  . HYDROcodone-acetaminophen (NORCO/VICODIN) 5-325 MG tablet    Sig: Take 1-2 tablets by mouth every 4 (four) hours as needed for moderate pain (while awake, maximum 5 tablets per 24 hours).    Dispense:  150 tablet    Refill:  0   There are no Patient Instructions on file for this visit.  Instructions sent via MyChart.   Follow Up Instructions: No follow-ups on file.    I discussed the assessment and treatment plan with the patient. The patient was provided an opportunity to ask questions and all were answered. The patient agreed with the plan and demonstrated an understanding of the instructions.   The patient was advised to call back or seek an in-person evaluation if  any new concerns, if symptoms worsen or if the condition fails to improve as anticipated.  21 minutes of non-face-to-face time was provided during this encounter.      . . . . . . . . . . . . . Marland Kitchen                   Historical information moved to improve visibility of documentation.  Past Medical History:  Diagnosis Date  . Arthritis   . Cancer ([place])   . Cataract    bilaterally  . Hypertension   . Myelodysplasia (myelodysplastic syndrome) ([place])   . Myelodysplastic syndrome [facility])     Past Surgical History:  Procedure Laterality Date  . JOINT REPLACEMENT    . STOMACH SURGERY     Social History   Tobacco Use  . Smoking status: Never Smoker  . Smokeless tobacco: Never Used  Substance Use Topics  . Alcohol use: No   family history includes Heart disease in her father; Hypertension in her mother; Pulmonary embolism in her mother.  Medications: Current Outpatient Medications  Medication Sig Dispense Refill  . warfarin (COUMADIN) 5 MG tablet take 1 tablet daily, except Thursdays take 1/2 tablet 90 tablet 0  . AMBULATORY NON FORMULARY MEDICATION Medication Name: rollator walker with seat per patient preference and insurance coverage 1 Units prn  . amLODipine (NORVASC) 10 MG tablet Take 1 tablet (10 mg total) by mouth daily. 90 tablet 3  . Calcium-Vitamin D-Vitamin K 510-258-52 MG-UNT-MCG CHEW Chew 2 tablets by mouth daily. 180 tablet 3  . carvedilol (COREG) 25 MG tablet Take 1 tablet (25 mg total) by mouth 2 (two) times daily with a meal. 180 tablet 0  . clindamycin (CLEOCIN) 150 MG capsule Take 1 capsule (150 mg total) by mouth 3 (three) times daily. 90 capsule 1  . cyanocobalamin (,VITAMIN B-12,) 1000 MCG/ML injection Inject 1 mL (1,000 mcg total) into the muscle every 30 (thirty) days. Please include syringes and needles as needed for monthly injections 30 mL 11  . denosumab (PROLIA) 60 MG/ML SOSY injection Inject 60 mg into the skin every 6 (six) months. Administer in upper arm, thigh, or abdomen 1 Syringe 1  . esomeprazole (NEXIUM) 40 MG capsule Take 1 capsule (40 mg total) by mouth 2 (two) times daily before a meal. 60 capsule 3  . fluconazole (DIFLUCAN) 100 MG tablet Take one tablet (100 mg dose) by mouth daily for 5 days. 5 tablet 0  . HYDROcodone-acetaminophen (NORCO/VICODIN) 5-325 MG tablet Take 1-2 tablets by mouth every 4 (four) hours as needed for moderate pain (while awake, maximum 5 tablets per 24 hours). 150 tablet 0  . hydrOXYzine (ATARAX/VISTARIL) 25 MG  tablet Take 0.5-1 tablets (12.5-25 mg total) by mouth every 8 (eight) hours as needed for itching. 30 tablet 0  . losartan (COZAAR) 100 MG tablet Take 1 tablet (100 mg total) by mouth daily. 90 tablet 1  . Omega-3 Fatty Acids ([employer] FISH OIL PO) Take by mouth.    . Probiotic Product (MISC INTESTINAL FLORA REGULAT) CAPS Take 1 capsule by mouth daily as needed.    . sucralfate (CARAFATE) 1 g tablet Take 1 tablet by mouth 4 (four) times daily -  with meals and at bedtime.    . topiramate (TOPAMAX) 100 MG tablet Take 1.5 tabs (150 mg) in AM and 2 tabs (200 mg) in PM 90 tablet 1   No current facility-administered medications for this visit.   Allergies  Allergen Reactions  .  Avocado Shortness Of Breath  . Cefuroxime Anaphylaxis    Difficulty swallowing pills because they were too dry.  Caused tablet dysphagia.  . Diazepam Shortness Of Breath    HYPERVENTILATION     . Diphenhydramine Palpitations    Affected heart rate/er told her not to take again  . Latex Rash and Shortness Of Breath    Airway swelling  . Other Palpitations    Affected heart rate/er told her not to take again  . Amlodipine Cough and Other (See Comments)    Not sure  . Butorphanol Hives  . Diclofenac Hives  . Diclofenac Sodium Hives  . Diphenhydramine Hcl Palpitations    Affected heart rate/er told her not to take again  . Methylpyrrolidone Hives  . Ondansetron Hives and Other (See Comments)    Sedates/knocks her out    . Oxycodone Hives and Nausea Only  . Penicillins Hives and Swelling    Most mycin drugs  . Quinolones Other (See Comments)    Joint pain  . Sulfa Antibiotics Hives and Swelling  . Tomato Other (See Comments)    Broke out inside of mouth  . Butorphanol Tartrate Hives  . Lisinopril Cough    Caused pt to cough  . Nexium [Esomeprazole Magnesium] Diarrhea and Nausea And Vomiting  . Ramipril Cough    Pt c/o cough  . Ace Inhibitors Other (See Comments) and Cough    Lisinopril and ramipril   .  Ciprofloxacin Rash    Patient prefers not to take Fluoroquinolones   . Codeine Nausea Only  . Pantoprazole Sodium Diarrhea     If phone visit, billing and coding can please add appropriate modifier if needed

## 2021-01-01 NOTE — Progress Notes (Signed)
Subjective:    Gloria Mckay is a 80 y.o. female here for follow-up of chronic anticoagulation for atrial fibrillation and hx DVT. Bleeding signs/symptoms:  None Thromboembolic signs/symptoms:  None  Missed Coumadin doses: SKIPPED 12/23/20 dose, as directed, no other missed doses Medication changes: yes - nitrofurantoin RX 12/29/20, 7 day course for UTI Dietary changes: no Bacterial/viral infection: yes - UTI Other concerns: no   Review of Systems Review of systems not obtained due to patient factors. -phone visit.  Objective:    Current warfarin (COUMADIN) dose: 5mg  daily Lab Results  Component Value Date   INR 2.9 01/01/2021   INR 2.1 12/25/2020   INR 5.7 (A) 12/23/2020   INR 10.7 (HH) 12/01/2020   INR 1.2 (A) 09/04/2020   INR 1.2 (A) 08/21/2020      Assessment:    Therapeutic INR for goal of 2-3    Plan:    Dose: Take 1/2 tablet (2.5mg ) on Thursdays, and 1 tablet (5mg ) all other days. Next INR: 1 week  on 01/08/21, Call clinic & ask for pharmacist  Needs RX for Warfarin 5mg  tablet strength, discussed with provider via epic messaging.

## 2021-01-08 ENCOUNTER — Ambulatory Visit (INDEPENDENT_AMBULATORY_CARE_PROVIDER_SITE_OTHER): Payer: Medicare Other | Admitting: Pharmacist

## 2021-01-08 DIAGNOSIS — Z7901 Long term (current) use of anticoagulants: Secondary | ICD-10-CM | POA: Diagnosis not present

## 2021-01-08 DIAGNOSIS — D6859 Other primary thrombophilia: Secondary | ICD-10-CM | POA: Diagnosis not present

## 2021-01-08 DIAGNOSIS — I482 Chronic atrial fibrillation, unspecified: Secondary | ICD-10-CM

## 2021-01-08 LAB — POCT INR: INR: 2 (ref 2–3)

## 2021-01-08 NOTE — Progress Notes (Signed)
Subjective:    Gloria Mckay is a 80 y.o. female here for follow-up of chronic anticoagulation for atrial fibrillation and hx DVT. Bleeding signs/symptoms:  None Thromboembolic signs/symptoms:  None  Missed Coumadin doses: None Medication changes: no Dietary changes: no Bacterial/viral infection: no Other concerns: no  Review of Systems Review of systems not obtained due to patient factors.  Telephone visit  Objective:    Current warfarin (COUMADIN) dose: 5mg  daily, except 2.5mg  on Thursdays. Lab Results  Component Value Date   INR 2.0 01/08/2021   INR 2.9 01/01/2021   INR 2.1 12/25/2020   INR 10.7 (HH) 12/01/2020   INR 1.2 (A) 09/04/2020   INR 1.2 (A) 08/21/2020      Assessment:    Therapeutic INR for goal of 2-3    Plan:    Dose: no change - Using the 5mg  tablet size: Take 1 tablet (5mg ) daily EXCEPT 1/2 tablet (2.5mg ) on Thursdays. Next INR: 1 week

## 2021-01-08 NOTE — Patient Instructions (Signed)
No changes this week! - Take 1 tablet (5mg ) daily, EXCEPT 1/2 tablet (2.5mg ) on Thursdays - Repeat INR with home health 01/15/21 - Call clinic & ask for pharmacy if you have any questions/concerns in the meantime!  Thanks, Darius Bump

## 2021-01-14 ENCOUNTER — Ambulatory Visit (INDEPENDENT_AMBULATORY_CARE_PROVIDER_SITE_OTHER): Payer: Medicare Other | Admitting: Pharmacist

## 2021-01-14 DIAGNOSIS — I482 Chronic atrial fibrillation, unspecified: Secondary | ICD-10-CM

## 2021-01-14 DIAGNOSIS — Z7901 Long term (current) use of anticoagulants: Secondary | ICD-10-CM

## 2021-01-14 DIAGNOSIS — D6859 Other primary thrombophilia: Secondary | ICD-10-CM | POA: Diagnosis not present

## 2021-01-15 ENCOUNTER — Ambulatory Visit: Payer: Medicare Other | Admitting: Pharmacist

## 2021-01-15 LAB — POCT INR: INR: 2.4 (ref 2–3)

## 2021-01-16 NOTE — Progress Notes (Unsigned)
SEE PREVIOUS NOTE 6/43/53, duplicate encounter created in error.  -------------------------------------Void---------------------------

## 2021-01-16 NOTE — Patient Instructions (Signed)
No change!!  Keep up the great work, Ms. Sachdeva! Continue Warfarin 5mg  daily EXCEPT 2.5mg  (1/2 tablet) on Thursdays. Recheck INR in one week with home health, 01/22/21.

## 2021-01-16 NOTE — Progress Notes (Signed)
Subjective:    Gloria Mckay is a 80 y.o. female here for follow-up of chronic anticoagulation for atrial fibrillation and hx DVT. Bleeding signs/symptoms:  None Thromboembolic signs/symptoms:  None  Missed Coumadin doses: This week - none Medication changes: no Dietary changes: no Bacterial/viral infection: no Other concerns: patient seeing heme onc frequently who is closely managing anemia and Hgb trend    Objective:    Current warfarin (COUMADIN) dose: 5mg  daily EXCEPT 2.5mg  on Thursdays Lab Results  Component Value Date   INR 2.4 01/15/2021   INR 2.0 01/08/2021   INR 2.9 01/01/2021   INR 10.7 (HH) 12/01/2020   INR 1.2 (A) 09/04/2020   INR 1.2 (A) 08/21/2020      Assessment:    Therapeutic INR for goal of 2-3    Plan:    Dose: no change Next INR: 1 week

## 2021-01-19 ENCOUNTER — Encounter: Payer: Self-pay | Admitting: Osteopathic Medicine

## 2021-01-22 ENCOUNTER — Ambulatory Visit (INDEPENDENT_AMBULATORY_CARE_PROVIDER_SITE_OTHER): Payer: Medicare Other | Admitting: Pharmacist

## 2021-01-22 ENCOUNTER — Telehealth: Payer: Self-pay

## 2021-01-22 DIAGNOSIS — I482 Chronic atrial fibrillation, unspecified: Secondary | ICD-10-CM | POA: Diagnosis not present

## 2021-01-22 DIAGNOSIS — Z7901 Long term (current) use of anticoagulants: Secondary | ICD-10-CM

## 2021-01-22 DIAGNOSIS — D6859 Other primary thrombophilia: Secondary | ICD-10-CM | POA: Diagnosis not present

## 2021-01-22 LAB — POCT INR: INR: 2.3 (ref 2–3)

## 2021-01-22 NOTE — Patient Instructions (Signed)
Yay for another week in goal range! Today we discussed: - NO DOSE CHANGE: Take Warfarin 5mg  daily EXCEPT 2.5mg  (1/2 tablet) on Thursdays - Next INR w/home health in 2 weeks, phone visit with pharmacist on 02/05/21 in the afternoon - Keep up the good work, [name] & family!  Gloria Mckay

## 2021-01-22 NOTE — Progress Notes (Signed)
Subjective:    Gloria Mckay is a 80 y.o. female here for follow-up of chronic anticoagulation for atrial fibrillation and hx DVT. Bleeding signs/symptoms:  None Thromboembolic signs/symptoms:  None  Missed Coumadin doses: None Medication changes: no Dietary changes: no Bacterial/viral infection: no Other concerns: no  Objective:    Current warfarin (COUMADIN) dose: Warfarin 5mg  daily EXCEPT 2.5mg  (1/2 tablet) on Thursdays Lab Results  Component Value Date   INR 2.3 01/22/2021   INR 2.4 01/15/2021   INR 2.0 01/08/2021   INR 10.7 (HH) 12/01/2020   INR 1.2 (A) 09/04/2020   INR 1.2 (A) 08/21/2020      Assessment:    Therapeutic INR for goal of 2-3    Plan:    Dose: no change Next INR: 2 weeks

## 2021-01-22 NOTE — Telephone Encounter (Signed)
[place] from [facility] called with Marlisha's PT INR results. PT 27.5 INR 2.3  Callback # - [phone]

## 2021-02-05 LAB — POCT INR: INR: 1.7 — AB (ref 2–3)

## 2021-02-06 ENCOUNTER — Other Ambulatory Visit: Payer: Self-pay

## 2021-02-06 ENCOUNTER — Ambulatory Visit (INDEPENDENT_AMBULATORY_CARE_PROVIDER_SITE_OTHER): Payer: Medicare Other | Admitting: Pharmacist

## 2021-02-06 DIAGNOSIS — Z7901 Long term (current) use of anticoagulants: Secondary | ICD-10-CM

## 2021-02-06 DIAGNOSIS — I482 Chronic atrial fibrillation, unspecified: Secondary | ICD-10-CM

## 2021-02-06 DIAGNOSIS — D6859 Other primary thrombophilia: Secondary | ICD-10-CM | POA: Diagnosis not present

## 2021-02-06 NOTE — Progress Notes (Signed)
Subjective:    Jasleen Riepe is a 80 y.o. female here for follow-up of chronic anticoagulation for atrial fibrillation and hx DVT . Bleeding signs/symptoms:  None Thromboembolic signs/symptoms:  None  Missed Coumadin doses: None Medication changes: no Dietary changes: reports "less appetite" and more diarrhea Bacterial/viral infection: no Other concerns: no    Objective:    Current warfarin (COUMADIN) dose: Warfarin 5mg  daily EXCEPT 2.5mg  on Thursdays Lab Results  Component Value Date   INR 1.7 (A) 02/05/2021   INR 2.3 01/22/2021   INR 2.4 01/15/2021   INR 10.7 (HH) 12/01/2020   INR 1.2 (A) 09/04/2020   INR 1.2 (A) 08/21/2020       Assessment:    Subtherapeutic INR for goal of 2-3    Plan:    Dose: Take 1.5 tablets (7.5mg ) TONIGHT, then 5mg  daily thereafter. Next INR: 1 week  Thursday 02/12/21. Plan relayed to [place] with home health [phone]) as well as Avyonna's grandaughter. Plan for next call is for home health RN to submit mychart message of result, then I will call back for dosing guidance.

## 2021-02-06 NOTE — Patient Instructions (Signed)
-  Take 7.5mg  TONIGHT, then 5mg  daily thereafter -Next INR on Thursday, 02/12/21

## 2021-02-08 ENCOUNTER — Other Ambulatory Visit: Payer: Self-pay | Admitting: Osteopathic Medicine

## 2021-02-09 MED ORDER — HYDROCODONE-ACETAMINOPHEN 5-325 MG PO TABS: 1.0000 | ORAL_TABLET | ORAL | 0 refills | Status: DC | PRN

## 2021-02-12 ENCOUNTER — Ambulatory Visit (INDEPENDENT_AMBULATORY_CARE_PROVIDER_SITE_OTHER): Payer: Medicare Other | Admitting: Pharmacist

## 2021-02-12 ENCOUNTER — Other Ambulatory Visit: Payer: Self-pay

## 2021-02-12 DIAGNOSIS — Z7901 Long term (current) use of anticoagulants: Secondary | ICD-10-CM

## 2021-02-12 DIAGNOSIS — I482 Chronic atrial fibrillation, unspecified: Secondary | ICD-10-CM

## 2021-02-12 DIAGNOSIS — D6859 Other primary thrombophilia: Secondary | ICD-10-CM

## 2021-02-12 LAB — POCT INR: INR: 1.7 — AB (ref 2–3)

## 2021-02-12 NOTE — Progress Notes (Signed)
Subjective:    Gloria Mckay is a 80 y.o. female here for follow-up of chronic anticoagulation for atrial fibrillation and hx DVT . Bleeding signs/symptoms:  None Thromboembolic signs/symptoms:  None  Missed Coumadin doses: None Medication changes: not taking sodium bicarbonate Dietary changes: no Bacterial/viral infection: no Other concerns: yes - lots of diarrhea. Additionally, pt is receiving transfusions as needed for anemia, followed by hematology.    Objective:    Current warfarin (COUMADIN) dose: Warfarin 7.5mg  on Thursdays, 5mg  daily all other days Lab Results  Component Value Date   INR 1.7 (A) 02/12/2021   INR 1.7 (A) 02/05/2021   INR 2.3 01/22/2021   INR 10.7 (HH) 12/01/2020   INR 1.2 (A) 09/04/2020   INR 1.2 (A) 08/21/2020       Assessment:    Subtherapeutic INR for goal of 2-3    Plan:    Dose: no change, suspect INR lack of increase despite dose increase is d/t infusion. Will monitor.  Next INR: 1 week

## 2021-02-19 ENCOUNTER — Other Ambulatory Visit: Payer: Self-pay

## 2021-02-19 ENCOUNTER — Ambulatory Visit (INDEPENDENT_AMBULATORY_CARE_PROVIDER_SITE_OTHER): Payer: Medicare Other | Admitting: Pharmacist

## 2021-02-19 DIAGNOSIS — D6859 Other primary thrombophilia: Secondary | ICD-10-CM

## 2021-02-19 DIAGNOSIS — I482 Chronic atrial fibrillation, unspecified: Secondary | ICD-10-CM

## 2021-02-19 DIAGNOSIS — Z7901 Long term (current) use of anticoagulants: Secondary | ICD-10-CM

## 2021-02-19 LAB — POCT INR: INR: 2.5 (ref 2–3)

## 2021-02-19 NOTE — Progress Notes (Signed)
Subjective:    Gloria Mckay is a 80 y.o. female here for follow-up of chronic anticoagulation for atrial fibrillation and hx DVT . Bleeding signs/symptoms:  None Thromboembolic signs/symptoms:  None  Missed Coumadin doses: None Medication changes: no Dietary changes: no Bacterial/viral infection: no Other concerns: persistent diarrhea, GI dr following, cdiff ruled out   Objective:    Current warfarin (COUMADIN) dose: 7.5mg  on Thursdays, 5mg  daily thereafter Lab Results  Component Value Date   INR 2.5 02/19/2021   INR 1.7 (A) 02/12/2021   INR 1.7 (A) 02/05/2021   INR 10.7 (HH) 12/01/2020   INR 1.2 (A) 09/04/2020   INR 1.2 (A) 08/21/2020       Assessment:    Therapeutic INR for goal of 2-3    Plan:    Dose: no change Next INR: 1 week

## 2021-02-20 ENCOUNTER — Other Ambulatory Visit: Payer: Self-pay

## 2021-02-20 ENCOUNTER — Encounter: Payer: Self-pay | Admitting: Osteopathic Medicine

## 2021-02-20 ENCOUNTER — Encounter: Payer: Self-pay | Admitting: Physician Assistant

## 2021-02-20 ENCOUNTER — Ambulatory Visit (INDEPENDENT_AMBULATORY_CARE_PROVIDER_SITE_OTHER): Payer: Medicare Other | Admitting: Physician Assistant

## 2021-02-20 VITALS — BP 161/70 | HR 59 | Temp 98.2°F

## 2021-02-20 DIAGNOSIS — Z792 Long term (current) use of antibiotics: Secondary | ICD-10-CM | POA: Diagnosis not present

## 2021-02-20 DIAGNOSIS — R410 Disorientation, unspecified: Secondary | ICD-10-CM

## 2021-02-20 DIAGNOSIS — N183 Chronic kidney disease, stage 3 unspecified: Secondary | ICD-10-CM | POA: Diagnosis not present

## 2021-02-20 DIAGNOSIS — E870 Hyperosmolality and hypernatremia: Secondary | ICD-10-CM

## 2021-02-20 DIAGNOSIS — D696 Thrombocytopenia, unspecified: Secondary | ICD-10-CM | POA: Diagnosis not present

## 2021-02-20 DIAGNOSIS — D631 Anemia in chronic kidney disease: Secondary | ICD-10-CM

## 2021-02-20 LAB — POCT URINALYSIS DIP (CLINITEK)
Bilirubin, UA: NEGATIVE
Blood, UA: NEGATIVE
Glucose, UA: NEGATIVE mg/dL
Ketones, POC UA: NEGATIVE mg/dL
Leukocytes, UA: NEGATIVE
Nitrite, UA: NEGATIVE
POC PROTEIN,UA: NEGATIVE
Spec Grav, UA: 1.015 (ref 1.010–1.025)
Urobilinogen, UA: 0.2 E.U./dL
pH, UA: 5.5 (ref 5.0–8.0)

## 2021-02-20 NOTE — Progress Notes (Signed)
Acute Office Visit  Subjective:    Patient ID: Gloria Mckay, female    DOB: [birth year], 80 y.o.   MRN: [id number]  Chief Complaint  Patient presents with   Altered Mental Status    Patient is in today for new onset confusion  Patient is accompanied by daughter [name]), who assists in providing history  Patient reports that she thought there was someone in her apartment last night. Home health nurse stated she was not herself yesterday. She is feeling improved today and does not currently feel confused. Denies auditory or visual hallucinations.  Daughter wanted her checked for  UTI today.  She was treated for uncomplicated UTI by Urology last month with Macrobid 100 mg twice a day x 7 days and Trimpex 100 mg twice a day x 3 days. Urine culture was positive on 01/05/21 for 10-25k colonies of citrobacter amalonaticus resistant to cefazolin and tetracycylines  Reports "bad diarrhea" for the last 2 weeks She is on long-term Clindamycin suppression for a chronic prosthetic infection of left knee.  She was tested for C. Diff last week - negative on 02/13/21 She is following with GI and plans to repeat a C. Diff test  INR therapeutic yesterday at 2.3 She denies abnormal bruising or bleeding. Denies headache or vision change.  She is following with Oncology for anemia of chronic renal failure, B12 deficiency and myelodysplastic syndrome. Recently experienced low platelets  Labs 2 weeks ago on 02/06/21  Hgb 11.2, MCV 100  Plt 148 Scr 1.66, eGFR 31 Potassium 4.5 Sodium 145  Chloride 115 Calcium 8.5 CRP <1  Past Medical History:  Diagnosis Date   Arthritis    Cancer ([place])    Cataract    bilaterally   Hypertension    Myelodysplasia (myelodysplastic syndrome) ([place])    Myelodysplastic syndrome ([place])     Past Surgical History:  Procedure Laterality Date   JOINT REPLACEMENT     STOMACH SURGERY      Family History  Problem Relation Age of Onset   Hypertension Mother     Pulmonary embolism Mother    Heart disease Father     Social History   Socioeconomic History   Marital status: Widowed    Spouse name: Not on file   Number of children: 4   Years of education: 4   Highest education level: 12th grade  Occupational History   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    Social Determinants of Health   Financial Resource Strain: Low Risk    Difficulty of Paying Living Expenses: Not hard at all  Food Insecurity: No Food Insecurity   Worried About Charity fundraiser in the Last Year: Never true   [place] in the Last Year: Never true  Transportation Needs: No Transportation Needs   Lack of Transportation (Medical): No   Lack of Transportation (Non-Medical): No  Physical Activity: Inactive   Days of Exercise per Week: 0 days   Minutes of Exercise per Session: 0 min  Stress: Stress Concern Present   Feeling of Stress : To some  extent  Social Connections: Socially Isolated   Frequency of Communication with Friends and Family: More than three times a week   Frequency of Social Gatherings with Friends and Family: More than three times a week   Attends Religious Services: Never   Marine scientist or Organizations: No   Attends Archivist Meetings: Never   Marital Status: Widowed  Human resources officer Violence: Not At Risk   Fear of Current or Ex-Partner: No   Emotionally Abused: No   Physically Abused: No   Sexually Abused: No    Outpatient Medications Prior to Visit  Medication Sig Dispense Refill   AMBULATORY NON FORMULARY MEDICATION Medication Name: rollator  walker with seat per patient preference and insurance coverage 1 Units prn   amLODipine (NORVASC) 10 MG tablet Take 1 tablet (10 mg total) by mouth daily. 90 tablet 3   Calcium-Vitamin D-Vitamin K 950-932-67 MG-UNT-MCG CHEW Chew 2 tablets by mouth daily. 180 tablet 3   carvedilol (COREG) 25 MG tablet Take 1 tablet (25 mg total) by mouth 2 (two) times daily with a meal. 180 tablet 0   cholestyramine (QUESTRAN) 4 g packet Take by mouth.     clindamycin (CLEOCIN) 150 MG capsule Take by mouth.     cyanocobalamin (,VITAMIN B-12,) 1000 MCG/ML injection Inject 1 mL (1,000 mcg total) into the muscle every 30 (thirty) days. Please include syringes and needles as needed for monthly injections 30 mL 11   denosumab (PROLIA) 60 MG/ML SOSY injection Inject 60 mg into the skin every 6 (six) months. Administer in upper arm, thigh, or abdomen 1 Syringe 1   esomeprazole (NEXIUM) 40 MG capsule Take 1 capsule (40 mg total) by mouth 2 (two) times daily before a meal. 60 capsule 3   folic acid (FOLVITE) 1 MG tablet Take by mouth.     HYDROcodone-acetaminophen (NORCO/VICODIN) 5-325 MG tablet Take 1-2 tablets by mouth every 4 (four) hours as needed for moderate pain (while awake, maximum 5 tablets per 24 hours). 150 tablet 0   hydrOXYzine (ATARAX/VISTARIL) 25 MG tablet Take 0.5-1 tablets (12.5-25 mg total) by mouth every 8 (eight) hours as needed for itching. 30 tablet 0   losartan (COZAAR) 100 MG tablet Take 1 tablet (100 mg total) by mouth daily. 90 tablet 1   Omega-3 Fatty Acids ([employer] FISH OIL PO) Take by mouth.     Probiotic Product (MISC INTESTINAL FLORA REGULAT) CAPS Take 1 capsule by mouth daily as needed.     sucralfate (CARAFATE) 1 g tablet Take 1 tablet by mouth 4 (four) times daily -  with meals and at bedtime.     topiramate (TOPAMAX) 100 MG tablet Take 1.5 tabs (150 mg) in AM and 2 tabs (200 mg) in PM 90 tablet 1   warfarin (COUMADIN) 5 MG tablet take 1 tablet daily, except Thursdays take 1/2 tablet 90 tablet 0    fluconazole (DIFLUCAN) 100 MG tablet Take one tablet (100 mg dose) by mouth daily for 5 days. 5 tablet 0   No facility-administered medications prior to visit.    Allergies  Allergen Reactions   Avocado Shortness Of Breath   Cefuroxime Anaphylaxis    Difficulty swallowing pills because they were too dry.  Caused tablet dysphagia.   Diazepam Shortness Of Breath    HYPERVENTILATION      Diphenhydramine Palpitations    Affected heart rate/er told her not to take again   Latex Rash and Shortness Of Breath    Airway swelling  Other Palpitations    Affected heart rate/er told her not to take again   Amlodipine Cough and Other (See Comments)    Not sure   Butorphanol Hives   Diclofenac Hives   Diclofenac Sodium Hives   Diphenhydramine Hcl Palpitations    Affected heart rate/er told her not to take again   Methylpyrrolidone Hives   Ondansetron Hives and Other (See Comments)    Sedates/knocks her out     Oxycodone Hives and Nausea Only   Penicillins Hives and Swelling    Most mycin drugs   Quinolones Other (See Comments)    Joint pain   Sulfa Antibiotics Hives and Swelling   Tomato Other (See Comments)    Broke out inside of mouth   Butorphanol Tartrate Hives   Lisinopril Cough    Caused pt to cough   Nexium [Esomeprazole Magnesium] Diarrhea and Nausea And Vomiting   Ramipril Cough    Pt c/o cough   Ace Inhibitors Other (See Comments) and Cough    Lisinopril and ramipril    Ciprofloxacin Rash    Patient prefers not to take Fluoroquinolones    Codeine Nausea Only   Pantoprazole Sodium Diarrhea    Review of Systems  Gastrointestinal:  Positive for diarrhea.  Neurological:  Positive for weakness. Negative for dizziness, seizures, syncope, facial asymmetry, speech difficulty, numbness and headaches.  Psychiatric/Behavioral:  Positive for confusion and hallucinations. Negative for agitation and behavioral problems.   All other systems reviewed and are negative.      Objective:    Physical Exam Vitals reviewed.  Constitutional:      Appearance: She is not ill-appearing or toxic-appearing.  HENT:     Head: Normocephalic and atraumatic.  Eyes:     Conjunctiva/sclera: Conjunctivae normal.     Pupils: Pupils are equal, round, and reactive to light.  Cardiovascular:     Rate and Rhythm: Regular rhythm. Bradycardia present.     Heart sounds: Normal heart sounds.  Pulmonary:     Effort: Pulmonary effort is normal.     Breath sounds: Normal breath sounds.  Musculoskeletal:     Left lower leg: Swelling present. No tenderness. Edema present.     Comments: Seated in wheelchair, gait not assessed, upper and lower extremity strength 4/5 and symmetric  Skin:    General: Skin is warm and dry.     Coloration: Skin is not jaundiced or pale.  Neurological:     Mental Status: She is alert and oriented to person, place, and time.     GCS: GCS eye subscore is 4. GCS verbal subscore is 5. GCS motor subscore is 6.     Cranial Nerves: Cranial nerves are intact.     Motor: Motor function is intact.  Psychiatric:        Mood and Affect: Affect normal.        Speech: Speech normal.        Behavior: Behavior normal.        Thought Content: Thought content normal.     BP (!) 161/70   Pulse (!) 59   Temp 98.2 F (36.8 C) (Oral)   SpO2 99%   BP Readings from Last 3 Encounters:  02/20/21 (!) 161/70  12/11/20 120/60  12/04/20 (!) 161/80   Pulse Readings from Last 3 Encounters:  02/20/21 (!) 59  12/04/20 60  12/01/20 (!) 59     Health Maintenance Due  Topic Date Due   COVID-19 Vaccine (1) Never done   Zoster  Vaccines- Shingrix (1 of 2) Never done    There are no preventive care reminders to display for this patient.   Lab Results  Component Value Date   TSH 1.65 02/08/2020   Lab Results  Component Value Date   WBC 7.7 02/08/2020   HGB 10.1 (L) 02/08/2020   HCT 32.7 (L) 02/08/2020   MCV 90.3 02/08/2020   PLT 199 02/08/2020   Lab Results   Component Value Date   NA 145 03/27/2020   K 5.2 03/27/2020   CO2 17 (L) 03/27/2020   GLUCOSE 104 (H) 03/27/2020   BUN 46 (H) 03/27/2020   CREATININE 1.89 (H) 03/27/2020   BILITOT 0.2 03/27/2020   ALKPHOS 72 01/05/2017   AST 15 03/27/2020   ALT 12 03/27/2020   PROT 5.8 (L) 03/27/2020   ALBUMIN 3.9 12/27/2018   CALCIUM 8.2 (L) 03/27/2020   Lab Results  Component Value Date   ALT 12 03/27/2020   AST 15 03/27/2020   ALKPHOS 72 01/05/2017   BILITOT 0.2 03/27/2020   Recent Results (from the past 2160 hour(s))  INR/PT     Status: Abnormal   Collection Time: 12/01/20  3:52 PM  Result Value Ref Range   INR 10.7 (HH)     Comment: Verified by repeat analysis. Marland Kitchen Reference Range                     0.9-1.1 Moderate-intensity Warfarin Therapy 2.0-3.0 Higher-intensity Warfarin Therapy   3.0-4.0  .    Prothrombin Time 93.6 (H) 9.0 - 11.5 sec    Comment: For additional information, please refer to http://education.questdiagnostics.com/faq/FAQ104 (This link is being provided for informational/ educational purposes only.)   Magnesium     Status: None   Collection Time: 12/03/20 12:00 AM  Result Value Ref Range   Magnesium 2.1 1.5 - 2.5 mg/dL  Methylmalonic acid, serum     Status: None   Collection Time: 12/03/20 12:00 AM  Result Value Ref Range   Methylmalonic Acid, Quant 239 87 - 318 nmol/L    Comment: . This test was developed and its analytical performance characteristics have been determined by [place], [place]. It has not been cleared or approved by the U.S. Food and Drug Administration. This assay has been validated pursuant to the CLIA regulations and is used for clinical purposes. .   Phosphorus     Status: None   Collection Time: 12/03/20 12:00 AM  Result Value Ref Range   Phosphorus 4.0 2.1 - 4.3 mg/dL  Homocysteine     Status: Abnormal   Collection Time: 12/03/20 12:00 AM  Result Value Ref Range   Homocysteine 38.4 (H) <10.4  umol/L    Comment: Homocysteine is increased by functional deficiency of  folate or vitamin B12. Testing for methylmalonic acid  differentiates between these deficiencies. Other causes  of increased homocysteine include renal failure, folate  antagonists such as methotrexate and phenytoin, and  exposure to nitrous oxide. Frazier Richards, et al., Ann Intern Med. 1999;131(5):331-9.   POCT INR     Status: None   Collection Time: 12/11/20  3:03 PM  Result Value Ref Range   INR 2.2 2.0 - 3.0  POCT INR     Status: Abnormal   Collection Time: 12/23/20 12:00 PM  Result Value Ref Range   INR 5.7 (A) 2 - 3  POCT INR     Status: None   Collection Time: 12/25/20  2:00 PM  Result Value Ref Range  INR 2.1 2 - 3  POCT INR     Status: None   Collection Time: 01/01/21  1:00 PM  Result Value Ref Range   INR 2.9 2 - 3  POCT INR     Status: None   Collection Time: 01/08/21 12:00 PM  Result Value Ref Range   INR 2.0 2 - 3  POCT INR     Status: None   Collection Time: 01/15/21  1:00 PM  Result Value Ref Range   INR 2.4 2 - 3  POCT INR     Status: None   Collection Time: 01/22/21  1:00 PM  Result Value Ref Range   INR 2.3 2 - 3  POCT INR     Status: Abnormal   Collection Time: 02/05/21  1:00 PM  Result Value Ref Range   INR 1.7 (A) 2 - 3  POCT INR     Status: Abnormal   Collection Time: 02/12/21 12:00 PM  Result Value Ref Range   INR 1.7 (A) 2 - 3  POCT INR     Status: None   Collection Time: 02/19/21 11:00 AM  Result Value Ref Range   INR 2.5 2 - 3  POCT URINALYSIS DIP (CLINITEK)     Status: Normal   Collection Time: 02/20/21  3:25 PM  Result Value Ref Range   Color, UA yellow yellow   Clarity, UA clear clear   Glucose, UA negative negative mg/dL   Bilirubin, UA negative negative   Ketones, POC UA negative negative mg/dL   Spec Grav, UA 1.015 1.010 - 1.025   Blood, UA negative negative   pH, UA 5.5 5.0 - 8.0   POC PROTEIN,UA negative negative, trace   Urobilinogen, UA 0.2 0.2 or 1.0  E.U./dL   Nitrite, UA Negative Negative   Leukocytes, UA Negative Negative       Assessment & Plan:   Problem List Items Addressed This Visit       Genitourinary   CKD (chronic kidney disease) stage 3, GFR 30-59 ml/min (HCC)   Relevant Orders   Urine Culture   CBC with Differential/Platelet   COMPLETE METABOLIC PANEL WITH GFR   Ammonia   Anemia of chronic kidney failure, stage 3 (moderate) (HCC)   Relevant Medications   folic acid (FOLVITE) 1 MG tablet   Other Relevant Orders   CBC with Differential/Platelet   COMPLETE METABOLIC PANEL WITH GFR   Ammonia     Other   Chronic hypernatremia   Relevant Orders   COMPLETE METABOLIC PANEL WITH GFR   Thrombocytopenia (HCC)   Relevant Orders   CBC with Differential/Platelet   Chronic antibiotic suppression   Other Visit Diagnoses     Confusion    -  Primary   Relevant Orders   POCT URINALYSIS DIP (CLINITEK) (Completed)   Urine Culture   CBC with Differential/Platelet   COMPLETE METABOLIC PANEL WITH GFR   Ammonia   Hypocalcemia       Relevant Orders   CBC with Differential/Platelet   COMPLETE METABOLIC PANEL WITH GFR   Ammonia      79YO with complex PMH of chronic renal failure, anemia of chronic disease, chronic hypernatremia, myelodysplastic syndrome, thrombocytopenia chronic prosthetic joint infection on suppressive Clindamycin, hx of AFIB and DVT on anticoagulation therapy presenting with history of transient confusion yesterday (resolved today)  Patient is GCS15 on exam and able to provide her own history and reports feeling back to baseline cognitive function today. She is afebrile and  without hypoxia  UA personally reviewed and negative. Urine culture pending. Will defer empiric ABX due to active diarrhea and work-up for C. diff  Ordered CMP, CBC/d, ammonia - patient was a tough stick and phlebotomist unable to obtain the correct volume. Patient will return for labs on Monday or have completed with Oncology at  next follow-up  Encouraged PO hydration and to prevent dehydration from diarrhea Transient confusion could be nutritional due to CKD/B12 deficiency or related to medication adverse effects - reviewed meds and patient has been on long-term Topiramate and reports has not taken opioid yesterday or today Counseled patient and daughter on ER precautions  No orders of the defined types were placed in this encounter.    Trixie Dredge, [place]

## 2021-02-20 NOTE — Patient Instructions (Signed)
Confusion Confusion is the inability to think with your usual speed or clarity. Confusion can be caused by many things. People who are confused often describe their thinking as cloudy or unclear. Confusion can also include feeling disoriented. This means you are unaware of where you are or who you are. You may also not know the date or time. When confused, you may have trouble remembering, paying attention, or making decisions. Some people also act aggressively when they areconfused. In some cases, confusion may come on quickly. In other cases, it may developslowly over time. Confusion may be caused by medical conditions such as: Infections, such as a urinary tract infection (UTI). Low levels of oxygen, which can develop from conditions such as long-term lung disorders. Decrease in brain function due to dementia and other conditions that affect the brain, such as seizures, strokes, brain tumors, or head injuries. Mental health conditions, like panic attacks, anxiety, depression, and hallucinations. Confusion may also be caused by physical factors such as: Loss of fluid (dehydration) or an imbalance of salts and minerals in the body (electrolytes). Lack of certain nutrients like niacin, thiamine, or other B vitamins. Fever or hypothermia, which is a sudden drop in body temperature. Low or high blood sugar. Low or high blood pressure. Other causes include: Lack of sleep or changes in routine or surroundings, such as when traveling or staying in a hospital. Using too much alcohol, drugs, or medicine. Side effects of medicines, or taking medicines that affect other medicines (drug interactions). Follow these instructions at home: Pay attention to your symptoms. Tell your health care provider about any changes or if you develop new symptoms. Follow these instructions to control ortreat symptoms. Ask a family member or friend for help if needed. Medicines  Take over-the-counter and prescription  medicines only as told by your health care provider. Ask your health care provider about changing or stopping any medicines that may be causing your confusion. Avoid pain medicines or sleep medicines until you have fully recovered. Use a pillbox or an alarm to help you take the right medicines at the right time.  Lifestyle  Eat a balanced diet that includes fruits and vegetables. Get enough sleep. For most adults, this is 7-9 hours each night. Do not drink alcohol. Do not become isolated. Spend time with other people and make plans for your days. Do not drive until your health care provider says that it is safe to do so. Do not use any products that contain nicotine or tobacco, such as cigarettes, e-cigarettes, and chewing tobacco. If you need help quitting, ask your health care provider. Stop other activities that may increase your chances of getting hurt. These may include some work duties, sports activities, swimming, or bike riding. Ask your health care provider what activities are safe for you.  Tips for caregivers Find out if the person is confused. Ask the person to state his or her name, age, and the date. If the person is unsure or answers incorrectly, he or she may be confused and need assistance. Always introduce yourself, no matter how well the person knows you. Remind the person of his or her location. Place a calendar and clock near the person who is confused. Keep a regular schedule. Make sure the person has plenty of light during the day and sleep at night. Talk about current events and plans for the day. Keep the environment calm, quiet, and peaceful. Help the person do the things that he or she is unable to do.   These include: Taking medicines. Keeping medical appointments. Helping with household duties, including meal preparation. Running errands. Get help if you need it. There are several support groups for caregivers. If the person you are helping needs more support,  consider day care, extended-care programs, or a skilled nursing facility. The person's health care provider may be able to help evaluate these options. General instructions Monitor yourself for any conditions you may have. These can include: Checking your blood glucose levels if you have diabetes. Maintaining a healthy weight. Monitoring your blood pressure if you have hypertension. Monitoring your body temperature if you have a fever. Keep all follow-up visits. This is important. Contact a health care provider if: You have new symptoms or your symptoms get worse. Get help right away if you: Feel that you are not able to care for yourself. Develop severe headaches, repeated vomiting, seizures, blackouts, or slurred speech. Have increasing confusion, weakness, numbness, restlessness, or personality changes. Develop a loss of balance, have marked dizziness, feel uncoordinated, or fall. Develop severe anxiety, or you have delusions or hallucinations. These symptoms may represent a serious problem that is an emergency. Do not wait to see if the symptoms will go away. Get medical help right away. Call your local emergency services (911 in the [place] Do not drive yourself to the hospital. Summary Confusion is the inability to think with your usual speed or clarity. People who are confused often describe their thinking as cloudy or unclear. Confusion can also include having trouble remembering, paying attention, or making decisions. Confusion may come on quickly or develop slowly over time, depending on the cause. There are many different causes of confusion. Ask for help from family members or friends if you are unable to take care of yourself. This information is not intended to replace advice given to you by your health care provider. Make sure you discuss any questions you have with your healthcare provider. Document Revised: 12/11/2019 Document Reviewed: 12/11/2019 Elsevier Patient Education   2022 Elsevier Inc.  

## 2021-02-22 LAB — URINE CULTURE
MICRO NUMBER:: 12049680
SPECIMEN QUALITY:: ADEQUATE

## 2021-02-23 ENCOUNTER — Telehealth: Payer: Self-pay | Admitting: Physician Assistant

## 2021-02-23 DIAGNOSIS — N390 Urinary tract infection, site not specified: Secondary | ICD-10-CM

## 2021-02-23 DIAGNOSIS — B952 Enterococcus as the cause of diseases classified elsewhere: Secondary | ICD-10-CM | POA: Insufficient documentation

## 2021-02-23 MED ORDER — NITROFURANTOIN MONOHYD MACRO 100 MG PO CAPS: 100.0000 mg | ORAL_CAPSULE | Freq: Two times a day (BID) | ORAL | 0 refills | Status: AC

## 2021-02-23 NOTE — Telephone Encounter (Signed)
Pt notified of results and rx. 

## 2021-02-23 NOTE — Telephone Encounter (Signed)
Urine culture was positive for enterococcus Antibiotic (Macrobid) sent to [facility] Take twice a day for 7 days

## 2021-02-26 ENCOUNTER — Ambulatory Visit (INDEPENDENT_AMBULATORY_CARE_PROVIDER_SITE_OTHER): Payer: Medicare Other | Admitting: Pharmacist

## 2021-02-26 ENCOUNTER — Other Ambulatory Visit: Payer: Self-pay

## 2021-02-26 DIAGNOSIS — D6859 Other primary thrombophilia: Secondary | ICD-10-CM

## 2021-02-26 DIAGNOSIS — Z7901 Long term (current) use of anticoagulants: Secondary | ICD-10-CM | POA: Diagnosis not present

## 2021-02-26 DIAGNOSIS — I4891 Unspecified atrial fibrillation: Secondary | ICD-10-CM | POA: Diagnosis not present

## 2021-02-26 LAB — POCT INR: INR: 4.3 — AB (ref 2–3)

## 2021-02-26 NOTE — Progress Notes (Signed)
Subjective:    Gloria Mckay is a 80 y.o. female here for follow-up of chronic anticoagulation for atrial fibrillation and hx DVT . Bleeding signs/symptoms:  None Thromboembolic signs/symptoms:  None  Missed Coumadin doses: None Medication changes: nitrofurantoin 7d course for UTI Dietary changes: no Bacterial/viral infection: yes - UTI Other concerns: persistent diarrhea, cdiff negative  Objective:    Current warfarin (COUMADIN) dose: warfarin 7.5mg  on Thursdays, 5mg  all other days Lab Results  Component Value Date   INR 2.5 02/19/2021   INR 1.7 (A) 02/12/2021   INR 1.7 (A) 02/05/2021   INR 10.7 (HH) 12/01/2020   INR 1.2 (A) 09/04/2020   INR 1.2 (A) 08/21/2020       Assessment:    Supratherapeutic INR for goal of 2-3    Plan:    Dose: SKIP dose tonight 02/26/21, then tomorrow resume 5mg  daily Next INR: 1 week

## 2021-03-03 DIAGNOSIS — E538 Deficiency of other specified B group vitamins: Secondary | ICD-10-CM | POA: Diagnosis not present

## 2021-03-03 DIAGNOSIS — E870 Hyperosmolality and hypernatremia: Secondary | ICD-10-CM | POA: Diagnosis not present

## 2021-03-03 DIAGNOSIS — K219 Gastro-esophageal reflux disease without esophagitis: Secondary | ICD-10-CM | POA: Diagnosis not present

## 2021-03-03 DIAGNOSIS — Z7952 Long term (current) use of systemic steroids: Secondary | ICD-10-CM | POA: Diagnosis not present

## 2021-03-03 DIAGNOSIS — K5730 Diverticulosis of large intestine without perforation or abscess without bleeding: Secondary | ICD-10-CM | POA: Diagnosis not present

## 2021-03-03 DIAGNOSIS — M5136 Other intervertebral disc degeneration, lumbar region: Secondary | ICD-10-CM | POA: Diagnosis not present

## 2021-03-03 DIAGNOSIS — I482 Chronic atrial fibrillation, unspecified: Secondary | ICD-10-CM | POA: Diagnosis not present

## 2021-03-03 DIAGNOSIS — Z86718 Personal history of other venous thrombosis and embolism: Secondary | ICD-10-CM | POA: Diagnosis not present

## 2021-03-03 DIAGNOSIS — N189 Chronic kidney disease, unspecified: Secondary | ICD-10-CM | POA: Diagnosis not present

## 2021-03-03 DIAGNOSIS — M109 Gout, unspecified: Secondary | ICD-10-CM | POA: Diagnosis not present

## 2021-03-03 DIAGNOSIS — I130 Hypertensive heart and chronic kidney disease with heart failure and stage 1 through stage 4 chronic kidney disease, or unspecified chronic kidney disease: Secondary | ICD-10-CM | POA: Diagnosis not present

## 2021-03-03 DIAGNOSIS — D631 Anemia in chronic kidney disease: Secondary | ICD-10-CM | POA: Diagnosis not present

## 2021-03-03 DIAGNOSIS — Z7901 Long term (current) use of anticoagulants: Secondary | ICD-10-CM | POA: Diagnosis not present

## 2021-03-03 DIAGNOSIS — J4521 Mild intermittent asthma with (acute) exacerbation: Secondary | ICD-10-CM | POA: Diagnosis not present

## 2021-03-03 DIAGNOSIS — M5030 Other cervical disc degeneration, unspecified cervical region: Secondary | ICD-10-CM | POA: Diagnosis not present

## 2021-03-03 DIAGNOSIS — M810 Age-related osteoporosis without current pathological fracture: Secondary | ICD-10-CM | POA: Diagnosis not present

## 2021-03-03 DIAGNOSIS — M48061 Spinal stenosis, lumbar region without neurogenic claudication: Secondary | ICD-10-CM | POA: Diagnosis not present

## 2021-03-03 DIAGNOSIS — Z86711 Personal history of pulmonary embolism: Secondary | ICD-10-CM | POA: Diagnosis not present

## 2021-03-03 DIAGNOSIS — H3530 Unspecified macular degeneration: Secondary | ICD-10-CM | POA: Diagnosis not present

## 2021-03-03 DIAGNOSIS — D469 Myelodysplastic syndrome, unspecified: Secondary | ICD-10-CM | POA: Diagnosis not present

## 2021-03-03 DIAGNOSIS — T8454XD Infection and inflammatory reaction due to internal left knee prosthesis, subsequent encounter: Secondary | ICD-10-CM | POA: Diagnosis not present

## 2021-03-03 DIAGNOSIS — D500 Iron deficiency anemia secondary to blood loss (chronic): Secondary | ICD-10-CM | POA: Diagnosis not present

## 2021-03-03 DIAGNOSIS — I509 Heart failure, unspecified: Secondary | ICD-10-CM | POA: Diagnosis not present

## 2021-03-05 ENCOUNTER — Other Ambulatory Visit: Payer: Self-pay

## 2021-03-05 ENCOUNTER — Ambulatory Visit (INDEPENDENT_AMBULATORY_CARE_PROVIDER_SITE_OTHER): Payer: Medicare Other | Admitting: Pharmacist

## 2021-03-05 DIAGNOSIS — Z86711 Personal history of pulmonary embolism: Secondary | ICD-10-CM | POA: Diagnosis not present

## 2021-03-05 DIAGNOSIS — I4891 Unspecified atrial fibrillation: Secondary | ICD-10-CM

## 2021-03-05 DIAGNOSIS — Z7901 Long term (current) use of anticoagulants: Secondary | ICD-10-CM

## 2021-03-05 DIAGNOSIS — D500 Iron deficiency anemia secondary to blood loss (chronic): Secondary | ICD-10-CM | POA: Diagnosis not present

## 2021-03-05 DIAGNOSIS — K5730 Diverticulosis of large intestine without perforation or abscess without bleeding: Secondary | ICD-10-CM | POA: Diagnosis not present

## 2021-03-05 DIAGNOSIS — I130 Hypertensive heart and chronic kidney disease with heart failure and stage 1 through stage 4 chronic kidney disease, or unspecified chronic kidney disease: Secondary | ICD-10-CM | POA: Diagnosis not present

## 2021-03-05 DIAGNOSIS — K219 Gastro-esophageal reflux disease without esophagitis: Secondary | ICD-10-CM | POA: Diagnosis not present

## 2021-03-05 DIAGNOSIS — M5136 Other intervertebral disc degeneration, lumbar region: Secondary | ICD-10-CM | POA: Diagnosis not present

## 2021-03-05 DIAGNOSIS — I509 Heart failure, unspecified: Secondary | ICD-10-CM | POA: Diagnosis not present

## 2021-03-05 DIAGNOSIS — D631 Anemia in chronic kidney disease: Secondary | ICD-10-CM | POA: Diagnosis not present

## 2021-03-05 DIAGNOSIS — T8454XD Infection and inflammatory reaction due to internal left knee prosthesis, subsequent encounter: Secondary | ICD-10-CM | POA: Diagnosis not present

## 2021-03-05 DIAGNOSIS — D469 Myelodysplastic syndrome, unspecified: Secondary | ICD-10-CM | POA: Diagnosis not present

## 2021-03-05 DIAGNOSIS — M48061 Spinal stenosis, lumbar region without neurogenic claudication: Secondary | ICD-10-CM | POA: Diagnosis not present

## 2021-03-05 DIAGNOSIS — Z86718 Personal history of other venous thrombosis and embolism: Secondary | ICD-10-CM | POA: Diagnosis not present

## 2021-03-05 DIAGNOSIS — H3530 Unspecified macular degeneration: Secondary | ICD-10-CM | POA: Diagnosis not present

## 2021-03-05 DIAGNOSIS — D6859 Other primary thrombophilia: Secondary | ICD-10-CM

## 2021-03-05 DIAGNOSIS — N189 Chronic kidney disease, unspecified: Secondary | ICD-10-CM | POA: Diagnosis not present

## 2021-03-05 DIAGNOSIS — M109 Gout, unspecified: Secondary | ICD-10-CM | POA: Diagnosis not present

## 2021-03-05 DIAGNOSIS — I482 Chronic atrial fibrillation, unspecified: Secondary | ICD-10-CM | POA: Diagnosis not present

## 2021-03-05 DIAGNOSIS — Z7952 Long term (current) use of systemic steroids: Secondary | ICD-10-CM | POA: Diagnosis not present

## 2021-03-05 DIAGNOSIS — E538 Deficiency of other specified B group vitamins: Secondary | ICD-10-CM | POA: Diagnosis not present

## 2021-03-05 DIAGNOSIS — M5030 Other cervical disc degeneration, unspecified cervical region: Secondary | ICD-10-CM | POA: Diagnosis not present

## 2021-03-05 DIAGNOSIS — J4521 Mild intermittent asthma with (acute) exacerbation: Secondary | ICD-10-CM | POA: Diagnosis not present

## 2021-03-05 DIAGNOSIS — M810 Age-related osteoporosis without current pathological fracture: Secondary | ICD-10-CM | POA: Diagnosis not present

## 2021-03-05 DIAGNOSIS — E870 Hyperosmolality and hypernatremia: Secondary | ICD-10-CM | POA: Diagnosis not present

## 2021-03-05 LAB — CBC AND DIFFERENTIAL
HCT: 38 (ref 36–46)
Hemoglobin: 11.8 — AB (ref 12.0–16.0)
Platelets: 288 (ref 150–399)
WBC: 11.2

## 2021-03-05 LAB — POCT INR: INR: 3.3 — AB (ref 2–3)

## 2021-03-05 LAB — CBC: RBC: 3.7 — AB (ref 3.87–5.11)

## 2021-03-05 NOTE — Progress Notes (Signed)
Subjective:    Gloria Mckay is a 80 y.o. female here for follow-up of chronic anticoagulation for atrial fibrillation and hx DVT . Bleeding signs/symptoms:  None Thromboembolic signs/symptoms:  None  Missed Coumadin doses: None Medication changes: no Dietary changes: no Bacterial/viral infection: no Other concerns: no    Objective:    Current warfarin (COUMADIN) dose: HELD dose last Thursday 02/19/21, then warfarin 5mg  daily.  Lab Results  Component Value Date   INR 3.3 (A) 03/05/2021   INR 4.3 (A) 02/26/2021   INR 2.5 02/19/2021   INR 10.7 (HH) 12/01/2020   INR 1.2 (A) 09/04/2020   INR 1.2 (A) 08/21/2020       Assessment:    Supratherapeutic INR for goal of 2-3    Plan:    Dose: Trend in favorable direction! Adjust to 2.5mg  tonight, 03/05/21, then warfarin 5mg  daily thereafter. Next INR: 1 week per nurse visit, home health services are complete. Will discuss (with PCP & supporting staff) initiation of home INR POCT machine for daughter/granddaughter to utilize since Gloria Mckay has a hard time getting physically to appts.

## 2021-03-06 NOTE — Progress Notes (Signed)
Left voicemail message for patient to call back and get INR nurse visit scheduled. AM

## 2021-03-09 ENCOUNTER — Telehealth: Payer: Self-pay | Admitting: *Deleted

## 2021-03-09 NOTE — Telephone Encounter (Signed)
Tabitha from [facility] left a vm that she called last week with INR results of 3.3 and never received a call back.

## 2021-03-10 ENCOUNTER — Telehealth: Payer: Self-pay

## 2021-03-10 DIAGNOSIS — M5030 Other cervical disc degeneration, unspecified cervical region: Secondary | ICD-10-CM | POA: Diagnosis not present

## 2021-03-10 DIAGNOSIS — M48061 Spinal stenosis, lumbar region without neurogenic claudication: Secondary | ICD-10-CM | POA: Diagnosis not present

## 2021-03-10 DIAGNOSIS — E870 Hyperosmolality and hypernatremia: Secondary | ICD-10-CM | POA: Diagnosis not present

## 2021-03-10 DIAGNOSIS — I482 Chronic atrial fibrillation, unspecified: Secondary | ICD-10-CM | POA: Diagnosis not present

## 2021-03-10 DIAGNOSIS — Z7901 Long term (current) use of anticoagulants: Secondary | ICD-10-CM | POA: Diagnosis not present

## 2021-03-10 DIAGNOSIS — K5730 Diverticulosis of large intestine without perforation or abscess without bleeding: Secondary | ICD-10-CM | POA: Diagnosis not present

## 2021-03-10 DIAGNOSIS — Z86718 Personal history of other venous thrombosis and embolism: Secondary | ICD-10-CM | POA: Diagnosis not present

## 2021-03-10 DIAGNOSIS — D500 Iron deficiency anemia secondary to blood loss (chronic): Secondary | ICD-10-CM | POA: Diagnosis not present

## 2021-03-10 DIAGNOSIS — M109 Gout, unspecified: Secondary | ICD-10-CM | POA: Diagnosis not present

## 2021-03-10 DIAGNOSIS — N189 Chronic kidney disease, unspecified: Secondary | ICD-10-CM | POA: Diagnosis not present

## 2021-03-10 DIAGNOSIS — Z86711 Personal history of pulmonary embolism: Secondary | ICD-10-CM | POA: Diagnosis not present

## 2021-03-10 DIAGNOSIS — I509 Heart failure, unspecified: Secondary | ICD-10-CM | POA: Diagnosis not present

## 2021-03-10 DIAGNOSIS — J4521 Mild intermittent asthma with (acute) exacerbation: Secondary | ICD-10-CM | POA: Diagnosis not present

## 2021-03-10 DIAGNOSIS — Z7952 Long term (current) use of systemic steroids: Secondary | ICD-10-CM | POA: Diagnosis not present

## 2021-03-10 DIAGNOSIS — M5136 Other intervertebral disc degeneration, lumbar region: Secondary | ICD-10-CM | POA: Diagnosis not present

## 2021-03-10 DIAGNOSIS — D469 Myelodysplastic syndrome, unspecified: Secondary | ICD-10-CM | POA: Diagnosis not present

## 2021-03-10 DIAGNOSIS — K219 Gastro-esophageal reflux disease without esophagitis: Secondary | ICD-10-CM | POA: Diagnosis not present

## 2021-03-10 DIAGNOSIS — M810 Age-related osteoporosis without current pathological fracture: Secondary | ICD-10-CM | POA: Diagnosis not present

## 2021-03-10 DIAGNOSIS — I130 Hypertensive heart and chronic kidney disease with heart failure and stage 1 through stage 4 chronic kidney disease, or unspecified chronic kidney disease: Secondary | ICD-10-CM | POA: Diagnosis not present

## 2021-03-10 DIAGNOSIS — H3530 Unspecified macular degeneration: Secondary | ICD-10-CM | POA: Diagnosis not present

## 2021-03-10 DIAGNOSIS — E538 Deficiency of other specified B group vitamins: Secondary | ICD-10-CM | POA: Diagnosis not present

## 2021-03-10 DIAGNOSIS — T8454XD Infection and inflammatory reaction due to internal left knee prosthesis, subsequent encounter: Secondary | ICD-10-CM | POA: Diagnosis not present

## 2021-03-10 DIAGNOSIS — D631 Anemia in chronic kidney disease: Secondary | ICD-10-CM | POA: Diagnosis not present

## 2021-03-10 NOTE — Chronic Care Management (AMB) (Signed)
  Chronic Care Management   Outreach Note  03/10/2021 Name: Gloria Mckay MRN: [id number] DOB: 1941/05/27  Gloria Mckay is a 80 y.o. year old female who is a primary care patient of Gloria Reeve, Gloria Mckay. I reached out to Gloria Mckay by phone today in response to a referral sent by Gloria Mckay PCP, Gloria Reeve, Gloria Mckay      An unsuccessful telephone outreach was attempted today. The patient was referred to the case management team for assistance with care management and care coordination.   Follow Up Plan: A HIPAA compliant phone message was left for the patient providing contact information and requesting a return call.  The care management team will reach out to the patient again over the next 7 days.  If patient returns call to provider office, please advise to call [facility] * at [phone]*  Noreene Larsson, [place], [place] [facility]  [place],  [address] Direct Dial: [phone] [email or web address]: [email or web address]

## 2021-03-12 ENCOUNTER — Ambulatory Visit: Payer: Medicare Other

## 2021-03-12 ENCOUNTER — Telehealth: Payer: Self-pay

## 2021-03-12 ENCOUNTER — Other Ambulatory Visit: Payer: Self-pay

## 2021-03-12 ENCOUNTER — Ambulatory Visit (INDEPENDENT_AMBULATORY_CARE_PROVIDER_SITE_OTHER): Payer: Medicare Other | Admitting: Pharmacist

## 2021-03-12 DIAGNOSIS — T8454XD Infection and inflammatory reaction due to internal left knee prosthesis, subsequent encounter: Secondary | ICD-10-CM | POA: Diagnosis not present

## 2021-03-12 DIAGNOSIS — M5136 Other intervertebral disc degeneration, lumbar region: Secondary | ICD-10-CM | POA: Diagnosis not present

## 2021-03-12 DIAGNOSIS — M109 Gout, unspecified: Secondary | ICD-10-CM | POA: Diagnosis not present

## 2021-03-12 DIAGNOSIS — N189 Chronic kidney disease, unspecified: Secondary | ICD-10-CM | POA: Diagnosis not present

## 2021-03-12 DIAGNOSIS — E538 Deficiency of other specified B group vitamins: Secondary | ICD-10-CM | POA: Diagnosis not present

## 2021-03-12 DIAGNOSIS — M5030 Other cervical disc degeneration, unspecified cervical region: Secondary | ICD-10-CM | POA: Diagnosis not present

## 2021-03-12 DIAGNOSIS — Z86711 Personal history of pulmonary embolism: Secondary | ICD-10-CM | POA: Diagnosis not present

## 2021-03-12 DIAGNOSIS — Z7901 Long term (current) use of anticoagulants: Secondary | ICD-10-CM | POA: Diagnosis not present

## 2021-03-12 DIAGNOSIS — I482 Chronic atrial fibrillation, unspecified: Secondary | ICD-10-CM | POA: Diagnosis not present

## 2021-03-12 DIAGNOSIS — K219 Gastro-esophageal reflux disease without esophagitis: Secondary | ICD-10-CM | POA: Diagnosis not present

## 2021-03-12 DIAGNOSIS — M48061 Spinal stenosis, lumbar region without neurogenic claudication: Secondary | ICD-10-CM | POA: Diagnosis not present

## 2021-03-12 DIAGNOSIS — D500 Iron deficiency anemia secondary to blood loss (chronic): Secondary | ICD-10-CM | POA: Diagnosis not present

## 2021-03-12 DIAGNOSIS — M810 Age-related osteoporosis without current pathological fracture: Secondary | ICD-10-CM | POA: Diagnosis not present

## 2021-03-12 DIAGNOSIS — Z7952 Long term (current) use of systemic steroids: Secondary | ICD-10-CM | POA: Diagnosis not present

## 2021-03-12 DIAGNOSIS — K5730 Diverticulosis of large intestine without perforation or abscess without bleeding: Secondary | ICD-10-CM | POA: Diagnosis not present

## 2021-03-12 DIAGNOSIS — D469 Myelodysplastic syndrome, unspecified: Secondary | ICD-10-CM | POA: Diagnosis not present

## 2021-03-12 DIAGNOSIS — Z86718 Personal history of other venous thrombosis and embolism: Secondary | ICD-10-CM | POA: Diagnosis not present

## 2021-03-12 DIAGNOSIS — I4891 Unspecified atrial fibrillation: Secondary | ICD-10-CM

## 2021-03-12 DIAGNOSIS — D6859 Other primary thrombophilia: Secondary | ICD-10-CM

## 2021-03-12 DIAGNOSIS — D631 Anemia in chronic kidney disease: Secondary | ICD-10-CM | POA: Diagnosis not present

## 2021-03-12 DIAGNOSIS — I130 Hypertensive heart and chronic kidney disease with heart failure and stage 1 through stage 4 chronic kidney disease, or unspecified chronic kidney disease: Secondary | ICD-10-CM | POA: Diagnosis not present

## 2021-03-12 DIAGNOSIS — H3530 Unspecified macular degeneration: Secondary | ICD-10-CM | POA: Diagnosis not present

## 2021-03-12 DIAGNOSIS — J4521 Mild intermittent asthma with (acute) exacerbation: Secondary | ICD-10-CM | POA: Diagnosis not present

## 2021-03-12 DIAGNOSIS — E870 Hyperosmolality and hypernatremia: Secondary | ICD-10-CM | POA: Diagnosis not present

## 2021-03-12 DIAGNOSIS — I509 Heart failure, unspecified: Secondary | ICD-10-CM | POA: Diagnosis not present

## 2021-03-12 LAB — POCT INR: INR: 1.6 — AB (ref 2–3)

## 2021-03-12 NOTE — Telephone Encounter (Signed)
Gloria Mckay with [facility] called to see if we had gotten the results of the labs that they drew a week or so ago. Also, they did an INR today and the result was 1.6.

## 2021-03-12 NOTE — Progress Notes (Signed)
Subjective:    Gloria Mckay is a 80 y.o. female here for follow-up of chronic anticoagulation for atrial fibrillation and hx DVT . Bleeding signs/symptoms:  None Thromboembolic signs/symptoms:  None  Missed Coumadin doses: None Medication changes: no Dietary changes: no Bacterial/viral infection: no Other concerns: no    Objective:    Current warfarin (COUMADIN) dose: Took 2.5mg  x1 last Thursday, then 5mg  daily Lab Results  Component Value Date   INR 3.3 (A) 03/05/2021   INR 4.3 (A) 02/26/2021   INR 2.5 02/19/2021   INR 10.7 (HH) 12/01/2020   INR 1.2 (A) 09/04/2020   INR 1.2 (A) 08/21/2020       Assessment:    Subtherapeutic INR for goal of 2-3    Plan:    Dose: Warfarin 5mg  daily Next INR: 1 week

## 2021-03-17 DIAGNOSIS — H3530 Unspecified macular degeneration: Secondary | ICD-10-CM | POA: Diagnosis not present

## 2021-03-17 DIAGNOSIS — E870 Hyperosmolality and hypernatremia: Secondary | ICD-10-CM | POA: Diagnosis not present

## 2021-03-17 DIAGNOSIS — Z86711 Personal history of pulmonary embolism: Secondary | ICD-10-CM | POA: Diagnosis not present

## 2021-03-17 DIAGNOSIS — J4521 Mild intermittent asthma with (acute) exacerbation: Secondary | ICD-10-CM | POA: Diagnosis not present

## 2021-03-17 DIAGNOSIS — Z86718 Personal history of other venous thrombosis and embolism: Secondary | ICD-10-CM | POA: Diagnosis not present

## 2021-03-17 DIAGNOSIS — K5730 Diverticulosis of large intestine without perforation or abscess without bleeding: Secondary | ICD-10-CM | POA: Diagnosis not present

## 2021-03-17 DIAGNOSIS — E538 Deficiency of other specified B group vitamins: Secondary | ICD-10-CM | POA: Diagnosis not present

## 2021-03-17 DIAGNOSIS — I482 Chronic atrial fibrillation, unspecified: Secondary | ICD-10-CM | POA: Diagnosis not present

## 2021-03-17 DIAGNOSIS — Z7901 Long term (current) use of anticoagulants: Secondary | ICD-10-CM | POA: Diagnosis not present

## 2021-03-17 DIAGNOSIS — K219 Gastro-esophageal reflux disease without esophagitis: Secondary | ICD-10-CM | POA: Diagnosis not present

## 2021-03-17 DIAGNOSIS — D500 Iron deficiency anemia secondary to blood loss (chronic): Secondary | ICD-10-CM | POA: Diagnosis not present

## 2021-03-17 DIAGNOSIS — M109 Gout, unspecified: Secondary | ICD-10-CM | POA: Diagnosis not present

## 2021-03-17 DIAGNOSIS — D631 Anemia in chronic kidney disease: Secondary | ICD-10-CM | POA: Diagnosis not present

## 2021-03-17 DIAGNOSIS — N189 Chronic kidney disease, unspecified: Secondary | ICD-10-CM | POA: Diagnosis not present

## 2021-03-17 DIAGNOSIS — I130 Hypertensive heart and chronic kidney disease with heart failure and stage 1 through stage 4 chronic kidney disease, or unspecified chronic kidney disease: Secondary | ICD-10-CM | POA: Diagnosis not present

## 2021-03-17 DIAGNOSIS — M5136 Other intervertebral disc degeneration, lumbar region: Secondary | ICD-10-CM | POA: Diagnosis not present

## 2021-03-17 DIAGNOSIS — T8454XD Infection and inflammatory reaction due to internal left knee prosthesis, subsequent encounter: Secondary | ICD-10-CM | POA: Diagnosis not present

## 2021-03-17 DIAGNOSIS — Z7952 Long term (current) use of systemic steroids: Secondary | ICD-10-CM | POA: Diagnosis not present

## 2021-03-17 DIAGNOSIS — D469 Myelodysplastic syndrome, unspecified: Secondary | ICD-10-CM | POA: Diagnosis not present

## 2021-03-17 DIAGNOSIS — M5030 Other cervical disc degeneration, unspecified cervical region: Secondary | ICD-10-CM | POA: Diagnosis not present

## 2021-03-17 DIAGNOSIS — M48061 Spinal stenosis, lumbar region without neurogenic claudication: Secondary | ICD-10-CM | POA: Diagnosis not present

## 2021-03-17 DIAGNOSIS — I509 Heart failure, unspecified: Secondary | ICD-10-CM | POA: Diagnosis not present

## 2021-03-17 DIAGNOSIS — M810 Age-related osteoporosis without current pathological fracture: Secondary | ICD-10-CM | POA: Diagnosis not present

## 2021-03-18 NOTE — Chronic Care Management (AMB) (Signed)
  Chronic Care Management   Outreach Note  03/18/2021 Name: Gloria Mckay MRN: [id number] DOB: Oct 02, 1940  Gloria Mckay is a 80 y.o. year old female who is a primary care patient of Gloria Reeve, Gloria Mckay. I reached out to Gloria Mckay by phone today in response to a referral sent by Gloria Mckay PCP, Gloria Reeve, Gloria Mckay      A second unsuccessful telephone outreach was attempted today. The patient was referred to the case management team for assistance with care management and care coordination.   Follow Up Plan: A HIPAA compliant phone message was left for the patient providing contact information and requesting a return call.  The care management team will reach out to the patient again over the next 7 days.  If patient returns call to provider office, please advise to call [facility]  at [place], [place], [place], [place] [address] Direct Dial: [phone] [email or web address] Website: [email or web address]

## 2021-03-19 ENCOUNTER — Ambulatory Visit (INDEPENDENT_AMBULATORY_CARE_PROVIDER_SITE_OTHER): Payer: Medicare Other | Admitting: Pharmacist

## 2021-03-19 ENCOUNTER — Other Ambulatory Visit: Payer: Self-pay

## 2021-03-19 DIAGNOSIS — I482 Chronic atrial fibrillation, unspecified: Secondary | ICD-10-CM | POA: Diagnosis not present

## 2021-03-19 DIAGNOSIS — T8454XD Infection and inflammatory reaction due to internal left knee prosthesis, subsequent encounter: Secondary | ICD-10-CM | POA: Diagnosis not present

## 2021-03-19 DIAGNOSIS — K219 Gastro-esophageal reflux disease without esophagitis: Secondary | ICD-10-CM | POA: Diagnosis not present

## 2021-03-19 DIAGNOSIS — M5030 Other cervical disc degeneration, unspecified cervical region: Secondary | ICD-10-CM | POA: Diagnosis not present

## 2021-03-19 DIAGNOSIS — M109 Gout, unspecified: Secondary | ICD-10-CM | POA: Diagnosis not present

## 2021-03-19 DIAGNOSIS — E538 Deficiency of other specified B group vitamins: Secondary | ICD-10-CM | POA: Diagnosis not present

## 2021-03-19 DIAGNOSIS — M5136 Other intervertebral disc degeneration, lumbar region: Secondary | ICD-10-CM | POA: Diagnosis not present

## 2021-03-19 DIAGNOSIS — J4521 Mild intermittent asthma with (acute) exacerbation: Secondary | ICD-10-CM | POA: Diagnosis not present

## 2021-03-19 DIAGNOSIS — I509 Heart failure, unspecified: Secondary | ICD-10-CM | POA: Diagnosis not present

## 2021-03-19 DIAGNOSIS — D469 Myelodysplastic syndrome, unspecified: Secondary | ICD-10-CM | POA: Diagnosis not present

## 2021-03-19 DIAGNOSIS — E870 Hyperosmolality and hypernatremia: Secondary | ICD-10-CM | POA: Diagnosis not present

## 2021-03-19 DIAGNOSIS — I130 Hypertensive heart and chronic kidney disease with heart failure and stage 1 through stage 4 chronic kidney disease, or unspecified chronic kidney disease: Secondary | ICD-10-CM | POA: Diagnosis not present

## 2021-03-19 DIAGNOSIS — D631 Anemia in chronic kidney disease: Secondary | ICD-10-CM | POA: Diagnosis not present

## 2021-03-19 DIAGNOSIS — H3530 Unspecified macular degeneration: Secondary | ICD-10-CM | POA: Diagnosis not present

## 2021-03-19 DIAGNOSIS — Z7901 Long term (current) use of anticoagulants: Secondary | ICD-10-CM | POA: Diagnosis not present

## 2021-03-19 DIAGNOSIS — Z7952 Long term (current) use of systemic steroids: Secondary | ICD-10-CM | POA: Diagnosis not present

## 2021-03-19 DIAGNOSIS — M810 Age-related osteoporosis without current pathological fracture: Secondary | ICD-10-CM | POA: Diagnosis not present

## 2021-03-19 DIAGNOSIS — D500 Iron deficiency anemia secondary to blood loss (chronic): Secondary | ICD-10-CM | POA: Diagnosis not present

## 2021-03-19 DIAGNOSIS — N189 Chronic kidney disease, unspecified: Secondary | ICD-10-CM | POA: Diagnosis not present

## 2021-03-19 DIAGNOSIS — Z86718 Personal history of other venous thrombosis and embolism: Secondary | ICD-10-CM | POA: Diagnosis not present

## 2021-03-19 DIAGNOSIS — I4891 Unspecified atrial fibrillation: Secondary | ICD-10-CM

## 2021-03-19 DIAGNOSIS — M48061 Spinal stenosis, lumbar region without neurogenic claudication: Secondary | ICD-10-CM | POA: Diagnosis not present

## 2021-03-19 DIAGNOSIS — K5730 Diverticulosis of large intestine without perforation or abscess without bleeding: Secondary | ICD-10-CM | POA: Diagnosis not present

## 2021-03-19 DIAGNOSIS — D6859 Other primary thrombophilia: Secondary | ICD-10-CM | POA: Diagnosis not present

## 2021-03-19 DIAGNOSIS — Z86711 Personal history of pulmonary embolism: Secondary | ICD-10-CM | POA: Diagnosis not present

## 2021-03-19 LAB — POCT INR: INR: 6.4 — AB (ref 2–3)

## 2021-03-19 NOTE — Progress Notes (Signed)
Subjective:    Gloria Mckay is a 80 y.o. female here for follow-up of chronic anticoagulation for atrial fibrillation and hx DVT . Bleeding signs/symptoms:  None Thromboembolic signs/symptoms:  None  Missed Coumadin doses: None Medication changes: no Dietary changes: yes - less appetite, likely absorbing less with the persistent diarrhea  Bacterial/viral infection: no Other concerns: Patient has run out of home health visits, granddaughter initiated conversations revisiting if warfarin (and INR lability, frequent lab sticks, etc) are still the best option.    Objective:    Current warfarin (COUMADIN) dose: warfarin 5mg  daily Lab Results  Component Value Date   INR 1.6 (A) 03/12/2021   INR 3.3 (A) 03/05/2021   INR 4.3 (A) 02/26/2021   INR 10.7 (HH) 12/01/2020   INR 1.2 (A) 09/04/2020   INR 1.2 (A) 08/21/2020       Assessment:    Supratherapeutic INR for goal of 2-3    Plan:    Dose: HOLD all dosing for now. Increasing by only 2.5mg  (7% total weekly dose, in response to a previous subtherapeutic INR) should not have resulted in INR from 1.6 to 6.4, patient is having significant trouble with diet and diarrhea, visit with GI doctor is pending. Suspect either acute illness or poor nutrition is a culprit. Also will discuss risks & benefits with patient about ongoing, long term anticoagulation plan.  Next INR: Monday, 03/23/21.

## 2021-03-20 ENCOUNTER — Telehealth: Payer: Self-pay

## 2021-03-20 NOTE — Telephone Encounter (Signed)
Tabitha from [facility] called and stated Gloria Mckay's INR was 6.3 today.  Callback [phone]

## 2021-03-22 DIAGNOSIS — Z886 Allergy status to analgesic agent status: Secondary | ICD-10-CM | POA: Diagnosis not present

## 2021-03-22 DIAGNOSIS — Z79899 Other long term (current) drug therapy: Secondary | ICD-10-CM | POA: Diagnosis not present

## 2021-03-22 DIAGNOSIS — R9431 Abnormal electrocardiogram [ECG] [EKG]: Secondary | ICD-10-CM | POA: Diagnosis not present

## 2021-03-22 DIAGNOSIS — I110 Hypertensive heart disease with heart failure: Secondary | ICD-10-CM | POA: Diagnosis not present

## 2021-03-22 DIAGNOSIS — Z880 Allergy status to penicillin: Secondary | ICD-10-CM | POA: Diagnosis not present

## 2021-03-22 DIAGNOSIS — Z91040 Latex allergy status: Secondary | ICD-10-CM | POA: Diagnosis not present

## 2021-03-22 DIAGNOSIS — Z91018 Allergy to other foods: Secondary | ICD-10-CM | POA: Diagnosis not present

## 2021-03-22 DIAGNOSIS — I482 Chronic atrial fibrillation, unspecified: Secondary | ICD-10-CM | POA: Diagnosis not present

## 2021-03-22 DIAGNOSIS — I509 Heart failure, unspecified: Secondary | ICD-10-CM | POA: Diagnosis not present

## 2021-03-22 DIAGNOSIS — N390 Urinary tract infection, site not specified: Secondary | ICD-10-CM | POA: Diagnosis not present

## 2021-03-22 DIAGNOSIS — Z888 Allergy status to other drugs, medicaments and biological substances status: Secondary | ICD-10-CM | POA: Diagnosis not present

## 2021-03-22 LAB — POCT INR: INR: 1.3 — AB (ref 2–3)

## 2021-03-23 ENCOUNTER — Telehealth: Payer: Self-pay

## 2021-03-23 ENCOUNTER — Ambulatory Visit: Payer: Medicare Other

## 2021-03-23 ENCOUNTER — Other Ambulatory Visit: Payer: Self-pay | Admitting: Osteopathic Medicine

## 2021-03-23 NOTE — Progress Notes (Signed)
Faxed paperwork again today.

## 2021-03-23 NOTE — Telephone Encounter (Signed)
Transition Care Management Follow-up Telephone Call Date of discharge and from where: 03/22/2021 from [facility] How have you been since you were released from the hospital? Pt states that she is still struggling some but she has been able to start her abx.   Any questions or concerns? No  Items Reviewed: Did the pt receive and understand the discharge instructions provided? Yes  Medications obtained and verified? Yes  Other? No  Any new allergies since your discharge? No  Dietary orders reviewed? No Do you have support at home? Yes   Functional Questionnaire: (I = Independent and D = Dependent) ADLs: I  Bathing/Dressing- I  Meal Prep- I  Eating- I  Maintaining continence- I  Transferring/Ambulation- I  Managing Meds- I   Follow up appointments reviewed:  PCP Hospital f/u appt confirmed? No   Specialist Hospital f/u appt confirmed? Yes  Scheduled to see Dr. Eilene Ghazi on 04/01/2021 @ 11:20am. Are transportation arrangements needed? No  If their condition worsens, is the pt aware to call PCP or go to the Emergency Dept.? Yes Was the patient provided with contact information for the PCP's office or ED? Yes Was to pt encouraged to call back with questions or concerns? Yes

## 2021-03-24 ENCOUNTER — Other Ambulatory Visit: Payer: Self-pay | Admitting: Osteopathic Medicine

## 2021-03-24 ENCOUNTER — Telehealth: Payer: Self-pay

## 2021-03-24 ENCOUNTER — Encounter: Payer: Self-pay | Admitting: Osteopathic Medicine

## 2021-03-24 MED ORDER — WARFARIN SODIUM 5 MG PO TABS: ORAL_TABLET | ORAL | 0 refills | Status: DC

## 2021-03-24 MED ORDER — HYDROCODONE-ACETAMINOPHEN 5-325 MG PO TABS: 1.0000 | ORAL_TABLET | ORAL | 0 refills | Status: DC | PRN

## 2021-03-24 NOTE — Telephone Encounter (Signed)
Looks like patient was instructed to hold coumadin and recheck INR 03/23/21 Needs INR check ASAP please schedule nurse visit for this if she doesn't have her home INR machine set up yet

## 2021-03-24 NOTE — Telephone Encounter (Signed)
Pt's daughter stopped by the office requesting a refill for pt's coumadin. Pt has been without her coumadin rx for 1 week. She needs for provider to confirm current dosing for pt's coumadin. Pt's daughter has also sent a MyChart message. Pls advise, thanks.

## 2021-03-24 NOTE — Telephone Encounter (Signed)
Called Daughter, [name], and she stated that she was going to wait to see if home health comes but they dont have a machine set up yet. She stated that this week would be inconvenient for them to come for the INR.

## 2021-03-25 ENCOUNTER — Other Ambulatory Visit: Payer: Self-pay

## 2021-03-25 ENCOUNTER — Ambulatory Visit (INDEPENDENT_AMBULATORY_CARE_PROVIDER_SITE_OTHER): Payer: Medicare Other | Admitting: Pharmacist

## 2021-03-25 DIAGNOSIS — Z7901 Long term (current) use of anticoagulants: Secondary | ICD-10-CM

## 2021-03-25 DIAGNOSIS — D6859 Other primary thrombophilia: Secondary | ICD-10-CM

## 2021-03-25 DIAGNOSIS — I4891 Unspecified atrial fibrillation: Secondary | ICD-10-CM | POA: Diagnosis not present

## 2021-03-25 NOTE — Progress Notes (Signed)
Subjective:    Gloria Mckay is a 80 y.o. female here for follow-up of chronic anticoagulation for atrial fibrillation and hx DVT . Bleeding signs/symptoms:  None Thromboembolic signs/symptoms:  None  Missed Coumadin doses: yes, warfarin was on hold intentionally d/t supratherapeutic INR Medication changes: yes - added macrobid for UTI  Dietary changes: no Bacterial/viral infection: yes - UTI Other concerns: family still working on a plan for when home health care finishes, whether to do home INR machine (paperwork pending) or have oncology manage dosing.   Objective:    Current warfarin (COUMADIN) dose: HELD dosing. Resumed 5mg  Tuesday 03/24/21 Lab Results  Component Value Date   INR 6.4 (A) 03/19/2021   INR 1.6 (A) 03/12/2021   INR 3.3 (A) 03/05/2021   INR 10.7 (HH) 12/01/2020   INR 1.2 (A) 09/04/2020   INR 1.2 (A) 08/21/2020       Assessment:    Subtherapeutic INR for goal of 2-3    Plan:    Dose: Resume Warfarin 5mg  x1 on 03/24/21, then 4mg  daily thereafter. Next INR: Friday 03/27/21 with home health

## 2021-03-25 NOTE — Telephone Encounter (Signed)
Routing to [employer] as FYI.Marland KitchenMarland Kitchen

## 2021-03-26 NOTE — Telephone Encounter (Signed)
This encounter was created in error - please disregard.

## 2021-03-27 ENCOUNTER — Ambulatory Visit (INDEPENDENT_AMBULATORY_CARE_PROVIDER_SITE_OTHER): Payer: Medicare Other | Admitting: Pharmacist

## 2021-03-27 ENCOUNTER — Other Ambulatory Visit: Payer: Self-pay

## 2021-03-27 DIAGNOSIS — M5030 Other cervical disc degeneration, unspecified cervical region: Secondary | ICD-10-CM | POA: Diagnosis not present

## 2021-03-27 DIAGNOSIS — M48061 Spinal stenosis, lumbar region without neurogenic claudication: Secondary | ICD-10-CM | POA: Diagnosis not present

## 2021-03-27 DIAGNOSIS — M810 Age-related osteoporosis without current pathological fracture: Secondary | ICD-10-CM | POA: Diagnosis not present

## 2021-03-27 DIAGNOSIS — J4521 Mild intermittent asthma with (acute) exacerbation: Secondary | ICD-10-CM | POA: Diagnosis not present

## 2021-03-27 DIAGNOSIS — I130 Hypertensive heart and chronic kidney disease with heart failure and stage 1 through stage 4 chronic kidney disease, or unspecified chronic kidney disease: Secondary | ICD-10-CM | POA: Diagnosis not present

## 2021-03-27 DIAGNOSIS — I482 Chronic atrial fibrillation, unspecified: Secondary | ICD-10-CM | POA: Diagnosis not present

## 2021-03-27 DIAGNOSIS — M109 Gout, unspecified: Secondary | ICD-10-CM | POA: Diagnosis not present

## 2021-03-27 DIAGNOSIS — I4891 Unspecified atrial fibrillation: Secondary | ICD-10-CM

## 2021-03-27 DIAGNOSIS — K219 Gastro-esophageal reflux disease without esophagitis: Secondary | ICD-10-CM | POA: Diagnosis not present

## 2021-03-27 DIAGNOSIS — T8454XD Infection and inflammatory reaction due to internal left knee prosthesis, subsequent encounter: Secondary | ICD-10-CM | POA: Diagnosis not present

## 2021-03-27 DIAGNOSIS — Z7901 Long term (current) use of anticoagulants: Secondary | ICD-10-CM | POA: Diagnosis not present

## 2021-03-27 DIAGNOSIS — E538 Deficiency of other specified B group vitamins: Secondary | ICD-10-CM | POA: Diagnosis not present

## 2021-03-27 DIAGNOSIS — M5136 Other intervertebral disc degeneration, lumbar region: Secondary | ICD-10-CM | POA: Diagnosis not present

## 2021-03-27 DIAGNOSIS — K5730 Diverticulosis of large intestine without perforation or abscess without bleeding: Secondary | ICD-10-CM | POA: Diagnosis not present

## 2021-03-27 DIAGNOSIS — D6859 Other primary thrombophilia: Secondary | ICD-10-CM | POA: Diagnosis not present

## 2021-03-27 DIAGNOSIS — D469 Myelodysplastic syndrome, unspecified: Secondary | ICD-10-CM | POA: Diagnosis not present

## 2021-03-27 DIAGNOSIS — D631 Anemia in chronic kidney disease: Secondary | ICD-10-CM | POA: Diagnosis not present

## 2021-03-27 DIAGNOSIS — D500 Iron deficiency anemia secondary to blood loss (chronic): Secondary | ICD-10-CM | POA: Diagnosis not present

## 2021-03-27 DIAGNOSIS — Z86711 Personal history of pulmonary embolism: Secondary | ICD-10-CM | POA: Diagnosis not present

## 2021-03-27 DIAGNOSIS — Z7952 Long term (current) use of systemic steroids: Secondary | ICD-10-CM | POA: Diagnosis not present

## 2021-03-27 DIAGNOSIS — H3530 Unspecified macular degeneration: Secondary | ICD-10-CM | POA: Diagnosis not present

## 2021-03-27 DIAGNOSIS — Z86718 Personal history of other venous thrombosis and embolism: Secondary | ICD-10-CM | POA: Diagnosis not present

## 2021-03-27 DIAGNOSIS — N189 Chronic kidney disease, unspecified: Secondary | ICD-10-CM | POA: Diagnosis not present

## 2021-03-27 DIAGNOSIS — E870 Hyperosmolality and hypernatremia: Secondary | ICD-10-CM | POA: Diagnosis not present

## 2021-03-27 DIAGNOSIS — I509 Heart failure, unspecified: Secondary | ICD-10-CM | POA: Diagnosis not present

## 2021-03-27 LAB — POCT INR: INR: 1.2 — AB (ref 2–3)

## 2021-03-27 NOTE — Progress Notes (Signed)
Subjective:    Gloria Mckay is a 80 y.o. female here for follow-up of chronic anticoagulation for atrial fibrillation and hx DVT . Bleeding signs/symptoms:  None Thromboembolic signs/symptoms:  None  Missed Coumadin doses: None Medication changes: no Dietary changes: no Bacterial/viral infection: no Other concerns: no   Objective:    Current warfarin (COUMADIN) dose: Warfarin 5mg  x1 when restarted 03/24/21, then 4mg  daily Lab Results  Component Value Date   INR 1.2 (A) 03/27/2021   INR 1.3 (A) 03/22/2021   INR 6.4 (A) 03/19/2021   INR 10.7 (HH) 12/01/2020   INR 1.2 (A) 09/04/2020   INR 1.2 (A) 08/21/2020       Assessment:    Subtherapeutic INR for goal of 2-3    Plan:    Dose: Increase to 5mg  daily Next INR: 1 week, approx 04/02/21 with home health

## 2021-03-27 NOTE — Chronic Care Management (AMB) (Signed)
  Chronic Care Management   Note  03/27/2021 Name: Elsie Baynes MRN: [id number] DOB: July 22, 1941  Syvanna Ciolino is a 80 y.o. year old female who is a primary care patient of Emeterio Reeve, DO. I reached out to [name] by phone today in response to a referral sent by Ms. Oleta Mouse PCP, Emeterio Reeve, DO      Ms. Gordon was given information about Chronic Care Management services today including:  CCM service includes personalized support from designated clinical staff supervised by her physician, including individualized plan of care and coordination with other care providers 24/7 contact phone numbers for assistance for urgent and routine care needs. Service will only be billed when office clinical staff spend 20 minutes or more in a month to coordinate care. Only one practitioner may furnish and bill the service in a calendar month. The patient may stop CCM services at any time (effective at the end of the month) by phone call to the office staff. The patient will be responsible for cost sharing (co-pay) of up to 20% of the service fee (after annual deductible is met).  Patient did not agree to enrollment in care management services and does not wish to consider at this time.  Follow up plan: Patient declines engagement by the care management team. Appropriate care team members and provider have been notified via electronic communication.   Noreene Larsson, [place], [place], [place] [address] Direct Dial: [phone] [email or web address] Website: [email or web address]

## 2021-04-01 DIAGNOSIS — M5030 Other cervical disc degeneration, unspecified cervical region: Secondary | ICD-10-CM | POA: Diagnosis not present

## 2021-04-01 DIAGNOSIS — Z86711 Personal history of pulmonary embolism: Secondary | ICD-10-CM | POA: Diagnosis not present

## 2021-04-01 DIAGNOSIS — Z86718 Personal history of other venous thrombosis and embolism: Secondary | ICD-10-CM | POA: Diagnosis not present

## 2021-04-01 DIAGNOSIS — K5730 Diverticulosis of large intestine without perforation or abscess without bleeding: Secondary | ICD-10-CM | POA: Diagnosis not present

## 2021-04-01 DIAGNOSIS — T8459XD Infection and inflammatory reaction due to other internal joint prosthesis, subsequent encounter: Secondary | ICD-10-CM | POA: Diagnosis not present

## 2021-04-01 DIAGNOSIS — D500 Iron deficiency anemia secondary to blood loss (chronic): Secondary | ICD-10-CM | POA: Diagnosis not present

## 2021-04-01 DIAGNOSIS — D469 Myelodysplastic syndrome, unspecified: Secondary | ICD-10-CM | POA: Diagnosis not present

## 2021-04-01 DIAGNOSIS — B957 Other staphylococcus as the cause of diseases classified elsewhere: Secondary | ICD-10-CM | POA: Diagnosis not present

## 2021-04-01 DIAGNOSIS — T8454XD Infection and inflammatory reaction due to internal left knee prosthesis, subsequent encounter: Secondary | ICD-10-CM | POA: Diagnosis not present

## 2021-04-01 DIAGNOSIS — J4521 Mild intermittent asthma with (acute) exacerbation: Secondary | ICD-10-CM | POA: Diagnosis not present

## 2021-04-01 DIAGNOSIS — M5136 Other intervertebral disc degeneration, lumbar region: Secondary | ICD-10-CM | POA: Diagnosis not present

## 2021-04-01 DIAGNOSIS — I509 Heart failure, unspecified: Secondary | ICD-10-CM | POA: Diagnosis not present

## 2021-04-01 DIAGNOSIS — K219 Gastro-esophageal reflux disease without esophagitis: Secondary | ICD-10-CM | POA: Diagnosis not present

## 2021-04-01 DIAGNOSIS — N189 Chronic kidney disease, unspecified: Secondary | ICD-10-CM | POA: Diagnosis not present

## 2021-04-01 DIAGNOSIS — I482 Chronic atrial fibrillation, unspecified: Secondary | ICD-10-CM | POA: Diagnosis not present

## 2021-04-01 DIAGNOSIS — M109 Gout, unspecified: Secondary | ICD-10-CM | POA: Diagnosis not present

## 2021-04-01 DIAGNOSIS — E538 Deficiency of other specified B group vitamins: Secondary | ICD-10-CM | POA: Diagnosis not present

## 2021-04-01 DIAGNOSIS — Z7952 Long term (current) use of systemic steroids: Secondary | ICD-10-CM | POA: Diagnosis not present

## 2021-04-01 DIAGNOSIS — M48061 Spinal stenosis, lumbar region without neurogenic claudication: Secondary | ICD-10-CM | POA: Diagnosis not present

## 2021-04-01 DIAGNOSIS — Z7901 Long term (current) use of anticoagulants: Secondary | ICD-10-CM | POA: Diagnosis not present

## 2021-04-01 DIAGNOSIS — H3530 Unspecified macular degeneration: Secondary | ICD-10-CM | POA: Diagnosis not present

## 2021-04-01 DIAGNOSIS — Z96659 Presence of unspecified artificial knee joint: Secondary | ICD-10-CM | POA: Diagnosis not present

## 2021-04-01 DIAGNOSIS — M810 Age-related osteoporosis without current pathological fracture: Secondary | ICD-10-CM | POA: Diagnosis not present

## 2021-04-01 DIAGNOSIS — M7989 Other specified soft tissue disorders: Secondary | ICD-10-CM | POA: Diagnosis not present

## 2021-04-01 DIAGNOSIS — N1832 Chronic kidney disease, stage 3b: Secondary | ICD-10-CM | POA: Diagnosis not present

## 2021-04-01 DIAGNOSIS — E870 Hyperosmolality and hypernatremia: Secondary | ICD-10-CM | POA: Diagnosis not present

## 2021-04-01 DIAGNOSIS — I130 Hypertensive heart and chronic kidney disease with heart failure and stage 1 through stage 4 chronic kidney disease, or unspecified chronic kidney disease: Secondary | ICD-10-CM | POA: Diagnosis not present

## 2021-04-01 DIAGNOSIS — D803 Selective deficiency of immunoglobulin G [IgG] subclasses: Secondary | ICD-10-CM | POA: Diagnosis not present

## 2021-04-01 DIAGNOSIS — D631 Anemia in chronic kidney disease: Secondary | ICD-10-CM | POA: Diagnosis not present

## 2021-04-01 LAB — POCT INR: INR: 1.5 — AB (ref 2–3)

## 2021-04-03 ENCOUNTER — Ambulatory Visit (INDEPENDENT_AMBULATORY_CARE_PROVIDER_SITE_OTHER): Payer: Medicare Other | Admitting: Pharmacist

## 2021-04-03 DIAGNOSIS — D6859 Other primary thrombophilia: Secondary | ICD-10-CM

## 2021-04-03 DIAGNOSIS — Z7901 Long term (current) use of anticoagulants: Secondary | ICD-10-CM

## 2021-04-03 DIAGNOSIS — I4891 Unspecified atrial fibrillation: Secondary | ICD-10-CM | POA: Diagnosis not present

## 2021-04-04 NOTE — Progress Notes (Signed)
Subjective:    Gloria Mckay is a 80 y.o. female here for follow-up of chronic anticoagulation for atrial fibrillation and hx DVT . Bleeding signs/symptoms:  None Thromboembolic signs/symptoms:  None  Missed Coumadin doses: None Medication changes: no Dietary changes: no Bacterial/viral infection: no Other concerns: no   Objective:    Current warfarin (COUMADIN) dose: warfarin 5mg  daily Lab Results  Component Value Date   INR 1.5 (A) 04/01/2021   INR 1.2 (A) 03/27/2021   INR 1.3 (A) 03/22/2021   INR 10.7 (HH) 12/01/2020   INR 1.2 (A) 09/04/2020   INR 1.2 (A) 08/21/2020       Assessment:    Subtherapeutic INR for goal of 2-3    Plan:    Dose: Adjust to 7.5mg  x1 on Friday 04/03/21, then next week fall into pattern of 5mg  daily, except 7.5mg  on Tuesdays & Thursdays. Next INR: 1 week on 04/09/21

## 2021-04-09 ENCOUNTER — Encounter (HOSPITAL_BASED_OUTPATIENT_CLINIC_OR_DEPARTMENT_OTHER): Payer: Self-pay | Admitting: Nurse Practitioner

## 2021-04-09 ENCOUNTER — Ambulatory Visit (INDEPENDENT_AMBULATORY_CARE_PROVIDER_SITE_OTHER): Payer: Medicare Other | Admitting: Pharmacist

## 2021-04-09 ENCOUNTER — Other Ambulatory Visit: Payer: Self-pay

## 2021-04-09 ENCOUNTER — Encounter: Payer: Self-pay | Admitting: Osteopathic Medicine

## 2021-04-09 ENCOUNTER — Telehealth: Payer: Self-pay

## 2021-04-09 DIAGNOSIS — D6859 Other primary thrombophilia: Secondary | ICD-10-CM | POA: Diagnosis not present

## 2021-04-09 DIAGNOSIS — Z7901 Long term (current) use of anticoagulants: Secondary | ICD-10-CM | POA: Diagnosis not present

## 2021-04-09 DIAGNOSIS — M5136 Other intervertebral disc degeneration, lumbar region: Secondary | ICD-10-CM | POA: Diagnosis not present

## 2021-04-09 DIAGNOSIS — D631 Anemia in chronic kidney disease: Secondary | ICD-10-CM | POA: Diagnosis not present

## 2021-04-09 DIAGNOSIS — I509 Heart failure, unspecified: Secondary | ICD-10-CM | POA: Diagnosis not present

## 2021-04-09 DIAGNOSIS — M48061 Spinal stenosis, lumbar region without neurogenic claudication: Secondary | ICD-10-CM | POA: Diagnosis not present

## 2021-04-09 DIAGNOSIS — K5730 Diverticulosis of large intestine without perforation or abscess without bleeding: Secondary | ICD-10-CM | POA: Diagnosis not present

## 2021-04-09 DIAGNOSIS — J4521 Mild intermittent asthma with (acute) exacerbation: Secondary | ICD-10-CM | POA: Diagnosis not present

## 2021-04-09 DIAGNOSIS — M5030 Other cervical disc degeneration, unspecified cervical region: Secondary | ICD-10-CM | POA: Diagnosis not present

## 2021-04-09 DIAGNOSIS — T8454XD Infection and inflammatory reaction due to internal left knee prosthesis, subsequent encounter: Secondary | ICD-10-CM | POA: Diagnosis not present

## 2021-04-09 DIAGNOSIS — Z7952 Long term (current) use of systemic steroids: Secondary | ICD-10-CM | POA: Diagnosis not present

## 2021-04-09 DIAGNOSIS — M810 Age-related osteoporosis without current pathological fracture: Secondary | ICD-10-CM | POA: Diagnosis not present

## 2021-04-09 DIAGNOSIS — K219 Gastro-esophageal reflux disease without esophagitis: Secondary | ICD-10-CM | POA: Diagnosis not present

## 2021-04-09 DIAGNOSIS — D500 Iron deficiency anemia secondary to blood loss (chronic): Secondary | ICD-10-CM | POA: Diagnosis not present

## 2021-04-09 DIAGNOSIS — I130 Hypertensive heart and chronic kidney disease with heart failure and stage 1 through stage 4 chronic kidney disease, or unspecified chronic kidney disease: Secondary | ICD-10-CM | POA: Diagnosis not present

## 2021-04-09 DIAGNOSIS — I4891 Unspecified atrial fibrillation: Secondary | ICD-10-CM

## 2021-04-09 DIAGNOSIS — E538 Deficiency of other specified B group vitamins: Secondary | ICD-10-CM | POA: Diagnosis not present

## 2021-04-09 DIAGNOSIS — E870 Hyperosmolality and hypernatremia: Secondary | ICD-10-CM | POA: Diagnosis not present

## 2021-04-09 DIAGNOSIS — Z86711 Personal history of pulmonary embolism: Secondary | ICD-10-CM | POA: Diagnosis not present

## 2021-04-09 DIAGNOSIS — H3530 Unspecified macular degeneration: Secondary | ICD-10-CM | POA: Diagnosis not present

## 2021-04-09 DIAGNOSIS — D469 Myelodysplastic syndrome, unspecified: Secondary | ICD-10-CM | POA: Diagnosis not present

## 2021-04-09 DIAGNOSIS — I482 Chronic atrial fibrillation, unspecified: Secondary | ICD-10-CM | POA: Diagnosis not present

## 2021-04-09 DIAGNOSIS — Z86718 Personal history of other venous thrombosis and embolism: Secondary | ICD-10-CM | POA: Diagnosis not present

## 2021-04-09 DIAGNOSIS — N189 Chronic kidney disease, unspecified: Secondary | ICD-10-CM | POA: Diagnosis not present

## 2021-04-09 DIAGNOSIS — M109 Gout, unspecified: Secondary | ICD-10-CM | POA: Diagnosis not present

## 2021-04-09 LAB — POCT INR: INR: 1.7 — AB (ref 2–3)

## 2021-04-09 MED ORDER — CARVEDILOL 25 MG PO TABS: 25.0000 mg | ORAL_TABLET | Freq: Two times a day (BID) | ORAL | 0 refills | Status: DC

## 2021-04-09 MED ORDER — PAXLOVID 10 X 150 MG & 10 X 100MG PO TBPK: ORAL_TABLET | ORAL | 0 refills | Status: DC

## 2021-04-09 NOTE — Telephone Encounter (Signed)
[name], Joycelyn's daughter, called and left a message stating Gloria Mckay started getting sick and has tested positive for Covid. She states Dr Zigmund Daniel has sent in Paxlovid for her and her husband because the also tested positive for Covid. She was wanting Dr Sheppard Coil to send in [place] for her mom. Please advise.

## 2021-04-09 NOTE — Telephone Encounter (Signed)
Patient's daughter advised.  

## 2021-04-09 NOTE — Progress Notes (Signed)
Subjective:    Gloria Mckay is a 80 y.o. female here for follow-up of chronic anticoagulation for atrial fibrillation and hx DVT . Bleeding signs/symptoms:  None Thromboembolic signs/symptoms:  None  Missed Coumadin doses: This week - missed 5mg  x1 on Wednesday 04/09/21 Medication changes: no Dietary changes: no Bacterial/viral infection: yes - covid positive 04/09/21 Other concerns: no    Objective:    Current warfarin (COUMADIN) dose: warfarin 5mg  daily except 7.5mg  on Tuesdays and Thursdays Lab Results  Component Value Date   INR 1.5 (A) 04/01/2021   INR 1.2 (A) 03/27/2021   INR 1.3 (A) 03/22/2021   INR 10.7 (HH) 12/01/2020   INR 1.2 (A) 09/04/2020   INR 1.2 (A) 08/21/2020       Assessment:    Subtherapeutic INR for goal of 2-3    Plan:    Dose: no change Next INR: ideally 1 week, TBD pending home health ability to return after patient is cleared due to covid

## 2021-04-09 NOTE — Telephone Encounter (Signed)
Will sent antivirals but from what I can see patient isn't vaccinated and is otherwise high risk, so will absolutely need int person eval in urgent care or ER if feeling severely ill  MyCHart message sent also

## 2021-04-10 ENCOUNTER — Other Ambulatory Visit: Payer: Self-pay | Admitting: Osteopathic Medicine

## 2021-04-10 NOTE — Telephone Encounter (Signed)
[facility] requesting new rx.

## 2021-04-16 DIAGNOSIS — M109 Gout, unspecified: Secondary | ICD-10-CM | POA: Diagnosis not present

## 2021-04-16 DIAGNOSIS — D469 Myelodysplastic syndrome, unspecified: Secondary | ICD-10-CM | POA: Diagnosis not present

## 2021-04-16 DIAGNOSIS — I130 Hypertensive heart and chronic kidney disease with heart failure and stage 1 through stage 4 chronic kidney disease, or unspecified chronic kidney disease: Secondary | ICD-10-CM | POA: Diagnosis not present

## 2021-04-16 DIAGNOSIS — E538 Deficiency of other specified B group vitamins: Secondary | ICD-10-CM | POA: Diagnosis not present

## 2021-04-16 DIAGNOSIS — K5730 Diverticulosis of large intestine without perforation or abscess without bleeding: Secondary | ICD-10-CM | POA: Diagnosis not present

## 2021-04-16 DIAGNOSIS — N189 Chronic kidney disease, unspecified: Secondary | ICD-10-CM | POA: Diagnosis not present

## 2021-04-16 DIAGNOSIS — J4521 Mild intermittent asthma with (acute) exacerbation: Secondary | ICD-10-CM | POA: Diagnosis not present

## 2021-04-16 DIAGNOSIS — I509 Heart failure, unspecified: Secondary | ICD-10-CM | POA: Diagnosis not present

## 2021-04-16 DIAGNOSIS — Z7952 Long term (current) use of systemic steroids: Secondary | ICD-10-CM | POA: Diagnosis not present

## 2021-04-16 DIAGNOSIS — Z7901 Long term (current) use of anticoagulants: Secondary | ICD-10-CM | POA: Diagnosis not present

## 2021-04-16 DIAGNOSIS — M5030 Other cervical disc degeneration, unspecified cervical region: Secondary | ICD-10-CM | POA: Diagnosis not present

## 2021-04-16 DIAGNOSIS — K219 Gastro-esophageal reflux disease without esophagitis: Secondary | ICD-10-CM | POA: Diagnosis not present

## 2021-04-16 DIAGNOSIS — E870 Hyperosmolality and hypernatremia: Secondary | ICD-10-CM | POA: Diagnosis not present

## 2021-04-16 DIAGNOSIS — M5136 Other intervertebral disc degeneration, lumbar region: Secondary | ICD-10-CM | POA: Diagnosis not present

## 2021-04-16 DIAGNOSIS — I482 Chronic atrial fibrillation, unspecified: Secondary | ICD-10-CM | POA: Diagnosis not present

## 2021-04-16 DIAGNOSIS — M48061 Spinal stenosis, lumbar region without neurogenic claudication: Secondary | ICD-10-CM | POA: Diagnosis not present

## 2021-04-16 DIAGNOSIS — D500 Iron deficiency anemia secondary to blood loss (chronic): Secondary | ICD-10-CM | POA: Diagnosis not present

## 2021-04-16 DIAGNOSIS — T8454XD Infection and inflammatory reaction due to internal left knee prosthesis, subsequent encounter: Secondary | ICD-10-CM | POA: Diagnosis not present

## 2021-04-16 DIAGNOSIS — Z86718 Personal history of other venous thrombosis and embolism: Secondary | ICD-10-CM | POA: Diagnosis not present

## 2021-04-16 DIAGNOSIS — Z86711 Personal history of pulmonary embolism: Secondary | ICD-10-CM | POA: Diagnosis not present

## 2021-04-16 DIAGNOSIS — M810 Age-related osteoporosis without current pathological fracture: Secondary | ICD-10-CM | POA: Diagnosis not present

## 2021-04-16 DIAGNOSIS — H3530 Unspecified macular degeneration: Secondary | ICD-10-CM | POA: Diagnosis not present

## 2021-04-16 DIAGNOSIS — D631 Anemia in chronic kidney disease: Secondary | ICD-10-CM | POA: Diagnosis not present

## 2021-04-16 LAB — POCT INR: INR: 4.2 — AB (ref 2–3)

## 2021-04-17 ENCOUNTER — Other Ambulatory Visit: Payer: Self-pay

## 2021-04-17 ENCOUNTER — Ambulatory Visit (INDEPENDENT_AMBULATORY_CARE_PROVIDER_SITE_OTHER): Payer: Medicare Other | Admitting: Pharmacist

## 2021-04-17 DIAGNOSIS — K219 Gastro-esophageal reflux disease without esophagitis: Secondary | ICD-10-CM | POA: Diagnosis not present

## 2021-04-17 DIAGNOSIS — I4891 Unspecified atrial fibrillation: Secondary | ICD-10-CM | POA: Diagnosis not present

## 2021-04-17 DIAGNOSIS — Z66 Do not resuscitate: Secondary | ICD-10-CM | POA: Diagnosis not present

## 2021-04-17 DIAGNOSIS — D6859 Other primary thrombophilia: Secondary | ICD-10-CM

## 2021-04-17 DIAGNOSIS — E872 Acidosis: Secondary | ICD-10-CM | POA: Diagnosis not present

## 2021-04-17 DIAGNOSIS — G8929 Other chronic pain: Secondary | ICD-10-CM | POA: Diagnosis not present

## 2021-04-17 DIAGNOSIS — M5030 Other cervical disc degeneration, unspecified cervical region: Secondary | ICD-10-CM | POA: Diagnosis not present

## 2021-04-17 DIAGNOSIS — D472 Monoclonal gammopathy: Secondary | ICD-10-CM | POA: Diagnosis not present

## 2021-04-17 DIAGNOSIS — E1122 Type 2 diabetes mellitus with diabetic chronic kidney disease: Secondary | ICD-10-CM | POA: Diagnosis not present

## 2021-04-17 DIAGNOSIS — M26629 Arthralgia of temporomandibular joint, unspecified side: Secondary | ICD-10-CM | POA: Diagnosis not present

## 2021-04-17 DIAGNOSIS — I6782 Cerebral ischemia: Secondary | ICD-10-CM | POA: Diagnosis not present

## 2021-04-17 DIAGNOSIS — R2681 Unsteadiness on feet: Secondary | ICD-10-CM | POA: Diagnosis not present

## 2021-04-17 DIAGNOSIS — Z743 Need for continuous supervision: Secondary | ICD-10-CM | POA: Diagnosis not present

## 2021-04-17 DIAGNOSIS — D539 Nutritional anemia, unspecified: Secondary | ICD-10-CM | POA: Diagnosis not present

## 2021-04-17 DIAGNOSIS — Z28310 Unvaccinated for covid-19: Secondary | ICD-10-CM | POA: Diagnosis not present

## 2021-04-17 DIAGNOSIS — I10 Essential (primary) hypertension: Secondary | ICD-10-CM | POA: Diagnosis not present

## 2021-04-17 DIAGNOSIS — G9341 Metabolic encephalopathy: Secondary | ICD-10-CM | POA: Diagnosis not present

## 2021-04-17 DIAGNOSIS — Z86711 Personal history of pulmonary embolism: Secondary | ICD-10-CM | POA: Diagnosis not present

## 2021-04-17 DIAGNOSIS — M85851 Other specified disorders of bone density and structure, right thigh: Secondary | ICD-10-CM | POA: Diagnosis not present

## 2021-04-17 DIAGNOSIS — M79631 Pain in right forearm: Secondary | ICD-10-CM | POA: Diagnosis not present

## 2021-04-17 DIAGNOSIS — Z7901 Long term (current) use of anticoagulants: Secondary | ICD-10-CM

## 2021-04-17 DIAGNOSIS — N1832 Chronic kidney disease, stage 3b: Secondary | ICD-10-CM | POA: Diagnosis not present

## 2021-04-17 DIAGNOSIS — Q141 Congenital malformation of retina: Secondary | ICD-10-CM | POA: Diagnosis not present

## 2021-04-17 DIAGNOSIS — R531 Weakness: Secondary | ICD-10-CM | POA: Diagnosis not present

## 2021-04-17 DIAGNOSIS — E538 Deficiency of other specified B group vitamins: Secondary | ICD-10-CM | POA: Diagnosis not present

## 2021-04-17 DIAGNOSIS — D469 Myelodysplastic syndrome, unspecified: Secondary | ICD-10-CM | POA: Diagnosis not present

## 2021-04-17 DIAGNOSIS — G319 Degenerative disease of nervous system, unspecified: Secondary | ICD-10-CM | POA: Diagnosis not present

## 2021-04-17 DIAGNOSIS — I130 Hypertensive heart and chronic kidney disease with heart failure and stage 1 through stage 4 chronic kidney disease, or unspecified chronic kidney disease: Secondary | ICD-10-CM | POA: Diagnosis not present

## 2021-04-17 DIAGNOSIS — D638 Anemia in other chronic diseases classified elsewhere: Secondary | ICD-10-CM | POA: Diagnosis not present

## 2021-04-17 DIAGNOSIS — Z903 Acquired absence of stomach [part of]: Secondary | ICD-10-CM | POA: Diagnosis not present

## 2021-04-17 DIAGNOSIS — N184 Chronic kidney disease, stage 4 (severe): Secondary | ICD-10-CM | POA: Diagnosis not present

## 2021-04-17 DIAGNOSIS — R6889 Other general symptoms and signs: Secondary | ICD-10-CM | POA: Diagnosis not present

## 2021-04-17 DIAGNOSIS — N390 Urinary tract infection, site not specified: Secondary | ICD-10-CM | POA: Diagnosis not present

## 2021-04-17 DIAGNOSIS — D803 Selective deficiency of immunoglobulin G [IgG] subclasses: Secondary | ICD-10-CM | POA: Diagnosis not present

## 2021-04-17 DIAGNOSIS — N178 Other acute kidney failure: Secondary | ICD-10-CM | POA: Diagnosis not present

## 2021-04-17 DIAGNOSIS — M48061 Spinal stenosis, lumbar region without neurogenic claudication: Secondary | ICD-10-CM | POA: Diagnosis not present

## 2021-04-17 DIAGNOSIS — R0602 Shortness of breath: Secondary | ICD-10-CM | POA: Diagnosis not present

## 2021-04-17 DIAGNOSIS — M810 Age-related osteoporosis without current pathological fracture: Secondary | ICD-10-CM | POA: Diagnosis not present

## 2021-04-17 DIAGNOSIS — E870 Hyperosmolality and hypernatremia: Secondary | ICD-10-CM | POA: Diagnosis not present

## 2021-04-17 DIAGNOSIS — R059 Cough, unspecified: Secondary | ICD-10-CM | POA: Diagnosis not present

## 2021-04-17 DIAGNOSIS — Z681 Body mass index (BMI) 19 or less, adult: Secondary | ICD-10-CM | POA: Diagnosis not present

## 2021-04-17 DIAGNOSIS — M5136 Other intervertebral disc degeneration, lumbar region: Secondary | ICD-10-CM | POA: Diagnosis not present

## 2021-04-17 DIAGNOSIS — N179 Acute kidney failure, unspecified: Secondary | ICD-10-CM | POA: Diagnosis not present

## 2021-04-17 DIAGNOSIS — Z8614 Personal history of Methicillin resistant Staphylococcus aureus infection: Secondary | ICD-10-CM | POA: Diagnosis not present

## 2021-04-17 DIAGNOSIS — K529 Noninfective gastroenteritis and colitis, unspecified: Secondary | ICD-10-CM | POA: Diagnosis not present

## 2021-04-17 DIAGNOSIS — N3000 Acute cystitis without hematuria: Secondary | ICD-10-CM | POA: Diagnosis not present

## 2021-04-17 DIAGNOSIS — Z96659 Presence of unspecified artificial knee joint: Secondary | ICD-10-CM | POA: Diagnosis not present

## 2021-04-17 DIAGNOSIS — M6281 Muscle weakness (generalized): Secondary | ICD-10-CM | POA: Diagnosis not present

## 2021-04-17 DIAGNOSIS — H3530 Unspecified macular degeneration: Secondary | ICD-10-CM | POA: Diagnosis not present

## 2021-04-17 DIAGNOSIS — E559 Vitamin D deficiency, unspecified: Secondary | ICD-10-CM | POA: Diagnosis not present

## 2021-04-17 DIAGNOSIS — R627 Adult failure to thrive: Secondary | ICD-10-CM | POA: Diagnosis not present

## 2021-04-17 DIAGNOSIS — U071 COVID-19: Secondary | ICD-10-CM | POA: Diagnosis not present

## 2021-04-17 DIAGNOSIS — M79621 Pain in right upper arm: Secondary | ICD-10-CM | POA: Diagnosis not present

## 2021-04-17 NOTE — Progress Notes (Signed)
Subjective:    Gloria Mckay is a 80 y.o. female here for follow-up of chronic anticoagulation for atrial fibrillation and hx DVT . Bleeding signs/symptoms:  None Thromboembolic signs/symptoms:  None  Missed Coumadin doses: None Medication changes: no Dietary changes: no Bacterial/viral infection: yes - covid Other concerns: no   Objective:    Current warfarin (COUMADIN) dose: warfarin 5mg  daily except 7.5mg  on Tuesdays and Thursdays Lab Results  Component Value Date   INR 4.2 (A) 04/16/2021   INR 1.7 (A) 04/09/2021   INR 1.5 (A) 04/01/2021   INR 10.7 (HH) 12/01/2020   INR 1.2 (A) 09/04/2020   INR 1.2 (A) 08/21/2020       Assessment:    Supratherapeutic INR for goal of 2-3    Plan:    Dose: HOLD ALL DOSES. Anticipate INR to rise with covid infection and current poor clinical status. Next INR: Monday or Tuesday, 8/22 or 8/23 with home health.

## 2021-04-18 DIAGNOSIS — K529 Noninfective gastroenteritis and colitis, unspecified: Secondary | ICD-10-CM | POA: Diagnosis not present

## 2021-04-18 DIAGNOSIS — U071 COVID-19: Secondary | ICD-10-CM | POA: Diagnosis not present

## 2021-04-21 DIAGNOSIS — N3000 Acute cystitis without hematuria: Secondary | ICD-10-CM | POA: Diagnosis not present

## 2021-04-22 DIAGNOSIS — N3000 Acute cystitis without hematuria: Secondary | ICD-10-CM | POA: Diagnosis not present

## 2021-04-24 ENCOUNTER — Other Ambulatory Visit: Payer: Self-pay | Admitting: Osteopathic Medicine

## 2021-04-25 DIAGNOSIS — D638 Anemia in other chronic diseases classified elsewhere: Secondary | ICD-10-CM | POA: Diagnosis not present

## 2021-04-25 DIAGNOSIS — I4891 Unspecified atrial fibrillation: Secondary | ICD-10-CM | POA: Diagnosis not present

## 2021-04-25 DIAGNOSIS — N3000 Acute cystitis without hematuria: Secondary | ICD-10-CM | POA: Diagnosis not present

## 2021-04-26 DIAGNOSIS — D638 Anemia in other chronic diseases classified elsewhere: Secondary | ICD-10-CM | POA: Diagnosis not present

## 2021-04-26 DIAGNOSIS — N3000 Acute cystitis without hematuria: Secondary | ICD-10-CM | POA: Diagnosis not present

## 2021-04-26 DIAGNOSIS — I4891 Unspecified atrial fibrillation: Secondary | ICD-10-CM | POA: Diagnosis not present

## 2021-04-27 DIAGNOSIS — N3000 Acute cystitis without hematuria: Secondary | ICD-10-CM | POA: Diagnosis not present

## 2021-04-28 DIAGNOSIS — I4891 Unspecified atrial fibrillation: Secondary | ICD-10-CM | POA: Diagnosis not present

## 2021-04-28 DIAGNOSIS — D638 Anemia in other chronic diseases classified elsewhere: Secondary | ICD-10-CM | POA: Diagnosis not present

## 2021-04-28 DIAGNOSIS — N3000 Acute cystitis without hematuria: Secondary | ICD-10-CM | POA: Diagnosis not present

## 2021-04-29 DIAGNOSIS — Q141 Congenital malformation of retina: Secondary | ICD-10-CM | POA: Diagnosis not present

## 2021-04-29 DIAGNOSIS — D539 Nutritional anemia, unspecified: Secondary | ICD-10-CM | POA: Diagnosis not present

## 2021-04-29 DIAGNOSIS — N390 Urinary tract infection, site not specified: Secondary | ICD-10-CM | POA: Diagnosis not present

## 2021-04-29 DIAGNOSIS — N179 Acute kidney failure, unspecified: Secondary | ICD-10-CM | POA: Diagnosis not present

## 2021-04-29 DIAGNOSIS — E559 Vitamin D deficiency, unspecified: Secondary | ICD-10-CM | POA: Diagnosis not present

## 2021-04-29 DIAGNOSIS — I10 Essential (primary) hypertension: Secondary | ICD-10-CM | POA: Diagnosis not present

## 2021-04-29 DIAGNOSIS — D803 Selective deficiency of immunoglobulin G [IgG] subclasses: Secondary | ICD-10-CM | POA: Diagnosis not present

## 2021-04-29 DIAGNOSIS — I4891 Unspecified atrial fibrillation: Secondary | ICD-10-CM | POA: Diagnosis not present

## 2021-04-29 DIAGNOSIS — N189 Chronic kidney disease, unspecified: Secondary | ICD-10-CM | POA: Diagnosis not present

## 2021-04-29 DIAGNOSIS — Z903 Acquired absence of stomach [part of]: Secondary | ICD-10-CM | POA: Diagnosis not present

## 2021-04-29 DIAGNOSIS — K219 Gastro-esophageal reflux disease without esophagitis: Secondary | ICD-10-CM | POA: Diagnosis not present

## 2021-04-29 DIAGNOSIS — E870 Hyperosmolality and hypernatremia: Secondary | ICD-10-CM | POA: Diagnosis not present

## 2021-04-29 DIAGNOSIS — Z8614 Personal history of Methicillin resistant Staphylococcus aureus infection: Secondary | ICD-10-CM | POA: Diagnosis not present

## 2021-04-29 DIAGNOSIS — Z743 Need for continuous supervision: Secondary | ICD-10-CM | POA: Diagnosis not present

## 2021-04-29 DIAGNOSIS — M6281 Muscle weakness (generalized): Secondary | ICD-10-CM | POA: Diagnosis not present

## 2021-04-29 DIAGNOSIS — N3000 Acute cystitis without hematuria: Secondary | ICD-10-CM | POA: Diagnosis not present

## 2021-04-29 DIAGNOSIS — M810 Age-related osteoporosis without current pathological fracture: Secondary | ICD-10-CM | POA: Diagnosis not present

## 2021-04-29 DIAGNOSIS — E538 Deficiency of other specified B group vitamins: Secondary | ICD-10-CM | POA: Diagnosis not present

## 2021-04-29 DIAGNOSIS — N1832 Chronic kidney disease, stage 3b: Secondary | ICD-10-CM | POA: Diagnosis not present

## 2021-04-29 DIAGNOSIS — H3530 Unspecified macular degeneration: Secondary | ICD-10-CM | POA: Diagnosis not present

## 2021-04-29 DIAGNOSIS — M48061 Spinal stenosis, lumbar region without neurogenic claudication: Secondary | ICD-10-CM | POA: Diagnosis not present

## 2021-04-29 DIAGNOSIS — R531 Weakness: Secondary | ICD-10-CM | POA: Diagnosis not present

## 2021-04-29 DIAGNOSIS — D638 Anemia in other chronic diseases classified elsewhere: Secondary | ICD-10-CM | POA: Diagnosis not present

## 2021-04-29 DIAGNOSIS — D472 Monoclonal gammopathy: Secondary | ICD-10-CM | POA: Diagnosis not present

## 2021-04-29 DIAGNOSIS — Z96659 Presence of unspecified artificial knee joint: Secondary | ICD-10-CM | POA: Diagnosis not present

## 2021-04-29 DIAGNOSIS — Z7901 Long term (current) use of anticoagulants: Secondary | ICD-10-CM | POA: Diagnosis not present

## 2021-04-29 DIAGNOSIS — R6889 Other general symptoms and signs: Secondary | ICD-10-CM | POA: Diagnosis not present

## 2021-04-29 DIAGNOSIS — R2681 Unsteadiness on feet: Secondary | ICD-10-CM | POA: Diagnosis not present

## 2021-04-29 DIAGNOSIS — R519 Headache, unspecified: Secondary | ICD-10-CM | POA: Diagnosis not present

## 2021-04-29 DIAGNOSIS — Z86711 Personal history of pulmonary embolism: Secondary | ICD-10-CM | POA: Diagnosis not present

## 2021-04-29 DIAGNOSIS — M1990 Unspecified osteoarthritis, unspecified site: Secondary | ICD-10-CM | POA: Diagnosis not present

## 2021-04-30 DIAGNOSIS — I10 Essential (primary) hypertension: Secondary | ICD-10-CM | POA: Diagnosis not present

## 2021-05-06 DIAGNOSIS — I4891 Unspecified atrial fibrillation: Secondary | ICD-10-CM | POA: Diagnosis not present

## 2021-05-07 DIAGNOSIS — I10 Essential (primary) hypertension: Secondary | ICD-10-CM | POA: Diagnosis not present

## 2021-05-08 DIAGNOSIS — I4891 Unspecified atrial fibrillation: Secondary | ICD-10-CM | POA: Diagnosis not present

## 2021-05-08 DIAGNOSIS — N189 Chronic kidney disease, unspecified: Secondary | ICD-10-CM | POA: Diagnosis not present

## 2021-05-08 DIAGNOSIS — N179 Acute kidney failure, unspecified: Secondary | ICD-10-CM | POA: Diagnosis not present

## 2021-05-12 DIAGNOSIS — M1990 Unspecified osteoarthritis, unspecified site: Secondary | ICD-10-CM | POA: Diagnosis not present

## 2021-05-12 DIAGNOSIS — R519 Headache, unspecified: Secondary | ICD-10-CM | POA: Diagnosis not present

## 2021-05-13 DIAGNOSIS — I4891 Unspecified atrial fibrillation: Secondary | ICD-10-CM | POA: Diagnosis not present

## 2021-05-15 DIAGNOSIS — I10 Essential (primary) hypertension: Secondary | ICD-10-CM | POA: Diagnosis not present

## 2021-05-15 DIAGNOSIS — M6281 Muscle weakness (generalized): Secondary | ICD-10-CM | POA: Diagnosis not present

## 2021-05-15 DIAGNOSIS — U071 COVID-19: Secondary | ICD-10-CM | POA: Diagnosis not present

## 2021-05-16 DIAGNOSIS — I4891 Unspecified atrial fibrillation: Secondary | ICD-10-CM | POA: Diagnosis not present

## 2021-05-18 DIAGNOSIS — U071 COVID-19: Secondary | ICD-10-CM | POA: Diagnosis not present

## 2021-05-18 DIAGNOSIS — M6281 Muscle weakness (generalized): Secondary | ICD-10-CM | POA: Diagnosis not present

## 2021-05-19 DIAGNOSIS — U071 COVID-19: Secondary | ICD-10-CM | POA: Diagnosis not present

## 2021-05-19 DIAGNOSIS — M6281 Muscle weakness (generalized): Secondary | ICD-10-CM | POA: Diagnosis not present

## 2021-05-20 DIAGNOSIS — N390 Urinary tract infection, site not specified: Secondary | ICD-10-CM | POA: Diagnosis not present

## 2021-05-20 DIAGNOSIS — D469 Myelodysplastic syndrome, unspecified: Secondary | ICD-10-CM | POA: Diagnosis not present

## 2021-05-20 DIAGNOSIS — M6281 Muscle weakness (generalized): Secondary | ICD-10-CM | POA: Diagnosis not present

## 2021-05-20 DIAGNOSIS — U071 COVID-19: Secondary | ICD-10-CM | POA: Diagnosis not present

## 2021-05-20 DIAGNOSIS — N189 Chronic kidney disease, unspecified: Secondary | ICD-10-CM | POA: Diagnosis not present

## 2021-05-22 DIAGNOSIS — M6281 Muscle weakness (generalized): Secondary | ICD-10-CM | POA: Diagnosis not present

## 2021-05-22 DIAGNOSIS — I4891 Unspecified atrial fibrillation: Secondary | ICD-10-CM | POA: Diagnosis not present

## 2021-05-22 DIAGNOSIS — U071 COVID-19: Secondary | ICD-10-CM | POA: Diagnosis not present

## 2021-05-25 DIAGNOSIS — I4891 Unspecified atrial fibrillation: Secondary | ICD-10-CM | POA: Diagnosis not present

## 2021-05-25 DIAGNOSIS — D649 Anemia, unspecified: Secondary | ICD-10-CM | POA: Diagnosis not present

## 2021-05-25 DIAGNOSIS — I10 Essential (primary) hypertension: Secondary | ICD-10-CM | POA: Diagnosis not present

## 2021-05-25 DIAGNOSIS — R791 Abnormal coagulation profile: Secondary | ICD-10-CM | POA: Diagnosis not present

## 2021-05-25 DIAGNOSIS — M6281 Muscle weakness (generalized): Secondary | ICD-10-CM | POA: Diagnosis not present

## 2021-05-25 DIAGNOSIS — R5383 Other fatigue: Secondary | ICD-10-CM | POA: Diagnosis not present

## 2021-05-25 DIAGNOSIS — U071 COVID-19: Secondary | ICD-10-CM | POA: Diagnosis not present

## 2021-05-26 DIAGNOSIS — U071 COVID-19: Secondary | ICD-10-CM | POA: Diagnosis not present

## 2021-05-26 DIAGNOSIS — D72829 Elevated white blood cell count, unspecified: Secondary | ICD-10-CM | POA: Diagnosis not present

## 2021-05-26 DIAGNOSIS — R791 Abnormal coagulation profile: Secondary | ICD-10-CM | POA: Diagnosis not present

## 2021-05-26 DIAGNOSIS — R300 Dysuria: Secondary | ICD-10-CM | POA: Diagnosis not present

## 2021-05-26 DIAGNOSIS — M6281 Muscle weakness (generalized): Secondary | ICD-10-CM | POA: Diagnosis not present

## 2021-05-27 DIAGNOSIS — U071 COVID-19: Secondary | ICD-10-CM | POA: Diagnosis not present

## 2021-05-27 DIAGNOSIS — I10 Essential (primary) hypertension: Secondary | ICD-10-CM | POA: Diagnosis not present

## 2021-05-27 DIAGNOSIS — M6281 Muscle weakness (generalized): Secondary | ICD-10-CM | POA: Diagnosis not present

## 2021-05-28 DIAGNOSIS — N390 Urinary tract infection, site not specified: Secondary | ICD-10-CM | POA: Diagnosis not present

## 2021-05-28 DIAGNOSIS — M6281 Muscle weakness (generalized): Secondary | ICD-10-CM | POA: Diagnosis not present

## 2021-05-28 DIAGNOSIS — N189 Chronic kidney disease, unspecified: Secondary | ICD-10-CM | POA: Diagnosis not present

## 2021-05-28 DIAGNOSIS — U071 COVID-19: Secondary | ICD-10-CM | POA: Diagnosis not present

## 2021-05-29 ENCOUNTER — Other Ambulatory Visit: Payer: Self-pay | Admitting: Osteopathic Medicine

## 2021-05-29 DIAGNOSIS — D469 Myelodysplastic syndrome, unspecified: Secondary | ICD-10-CM | POA: Diagnosis not present

## 2021-05-29 DIAGNOSIS — D72829 Elevated white blood cell count, unspecified: Secondary | ICD-10-CM | POA: Diagnosis not present

## 2021-05-29 DIAGNOSIS — I4891 Unspecified atrial fibrillation: Secondary | ICD-10-CM | POA: Diagnosis not present

## 2021-05-29 DIAGNOSIS — U071 COVID-19: Secondary | ICD-10-CM | POA: Diagnosis not present

## 2021-05-29 DIAGNOSIS — N390 Urinary tract infection, site not specified: Secondary | ICD-10-CM | POA: Diagnosis not present

## 2021-05-29 DIAGNOSIS — I480 Paroxysmal atrial fibrillation: Secondary | ICD-10-CM | POA: Diagnosis not present

## 2021-05-29 DIAGNOSIS — M6281 Muscle weakness (generalized): Secondary | ICD-10-CM | POA: Diagnosis not present

## 2021-06-01 DIAGNOSIS — U071 COVID-19: Secondary | ICD-10-CM | POA: Diagnosis not present

## 2021-06-01 DIAGNOSIS — M6281 Muscle weakness (generalized): Secondary | ICD-10-CM | POA: Diagnosis not present

## 2021-06-02 DIAGNOSIS — M6281 Muscle weakness (generalized): Secondary | ICD-10-CM | POA: Diagnosis not present

## 2021-06-02 DIAGNOSIS — Z5181 Encounter for therapeutic drug level monitoring: Secondary | ICD-10-CM | POA: Diagnosis not present

## 2021-06-02 DIAGNOSIS — Z7901 Long term (current) use of anticoagulants: Secondary | ICD-10-CM | POA: Diagnosis not present

## 2021-06-02 DIAGNOSIS — U071 COVID-19: Secondary | ICD-10-CM | POA: Diagnosis not present

## 2021-06-02 DIAGNOSIS — I480 Paroxysmal atrial fibrillation: Secondary | ICD-10-CM | POA: Diagnosis not present

## 2021-06-02 DIAGNOSIS — I4891 Unspecified atrial fibrillation: Secondary | ICD-10-CM | POA: Diagnosis not present

## 2021-06-03 DIAGNOSIS — M6281 Muscle weakness (generalized): Secondary | ICD-10-CM | POA: Diagnosis not present

## 2021-06-03 DIAGNOSIS — U071 COVID-19: Secondary | ICD-10-CM | POA: Diagnosis not present

## 2021-06-04 DIAGNOSIS — N390 Urinary tract infection, site not specified: Secondary | ICD-10-CM | POA: Diagnosis not present

## 2021-06-04 DIAGNOSIS — K529 Noninfective gastroenteritis and colitis, unspecified: Secondary | ICD-10-CM | POA: Diagnosis not present

## 2021-06-04 DIAGNOSIS — Z903 Acquired absence of stomach [part of]: Secondary | ICD-10-CM | POA: Diagnosis not present

## 2021-06-04 DIAGNOSIS — D649 Anemia, unspecified: Secondary | ICD-10-CM | POA: Diagnosis not present

## 2021-06-04 DIAGNOSIS — U071 COVID-19: Secondary | ICD-10-CM | POA: Diagnosis not present

## 2021-06-04 DIAGNOSIS — R791 Abnormal coagulation profile: Secondary | ICD-10-CM | POA: Diagnosis not present

## 2021-06-04 DIAGNOSIS — M6281 Muscle weakness (generalized): Secondary | ICD-10-CM | POA: Diagnosis not present

## 2021-06-05 DIAGNOSIS — D649 Anemia, unspecified: Secondary | ICD-10-CM | POA: Diagnosis not present

## 2021-06-05 DIAGNOSIS — I10 Essential (primary) hypertension: Secondary | ICD-10-CM | POA: Diagnosis not present

## 2021-06-05 DIAGNOSIS — D509 Iron deficiency anemia, unspecified: Secondary | ICD-10-CM | POA: Diagnosis not present

## 2021-06-06 DIAGNOSIS — I4891 Unspecified atrial fibrillation: Secondary | ICD-10-CM | POA: Diagnosis not present

## 2021-06-07 DIAGNOSIS — M6281 Muscle weakness (generalized): Secondary | ICD-10-CM | POA: Diagnosis not present

## 2021-06-07 DIAGNOSIS — U071 COVID-19: Secondary | ICD-10-CM | POA: Diagnosis not present

## 2021-06-08 DIAGNOSIS — R791 Abnormal coagulation profile: Secondary | ICD-10-CM | POA: Diagnosis not present

## 2021-06-09 DIAGNOSIS — M6281 Muscle weakness (generalized): Secondary | ICD-10-CM | POA: Diagnosis not present

## 2021-06-09 DIAGNOSIS — U071 COVID-19: Secondary | ICD-10-CM | POA: Diagnosis not present

## 2021-06-10 DIAGNOSIS — I4891 Unspecified atrial fibrillation: Secondary | ICD-10-CM | POA: Diagnosis not present

## 2021-06-10 DIAGNOSIS — M6281 Muscle weakness (generalized): Secondary | ICD-10-CM | POA: Diagnosis not present

## 2021-06-10 DIAGNOSIS — E790 Hyperuricemia without signs of inflammatory arthritis and tophaceous disease: Secondary | ICD-10-CM | POA: Diagnosis not present

## 2021-06-10 DIAGNOSIS — U071 COVID-19: Secondary | ICD-10-CM | POA: Diagnosis not present

## 2021-06-11 DIAGNOSIS — I10 Essential (primary) hypertension: Secondary | ICD-10-CM | POA: Diagnosis not present

## 2021-06-11 DIAGNOSIS — M810 Age-related osteoporosis without current pathological fracture: Secondary | ICD-10-CM | POA: Diagnosis not present

## 2021-06-11 DIAGNOSIS — K219 Gastro-esophageal reflux disease without esophagitis: Secondary | ICD-10-CM | POA: Diagnosis not present

## 2021-06-11 DIAGNOSIS — D539 Nutritional anemia, unspecified: Secondary | ICD-10-CM | POA: Diagnosis not present

## 2021-06-11 DIAGNOSIS — Z23 Encounter for immunization: Secondary | ICD-10-CM | POA: Diagnosis not present

## 2021-06-11 DIAGNOSIS — E559 Vitamin D deficiency, unspecified: Secondary | ICD-10-CM | POA: Diagnosis not present

## 2021-06-11 DIAGNOSIS — R2681 Unsteadiness on feet: Secondary | ICD-10-CM | POA: Diagnosis not present

## 2021-06-11 DIAGNOSIS — M48061 Spinal stenosis, lumbar region without neurogenic claudication: Secondary | ICD-10-CM | POA: Diagnosis not present

## 2021-06-11 DIAGNOSIS — Z8614 Personal history of Methicillin resistant Staphylococcus aureus infection: Secondary | ICD-10-CM | POA: Diagnosis not present

## 2021-06-11 DIAGNOSIS — D472 Monoclonal gammopathy: Secondary | ICD-10-CM | POA: Diagnosis not present

## 2021-06-11 DIAGNOSIS — H3530 Unspecified macular degeneration: Secondary | ICD-10-CM | POA: Diagnosis not present

## 2021-06-11 DIAGNOSIS — E538 Deficiency of other specified B group vitamins: Secondary | ICD-10-CM | POA: Diagnosis not present

## 2021-06-11 DIAGNOSIS — Z86711 Personal history of pulmonary embolism: Secondary | ICD-10-CM | POA: Diagnosis not present

## 2021-06-11 DIAGNOSIS — E870 Hyperosmolality and hypernatremia: Secondary | ICD-10-CM | POA: Diagnosis not present

## 2021-06-11 DIAGNOSIS — Q141 Congenital malformation of retina: Secondary | ICD-10-CM | POA: Diagnosis not present

## 2021-06-11 DIAGNOSIS — Z903 Acquired absence of stomach [part of]: Secondary | ICD-10-CM | POA: Diagnosis not present

## 2021-06-11 DIAGNOSIS — U071 COVID-19: Secondary | ICD-10-CM | POA: Diagnosis not present

## 2021-06-11 DIAGNOSIS — M6281 Muscle weakness (generalized): Secondary | ICD-10-CM | POA: Diagnosis not present

## 2021-06-11 DIAGNOSIS — D803 Selective deficiency of immunoglobulin G [IgG] subclasses: Secondary | ICD-10-CM | POA: Diagnosis not present

## 2021-06-11 DIAGNOSIS — I4891 Unspecified atrial fibrillation: Secondary | ICD-10-CM | POA: Diagnosis not present

## 2021-06-11 DIAGNOSIS — Z96659 Presence of unspecified artificial knee joint: Secondary | ICD-10-CM | POA: Diagnosis not present

## 2021-06-11 DIAGNOSIS — N1832 Chronic kidney disease, stage 3b: Secondary | ICD-10-CM | POA: Diagnosis not present

## 2021-06-11 DIAGNOSIS — R531 Weakness: Secondary | ICD-10-CM | POA: Diagnosis not present

## 2021-06-12 DIAGNOSIS — M6281 Muscle weakness (generalized): Secondary | ICD-10-CM | POA: Diagnosis not present

## 2021-06-12 DIAGNOSIS — U071 COVID-19: Secondary | ICD-10-CM | POA: Diagnosis not present

## 2021-06-12 DIAGNOSIS — R791 Abnormal coagulation profile: Secondary | ICD-10-CM | POA: Diagnosis not present

## 2021-06-14 DIAGNOSIS — I482 Chronic atrial fibrillation, unspecified: Secondary | ICD-10-CM | POA: Diagnosis not present

## 2021-06-15 DIAGNOSIS — M6281 Muscle weakness (generalized): Secondary | ICD-10-CM | POA: Diagnosis not present

## 2021-06-15 DIAGNOSIS — U071 COVID-19: Secondary | ICD-10-CM | POA: Diagnosis not present

## 2021-06-16 DIAGNOSIS — U071 COVID-19: Secondary | ICD-10-CM | POA: Diagnosis not present

## 2021-06-16 DIAGNOSIS — M6281 Muscle weakness (generalized): Secondary | ICD-10-CM | POA: Diagnosis not present

## 2021-06-17 DIAGNOSIS — M6281 Muscle weakness (generalized): Secondary | ICD-10-CM | POA: Diagnosis not present

## 2021-06-17 DIAGNOSIS — U071 COVID-19: Secondary | ICD-10-CM | POA: Diagnosis not present

## 2021-06-18 DIAGNOSIS — H353131 Nonexudative age-related macular degeneration, bilateral, early dry stage: Secondary | ICD-10-CM | POA: Diagnosis not present

## 2021-06-18 DIAGNOSIS — H5000 Unspecified esotropia: Secondary | ICD-10-CM | POA: Diagnosis not present

## 2021-06-19 DIAGNOSIS — R791 Abnormal coagulation profile: Secondary | ICD-10-CM | POA: Diagnosis not present

## 2021-06-19 DIAGNOSIS — U071 COVID-19: Secondary | ICD-10-CM | POA: Diagnosis not present

## 2021-06-19 DIAGNOSIS — M6281 Muscle weakness (generalized): Secondary | ICD-10-CM | POA: Diagnosis not present

## 2021-06-22 DIAGNOSIS — B952 Enterococcus as the cause of diseases classified elsewhere: Secondary | ICD-10-CM | POA: Diagnosis not present

## 2021-06-22 DIAGNOSIS — Z7901 Long term (current) use of anticoagulants: Secondary | ICD-10-CM | POA: Diagnosis not present

## 2021-06-22 DIAGNOSIS — R509 Fever, unspecified: Secondary | ICD-10-CM | POA: Diagnosis not present

## 2021-06-22 DIAGNOSIS — I480 Paroxysmal atrial fibrillation: Secondary | ICD-10-CM | POA: Diagnosis not present

## 2021-06-22 DIAGNOSIS — N390 Urinary tract infection, site not specified: Secondary | ICD-10-CM | POA: Diagnosis not present

## 2021-06-22 DIAGNOSIS — M6281 Muscle weakness (generalized): Secondary | ICD-10-CM | POA: Diagnosis not present

## 2021-06-22 DIAGNOSIS — K529 Noninfective gastroenteritis and colitis, unspecified: Secondary | ICD-10-CM | POA: Diagnosis not present

## 2021-06-22 DIAGNOSIS — U071 COVID-19: Secondary | ICD-10-CM | POA: Diagnosis not present

## 2021-06-23 DIAGNOSIS — Z2839 Other underimmunization status: Secondary | ICD-10-CM | POA: Diagnosis not present

## 2021-06-23 DIAGNOSIS — M6281 Muscle weakness (generalized): Secondary | ICD-10-CM | POA: Diagnosis not present

## 2021-06-23 DIAGNOSIS — U071 COVID-19: Secondary | ICD-10-CM | POA: Diagnosis not present

## 2021-06-23 DIAGNOSIS — M1000 Idiopathic gout, unspecified site: Secondary | ICD-10-CM | POA: Diagnosis not present

## 2021-06-24 DIAGNOSIS — D649 Anemia, unspecified: Secondary | ICD-10-CM | POA: Diagnosis not present

## 2021-06-24 DIAGNOSIS — U071 COVID-19: Secondary | ICD-10-CM | POA: Diagnosis not present

## 2021-06-24 DIAGNOSIS — M6281 Muscle weakness (generalized): Secondary | ICD-10-CM | POA: Diagnosis not present

## 2021-06-25 DIAGNOSIS — R791 Abnormal coagulation profile: Secondary | ICD-10-CM | POA: Diagnosis not present

## 2021-06-25 DIAGNOSIS — U071 COVID-19: Secondary | ICD-10-CM | POA: Diagnosis not present

## 2021-06-25 DIAGNOSIS — M6281 Muscle weakness (generalized): Secondary | ICD-10-CM | POA: Diagnosis not present

## 2021-06-29 DIAGNOSIS — U071 COVID-19: Secondary | ICD-10-CM | POA: Diagnosis not present

## 2021-06-29 DIAGNOSIS — E875 Hyperkalemia: Secondary | ICD-10-CM | POA: Diagnosis not present

## 2021-06-29 DIAGNOSIS — M6281 Muscle weakness (generalized): Secondary | ICD-10-CM | POA: Diagnosis not present

## 2021-06-29 DIAGNOSIS — D649 Anemia, unspecified: Secondary | ICD-10-CM | POA: Diagnosis not present

## 2021-06-30 DIAGNOSIS — M6281 Muscle weakness (generalized): Secondary | ICD-10-CM | POA: Diagnosis not present

## 2021-06-30 DIAGNOSIS — Z7901 Long term (current) use of anticoagulants: Secondary | ICD-10-CM | POA: Diagnosis not present

## 2021-06-30 DIAGNOSIS — I10 Essential (primary) hypertension: Secondary | ICD-10-CM | POA: Diagnosis not present

## 2021-06-30 DIAGNOSIS — U071 COVID-19: Secondary | ICD-10-CM | POA: Diagnosis not present

## 2021-07-01 DIAGNOSIS — M79662 Pain in left lower leg: Secondary | ICD-10-CM | POA: Diagnosis not present

## 2021-07-01 DIAGNOSIS — U071 COVID-19: Secondary | ICD-10-CM | POA: Diagnosis not present

## 2021-07-01 DIAGNOSIS — M25572 Pain in left ankle and joints of left foot: Secondary | ICD-10-CM | POA: Diagnosis not present

## 2021-07-01 DIAGNOSIS — M25562 Pain in left knee: Secondary | ICD-10-CM | POA: Diagnosis not present

## 2021-07-01 DIAGNOSIS — M79652 Pain in left thigh: Secondary | ICD-10-CM | POA: Diagnosis not present

## 2021-07-01 DIAGNOSIS — M6281 Muscle weakness (generalized): Secondary | ICD-10-CM | POA: Diagnosis not present

## 2021-07-01 DIAGNOSIS — I10 Essential (primary) hypertension: Secondary | ICD-10-CM | POA: Diagnosis not present

## 2021-07-02 DIAGNOSIS — U071 COVID-19: Secondary | ICD-10-CM | POA: Diagnosis not present

## 2021-07-02 DIAGNOSIS — M7989 Other specified soft tissue disorders: Secondary | ICD-10-CM | POA: Diagnosis not present

## 2021-07-02 DIAGNOSIS — M6281 Muscle weakness (generalized): Secondary | ICD-10-CM | POA: Diagnosis not present

## 2021-07-03 DIAGNOSIS — I82402 Acute embolism and thrombosis of unspecified deep veins of left lower extremity: Secondary | ICD-10-CM | POA: Diagnosis not present

## 2021-07-05 DIAGNOSIS — I480 Paroxysmal atrial fibrillation: Secondary | ICD-10-CM | POA: Diagnosis not present

## 2021-07-07 DIAGNOSIS — M6281 Muscle weakness (generalized): Secondary | ICD-10-CM | POA: Diagnosis not present

## 2021-07-07 DIAGNOSIS — D508 Other iron deficiency anemias: Secondary | ICD-10-CM | POA: Diagnosis not present

## 2021-07-07 DIAGNOSIS — U071 COVID-19: Secondary | ICD-10-CM | POA: Diagnosis not present

## 2021-07-07 DIAGNOSIS — R195 Other fecal abnormalities: Secondary | ICD-10-CM | POA: Diagnosis not present

## 2021-07-08 DIAGNOSIS — R791 Abnormal coagulation profile: Secondary | ICD-10-CM | POA: Diagnosis not present

## 2021-07-08 DIAGNOSIS — U071 COVID-19: Secondary | ICD-10-CM | POA: Diagnosis not present

## 2021-07-08 DIAGNOSIS — M6281 Muscle weakness (generalized): Secondary | ICD-10-CM | POA: Diagnosis not present

## 2021-07-09 DIAGNOSIS — U071 COVID-19: Secondary | ICD-10-CM | POA: Diagnosis not present

## 2021-07-09 DIAGNOSIS — M6281 Muscle weakness (generalized): Secondary | ICD-10-CM | POA: Diagnosis not present

## 2021-07-10 DIAGNOSIS — I82402 Acute embolism and thrombosis of unspecified deep veins of left lower extremity: Secondary | ICD-10-CM | POA: Diagnosis not present

## 2021-07-10 DIAGNOSIS — Z7901 Long term (current) use of anticoagulants: Secondary | ICD-10-CM | POA: Diagnosis not present

## 2021-07-10 DIAGNOSIS — U071 COVID-19: Secondary | ICD-10-CM | POA: Diagnosis not present

## 2021-07-10 DIAGNOSIS — Z5181 Encounter for therapeutic drug level monitoring: Secondary | ICD-10-CM | POA: Diagnosis not present

## 2021-07-10 DIAGNOSIS — M6281 Muscle weakness (generalized): Secondary | ICD-10-CM | POA: Diagnosis not present

## 2021-07-10 DIAGNOSIS — D469 Myelodysplastic syndrome, unspecified: Secondary | ICD-10-CM | POA: Diagnosis not present

## 2021-07-13 DIAGNOSIS — D649 Anemia, unspecified: Secondary | ICD-10-CM | POA: Diagnosis not present

## 2021-07-14 DIAGNOSIS — U071 COVID-19: Secondary | ICD-10-CM | POA: Diagnosis not present

## 2021-07-14 DIAGNOSIS — M6281 Muscle weakness (generalized): Secondary | ICD-10-CM | POA: Diagnosis not present

## 2021-07-14 DIAGNOSIS — I480 Paroxysmal atrial fibrillation: Secondary | ICD-10-CM | POA: Diagnosis not present

## 2021-07-15 DIAGNOSIS — D469 Myelodysplastic syndrome, unspecified: Secondary | ICD-10-CM | POA: Diagnosis not present

## 2021-07-15 DIAGNOSIS — M6281 Muscle weakness (generalized): Secondary | ICD-10-CM | POA: Diagnosis not present

## 2021-07-15 DIAGNOSIS — D649 Anemia, unspecified: Secondary | ICD-10-CM | POA: Diagnosis not present

## 2021-07-15 DIAGNOSIS — U071 COVID-19: Secondary | ICD-10-CM | POA: Diagnosis not present

## 2021-07-15 DIAGNOSIS — M1990 Unspecified osteoarthritis, unspecified site: Secondary | ICD-10-CM | POA: Diagnosis not present

## 2021-07-16 DIAGNOSIS — E538 Deficiency of other specified B group vitamins: Secondary | ICD-10-CM | POA: Diagnosis not present

## 2021-07-16 DIAGNOSIS — D631 Anemia in chronic kidney disease: Secondary | ICD-10-CM | POA: Diagnosis not present

## 2021-07-16 DIAGNOSIS — N184 Chronic kidney disease, stage 4 (severe): Secondary | ICD-10-CM | POA: Diagnosis not present

## 2021-07-16 DIAGNOSIS — I129 Hypertensive chronic kidney disease with stage 1 through stage 4 chronic kidney disease, or unspecified chronic kidney disease: Secondary | ICD-10-CM | POA: Diagnosis not present

## 2021-07-16 DIAGNOSIS — Z86718 Personal history of other venous thrombosis and embolism: Secondary | ICD-10-CM | POA: Diagnosis not present

## 2021-07-16 DIAGNOSIS — D539 Nutritional anemia, unspecified: Secondary | ICD-10-CM | POA: Diagnosis not present

## 2021-07-16 DIAGNOSIS — Z903 Acquired absence of stomach [part of]: Secondary | ICD-10-CM | POA: Diagnosis not present

## 2021-07-16 DIAGNOSIS — Z86711 Personal history of pulmonary embolism: Secondary | ICD-10-CM | POA: Diagnosis not present

## 2021-07-16 DIAGNOSIS — D472 Monoclonal gammopathy: Secondary | ICD-10-CM | POA: Diagnosis not present

## 2021-07-16 DIAGNOSIS — D638 Anemia in other chronic diseases classified elsewhere: Secondary | ICD-10-CM | POA: Diagnosis not present

## 2021-07-16 DIAGNOSIS — D500 Iron deficiency anemia secondary to blood loss (chronic): Secondary | ICD-10-CM | POA: Diagnosis not present

## 2021-07-16 DIAGNOSIS — I480 Paroxysmal atrial fibrillation: Secondary | ICD-10-CM | POA: Diagnosis not present

## 2021-07-16 DIAGNOSIS — Z79899 Other long term (current) drug therapy: Secondary | ICD-10-CM | POA: Diagnosis not present

## 2021-07-16 DIAGNOSIS — I10 Essential (primary) hypertension: Secondary | ICD-10-CM | POA: Diagnosis not present

## 2021-07-17 DIAGNOSIS — U071 COVID-19: Secondary | ICD-10-CM | POA: Diagnosis not present

## 2021-07-17 DIAGNOSIS — M6281 Muscle weakness (generalized): Secondary | ICD-10-CM | POA: Diagnosis not present

## 2021-07-20 DIAGNOSIS — M6281 Muscle weakness (generalized): Secondary | ICD-10-CM | POA: Diagnosis not present

## 2021-07-20 DIAGNOSIS — U071 COVID-19: Secondary | ICD-10-CM | POA: Diagnosis not present

## 2021-07-21 DIAGNOSIS — M1990 Unspecified osteoarthritis, unspecified site: Secondary | ICD-10-CM | POA: Diagnosis not present

## 2021-07-21 DIAGNOSIS — D469 Myelodysplastic syndrome, unspecified: Secondary | ICD-10-CM | POA: Diagnosis not present

## 2021-07-21 DIAGNOSIS — M6281 Muscle weakness (generalized): Secondary | ICD-10-CM | POA: Diagnosis not present

## 2021-07-21 DIAGNOSIS — I82402 Acute embolism and thrombosis of unspecified deep veins of left lower extremity: Secondary | ICD-10-CM | POA: Diagnosis not present

## 2021-07-21 DIAGNOSIS — I480 Paroxysmal atrial fibrillation: Secondary | ICD-10-CM | POA: Diagnosis not present

## 2021-07-21 DIAGNOSIS — U071 COVID-19: Secondary | ICD-10-CM | POA: Diagnosis not present

## 2021-07-22 DIAGNOSIS — U071 COVID-19: Secondary | ICD-10-CM | POA: Diagnosis not present

## 2021-07-22 DIAGNOSIS — M6281 Muscle weakness (generalized): Secondary | ICD-10-CM | POA: Diagnosis not present

## 2021-07-23 DIAGNOSIS — M6281 Muscle weakness (generalized): Secondary | ICD-10-CM | POA: Diagnosis not present

## 2021-07-23 DIAGNOSIS — U071 COVID-19: Secondary | ICD-10-CM | POA: Diagnosis not present

## 2021-07-24 DIAGNOSIS — M6281 Muscle weakness (generalized): Secondary | ICD-10-CM | POA: Diagnosis not present

## 2021-07-24 DIAGNOSIS — U071 COVID-19: Secondary | ICD-10-CM | POA: Diagnosis not present

## 2021-07-27 DIAGNOSIS — U071 COVID-19: Secondary | ICD-10-CM | POA: Diagnosis not present

## 2021-07-27 DIAGNOSIS — M6281 Muscle weakness (generalized): Secondary | ICD-10-CM | POA: Diagnosis not present

## 2021-07-28 DIAGNOSIS — I484 Atypical atrial flutter: Secondary | ICD-10-CM | POA: Diagnosis not present

## 2021-07-28 DIAGNOSIS — M6281 Muscle weakness (generalized): Secondary | ICD-10-CM | POA: Diagnosis not present

## 2021-07-28 DIAGNOSIS — U071 COVID-19: Secondary | ICD-10-CM | POA: Diagnosis not present

## 2021-07-29 DIAGNOSIS — M6281 Muscle weakness (generalized): Secondary | ICD-10-CM | POA: Diagnosis not present

## 2021-07-29 DIAGNOSIS — L03116 Cellulitis of left lower limb: Secondary | ICD-10-CM | POA: Diagnosis not present

## 2021-07-29 DIAGNOSIS — I82409 Acute embolism and thrombosis of unspecified deep veins of unspecified lower extremity: Secondary | ICD-10-CM | POA: Diagnosis not present

## 2021-07-29 DIAGNOSIS — M1990 Unspecified osteoarthritis, unspecified site: Secondary | ICD-10-CM | POA: Diagnosis not present

## 2021-07-29 DIAGNOSIS — U071 COVID-19: Secondary | ICD-10-CM | POA: Diagnosis not present

## 2021-08-17 ENCOUNTER — Other Ambulatory Visit: Payer: Self-pay | Admitting: Sports Medicine

## 2021-08-17 NOTE — Telephone Encounter (Signed)
Left msg for patient that refill was sent to pharmacy.

## 2021-08-31 DIAGNOSIS — I129 Hypertensive chronic kidney disease with stage 1 through stage 4 chronic kidney disease, or unspecified chronic kidney disease: Secondary | ICD-10-CM | POA: Diagnosis not present

## 2021-08-31 DIAGNOSIS — K859 Acute pancreatitis without necrosis or infection, unspecified: Secondary | ICD-10-CM | POA: Diagnosis not present

## 2021-08-31 DIAGNOSIS — N184 Chronic kidney disease, stage 4 (severe): Secondary | ICD-10-CM | POA: Diagnosis not present

## 2021-08-31 DIAGNOSIS — R1312 Dysphagia, oropharyngeal phase: Secondary | ICD-10-CM | POA: Diagnosis not present

## 2021-08-31 DIAGNOSIS — R2681 Unsteadiness on feet: Secondary | ICD-10-CM | POA: Diagnosis not present

## 2021-08-31 DIAGNOSIS — M6281 Muscle weakness (generalized): Secondary | ICD-10-CM | POA: Diagnosis not present

## 2021-09-01 DIAGNOSIS — R1312 Dysphagia, oropharyngeal phase: Secondary | ICD-10-CM | POA: Diagnosis not present

## 2021-09-01 DIAGNOSIS — R2681 Unsteadiness on feet: Secondary | ICD-10-CM | POA: Diagnosis not present

## 2021-09-01 DIAGNOSIS — N184 Chronic kidney disease, stage 4 (severe): Secondary | ICD-10-CM | POA: Diagnosis not present

## 2021-09-01 DIAGNOSIS — I129 Hypertensive chronic kidney disease with stage 1 through stage 4 chronic kidney disease, or unspecified chronic kidney disease: Secondary | ICD-10-CM | POA: Diagnosis not present

## 2021-09-01 DIAGNOSIS — M6281 Muscle weakness (generalized): Secondary | ICD-10-CM | POA: Diagnosis not present

## 2021-09-01 DIAGNOSIS — K859 Acute pancreatitis without necrosis or infection, unspecified: Secondary | ICD-10-CM | POA: Diagnosis not present

## 2021-09-02 DIAGNOSIS — D803 Selective deficiency of immunoglobulin G [IgG] subclasses: Secondary | ICD-10-CM | POA: Diagnosis not present

## 2021-09-02 DIAGNOSIS — I129 Hypertensive chronic kidney disease with stage 1 through stage 4 chronic kidney disease, or unspecified chronic kidney disease: Secondary | ICD-10-CM | POA: Diagnosis not present

## 2021-09-02 DIAGNOSIS — I4821 Permanent atrial fibrillation: Secondary | ICD-10-CM | POA: Diagnosis not present

## 2021-09-02 DIAGNOSIS — R1312 Dysphagia, oropharyngeal phase: Secondary | ICD-10-CM | POA: Diagnosis not present

## 2021-09-02 DIAGNOSIS — I951 Orthostatic hypotension: Secondary | ICD-10-CM | POA: Diagnosis not present

## 2021-09-02 DIAGNOSIS — D638 Anemia in other chronic diseases classified elsewhere: Secondary | ICD-10-CM | POA: Diagnosis not present

## 2021-09-02 DIAGNOSIS — D500 Iron deficiency anemia secondary to blood loss (chronic): Secondary | ICD-10-CM | POA: Diagnosis not present

## 2021-09-02 DIAGNOSIS — I82409 Acute embolism and thrombosis of unspecified deep veins of unspecified lower extremity: Secondary | ICD-10-CM | POA: Diagnosis not present

## 2021-09-02 DIAGNOSIS — K859 Acute pancreatitis without necrosis or infection, unspecified: Secondary | ICD-10-CM | POA: Diagnosis not present

## 2021-09-02 DIAGNOSIS — R2681 Unsteadiness on feet: Secondary | ICD-10-CM | POA: Diagnosis not present

## 2021-09-02 DIAGNOSIS — I10 Essential (primary) hypertension: Secondary | ICD-10-CM | POA: Diagnosis not present

## 2021-09-02 DIAGNOSIS — M6281 Muscle weakness (generalized): Secondary | ICD-10-CM | POA: Diagnosis not present

## 2021-09-02 DIAGNOSIS — I82509 Chronic embolism and thrombosis of unspecified deep veins of unspecified lower extremity: Secondary | ICD-10-CM | POA: Diagnosis not present

## 2021-09-02 DIAGNOSIS — N184 Chronic kidney disease, stage 4 (severe): Secondary | ICD-10-CM | POA: Diagnosis not present

## 2021-09-03 DIAGNOSIS — R2681 Unsteadiness on feet: Secondary | ICD-10-CM | POA: Diagnosis not present

## 2021-09-03 DIAGNOSIS — I129 Hypertensive chronic kidney disease with stage 1 through stage 4 chronic kidney disease, or unspecified chronic kidney disease: Secondary | ICD-10-CM | POA: Diagnosis not present

## 2021-09-03 DIAGNOSIS — M6281 Muscle weakness (generalized): Secondary | ICD-10-CM | POA: Diagnosis not present

## 2021-09-03 DIAGNOSIS — R1312 Dysphagia, oropharyngeal phase: Secondary | ICD-10-CM | POA: Diagnosis not present

## 2021-09-03 DIAGNOSIS — K859 Acute pancreatitis without necrosis or infection, unspecified: Secondary | ICD-10-CM | POA: Diagnosis not present

## 2021-09-03 DIAGNOSIS — N184 Chronic kidney disease, stage 4 (severe): Secondary | ICD-10-CM | POA: Diagnosis not present

## 2021-09-04 DIAGNOSIS — M6281 Muscle weakness (generalized): Secondary | ICD-10-CM | POA: Diagnosis not present

## 2021-09-04 DIAGNOSIS — R2681 Unsteadiness on feet: Secondary | ICD-10-CM | POA: Diagnosis not present

## 2021-09-04 DIAGNOSIS — N184 Chronic kidney disease, stage 4 (severe): Secondary | ICD-10-CM | POA: Diagnosis not present

## 2021-09-04 DIAGNOSIS — R1312 Dysphagia, oropharyngeal phase: Secondary | ICD-10-CM | POA: Diagnosis not present

## 2021-09-04 DIAGNOSIS — I129 Hypertensive chronic kidney disease with stage 1 through stage 4 chronic kidney disease, or unspecified chronic kidney disease: Secondary | ICD-10-CM | POA: Diagnosis not present

## 2021-09-04 DIAGNOSIS — K859 Acute pancreatitis without necrosis or infection, unspecified: Secondary | ICD-10-CM | POA: Diagnosis not present

## 2021-09-06 DIAGNOSIS — R600 Localized edema: Secondary | ICD-10-CM | POA: Diagnosis not present

## 2021-09-07 DIAGNOSIS — R2681 Unsteadiness on feet: Secondary | ICD-10-CM | POA: Diagnosis not present

## 2021-09-07 DIAGNOSIS — K859 Acute pancreatitis without necrosis or infection, unspecified: Secondary | ICD-10-CM | POA: Diagnosis not present

## 2021-09-07 DIAGNOSIS — R1312 Dysphagia, oropharyngeal phase: Secondary | ICD-10-CM | POA: Diagnosis not present

## 2021-09-07 DIAGNOSIS — M6281 Muscle weakness (generalized): Secondary | ICD-10-CM | POA: Diagnosis not present

## 2021-09-07 DIAGNOSIS — N184 Chronic kidney disease, stage 4 (severe): Secondary | ICD-10-CM | POA: Diagnosis not present

## 2021-09-07 DIAGNOSIS — I129 Hypertensive chronic kidney disease with stage 1 through stage 4 chronic kidney disease, or unspecified chronic kidney disease: Secondary | ICD-10-CM | POA: Diagnosis not present

## 2021-09-07 DIAGNOSIS — M7989 Other specified soft tissue disorders: Secondary | ICD-10-CM | POA: Diagnosis not present

## 2021-09-08 DIAGNOSIS — M6281 Muscle weakness (generalized): Secondary | ICD-10-CM | POA: Diagnosis not present

## 2021-09-08 DIAGNOSIS — K859 Acute pancreatitis without necrosis or infection, unspecified: Secondary | ICD-10-CM | POA: Diagnosis not present

## 2021-09-08 DIAGNOSIS — Z7901 Long term (current) use of anticoagulants: Secondary | ICD-10-CM | POA: Diagnosis not present

## 2021-09-08 DIAGNOSIS — N184 Chronic kidney disease, stage 4 (severe): Secondary | ICD-10-CM | POA: Diagnosis not present

## 2021-09-08 DIAGNOSIS — I129 Hypertensive chronic kidney disease with stage 1 through stage 4 chronic kidney disease, or unspecified chronic kidney disease: Secondary | ICD-10-CM | POA: Diagnosis not present

## 2021-09-08 DIAGNOSIS — I82409 Acute embolism and thrombosis of unspecified deep veins of unspecified lower extremity: Secondary | ICD-10-CM | POA: Diagnosis not present

## 2021-09-08 DIAGNOSIS — R2681 Unsteadiness on feet: Secondary | ICD-10-CM | POA: Diagnosis not present

## 2021-09-08 DIAGNOSIS — R1312 Dysphagia, oropharyngeal phase: Secondary | ICD-10-CM | POA: Diagnosis not present

## 2021-09-09 DIAGNOSIS — I129 Hypertensive chronic kidney disease with stage 1 through stage 4 chronic kidney disease, or unspecified chronic kidney disease: Secondary | ICD-10-CM | POA: Diagnosis not present

## 2021-09-09 DIAGNOSIS — R2681 Unsteadiness on feet: Secondary | ICD-10-CM | POA: Diagnosis not present

## 2021-09-09 DIAGNOSIS — R600 Localized edema: Secondary | ICD-10-CM | POA: Diagnosis not present

## 2021-09-09 DIAGNOSIS — M6281 Muscle weakness (generalized): Secondary | ICD-10-CM | POA: Diagnosis not present

## 2021-09-09 DIAGNOSIS — T8459XA Infection and inflammatory reaction due to other internal joint prosthesis, initial encounter: Secondary | ICD-10-CM | POA: Diagnosis not present

## 2021-09-09 DIAGNOSIS — N184 Chronic kidney disease, stage 4 (severe): Secondary | ICD-10-CM | POA: Diagnosis not present

## 2021-09-09 DIAGNOSIS — I10 Essential (primary) hypertension: Secondary | ICD-10-CM | POA: Diagnosis not present

## 2021-09-09 DIAGNOSIS — R1312 Dysphagia, oropharyngeal phase: Secondary | ICD-10-CM | POA: Diagnosis not present

## 2021-09-09 DIAGNOSIS — I82409 Acute embolism and thrombosis of unspecified deep veins of unspecified lower extremity: Secondary | ICD-10-CM | POA: Diagnosis not present

## 2021-09-09 DIAGNOSIS — K859 Acute pancreatitis without necrosis or infection, unspecified: Secondary | ICD-10-CM | POA: Diagnosis not present

## 2021-09-09 DIAGNOSIS — N189 Chronic kidney disease, unspecified: Secondary | ICD-10-CM | POA: Diagnosis not present

## 2021-09-10 DIAGNOSIS — I129 Hypertensive chronic kidney disease with stage 1 through stage 4 chronic kidney disease, or unspecified chronic kidney disease: Secondary | ICD-10-CM | POA: Diagnosis not present

## 2021-09-10 DIAGNOSIS — K859 Acute pancreatitis without necrosis or infection, unspecified: Secondary | ICD-10-CM | POA: Diagnosis not present

## 2021-09-10 DIAGNOSIS — R2681 Unsteadiness on feet: Secondary | ICD-10-CM | POA: Diagnosis not present

## 2021-09-10 DIAGNOSIS — N184 Chronic kidney disease, stage 4 (severe): Secondary | ICD-10-CM | POA: Diagnosis not present

## 2021-09-10 DIAGNOSIS — R1312 Dysphagia, oropharyngeal phase: Secondary | ICD-10-CM | POA: Diagnosis not present

## 2021-09-10 DIAGNOSIS — M6281 Muscle weakness (generalized): Secondary | ICD-10-CM | POA: Diagnosis not present

## 2021-09-11 DIAGNOSIS — I129 Hypertensive chronic kidney disease with stage 1 through stage 4 chronic kidney disease, or unspecified chronic kidney disease: Secondary | ICD-10-CM | POA: Diagnosis not present

## 2021-09-11 DIAGNOSIS — R2681 Unsteadiness on feet: Secondary | ICD-10-CM | POA: Diagnosis not present

## 2021-09-11 DIAGNOSIS — K859 Acute pancreatitis without necrosis or infection, unspecified: Secondary | ICD-10-CM | POA: Diagnosis not present

## 2021-09-11 DIAGNOSIS — R1312 Dysphagia, oropharyngeal phase: Secondary | ICD-10-CM | POA: Diagnosis not present

## 2021-09-11 DIAGNOSIS — N184 Chronic kidney disease, stage 4 (severe): Secondary | ICD-10-CM | POA: Diagnosis not present

## 2021-09-11 DIAGNOSIS — M6281 Muscle weakness (generalized): Secondary | ICD-10-CM | POA: Diagnosis not present

## 2021-09-14 DIAGNOSIS — M6281 Muscle weakness (generalized): Secondary | ICD-10-CM | POA: Diagnosis not present

## 2021-09-14 DIAGNOSIS — N184 Chronic kidney disease, stage 4 (severe): Secondary | ICD-10-CM | POA: Diagnosis not present

## 2021-09-14 DIAGNOSIS — R1312 Dysphagia, oropharyngeal phase: Secondary | ICD-10-CM | POA: Diagnosis not present

## 2021-09-14 DIAGNOSIS — I129 Hypertensive chronic kidney disease with stage 1 through stage 4 chronic kidney disease, or unspecified chronic kidney disease: Secondary | ICD-10-CM | POA: Diagnosis not present

## 2021-09-14 DIAGNOSIS — R2681 Unsteadiness on feet: Secondary | ICD-10-CM | POA: Diagnosis not present

## 2021-09-14 DIAGNOSIS — K859 Acute pancreatitis without necrosis or infection, unspecified: Secondary | ICD-10-CM | POA: Diagnosis not present

## 2021-09-16 DIAGNOSIS — T8459XD Infection and inflammatory reaction due to other internal joint prosthesis, subsequent encounter: Secondary | ICD-10-CM | POA: Diagnosis not present

## 2021-09-16 DIAGNOSIS — D803 Selective deficiency of immunoglobulin G [IgG] subclasses: Secondary | ICD-10-CM | POA: Diagnosis not present

## 2021-09-16 DIAGNOSIS — B957 Other staphylococcus as the cause of diseases classified elsewhere: Secondary | ICD-10-CM | POA: Diagnosis not present

## 2021-09-16 DIAGNOSIS — I82592 Chronic embolism and thrombosis of other specified deep vein of left lower extremity: Secondary | ICD-10-CM | POA: Diagnosis not present

## 2021-09-16 DIAGNOSIS — Z96659 Presence of unspecified artificial knee joint: Secondary | ICD-10-CM | POA: Diagnosis not present

## 2021-09-16 DIAGNOSIS — Z881 Allergy status to other antibiotic agents status: Secondary | ICD-10-CM | POA: Diagnosis not present

## 2021-09-16 DIAGNOSIS — R197 Diarrhea, unspecified: Secondary | ICD-10-CM | POA: Diagnosis not present

## 2021-09-16 DIAGNOSIS — B950 Streptococcus, group A, as the cause of diseases classified elsewhere: Secondary | ICD-10-CM | POA: Diagnosis not present

## 2021-09-22 DIAGNOSIS — K859 Acute pancreatitis without necrosis or infection, unspecified: Secondary | ICD-10-CM | POA: Diagnosis not present

## 2021-09-22 DIAGNOSIS — N189 Chronic kidney disease, unspecified: Secondary | ICD-10-CM | POA: Diagnosis not present

## 2021-09-22 DIAGNOSIS — I82409 Acute embolism and thrombosis of unspecified deep veins of unspecified lower extremity: Secondary | ICD-10-CM | POA: Diagnosis not present

## 2021-09-22 DIAGNOSIS — I10 Essential (primary) hypertension: Secondary | ICD-10-CM | POA: Diagnosis not present

## 2021-09-23 DIAGNOSIS — Z8614 Personal history of Methicillin resistant Staphylococcus aureus infection: Secondary | ICD-10-CM | POA: Diagnosis not present

## 2021-09-30 DIAGNOSIS — I82409 Acute embolism and thrombosis of unspecified deep veins of unspecified lower extremity: Secondary | ICD-10-CM | POA: Diagnosis not present

## 2021-09-30 DIAGNOSIS — K859 Acute pancreatitis without necrosis or infection, unspecified: Secondary | ICD-10-CM | POA: Diagnosis not present

## 2021-09-30 DIAGNOSIS — I10 Essential (primary) hypertension: Secondary | ICD-10-CM | POA: Diagnosis not present

## 2021-09-30 DIAGNOSIS — M436 Torticollis: Secondary | ICD-10-CM | POA: Diagnosis not present

## 2021-09-30 DIAGNOSIS — T8459XA Infection and inflammatory reaction due to other internal joint prosthesis, initial encounter: Secondary | ICD-10-CM | POA: Diagnosis not present

## 2021-09-30 DIAGNOSIS — N189 Chronic kidney disease, unspecified: Secondary | ICD-10-CM | POA: Diagnosis not present

## 2021-09-30 DIAGNOSIS — R600 Localized edema: Secondary | ICD-10-CM | POA: Diagnosis not present

## 2021-10-02 ENCOUNTER — Ambulatory Visit: Payer: Medicare Other | Admitting: Family Medicine

## 2021-10-06 DIAGNOSIS — M19032 Primary osteoarthritis, left wrist: Secondary | ICD-10-CM | POA: Diagnosis not present

## 2021-10-06 DIAGNOSIS — I129 Hypertensive chronic kidney disease with stage 1 through stage 4 chronic kidney disease, or unspecified chronic kidney disease: Secondary | ICD-10-CM | POA: Diagnosis not present

## 2021-10-06 DIAGNOSIS — R2681 Unsteadiness on feet: Secondary | ICD-10-CM | POA: Diagnosis not present

## 2021-10-06 DIAGNOSIS — M859 Disorder of bone density and structure, unspecified: Secondary | ICD-10-CM | POA: Diagnosis not present

## 2021-10-06 DIAGNOSIS — M8580 Other specified disorders of bone density and structure, unspecified site: Secondary | ICD-10-CM | POA: Diagnosis not present

## 2021-10-06 DIAGNOSIS — M6281 Muscle weakness (generalized): Secondary | ICD-10-CM | POA: Diagnosis not present

## 2021-10-06 DIAGNOSIS — K859 Acute pancreatitis without necrosis or infection, unspecified: Secondary | ICD-10-CM | POA: Diagnosis not present

## 2021-10-06 DIAGNOSIS — N184 Chronic kidney disease, stage 4 (severe): Secondary | ICD-10-CM | POA: Diagnosis not present

## 2021-10-07 DIAGNOSIS — N184 Chronic kidney disease, stage 4 (severe): Secondary | ICD-10-CM | POA: Diagnosis not present

## 2021-10-07 DIAGNOSIS — M6281 Muscle weakness (generalized): Secondary | ICD-10-CM | POA: Diagnosis not present

## 2021-10-07 DIAGNOSIS — K859 Acute pancreatitis without necrosis or infection, unspecified: Secondary | ICD-10-CM | POA: Diagnosis not present

## 2021-10-07 DIAGNOSIS — M19032 Primary osteoarthritis, left wrist: Secondary | ICD-10-CM | POA: Diagnosis not present

## 2021-10-07 DIAGNOSIS — M8580 Other specified disorders of bone density and structure, unspecified site: Secondary | ICD-10-CM | POA: Diagnosis not present

## 2021-10-07 DIAGNOSIS — R2681 Unsteadiness on feet: Secondary | ICD-10-CM | POA: Diagnosis not present

## 2021-10-07 DIAGNOSIS — M859 Disorder of bone density and structure, unspecified: Secondary | ICD-10-CM | POA: Diagnosis not present

## 2021-10-07 DIAGNOSIS — I129 Hypertensive chronic kidney disease with stage 1 through stage 4 chronic kidney disease, or unspecified chronic kidney disease: Secondary | ICD-10-CM | POA: Diagnosis not present

## 2021-10-08 DIAGNOSIS — M19032 Primary osteoarthritis, left wrist: Secondary | ICD-10-CM | POA: Diagnosis not present

## 2021-10-08 DIAGNOSIS — M6281 Muscle weakness (generalized): Secondary | ICD-10-CM | POA: Diagnosis not present

## 2021-10-08 DIAGNOSIS — M859 Disorder of bone density and structure, unspecified: Secondary | ICD-10-CM | POA: Diagnosis not present

## 2021-10-08 DIAGNOSIS — K859 Acute pancreatitis without necrosis or infection, unspecified: Secondary | ICD-10-CM | POA: Diagnosis not present

## 2021-10-08 DIAGNOSIS — M8580 Other specified disorders of bone density and structure, unspecified site: Secondary | ICD-10-CM | POA: Diagnosis not present

## 2021-10-08 DIAGNOSIS — N184 Chronic kidney disease, stage 4 (severe): Secondary | ICD-10-CM | POA: Diagnosis not present

## 2021-10-08 DIAGNOSIS — I129 Hypertensive chronic kidney disease with stage 1 through stage 4 chronic kidney disease, or unspecified chronic kidney disease: Secondary | ICD-10-CM | POA: Diagnosis not present

## 2021-10-08 DIAGNOSIS — R2681 Unsteadiness on feet: Secondary | ICD-10-CM | POA: Diagnosis not present

## 2021-10-09 DIAGNOSIS — K859 Acute pancreatitis without necrosis or infection, unspecified: Secondary | ICD-10-CM | POA: Diagnosis not present

## 2021-10-09 DIAGNOSIS — M8580 Other specified disorders of bone density and structure, unspecified site: Secondary | ICD-10-CM | POA: Diagnosis not present

## 2021-10-09 DIAGNOSIS — R2681 Unsteadiness on feet: Secondary | ICD-10-CM | POA: Diagnosis not present

## 2021-10-09 DIAGNOSIS — M6281 Muscle weakness (generalized): Secondary | ICD-10-CM | POA: Diagnosis not present

## 2021-10-09 DIAGNOSIS — M19032 Primary osteoarthritis, left wrist: Secondary | ICD-10-CM | POA: Diagnosis not present

## 2021-10-09 DIAGNOSIS — M859 Disorder of bone density and structure, unspecified: Secondary | ICD-10-CM | POA: Diagnosis not present

## 2021-10-09 DIAGNOSIS — N184 Chronic kidney disease, stage 4 (severe): Secondary | ICD-10-CM | POA: Diagnosis not present

## 2021-10-09 DIAGNOSIS — I129 Hypertensive chronic kidney disease with stage 1 through stage 4 chronic kidney disease, or unspecified chronic kidney disease: Secondary | ICD-10-CM | POA: Diagnosis not present

## 2021-10-12 DIAGNOSIS — M859 Disorder of bone density and structure, unspecified: Secondary | ICD-10-CM | POA: Diagnosis not present

## 2021-10-12 DIAGNOSIS — N184 Chronic kidney disease, stage 4 (severe): Secondary | ICD-10-CM | POA: Diagnosis not present

## 2021-10-12 DIAGNOSIS — I129 Hypertensive chronic kidney disease with stage 1 through stage 4 chronic kidney disease, or unspecified chronic kidney disease: Secondary | ICD-10-CM | POA: Diagnosis not present

## 2021-10-12 DIAGNOSIS — M8580 Other specified disorders of bone density and structure, unspecified site: Secondary | ICD-10-CM | POA: Diagnosis not present

## 2021-10-12 DIAGNOSIS — K859 Acute pancreatitis without necrosis or infection, unspecified: Secondary | ICD-10-CM | POA: Diagnosis not present

## 2021-10-12 DIAGNOSIS — R2681 Unsteadiness on feet: Secondary | ICD-10-CM | POA: Diagnosis not present

## 2021-10-12 DIAGNOSIS — M19032 Primary osteoarthritis, left wrist: Secondary | ICD-10-CM | POA: Diagnosis not present

## 2021-10-12 DIAGNOSIS — M6281 Muscle weakness (generalized): Secondary | ICD-10-CM | POA: Diagnosis not present

## 2021-10-14 DIAGNOSIS — M19032 Primary osteoarthritis, left wrist: Secondary | ICD-10-CM | POA: Diagnosis not present

## 2021-10-14 DIAGNOSIS — K859 Acute pancreatitis without necrosis or infection, unspecified: Secondary | ICD-10-CM | POA: Diagnosis not present

## 2021-10-14 DIAGNOSIS — M6281 Muscle weakness (generalized): Secondary | ICD-10-CM | POA: Diagnosis not present

## 2021-10-14 DIAGNOSIS — M8580 Other specified disorders of bone density and structure, unspecified site: Secondary | ICD-10-CM | POA: Diagnosis not present

## 2021-10-14 DIAGNOSIS — N184 Chronic kidney disease, stage 4 (severe): Secondary | ICD-10-CM | POA: Diagnosis not present

## 2021-10-14 DIAGNOSIS — I129 Hypertensive chronic kidney disease with stage 1 through stage 4 chronic kidney disease, or unspecified chronic kidney disease: Secondary | ICD-10-CM | POA: Diagnosis not present

## 2021-10-14 DIAGNOSIS — R2681 Unsteadiness on feet: Secondary | ICD-10-CM | POA: Diagnosis not present

## 2021-10-14 DIAGNOSIS — M859 Disorder of bone density and structure, unspecified: Secondary | ICD-10-CM | POA: Diagnosis not present

## 2021-10-15 DIAGNOSIS — K859 Acute pancreatitis without necrosis or infection, unspecified: Secondary | ICD-10-CM | POA: Diagnosis not present

## 2021-10-15 DIAGNOSIS — R2681 Unsteadiness on feet: Secondary | ICD-10-CM | POA: Diagnosis not present

## 2021-10-15 DIAGNOSIS — M8580 Other specified disorders of bone density and structure, unspecified site: Secondary | ICD-10-CM | POA: Diagnosis not present

## 2021-10-15 DIAGNOSIS — M6281 Muscle weakness (generalized): Secondary | ICD-10-CM | POA: Diagnosis not present

## 2021-10-15 DIAGNOSIS — M859 Disorder of bone density and structure, unspecified: Secondary | ICD-10-CM | POA: Diagnosis not present

## 2021-10-15 DIAGNOSIS — Z8614 Personal history of Methicillin resistant Staphylococcus aureus infection: Secondary | ICD-10-CM | POA: Diagnosis not present

## 2021-10-15 DIAGNOSIS — I129 Hypertensive chronic kidney disease with stage 1 through stage 4 chronic kidney disease, or unspecified chronic kidney disease: Secondary | ICD-10-CM | POA: Diagnosis not present

## 2021-10-15 DIAGNOSIS — N184 Chronic kidney disease, stage 4 (severe): Secondary | ICD-10-CM | POA: Diagnosis not present

## 2021-10-15 DIAGNOSIS — M19032 Primary osteoarthritis, left wrist: Secondary | ICD-10-CM | POA: Diagnosis not present

## 2021-10-16 DIAGNOSIS — M859 Disorder of bone density and structure, unspecified: Secondary | ICD-10-CM | POA: Diagnosis not present

## 2021-10-16 DIAGNOSIS — R2681 Unsteadiness on feet: Secondary | ICD-10-CM | POA: Diagnosis not present

## 2021-10-16 DIAGNOSIS — N184 Chronic kidney disease, stage 4 (severe): Secondary | ICD-10-CM | POA: Diagnosis not present

## 2021-10-16 DIAGNOSIS — M6281 Muscle weakness (generalized): Secondary | ICD-10-CM | POA: Diagnosis not present

## 2021-10-16 DIAGNOSIS — R339 Retention of urine, unspecified: Secondary | ICD-10-CM | POA: Diagnosis not present

## 2021-10-16 DIAGNOSIS — M8580 Other specified disorders of bone density and structure, unspecified site: Secondary | ICD-10-CM | POA: Diagnosis not present

## 2021-10-16 DIAGNOSIS — K859 Acute pancreatitis without necrosis or infection, unspecified: Secondary | ICD-10-CM | POA: Diagnosis not present

## 2021-10-16 DIAGNOSIS — M19032 Primary osteoarthritis, left wrist: Secondary | ICD-10-CM | POA: Diagnosis not present

## 2021-10-16 DIAGNOSIS — I129 Hypertensive chronic kidney disease with stage 1 through stage 4 chronic kidney disease, or unspecified chronic kidney disease: Secondary | ICD-10-CM | POA: Diagnosis not present

## 2021-10-19 DIAGNOSIS — I82409 Acute embolism and thrombosis of unspecified deep veins of unspecified lower extremity: Secondary | ICD-10-CM | POA: Diagnosis not present

## 2021-10-19 DIAGNOSIS — I4891 Unspecified atrial fibrillation: Secondary | ICD-10-CM | POA: Diagnosis not present

## 2021-10-19 DIAGNOSIS — M6281 Muscle weakness (generalized): Secondary | ICD-10-CM | POA: Diagnosis not present

## 2021-10-19 DIAGNOSIS — M8580 Other specified disorders of bone density and structure, unspecified site: Secondary | ICD-10-CM | POA: Diagnosis not present

## 2021-10-19 DIAGNOSIS — M859 Disorder of bone density and structure, unspecified: Secondary | ICD-10-CM | POA: Diagnosis not present

## 2021-10-19 DIAGNOSIS — R2681 Unsteadiness on feet: Secondary | ICD-10-CM | POA: Diagnosis not present

## 2021-10-19 DIAGNOSIS — K859 Acute pancreatitis without necrosis or infection, unspecified: Secondary | ICD-10-CM | POA: Diagnosis not present

## 2021-10-19 DIAGNOSIS — M19032 Primary osteoarthritis, left wrist: Secondary | ICD-10-CM | POA: Diagnosis not present

## 2021-10-19 DIAGNOSIS — N184 Chronic kidney disease, stage 4 (severe): Secondary | ICD-10-CM | POA: Diagnosis not present

## 2021-10-19 DIAGNOSIS — I129 Hypertensive chronic kidney disease with stage 1 through stage 4 chronic kidney disease, or unspecified chronic kidney disease: Secondary | ICD-10-CM | POA: Diagnosis not present

## 2021-10-19 DIAGNOSIS — F32A Depression, unspecified: Secondary | ICD-10-CM | POA: Diagnosis not present

## 2021-10-19 DIAGNOSIS — I10 Essential (primary) hypertension: Secondary | ICD-10-CM | POA: Diagnosis not present

## 2021-10-20 DIAGNOSIS — M6281 Muscle weakness (generalized): Secondary | ICD-10-CM | POA: Diagnosis not present

## 2021-10-20 DIAGNOSIS — R2681 Unsteadiness on feet: Secondary | ICD-10-CM | POA: Diagnosis not present

## 2021-10-20 DIAGNOSIS — K859 Acute pancreatitis without necrosis or infection, unspecified: Secondary | ICD-10-CM | POA: Diagnosis not present

## 2021-10-20 DIAGNOSIS — M19032 Primary osteoarthritis, left wrist: Secondary | ICD-10-CM | POA: Diagnosis not present

## 2021-10-20 DIAGNOSIS — M859 Disorder of bone density and structure, unspecified: Secondary | ICD-10-CM | POA: Diagnosis not present

## 2021-10-20 DIAGNOSIS — N184 Chronic kidney disease, stage 4 (severe): Secondary | ICD-10-CM | POA: Diagnosis not present

## 2021-10-20 DIAGNOSIS — M8580 Other specified disorders of bone density and structure, unspecified site: Secondary | ICD-10-CM | POA: Diagnosis not present

## 2021-10-20 DIAGNOSIS — I129 Hypertensive chronic kidney disease with stage 1 through stage 4 chronic kidney disease, or unspecified chronic kidney disease: Secondary | ICD-10-CM | POA: Diagnosis not present

## 2021-10-22 DIAGNOSIS — M19032 Primary osteoarthritis, left wrist: Secondary | ICD-10-CM | POA: Diagnosis not present

## 2021-10-22 DIAGNOSIS — M6281 Muscle weakness (generalized): Secondary | ICD-10-CM | POA: Diagnosis not present

## 2021-10-22 DIAGNOSIS — M8580 Other specified disorders of bone density and structure, unspecified site: Secondary | ICD-10-CM | POA: Diagnosis not present

## 2021-10-22 DIAGNOSIS — R2681 Unsteadiness on feet: Secondary | ICD-10-CM | POA: Diagnosis not present

## 2021-10-22 DIAGNOSIS — M859 Disorder of bone density and structure, unspecified: Secondary | ICD-10-CM | POA: Diagnosis not present

## 2021-10-22 DIAGNOSIS — I129 Hypertensive chronic kidney disease with stage 1 through stage 4 chronic kidney disease, or unspecified chronic kidney disease: Secondary | ICD-10-CM | POA: Diagnosis not present

## 2021-10-22 DIAGNOSIS — N184 Chronic kidney disease, stage 4 (severe): Secondary | ICD-10-CM | POA: Diagnosis not present

## 2021-10-22 DIAGNOSIS — K859 Acute pancreatitis without necrosis or infection, unspecified: Secondary | ICD-10-CM | POA: Diagnosis not present

## 2021-10-23 DIAGNOSIS — R2681 Unsteadiness on feet: Secondary | ICD-10-CM | POA: Diagnosis not present

## 2021-10-23 DIAGNOSIS — N184 Chronic kidney disease, stage 4 (severe): Secondary | ICD-10-CM | POA: Diagnosis not present

## 2021-10-23 DIAGNOSIS — M6281 Muscle weakness (generalized): Secondary | ICD-10-CM | POA: Diagnosis not present

## 2021-10-23 DIAGNOSIS — K859 Acute pancreatitis without necrosis or infection, unspecified: Secondary | ICD-10-CM | POA: Diagnosis not present

## 2021-10-23 DIAGNOSIS — M859 Disorder of bone density and structure, unspecified: Secondary | ICD-10-CM | POA: Diagnosis not present

## 2021-10-23 DIAGNOSIS — M19032 Primary osteoarthritis, left wrist: Secondary | ICD-10-CM | POA: Diagnosis not present

## 2021-10-23 DIAGNOSIS — M8580 Other specified disorders of bone density and structure, unspecified site: Secondary | ICD-10-CM | POA: Diagnosis not present

## 2021-10-23 DIAGNOSIS — I129 Hypertensive chronic kidney disease with stage 1 through stage 4 chronic kidney disease, or unspecified chronic kidney disease: Secondary | ICD-10-CM | POA: Diagnosis not present

## 2021-10-26 DIAGNOSIS — K859 Acute pancreatitis without necrosis or infection, unspecified: Secondary | ICD-10-CM | POA: Diagnosis not present

## 2021-10-26 DIAGNOSIS — N184 Chronic kidney disease, stage 4 (severe): Secondary | ICD-10-CM | POA: Diagnosis not present

## 2021-10-26 DIAGNOSIS — I129 Hypertensive chronic kidney disease with stage 1 through stage 4 chronic kidney disease, or unspecified chronic kidney disease: Secondary | ICD-10-CM | POA: Diagnosis not present

## 2021-10-26 DIAGNOSIS — M6281 Muscle weakness (generalized): Secondary | ICD-10-CM | POA: Diagnosis not present

## 2021-10-26 DIAGNOSIS — M19032 Primary osteoarthritis, left wrist: Secondary | ICD-10-CM | POA: Diagnosis not present

## 2021-10-26 DIAGNOSIS — M8580 Other specified disorders of bone density and structure, unspecified site: Secondary | ICD-10-CM | POA: Diagnosis not present

## 2021-10-26 DIAGNOSIS — M859 Disorder of bone density and structure, unspecified: Secondary | ICD-10-CM | POA: Diagnosis not present

## 2021-10-26 DIAGNOSIS — R2681 Unsteadiness on feet: Secondary | ICD-10-CM | POA: Diagnosis not present

## 2021-10-27 DIAGNOSIS — M8580 Other specified disorders of bone density and structure, unspecified site: Secondary | ICD-10-CM | POA: Diagnosis not present

## 2021-10-27 DIAGNOSIS — M859 Disorder of bone density and structure, unspecified: Secondary | ICD-10-CM | POA: Diagnosis not present

## 2021-10-27 DIAGNOSIS — I129 Hypertensive chronic kidney disease with stage 1 through stage 4 chronic kidney disease, or unspecified chronic kidney disease: Secondary | ICD-10-CM | POA: Diagnosis not present

## 2021-10-27 DIAGNOSIS — M19032 Primary osteoarthritis, left wrist: Secondary | ICD-10-CM | POA: Diagnosis not present

## 2021-10-27 DIAGNOSIS — N184 Chronic kidney disease, stage 4 (severe): Secondary | ICD-10-CM | POA: Diagnosis not present

## 2021-10-27 DIAGNOSIS — K859 Acute pancreatitis without necrosis or infection, unspecified: Secondary | ICD-10-CM | POA: Diagnosis not present

## 2021-10-27 DIAGNOSIS — R2681 Unsteadiness on feet: Secondary | ICD-10-CM | POA: Diagnosis not present

## 2021-10-27 DIAGNOSIS — M6281 Muscle weakness (generalized): Secondary | ICD-10-CM | POA: Diagnosis not present

## 2021-10-28 DIAGNOSIS — R5381 Other malaise: Secondary | ICD-10-CM | POA: Diagnosis not present

## 2021-10-28 DIAGNOSIS — N189 Chronic kidney disease, unspecified: Secondary | ICD-10-CM | POA: Diagnosis not present

## 2021-10-28 DIAGNOSIS — R2232 Localized swelling, mass and lump, left upper limb: Secondary | ICD-10-CM | POA: Diagnosis not present

## 2021-10-28 DIAGNOSIS — K859 Acute pancreatitis without necrosis or infection, unspecified: Secondary | ICD-10-CM | POA: Diagnosis not present

## 2021-10-28 DIAGNOSIS — M6281 Muscle weakness (generalized): Secondary | ICD-10-CM | POA: Diagnosis not present

## 2021-10-28 DIAGNOSIS — I129 Hypertensive chronic kidney disease with stage 1 through stage 4 chronic kidney disease, or unspecified chronic kidney disease: Secondary | ICD-10-CM | POA: Diagnosis not present

## 2021-10-28 DIAGNOSIS — N184 Chronic kidney disease, stage 4 (severe): Secondary | ICD-10-CM | POA: Diagnosis not present

## 2021-10-28 DIAGNOSIS — I10 Essential (primary) hypertension: Secondary | ICD-10-CM | POA: Diagnosis not present

## 2021-10-28 DIAGNOSIS — M859 Disorder of bone density and structure, unspecified: Secondary | ICD-10-CM | POA: Diagnosis not present

## 2021-10-28 DIAGNOSIS — I82409 Acute embolism and thrombosis of unspecified deep veins of unspecified lower extremity: Secondary | ICD-10-CM | POA: Diagnosis not present

## 2021-10-28 DIAGNOSIS — G894 Chronic pain syndrome: Secondary | ICD-10-CM | POA: Diagnosis not present

## 2021-10-28 DIAGNOSIS — R1310 Dysphagia, unspecified: Secondary | ICD-10-CM | POA: Diagnosis not present

## 2021-10-28 DIAGNOSIS — M8580 Other specified disorders of bone density and structure, unspecified site: Secondary | ICD-10-CM | POA: Diagnosis not present

## 2021-10-28 DIAGNOSIS — R600 Localized edema: Secondary | ICD-10-CM | POA: Diagnosis not present

## 2021-10-28 DIAGNOSIS — M19032 Primary osteoarthritis, left wrist: Secondary | ICD-10-CM | POA: Diagnosis not present

## 2021-10-29 DIAGNOSIS — M19032 Primary osteoarthritis, left wrist: Secondary | ICD-10-CM | POA: Diagnosis not present

## 2021-10-29 DIAGNOSIS — I129 Hypertensive chronic kidney disease with stage 1 through stage 4 chronic kidney disease, or unspecified chronic kidney disease: Secondary | ICD-10-CM | POA: Diagnosis not present

## 2021-10-29 DIAGNOSIS — R1310 Dysphagia, unspecified: Secondary | ICD-10-CM | POA: Diagnosis not present

## 2021-10-29 DIAGNOSIS — I10 Essential (primary) hypertension: Secondary | ICD-10-CM | POA: Diagnosis not present

## 2021-10-29 DIAGNOSIS — G894 Chronic pain syndrome: Secondary | ICD-10-CM | POA: Diagnosis not present

## 2021-10-29 DIAGNOSIS — M8580 Other specified disorders of bone density and structure, unspecified site: Secondary | ICD-10-CM | POA: Diagnosis not present

## 2021-10-29 DIAGNOSIS — N184 Chronic kidney disease, stage 4 (severe): Secondary | ICD-10-CM | POA: Diagnosis not present

## 2021-10-29 DIAGNOSIS — N189 Chronic kidney disease, unspecified: Secondary | ICD-10-CM | POA: Diagnosis not present

## 2021-10-29 DIAGNOSIS — R5381 Other malaise: Secondary | ICD-10-CM | POA: Diagnosis not present

## 2021-10-29 DIAGNOSIS — M859 Disorder of bone density and structure, unspecified: Secondary | ICD-10-CM | POA: Diagnosis not present

## 2021-10-29 DIAGNOSIS — R2232 Localized swelling, mass and lump, left upper limb: Secondary | ICD-10-CM | POA: Diagnosis not present

## 2021-10-29 DIAGNOSIS — M6281 Muscle weakness (generalized): Secondary | ICD-10-CM | POA: Diagnosis not present

## 2021-10-30 DIAGNOSIS — G894 Chronic pain syndrome: Secondary | ICD-10-CM | POA: Diagnosis not present

## 2021-10-30 DIAGNOSIS — R2232 Localized swelling, mass and lump, left upper limb: Secondary | ICD-10-CM | POA: Diagnosis not present

## 2021-10-30 DIAGNOSIS — M6281 Muscle weakness (generalized): Secondary | ICD-10-CM | POA: Diagnosis not present

## 2021-10-30 DIAGNOSIS — M8580 Other specified disorders of bone density and structure, unspecified site: Secondary | ICD-10-CM | POA: Diagnosis not present

## 2021-10-30 DIAGNOSIS — M859 Disorder of bone density and structure, unspecified: Secondary | ICD-10-CM | POA: Diagnosis not present

## 2021-10-30 DIAGNOSIS — M19032 Primary osteoarthritis, left wrist: Secondary | ICD-10-CM | POA: Diagnosis not present

## 2021-10-30 DIAGNOSIS — N189 Chronic kidney disease, unspecified: Secondary | ICD-10-CM | POA: Diagnosis not present

## 2021-10-30 DIAGNOSIS — I10 Essential (primary) hypertension: Secondary | ICD-10-CM | POA: Diagnosis not present

## 2021-10-30 DIAGNOSIS — I129 Hypertensive chronic kidney disease with stage 1 through stage 4 chronic kidney disease, or unspecified chronic kidney disease: Secondary | ICD-10-CM | POA: Diagnosis not present

## 2021-10-30 DIAGNOSIS — R5381 Other malaise: Secondary | ICD-10-CM | POA: Diagnosis not present

## 2021-10-30 DIAGNOSIS — N184 Chronic kidney disease, stage 4 (severe): Secondary | ICD-10-CM | POA: Diagnosis not present

## 2021-10-30 DIAGNOSIS — R1310 Dysphagia, unspecified: Secondary | ICD-10-CM | POA: Diagnosis not present

## 2021-10-31 DIAGNOSIS — R5381 Other malaise: Secondary | ICD-10-CM | POA: Diagnosis not present

## 2021-10-31 DIAGNOSIS — I10 Essential (primary) hypertension: Secondary | ICD-10-CM | POA: Diagnosis not present

## 2021-10-31 DIAGNOSIS — I129 Hypertensive chronic kidney disease with stage 1 through stage 4 chronic kidney disease, or unspecified chronic kidney disease: Secondary | ICD-10-CM | POA: Diagnosis not present

## 2021-10-31 DIAGNOSIS — N184 Chronic kidney disease, stage 4 (severe): Secondary | ICD-10-CM | POA: Diagnosis not present

## 2021-10-31 DIAGNOSIS — R2232 Localized swelling, mass and lump, left upper limb: Secondary | ICD-10-CM | POA: Diagnosis not present

## 2021-10-31 DIAGNOSIS — M8580 Other specified disorders of bone density and structure, unspecified site: Secondary | ICD-10-CM | POA: Diagnosis not present

## 2021-10-31 DIAGNOSIS — R1310 Dysphagia, unspecified: Secondary | ICD-10-CM | POA: Diagnosis not present

## 2021-10-31 DIAGNOSIS — G894 Chronic pain syndrome: Secondary | ICD-10-CM | POA: Diagnosis not present

## 2021-10-31 DIAGNOSIS — M859 Disorder of bone density and structure, unspecified: Secondary | ICD-10-CM | POA: Diagnosis not present

## 2021-10-31 DIAGNOSIS — N189 Chronic kidney disease, unspecified: Secondary | ICD-10-CM | POA: Diagnosis not present

## 2021-10-31 DIAGNOSIS — M19032 Primary osteoarthritis, left wrist: Secondary | ICD-10-CM | POA: Diagnosis not present

## 2021-10-31 DIAGNOSIS — M6281 Muscle weakness (generalized): Secondary | ICD-10-CM | POA: Diagnosis not present

## 2021-11-02 DIAGNOSIS — I129 Hypertensive chronic kidney disease with stage 1 through stage 4 chronic kidney disease, or unspecified chronic kidney disease: Secondary | ICD-10-CM | POA: Diagnosis not present

## 2021-11-02 DIAGNOSIS — N189 Chronic kidney disease, unspecified: Secondary | ICD-10-CM | POA: Diagnosis not present

## 2021-11-02 DIAGNOSIS — R1310 Dysphagia, unspecified: Secondary | ICD-10-CM | POA: Diagnosis not present

## 2021-11-02 DIAGNOSIS — G894 Chronic pain syndrome: Secondary | ICD-10-CM | POA: Diagnosis not present

## 2021-11-02 DIAGNOSIS — M19032 Primary osteoarthritis, left wrist: Secondary | ICD-10-CM | POA: Diagnosis not present

## 2021-11-02 DIAGNOSIS — I10 Essential (primary) hypertension: Secondary | ICD-10-CM | POA: Diagnosis not present

## 2021-11-02 DIAGNOSIS — M6281 Muscle weakness (generalized): Secondary | ICD-10-CM | POA: Diagnosis not present

## 2021-11-02 DIAGNOSIS — R2232 Localized swelling, mass and lump, left upper limb: Secondary | ICD-10-CM | POA: Diagnosis not present

## 2021-11-02 DIAGNOSIS — R5381 Other malaise: Secondary | ICD-10-CM | POA: Diagnosis not present

## 2021-11-02 DIAGNOSIS — M8580 Other specified disorders of bone density and structure, unspecified site: Secondary | ICD-10-CM | POA: Diagnosis not present

## 2021-11-02 DIAGNOSIS — M859 Disorder of bone density and structure, unspecified: Secondary | ICD-10-CM | POA: Diagnosis not present

## 2021-11-02 DIAGNOSIS — N184 Chronic kidney disease, stage 4 (severe): Secondary | ICD-10-CM | POA: Diagnosis not present

## 2021-11-03 DIAGNOSIS — N189 Chronic kidney disease, unspecified: Secondary | ICD-10-CM | POA: Diagnosis not present

## 2021-11-03 DIAGNOSIS — M19032 Primary osteoarthritis, left wrist: Secondary | ICD-10-CM | POA: Diagnosis not present

## 2021-11-03 DIAGNOSIS — I10 Essential (primary) hypertension: Secondary | ICD-10-CM | POA: Diagnosis not present

## 2021-11-03 DIAGNOSIS — R1310 Dysphagia, unspecified: Secondary | ICD-10-CM | POA: Diagnosis not present

## 2021-11-03 DIAGNOSIS — N184 Chronic kidney disease, stage 4 (severe): Secondary | ICD-10-CM | POA: Diagnosis not present

## 2021-11-03 DIAGNOSIS — R5381 Other malaise: Secondary | ICD-10-CM | POA: Diagnosis not present

## 2021-11-03 DIAGNOSIS — M6281 Muscle weakness (generalized): Secondary | ICD-10-CM | POA: Diagnosis not present

## 2021-11-03 DIAGNOSIS — G894 Chronic pain syndrome: Secondary | ICD-10-CM | POA: Diagnosis not present

## 2021-11-03 DIAGNOSIS — R2232 Localized swelling, mass and lump, left upper limb: Secondary | ICD-10-CM | POA: Diagnosis not present

## 2021-11-03 DIAGNOSIS — I129 Hypertensive chronic kidney disease with stage 1 through stage 4 chronic kidney disease, or unspecified chronic kidney disease: Secondary | ICD-10-CM | POA: Diagnosis not present

## 2021-11-03 DIAGNOSIS — M8580 Other specified disorders of bone density and structure, unspecified site: Secondary | ICD-10-CM | POA: Diagnosis not present

## 2021-11-03 DIAGNOSIS — M859 Disorder of bone density and structure, unspecified: Secondary | ICD-10-CM | POA: Diagnosis not present

## 2021-11-04 DIAGNOSIS — M19032 Primary osteoarthritis, left wrist: Secondary | ICD-10-CM | POA: Diagnosis not present

## 2021-11-04 DIAGNOSIS — R5381 Other malaise: Secondary | ICD-10-CM | POA: Diagnosis not present

## 2021-11-04 DIAGNOSIS — I129 Hypertensive chronic kidney disease with stage 1 through stage 4 chronic kidney disease, or unspecified chronic kidney disease: Secondary | ICD-10-CM | POA: Diagnosis not present

## 2021-11-04 DIAGNOSIS — N189 Chronic kidney disease, unspecified: Secondary | ICD-10-CM | POA: Diagnosis not present

## 2021-11-04 DIAGNOSIS — M6281 Muscle weakness (generalized): Secondary | ICD-10-CM | POA: Diagnosis not present

## 2021-11-04 DIAGNOSIS — Z8614 Personal history of Methicillin resistant Staphylococcus aureus infection: Secondary | ICD-10-CM | POA: Diagnosis not present

## 2021-11-04 DIAGNOSIS — G894 Chronic pain syndrome: Secondary | ICD-10-CM | POA: Diagnosis not present

## 2021-11-04 DIAGNOSIS — M8580 Other specified disorders of bone density and structure, unspecified site: Secondary | ICD-10-CM | POA: Diagnosis not present

## 2021-11-04 DIAGNOSIS — I10 Essential (primary) hypertension: Secondary | ICD-10-CM | POA: Diagnosis not present

## 2021-11-04 DIAGNOSIS — R2232 Localized swelling, mass and lump, left upper limb: Secondary | ICD-10-CM | POA: Diagnosis not present

## 2021-11-04 DIAGNOSIS — M859 Disorder of bone density and structure, unspecified: Secondary | ICD-10-CM | POA: Diagnosis not present

## 2021-11-04 DIAGNOSIS — N184 Chronic kidney disease, stage 4 (severe): Secondary | ICD-10-CM | POA: Diagnosis not present

## 2021-11-04 DIAGNOSIS — R1310 Dysphagia, unspecified: Secondary | ICD-10-CM | POA: Diagnosis not present

## 2021-11-05 ENCOUNTER — Other Ambulatory Visit: Payer: Self-pay | Admitting: Family Medicine

## 2021-11-05 DIAGNOSIS — N189 Chronic kidney disease, unspecified: Secondary | ICD-10-CM | POA: Diagnosis not present

## 2021-11-05 DIAGNOSIS — R5381 Other malaise: Secondary | ICD-10-CM | POA: Diagnosis not present

## 2021-11-05 DIAGNOSIS — M8580 Other specified disorders of bone density and structure, unspecified site: Secondary | ICD-10-CM | POA: Diagnosis not present

## 2021-11-05 DIAGNOSIS — M6281 Muscle weakness (generalized): Secondary | ICD-10-CM | POA: Diagnosis not present

## 2021-11-05 DIAGNOSIS — I10 Essential (primary) hypertension: Secondary | ICD-10-CM | POA: Diagnosis not present

## 2021-11-05 DIAGNOSIS — G894 Chronic pain syndrome: Secondary | ICD-10-CM | POA: Diagnosis not present

## 2021-11-05 DIAGNOSIS — R1310 Dysphagia, unspecified: Secondary | ICD-10-CM | POA: Diagnosis not present

## 2021-11-05 DIAGNOSIS — N184 Chronic kidney disease, stage 4 (severe): Secondary | ICD-10-CM | POA: Diagnosis not present

## 2021-11-05 DIAGNOSIS — R2232 Localized swelling, mass and lump, left upper limb: Secondary | ICD-10-CM | POA: Diagnosis not present

## 2021-11-05 DIAGNOSIS — M19032 Primary osteoarthritis, left wrist: Secondary | ICD-10-CM | POA: Diagnosis not present

## 2021-11-05 DIAGNOSIS — M859 Disorder of bone density and structure, unspecified: Secondary | ICD-10-CM | POA: Diagnosis not present

## 2021-11-05 DIAGNOSIS — I129 Hypertensive chronic kidney disease with stage 1 through stage 4 chronic kidney disease, or unspecified chronic kidney disease: Secondary | ICD-10-CM | POA: Diagnosis not present

## 2021-11-07 DIAGNOSIS — I10 Essential (primary) hypertension: Secondary | ICD-10-CM | POA: Diagnosis not present

## 2021-11-07 DIAGNOSIS — M19032 Primary osteoarthritis, left wrist: Secondary | ICD-10-CM | POA: Diagnosis not present

## 2021-11-07 DIAGNOSIS — N189 Chronic kidney disease, unspecified: Secondary | ICD-10-CM | POA: Diagnosis not present

## 2021-11-07 DIAGNOSIS — M8580 Other specified disorders of bone density and structure, unspecified site: Secondary | ICD-10-CM | POA: Diagnosis not present

## 2021-11-07 DIAGNOSIS — N184 Chronic kidney disease, stage 4 (severe): Secondary | ICD-10-CM | POA: Diagnosis not present

## 2021-11-07 DIAGNOSIS — R5381 Other malaise: Secondary | ICD-10-CM | POA: Diagnosis not present

## 2021-11-07 DIAGNOSIS — I129 Hypertensive chronic kidney disease with stage 1 through stage 4 chronic kidney disease, or unspecified chronic kidney disease: Secondary | ICD-10-CM | POA: Diagnosis not present

## 2021-11-07 DIAGNOSIS — R2232 Localized swelling, mass and lump, left upper limb: Secondary | ICD-10-CM | POA: Diagnosis not present

## 2021-11-07 DIAGNOSIS — M859 Disorder of bone density and structure, unspecified: Secondary | ICD-10-CM | POA: Diagnosis not present

## 2021-11-07 DIAGNOSIS — R1310 Dysphagia, unspecified: Secondary | ICD-10-CM | POA: Diagnosis not present

## 2021-11-07 DIAGNOSIS — M6281 Muscle weakness (generalized): Secondary | ICD-10-CM | POA: Diagnosis not present

## 2021-11-07 DIAGNOSIS — G894 Chronic pain syndrome: Secondary | ICD-10-CM | POA: Diagnosis not present

## 2021-11-09 DIAGNOSIS — M8580 Other specified disorders of bone density and structure, unspecified site: Secondary | ICD-10-CM | POA: Diagnosis not present

## 2021-11-09 DIAGNOSIS — I129 Hypertensive chronic kidney disease with stage 1 through stage 4 chronic kidney disease, or unspecified chronic kidney disease: Secondary | ICD-10-CM | POA: Diagnosis not present

## 2021-11-09 DIAGNOSIS — N189 Chronic kidney disease, unspecified: Secondary | ICD-10-CM | POA: Diagnosis not present

## 2021-11-09 DIAGNOSIS — R1310 Dysphagia, unspecified: Secondary | ICD-10-CM | POA: Diagnosis not present

## 2021-11-09 DIAGNOSIS — I10 Essential (primary) hypertension: Secondary | ICD-10-CM | POA: Diagnosis not present

## 2021-11-09 DIAGNOSIS — R2232 Localized swelling, mass and lump, left upper limb: Secondary | ICD-10-CM | POA: Diagnosis not present

## 2021-11-09 DIAGNOSIS — M19032 Primary osteoarthritis, left wrist: Secondary | ICD-10-CM | POA: Diagnosis not present

## 2021-11-09 DIAGNOSIS — R5381 Other malaise: Secondary | ICD-10-CM | POA: Diagnosis not present

## 2021-11-09 DIAGNOSIS — N184 Chronic kidney disease, stage 4 (severe): Secondary | ICD-10-CM | POA: Diagnosis not present

## 2021-11-09 DIAGNOSIS — M859 Disorder of bone density and structure, unspecified: Secondary | ICD-10-CM | POA: Diagnosis not present

## 2021-11-09 DIAGNOSIS — G894 Chronic pain syndrome: Secondary | ICD-10-CM | POA: Diagnosis not present

## 2021-11-09 DIAGNOSIS — M6281 Muscle weakness (generalized): Secondary | ICD-10-CM | POA: Diagnosis not present

## 2021-11-10 DIAGNOSIS — G8929 Other chronic pain: Secondary | ICD-10-CM | POA: Diagnosis not present

## 2021-11-10 DIAGNOSIS — G894 Chronic pain syndrome: Secondary | ICD-10-CM | POA: Diagnosis not present

## 2021-11-10 DIAGNOSIS — I10 Essential (primary) hypertension: Secondary | ICD-10-CM | POA: Diagnosis not present

## 2021-11-10 DIAGNOSIS — M8580 Other specified disorders of bone density and structure, unspecified site: Secondary | ICD-10-CM | POA: Diagnosis not present

## 2021-11-10 DIAGNOSIS — I129 Hypertensive chronic kidney disease with stage 1 through stage 4 chronic kidney disease, or unspecified chronic kidney disease: Secondary | ICD-10-CM | POA: Diagnosis not present

## 2021-11-10 DIAGNOSIS — M19032 Primary osteoarthritis, left wrist: Secondary | ICD-10-CM | POA: Diagnosis not present

## 2021-11-10 DIAGNOSIS — M6281 Muscle weakness (generalized): Secondary | ICD-10-CM | POA: Diagnosis not present

## 2021-11-10 DIAGNOSIS — N189 Chronic kidney disease, unspecified: Secondary | ICD-10-CM | POA: Diagnosis not present

## 2021-11-10 DIAGNOSIS — M549 Dorsalgia, unspecified: Secondary | ICD-10-CM | POA: Diagnosis not present

## 2021-11-10 DIAGNOSIS — R2232 Localized swelling, mass and lump, left upper limb: Secondary | ICD-10-CM | POA: Diagnosis not present

## 2021-11-10 DIAGNOSIS — M436 Torticollis: Secondary | ICD-10-CM | POA: Diagnosis not present

## 2021-11-10 DIAGNOSIS — M859 Disorder of bone density and structure, unspecified: Secondary | ICD-10-CM | POA: Diagnosis not present

## 2021-11-10 DIAGNOSIS — R5381 Other malaise: Secondary | ICD-10-CM | POA: Diagnosis not present

## 2021-11-10 DIAGNOSIS — R1310 Dysphagia, unspecified: Secondary | ICD-10-CM | POA: Diagnosis not present

## 2021-11-10 DIAGNOSIS — N184 Chronic kidney disease, stage 4 (severe): Secondary | ICD-10-CM | POA: Diagnosis not present

## 2021-11-11 DIAGNOSIS — M6281 Muscle weakness (generalized): Secondary | ICD-10-CM | POA: Diagnosis not present

## 2021-11-11 DIAGNOSIS — Z96659 Presence of unspecified artificial knee joint: Secondary | ICD-10-CM | POA: Diagnosis not present

## 2021-11-11 DIAGNOSIS — I129 Hypertensive chronic kidney disease with stage 1 through stage 4 chronic kidney disease, or unspecified chronic kidney disease: Secondary | ICD-10-CM | POA: Diagnosis not present

## 2021-11-11 DIAGNOSIS — T8459XD Infection and inflammatory reaction due to other internal joint prosthesis, subsequent encounter: Secondary | ICD-10-CM | POA: Diagnosis not present

## 2021-11-11 DIAGNOSIS — R1310 Dysphagia, unspecified: Secondary | ICD-10-CM | POA: Diagnosis not present

## 2021-11-11 DIAGNOSIS — R5381 Other malaise: Secondary | ICD-10-CM | POA: Diagnosis not present

## 2021-11-11 DIAGNOSIS — M859 Disorder of bone density and structure, unspecified: Secondary | ICD-10-CM | POA: Diagnosis not present

## 2021-11-11 DIAGNOSIS — M8580 Other specified disorders of bone density and structure, unspecified site: Secondary | ICD-10-CM | POA: Diagnosis not present

## 2021-11-11 DIAGNOSIS — I10 Essential (primary) hypertension: Secondary | ICD-10-CM | POA: Diagnosis not present

## 2021-11-11 DIAGNOSIS — I82592 Chronic embolism and thrombosis of other specified deep vein of left lower extremity: Secondary | ICD-10-CM | POA: Diagnosis not present

## 2021-11-11 DIAGNOSIS — G894 Chronic pain syndrome: Secondary | ICD-10-CM | POA: Diagnosis not present

## 2021-11-11 DIAGNOSIS — N184 Chronic kidney disease, stage 4 (severe): Secondary | ICD-10-CM | POA: Diagnosis not present

## 2021-11-11 DIAGNOSIS — N189 Chronic kidney disease, unspecified: Secondary | ICD-10-CM | POA: Diagnosis not present

## 2021-11-11 DIAGNOSIS — M19032 Primary osteoarthritis, left wrist: Secondary | ICD-10-CM | POA: Diagnosis not present

## 2021-11-11 DIAGNOSIS — R2232 Localized swelling, mass and lump, left upper limb: Secondary | ICD-10-CM | POA: Diagnosis not present

## 2021-11-11 DIAGNOSIS — B950 Streptococcus, group A, as the cause of diseases classified elsewhere: Secondary | ICD-10-CM | POA: Diagnosis not present

## 2021-11-11 DIAGNOSIS — Z881 Allergy status to other antibiotic agents status: Secondary | ICD-10-CM | POA: Diagnosis not present

## 2021-11-11 DIAGNOSIS — R197 Diarrhea, unspecified: Secondary | ICD-10-CM | POA: Diagnosis not present

## 2021-11-11 DIAGNOSIS — D803 Selective deficiency of immunoglobulin G [IgG] subclasses: Secondary | ICD-10-CM | POA: Diagnosis not present

## 2021-11-11 DIAGNOSIS — B957 Other staphylococcus as the cause of diseases classified elsewhere: Secondary | ICD-10-CM | POA: Diagnosis not present

## 2021-11-12 DIAGNOSIS — M6281 Muscle weakness (generalized): Secondary | ICD-10-CM | POA: Diagnosis not present

## 2021-11-12 DIAGNOSIS — R1310 Dysphagia, unspecified: Secondary | ICD-10-CM | POA: Diagnosis not present

## 2021-11-12 DIAGNOSIS — I10 Essential (primary) hypertension: Secondary | ICD-10-CM | POA: Diagnosis not present

## 2021-11-12 DIAGNOSIS — R5381 Other malaise: Secondary | ICD-10-CM | POA: Diagnosis not present

## 2021-11-12 DIAGNOSIS — N189 Chronic kidney disease, unspecified: Secondary | ICD-10-CM | POA: Diagnosis not present

## 2021-11-12 DIAGNOSIS — M859 Disorder of bone density and structure, unspecified: Secondary | ICD-10-CM | POA: Diagnosis not present

## 2021-11-12 DIAGNOSIS — G894 Chronic pain syndrome: Secondary | ICD-10-CM | POA: Diagnosis not present

## 2021-11-12 DIAGNOSIS — I129 Hypertensive chronic kidney disease with stage 1 through stage 4 chronic kidney disease, or unspecified chronic kidney disease: Secondary | ICD-10-CM | POA: Diagnosis not present

## 2021-11-12 DIAGNOSIS — N184 Chronic kidney disease, stage 4 (severe): Secondary | ICD-10-CM | POA: Diagnosis not present

## 2021-11-12 DIAGNOSIS — R2232 Localized swelling, mass and lump, left upper limb: Secondary | ICD-10-CM | POA: Diagnosis not present

## 2021-11-12 DIAGNOSIS — M8580 Other specified disorders of bone density and structure, unspecified site: Secondary | ICD-10-CM | POA: Diagnosis not present

## 2021-11-12 DIAGNOSIS — M19032 Primary osteoarthritis, left wrist: Secondary | ICD-10-CM | POA: Diagnosis not present

## 2021-11-13 DIAGNOSIS — M8580 Other specified disorders of bone density and structure, unspecified site: Secondary | ICD-10-CM | POA: Diagnosis not present

## 2021-11-13 DIAGNOSIS — I10 Essential (primary) hypertension: Secondary | ICD-10-CM | POA: Diagnosis not present

## 2021-11-13 DIAGNOSIS — R1310 Dysphagia, unspecified: Secondary | ICD-10-CM | POA: Diagnosis not present

## 2021-11-13 DIAGNOSIS — M6281 Muscle weakness (generalized): Secondary | ICD-10-CM | POA: Diagnosis not present

## 2021-11-13 DIAGNOSIS — R2232 Localized swelling, mass and lump, left upper limb: Secondary | ICD-10-CM | POA: Diagnosis not present

## 2021-11-13 DIAGNOSIS — N184 Chronic kidney disease, stage 4 (severe): Secondary | ICD-10-CM | POA: Diagnosis not present

## 2021-11-13 DIAGNOSIS — M19032 Primary osteoarthritis, left wrist: Secondary | ICD-10-CM | POA: Diagnosis not present

## 2021-11-13 DIAGNOSIS — N189 Chronic kidney disease, unspecified: Secondary | ICD-10-CM | POA: Diagnosis not present

## 2021-11-13 DIAGNOSIS — M859 Disorder of bone density and structure, unspecified: Secondary | ICD-10-CM | POA: Diagnosis not present

## 2021-11-13 DIAGNOSIS — G894 Chronic pain syndrome: Secondary | ICD-10-CM | POA: Diagnosis not present

## 2021-11-13 DIAGNOSIS — R5381 Other malaise: Secondary | ICD-10-CM | POA: Diagnosis not present

## 2021-11-13 DIAGNOSIS — I129 Hypertensive chronic kidney disease with stage 1 through stage 4 chronic kidney disease, or unspecified chronic kidney disease: Secondary | ICD-10-CM | POA: Diagnosis not present

## 2021-11-16 DIAGNOSIS — M19032 Primary osteoarthritis, left wrist: Secondary | ICD-10-CM | POA: Diagnosis not present

## 2021-11-16 DIAGNOSIS — N184 Chronic kidney disease, stage 4 (severe): Secondary | ICD-10-CM | POA: Diagnosis not present

## 2021-11-16 DIAGNOSIS — I10 Essential (primary) hypertension: Secondary | ICD-10-CM | POA: Diagnosis not present

## 2021-11-16 DIAGNOSIS — R1310 Dysphagia, unspecified: Secondary | ICD-10-CM | POA: Diagnosis not present

## 2021-11-16 DIAGNOSIS — R2232 Localized swelling, mass and lump, left upper limb: Secondary | ICD-10-CM | POA: Diagnosis not present

## 2021-11-16 DIAGNOSIS — I129 Hypertensive chronic kidney disease with stage 1 through stage 4 chronic kidney disease, or unspecified chronic kidney disease: Secondary | ICD-10-CM | POA: Diagnosis not present

## 2021-11-16 DIAGNOSIS — M8580 Other specified disorders of bone density and structure, unspecified site: Secondary | ICD-10-CM | POA: Diagnosis not present

## 2021-11-16 DIAGNOSIS — N189 Chronic kidney disease, unspecified: Secondary | ICD-10-CM | POA: Diagnosis not present

## 2021-11-16 DIAGNOSIS — M859 Disorder of bone density and structure, unspecified: Secondary | ICD-10-CM | POA: Diagnosis not present

## 2021-11-16 DIAGNOSIS — R5381 Other malaise: Secondary | ICD-10-CM | POA: Diagnosis not present

## 2021-11-16 DIAGNOSIS — M6281 Muscle weakness (generalized): Secondary | ICD-10-CM | POA: Diagnosis not present

## 2021-11-16 DIAGNOSIS — G894 Chronic pain syndrome: Secondary | ICD-10-CM | POA: Diagnosis not present

## 2021-11-17 DIAGNOSIS — M19032 Primary osteoarthritis, left wrist: Secondary | ICD-10-CM | POA: Diagnosis not present

## 2021-11-17 DIAGNOSIS — I129 Hypertensive chronic kidney disease with stage 1 through stage 4 chronic kidney disease, or unspecified chronic kidney disease: Secondary | ICD-10-CM | POA: Diagnosis not present

## 2021-11-17 DIAGNOSIS — M859 Disorder of bone density and structure, unspecified: Secondary | ICD-10-CM | POA: Diagnosis not present

## 2021-11-17 DIAGNOSIS — R2232 Localized swelling, mass and lump, left upper limb: Secondary | ICD-10-CM | POA: Diagnosis not present

## 2021-11-17 DIAGNOSIS — M6281 Muscle weakness (generalized): Secondary | ICD-10-CM | POA: Diagnosis not present

## 2021-11-17 DIAGNOSIS — G894 Chronic pain syndrome: Secondary | ICD-10-CM | POA: Diagnosis not present

## 2021-11-17 DIAGNOSIS — R1310 Dysphagia, unspecified: Secondary | ICD-10-CM | POA: Diagnosis not present

## 2021-11-17 DIAGNOSIS — M8580 Other specified disorders of bone density and structure, unspecified site: Secondary | ICD-10-CM | POA: Diagnosis not present

## 2021-11-17 DIAGNOSIS — R5381 Other malaise: Secondary | ICD-10-CM | POA: Diagnosis not present

## 2021-11-17 DIAGNOSIS — N189 Chronic kidney disease, unspecified: Secondary | ICD-10-CM | POA: Diagnosis not present

## 2021-11-17 DIAGNOSIS — N184 Chronic kidney disease, stage 4 (severe): Secondary | ICD-10-CM | POA: Diagnosis not present

## 2021-11-17 DIAGNOSIS — I10 Essential (primary) hypertension: Secondary | ICD-10-CM | POA: Diagnosis not present

## 2021-11-17 DIAGNOSIS — I4891 Unspecified atrial fibrillation: Secondary | ICD-10-CM | POA: Diagnosis not present

## 2021-11-18 DIAGNOSIS — G894 Chronic pain syndrome: Secondary | ICD-10-CM | POA: Diagnosis not present

## 2021-11-18 DIAGNOSIS — M19032 Primary osteoarthritis, left wrist: Secondary | ICD-10-CM | POA: Diagnosis not present

## 2021-11-18 DIAGNOSIS — N189 Chronic kidney disease, unspecified: Secondary | ICD-10-CM | POA: Diagnosis not present

## 2021-11-18 DIAGNOSIS — M6281 Muscle weakness (generalized): Secondary | ICD-10-CM | POA: Diagnosis not present

## 2021-11-18 DIAGNOSIS — R2232 Localized swelling, mass and lump, left upper limb: Secondary | ICD-10-CM | POA: Diagnosis not present

## 2021-11-18 DIAGNOSIS — R1310 Dysphagia, unspecified: Secondary | ICD-10-CM | POA: Diagnosis not present

## 2021-11-18 DIAGNOSIS — M859 Disorder of bone density and structure, unspecified: Secondary | ICD-10-CM | POA: Diagnosis not present

## 2021-11-18 DIAGNOSIS — I129 Hypertensive chronic kidney disease with stage 1 through stage 4 chronic kidney disease, or unspecified chronic kidney disease: Secondary | ICD-10-CM | POA: Diagnosis not present

## 2021-11-18 DIAGNOSIS — N184 Chronic kidney disease, stage 4 (severe): Secondary | ICD-10-CM | POA: Diagnosis not present

## 2021-11-18 DIAGNOSIS — M8580 Other specified disorders of bone density and structure, unspecified site: Secondary | ICD-10-CM | POA: Diagnosis not present

## 2021-11-18 DIAGNOSIS — I10 Essential (primary) hypertension: Secondary | ICD-10-CM | POA: Diagnosis not present

## 2021-11-18 DIAGNOSIS — R5381 Other malaise: Secondary | ICD-10-CM | POA: Diagnosis not present

## 2021-11-19 DIAGNOSIS — N184 Chronic kidney disease, stage 4 (severe): Secondary | ICD-10-CM | POA: Diagnosis not present

## 2021-11-19 DIAGNOSIS — I129 Hypertensive chronic kidney disease with stage 1 through stage 4 chronic kidney disease, or unspecified chronic kidney disease: Secondary | ICD-10-CM | POA: Diagnosis not present

## 2021-11-19 DIAGNOSIS — R1310 Dysphagia, unspecified: Secondary | ICD-10-CM | POA: Diagnosis not present

## 2021-11-19 DIAGNOSIS — M19032 Primary osteoarthritis, left wrist: Secondary | ICD-10-CM | POA: Diagnosis not present

## 2021-11-19 DIAGNOSIS — R5381 Other malaise: Secondary | ICD-10-CM | POA: Diagnosis not present

## 2021-11-19 DIAGNOSIS — M8580 Other specified disorders of bone density and structure, unspecified site: Secondary | ICD-10-CM | POA: Diagnosis not present

## 2021-11-19 DIAGNOSIS — I10 Essential (primary) hypertension: Secondary | ICD-10-CM | POA: Diagnosis not present

## 2021-11-19 DIAGNOSIS — M859 Disorder of bone density and structure, unspecified: Secondary | ICD-10-CM | POA: Diagnosis not present

## 2021-11-19 DIAGNOSIS — I82409 Acute embolism and thrombosis of unspecified deep veins of unspecified lower extremity: Secondary | ICD-10-CM | POA: Diagnosis not present

## 2021-11-19 DIAGNOSIS — G894 Chronic pain syndrome: Secondary | ICD-10-CM | POA: Diagnosis not present

## 2021-11-19 DIAGNOSIS — F32A Depression, unspecified: Secondary | ICD-10-CM | POA: Diagnosis not present

## 2021-11-19 DIAGNOSIS — N189 Chronic kidney disease, unspecified: Secondary | ICD-10-CM | POA: Diagnosis not present

## 2021-11-19 DIAGNOSIS — R2232 Localized swelling, mass and lump, left upper limb: Secondary | ICD-10-CM | POA: Diagnosis not present

## 2021-11-19 DIAGNOSIS — I4891 Unspecified atrial fibrillation: Secondary | ICD-10-CM | POA: Diagnosis not present

## 2021-11-19 DIAGNOSIS — M6281 Muscle weakness (generalized): Secondary | ICD-10-CM | POA: Diagnosis not present

## 2021-11-20 DIAGNOSIS — I129 Hypertensive chronic kidney disease with stage 1 through stage 4 chronic kidney disease, or unspecified chronic kidney disease: Secondary | ICD-10-CM | POA: Diagnosis not present

## 2021-11-20 DIAGNOSIS — M859 Disorder of bone density and structure, unspecified: Secondary | ICD-10-CM | POA: Diagnosis not present

## 2021-11-20 DIAGNOSIS — G894 Chronic pain syndrome: Secondary | ICD-10-CM | POA: Diagnosis not present

## 2021-11-20 DIAGNOSIS — M19032 Primary osteoarthritis, left wrist: Secondary | ICD-10-CM | POA: Diagnosis not present

## 2021-11-20 DIAGNOSIS — R339 Retention of urine, unspecified: Secondary | ICD-10-CM | POA: Diagnosis not present

## 2021-11-20 DIAGNOSIS — R1310 Dysphagia, unspecified: Secondary | ICD-10-CM | POA: Diagnosis not present

## 2021-11-20 DIAGNOSIS — N184 Chronic kidney disease, stage 4 (severe): Secondary | ICD-10-CM | POA: Diagnosis not present

## 2021-11-20 DIAGNOSIS — N189 Chronic kidney disease, unspecified: Secondary | ICD-10-CM | POA: Diagnosis not present

## 2021-11-20 DIAGNOSIS — M6281 Muscle weakness (generalized): Secondary | ICD-10-CM | POA: Diagnosis not present

## 2021-11-20 DIAGNOSIS — R2232 Localized swelling, mass and lump, left upper limb: Secondary | ICD-10-CM | POA: Diagnosis not present

## 2021-11-20 DIAGNOSIS — R5381 Other malaise: Secondary | ICD-10-CM | POA: Diagnosis not present

## 2021-11-20 DIAGNOSIS — I10 Essential (primary) hypertension: Secondary | ICD-10-CM | POA: Diagnosis not present

## 2021-11-20 DIAGNOSIS — M8580 Other specified disorders of bone density and structure, unspecified site: Secondary | ICD-10-CM | POA: Diagnosis not present

## 2021-11-20 DIAGNOSIS — R278 Other lack of coordination: Secondary | ICD-10-CM | POA: Diagnosis not present

## 2021-11-25 DIAGNOSIS — I10 Essential (primary) hypertension: Secondary | ICD-10-CM | POA: Diagnosis not present

## 2021-12-04 DIAGNOSIS — I10 Essential (primary) hypertension: Secondary | ICD-10-CM | POA: Diagnosis not present

## 2021-12-04 DIAGNOSIS — I129 Hypertensive chronic kidney disease with stage 1 through stage 4 chronic kidney disease, or unspecified chronic kidney disease: Secondary | ICD-10-CM | POA: Diagnosis not present

## 2021-12-04 DIAGNOSIS — M859 Disorder of bone density and structure, unspecified: Secondary | ICD-10-CM | POA: Diagnosis not present

## 2021-12-04 DIAGNOSIS — M6281 Muscle weakness (generalized): Secondary | ICD-10-CM | POA: Diagnosis not present

## 2021-12-04 DIAGNOSIS — M19032 Primary osteoarthritis, left wrist: Secondary | ICD-10-CM | POA: Diagnosis not present

## 2021-12-04 DIAGNOSIS — R2232 Localized swelling, mass and lump, left upper limb: Secondary | ICD-10-CM | POA: Diagnosis not present

## 2021-12-04 DIAGNOSIS — R5381 Other malaise: Secondary | ICD-10-CM | POA: Diagnosis not present

## 2021-12-04 DIAGNOSIS — M8580 Other specified disorders of bone density and structure, unspecified site: Secondary | ICD-10-CM | POA: Diagnosis not present

## 2021-12-04 DIAGNOSIS — N184 Chronic kidney disease, stage 4 (severe): Secondary | ICD-10-CM | POA: Diagnosis not present

## 2021-12-04 DIAGNOSIS — G894 Chronic pain syndrome: Secondary | ICD-10-CM | POA: Diagnosis not present

## 2021-12-04 DIAGNOSIS — R1310 Dysphagia, unspecified: Secondary | ICD-10-CM | POA: Diagnosis not present

## 2021-12-04 DIAGNOSIS — N189 Chronic kidney disease, unspecified: Secondary | ICD-10-CM | POA: Diagnosis not present

## 2021-12-07 DIAGNOSIS — N184 Chronic kidney disease, stage 4 (severe): Secondary | ICD-10-CM | POA: Diagnosis not present

## 2021-12-07 DIAGNOSIS — R5381 Other malaise: Secondary | ICD-10-CM | POA: Diagnosis not present

## 2021-12-07 DIAGNOSIS — I129 Hypertensive chronic kidney disease with stage 1 through stage 4 chronic kidney disease, or unspecified chronic kidney disease: Secondary | ICD-10-CM | POA: Diagnosis not present

## 2021-12-07 DIAGNOSIS — M19032 Primary osteoarthritis, left wrist: Secondary | ICD-10-CM | POA: Diagnosis not present

## 2021-12-07 DIAGNOSIS — G894 Chronic pain syndrome: Secondary | ICD-10-CM | POA: Diagnosis not present

## 2021-12-07 DIAGNOSIS — M859 Disorder of bone density and structure, unspecified: Secondary | ICD-10-CM | POA: Diagnosis not present

## 2021-12-07 DIAGNOSIS — I10 Essential (primary) hypertension: Secondary | ICD-10-CM | POA: Diagnosis not present

## 2021-12-07 DIAGNOSIS — R2232 Localized swelling, mass and lump, left upper limb: Secondary | ICD-10-CM | POA: Diagnosis not present

## 2021-12-07 DIAGNOSIS — M6281 Muscle weakness (generalized): Secondary | ICD-10-CM | POA: Diagnosis not present

## 2021-12-07 DIAGNOSIS — M8580 Other specified disorders of bone density and structure, unspecified site: Secondary | ICD-10-CM | POA: Diagnosis not present

## 2021-12-07 DIAGNOSIS — N189 Chronic kidney disease, unspecified: Secondary | ICD-10-CM | POA: Diagnosis not present

## 2021-12-07 DIAGNOSIS — R1310 Dysphagia, unspecified: Secondary | ICD-10-CM | POA: Diagnosis not present

## 2021-12-08 DIAGNOSIS — M19032 Primary osteoarthritis, left wrist: Secondary | ICD-10-CM | POA: Diagnosis not present

## 2021-12-08 DIAGNOSIS — M859 Disorder of bone density and structure, unspecified: Secondary | ICD-10-CM | POA: Diagnosis not present

## 2021-12-08 DIAGNOSIS — R2232 Localized swelling, mass and lump, left upper limb: Secondary | ICD-10-CM | POA: Diagnosis not present

## 2021-12-08 DIAGNOSIS — I10 Essential (primary) hypertension: Secondary | ICD-10-CM | POA: Diagnosis not present

## 2021-12-08 DIAGNOSIS — G894 Chronic pain syndrome: Secondary | ICD-10-CM | POA: Diagnosis not present

## 2021-12-08 DIAGNOSIS — M8580 Other specified disorders of bone density and structure, unspecified site: Secondary | ICD-10-CM | POA: Diagnosis not present

## 2021-12-08 DIAGNOSIS — N189 Chronic kidney disease, unspecified: Secondary | ICD-10-CM | POA: Diagnosis not present

## 2021-12-08 DIAGNOSIS — N184 Chronic kidney disease, stage 4 (severe): Secondary | ICD-10-CM | POA: Diagnosis not present

## 2021-12-08 DIAGNOSIS — R1310 Dysphagia, unspecified: Secondary | ICD-10-CM | POA: Diagnosis not present

## 2021-12-08 DIAGNOSIS — I129 Hypertensive chronic kidney disease with stage 1 through stage 4 chronic kidney disease, or unspecified chronic kidney disease: Secondary | ICD-10-CM | POA: Diagnosis not present

## 2021-12-08 DIAGNOSIS — M6281 Muscle weakness (generalized): Secondary | ICD-10-CM | POA: Diagnosis not present

## 2021-12-08 DIAGNOSIS — R5381 Other malaise: Secondary | ICD-10-CM | POA: Diagnosis not present

## 2021-12-09 DIAGNOSIS — R5381 Other malaise: Secondary | ICD-10-CM | POA: Diagnosis not present

## 2021-12-09 DIAGNOSIS — M6281 Muscle weakness (generalized): Secondary | ICD-10-CM | POA: Diagnosis not present

## 2021-12-09 DIAGNOSIS — D638 Anemia in other chronic diseases classified elsewhere: Secondary | ICD-10-CM | POA: Diagnosis not present

## 2021-12-09 DIAGNOSIS — M8580 Other specified disorders of bone density and structure, unspecified site: Secondary | ICD-10-CM | POA: Diagnosis not present

## 2021-12-09 DIAGNOSIS — I129 Hypertensive chronic kidney disease with stage 1 through stage 4 chronic kidney disease, or unspecified chronic kidney disease: Secondary | ICD-10-CM | POA: Diagnosis not present

## 2021-12-09 DIAGNOSIS — N184 Chronic kidney disease, stage 4 (severe): Secondary | ICD-10-CM | POA: Diagnosis not present

## 2021-12-09 DIAGNOSIS — G8929 Other chronic pain: Secondary | ICD-10-CM | POA: Diagnosis not present

## 2021-12-09 DIAGNOSIS — R2232 Localized swelling, mass and lump, left upper limb: Secondary | ICD-10-CM | POA: Diagnosis not present

## 2021-12-09 DIAGNOSIS — M859 Disorder of bone density and structure, unspecified: Secondary | ICD-10-CM | POA: Diagnosis not present

## 2021-12-09 DIAGNOSIS — Z903 Acquired absence of stomach [part of]: Secondary | ICD-10-CM | POA: Diagnosis not present

## 2021-12-09 DIAGNOSIS — I4891 Unspecified atrial fibrillation: Secondary | ICD-10-CM | POA: Diagnosis not present

## 2021-12-09 DIAGNOSIS — F32A Depression, unspecified: Secondary | ICD-10-CM | POA: Diagnosis not present

## 2021-12-09 DIAGNOSIS — G894 Chronic pain syndrome: Secondary | ICD-10-CM | POA: Diagnosis not present

## 2021-12-09 DIAGNOSIS — M436 Torticollis: Secondary | ICD-10-CM | POA: Diagnosis not present

## 2021-12-09 DIAGNOSIS — I82409 Acute embolism and thrombosis of unspecified deep veins of unspecified lower extremity: Secondary | ICD-10-CM | POA: Diagnosis not present

## 2021-12-09 DIAGNOSIS — M19032 Primary osteoarthritis, left wrist: Secondary | ICD-10-CM | POA: Diagnosis not present

## 2021-12-09 DIAGNOSIS — R1310 Dysphagia, unspecified: Secondary | ICD-10-CM | POA: Diagnosis not present

## 2021-12-09 DIAGNOSIS — I10 Essential (primary) hypertension: Secondary | ICD-10-CM | POA: Diagnosis not present

## 2021-12-09 DIAGNOSIS — N189 Chronic kidney disease, unspecified: Secondary | ICD-10-CM | POA: Diagnosis not present

## 2021-12-09 DIAGNOSIS — D500 Iron deficiency anemia secondary to blood loss (chronic): Secondary | ICD-10-CM | POA: Diagnosis not present

## 2021-12-10 DIAGNOSIS — I129 Hypertensive chronic kidney disease with stage 1 through stage 4 chronic kidney disease, or unspecified chronic kidney disease: Secondary | ICD-10-CM | POA: Diagnosis not present

## 2021-12-10 DIAGNOSIS — R1310 Dysphagia, unspecified: Secondary | ICD-10-CM | POA: Diagnosis not present

## 2021-12-10 DIAGNOSIS — N184 Chronic kidney disease, stage 4 (severe): Secondary | ICD-10-CM | POA: Diagnosis not present

## 2021-12-10 DIAGNOSIS — M859 Disorder of bone density and structure, unspecified: Secondary | ICD-10-CM | POA: Diagnosis not present

## 2021-12-10 DIAGNOSIS — M19032 Primary osteoarthritis, left wrist: Secondary | ICD-10-CM | POA: Diagnosis not present

## 2021-12-10 DIAGNOSIS — M6281 Muscle weakness (generalized): Secondary | ICD-10-CM | POA: Diagnosis not present

## 2021-12-10 DIAGNOSIS — I10 Essential (primary) hypertension: Secondary | ICD-10-CM | POA: Diagnosis not present

## 2021-12-10 DIAGNOSIS — G894 Chronic pain syndrome: Secondary | ICD-10-CM | POA: Diagnosis not present

## 2021-12-10 DIAGNOSIS — R5381 Other malaise: Secondary | ICD-10-CM | POA: Diagnosis not present

## 2021-12-10 DIAGNOSIS — M8580 Other specified disorders of bone density and structure, unspecified site: Secondary | ICD-10-CM | POA: Diagnosis not present

## 2021-12-10 DIAGNOSIS — R2232 Localized swelling, mass and lump, left upper limb: Secondary | ICD-10-CM | POA: Diagnosis not present

## 2021-12-10 DIAGNOSIS — N189 Chronic kidney disease, unspecified: Secondary | ICD-10-CM | POA: Diagnosis not present

## 2021-12-11 DIAGNOSIS — M8580 Other specified disorders of bone density and structure, unspecified site: Secondary | ICD-10-CM | POA: Diagnosis not present

## 2021-12-11 DIAGNOSIS — N189 Chronic kidney disease, unspecified: Secondary | ICD-10-CM | POA: Diagnosis not present

## 2021-12-11 DIAGNOSIS — R1310 Dysphagia, unspecified: Secondary | ICD-10-CM | POA: Diagnosis not present

## 2021-12-11 DIAGNOSIS — M6281 Muscle weakness (generalized): Secondary | ICD-10-CM | POA: Diagnosis not present

## 2021-12-11 DIAGNOSIS — N184 Chronic kidney disease, stage 4 (severe): Secondary | ICD-10-CM | POA: Diagnosis not present

## 2021-12-11 DIAGNOSIS — M19032 Primary osteoarthritis, left wrist: Secondary | ICD-10-CM | POA: Diagnosis not present

## 2021-12-11 DIAGNOSIS — M859 Disorder of bone density and structure, unspecified: Secondary | ICD-10-CM | POA: Diagnosis not present

## 2021-12-11 DIAGNOSIS — I10 Essential (primary) hypertension: Secondary | ICD-10-CM | POA: Diagnosis not present

## 2021-12-11 DIAGNOSIS — G894 Chronic pain syndrome: Secondary | ICD-10-CM | POA: Diagnosis not present

## 2021-12-11 DIAGNOSIS — R2232 Localized swelling, mass and lump, left upper limb: Secondary | ICD-10-CM | POA: Diagnosis not present

## 2021-12-11 DIAGNOSIS — R5381 Other malaise: Secondary | ICD-10-CM | POA: Diagnosis not present

## 2021-12-11 DIAGNOSIS — I129 Hypertensive chronic kidney disease with stage 1 through stage 4 chronic kidney disease, or unspecified chronic kidney disease: Secondary | ICD-10-CM | POA: Diagnosis not present

## 2021-12-14 DIAGNOSIS — I10 Essential (primary) hypertension: Secondary | ICD-10-CM | POA: Diagnosis not present

## 2021-12-14 DIAGNOSIS — R1310 Dysphagia, unspecified: Secondary | ICD-10-CM | POA: Diagnosis not present

## 2021-12-14 DIAGNOSIS — M6281 Muscle weakness (generalized): Secondary | ICD-10-CM | POA: Diagnosis not present

## 2021-12-14 DIAGNOSIS — R2232 Localized swelling, mass and lump, left upper limb: Secondary | ICD-10-CM | POA: Diagnosis not present

## 2021-12-14 DIAGNOSIS — G894 Chronic pain syndrome: Secondary | ICD-10-CM | POA: Diagnosis not present

## 2021-12-14 DIAGNOSIS — N184 Chronic kidney disease, stage 4 (severe): Secondary | ICD-10-CM | POA: Diagnosis not present

## 2021-12-14 DIAGNOSIS — N189 Chronic kidney disease, unspecified: Secondary | ICD-10-CM | POA: Diagnosis not present

## 2021-12-14 DIAGNOSIS — M19032 Primary osteoarthritis, left wrist: Secondary | ICD-10-CM | POA: Diagnosis not present

## 2021-12-14 DIAGNOSIS — M859 Disorder of bone density and structure, unspecified: Secondary | ICD-10-CM | POA: Diagnosis not present

## 2021-12-14 DIAGNOSIS — R5381 Other malaise: Secondary | ICD-10-CM | POA: Diagnosis not present

## 2021-12-14 DIAGNOSIS — I129 Hypertensive chronic kidney disease with stage 1 through stage 4 chronic kidney disease, or unspecified chronic kidney disease: Secondary | ICD-10-CM | POA: Diagnosis not present

## 2021-12-14 DIAGNOSIS — M8580 Other specified disorders of bone density and structure, unspecified site: Secondary | ICD-10-CM | POA: Diagnosis not present

## 2021-12-15 DIAGNOSIS — I129 Hypertensive chronic kidney disease with stage 1 through stage 4 chronic kidney disease, or unspecified chronic kidney disease: Secondary | ICD-10-CM | POA: Diagnosis not present

## 2021-12-15 DIAGNOSIS — R5381 Other malaise: Secondary | ICD-10-CM | POA: Diagnosis not present

## 2021-12-15 DIAGNOSIS — N189 Chronic kidney disease, unspecified: Secondary | ICD-10-CM | POA: Diagnosis not present

## 2021-12-15 DIAGNOSIS — M19032 Primary osteoarthritis, left wrist: Secondary | ICD-10-CM | POA: Diagnosis not present

## 2021-12-15 DIAGNOSIS — R1310 Dysphagia, unspecified: Secondary | ICD-10-CM | POA: Diagnosis not present

## 2021-12-15 DIAGNOSIS — M8580 Other specified disorders of bone density and structure, unspecified site: Secondary | ICD-10-CM | POA: Diagnosis not present

## 2021-12-15 DIAGNOSIS — I10 Essential (primary) hypertension: Secondary | ICD-10-CM | POA: Diagnosis not present

## 2021-12-15 DIAGNOSIS — N184 Chronic kidney disease, stage 4 (severe): Secondary | ICD-10-CM | POA: Diagnosis not present

## 2021-12-15 DIAGNOSIS — M859 Disorder of bone density and structure, unspecified: Secondary | ICD-10-CM | POA: Diagnosis not present

## 2021-12-15 DIAGNOSIS — R2232 Localized swelling, mass and lump, left upper limb: Secondary | ICD-10-CM | POA: Diagnosis not present

## 2021-12-15 DIAGNOSIS — M6281 Muscle weakness (generalized): Secondary | ICD-10-CM | POA: Diagnosis not present

## 2021-12-15 DIAGNOSIS — G894 Chronic pain syndrome: Secondary | ICD-10-CM | POA: Diagnosis not present

## 2021-12-16 DIAGNOSIS — R1310 Dysphagia, unspecified: Secondary | ICD-10-CM | POA: Diagnosis not present

## 2021-12-16 DIAGNOSIS — I4891 Unspecified atrial fibrillation: Secondary | ICD-10-CM | POA: Diagnosis not present

## 2021-12-16 DIAGNOSIS — I129 Hypertensive chronic kidney disease with stage 1 through stage 4 chronic kidney disease, or unspecified chronic kidney disease: Secondary | ICD-10-CM | POA: Diagnosis not present

## 2021-12-16 DIAGNOSIS — M8580 Other specified disorders of bone density and structure, unspecified site: Secondary | ICD-10-CM | POA: Diagnosis not present

## 2021-12-16 DIAGNOSIS — M19032 Primary osteoarthritis, left wrist: Secondary | ICD-10-CM | POA: Diagnosis not present

## 2021-12-16 DIAGNOSIS — G894 Chronic pain syndrome: Secondary | ICD-10-CM | POA: Diagnosis not present

## 2021-12-16 DIAGNOSIS — N189 Chronic kidney disease, unspecified: Secondary | ICD-10-CM | POA: Diagnosis not present

## 2021-12-16 DIAGNOSIS — M6281 Muscle weakness (generalized): Secondary | ICD-10-CM | POA: Diagnosis not present

## 2021-12-16 DIAGNOSIS — R2232 Localized swelling, mass and lump, left upper limb: Secondary | ICD-10-CM | POA: Diagnosis not present

## 2021-12-16 DIAGNOSIS — M859 Disorder of bone density and structure, unspecified: Secondary | ICD-10-CM | POA: Diagnosis not present

## 2021-12-16 DIAGNOSIS — I10 Essential (primary) hypertension: Secondary | ICD-10-CM | POA: Diagnosis not present

## 2021-12-16 DIAGNOSIS — N184 Chronic kidney disease, stage 4 (severe): Secondary | ICD-10-CM | POA: Diagnosis not present

## 2021-12-16 DIAGNOSIS — R5381 Other malaise: Secondary | ICD-10-CM | POA: Diagnosis not present

## 2021-12-17 DIAGNOSIS — R2232 Localized swelling, mass and lump, left upper limb: Secondary | ICD-10-CM | POA: Diagnosis not present

## 2021-12-17 DIAGNOSIS — G894 Chronic pain syndrome: Secondary | ICD-10-CM | POA: Diagnosis not present

## 2021-12-17 DIAGNOSIS — M859 Disorder of bone density and structure, unspecified: Secondary | ICD-10-CM | POA: Diagnosis not present

## 2021-12-17 DIAGNOSIS — I82409 Acute embolism and thrombosis of unspecified deep veins of unspecified lower extremity: Secondary | ICD-10-CM | POA: Diagnosis not present

## 2021-12-17 DIAGNOSIS — R5381 Other malaise: Secondary | ICD-10-CM | POA: Diagnosis not present

## 2021-12-17 DIAGNOSIS — M6281 Muscle weakness (generalized): Secondary | ICD-10-CM | POA: Diagnosis not present

## 2021-12-17 DIAGNOSIS — I10 Essential (primary) hypertension: Secondary | ICD-10-CM | POA: Diagnosis not present

## 2021-12-17 DIAGNOSIS — F32A Depression, unspecified: Secondary | ICD-10-CM | POA: Diagnosis not present

## 2021-12-17 DIAGNOSIS — I129 Hypertensive chronic kidney disease with stage 1 through stage 4 chronic kidney disease, or unspecified chronic kidney disease: Secondary | ICD-10-CM | POA: Diagnosis not present

## 2021-12-17 DIAGNOSIS — R1310 Dysphagia, unspecified: Secondary | ICD-10-CM | POA: Diagnosis not present

## 2021-12-17 DIAGNOSIS — M19032 Primary osteoarthritis, left wrist: Secondary | ICD-10-CM | POA: Diagnosis not present

## 2021-12-17 DIAGNOSIS — N189 Chronic kidney disease, unspecified: Secondary | ICD-10-CM | POA: Diagnosis not present

## 2021-12-17 DIAGNOSIS — M8580 Other specified disorders of bone density and structure, unspecified site: Secondary | ICD-10-CM | POA: Diagnosis not present

## 2021-12-17 DIAGNOSIS — I4891 Unspecified atrial fibrillation: Secondary | ICD-10-CM | POA: Diagnosis not present

## 2021-12-17 DIAGNOSIS — N184 Chronic kidney disease, stage 4 (severe): Secondary | ICD-10-CM | POA: Diagnosis not present

## 2021-12-18 DIAGNOSIS — R112 Nausea with vomiting, unspecified: Secondary | ICD-10-CM | POA: Diagnosis not present

## 2021-12-18 DIAGNOSIS — M859 Disorder of bone density and structure, unspecified: Secondary | ICD-10-CM | POA: Diagnosis not present

## 2021-12-18 DIAGNOSIS — R5381 Other malaise: Secondary | ICD-10-CM | POA: Diagnosis not present

## 2021-12-18 DIAGNOSIS — R1310 Dysphagia, unspecified: Secondary | ICD-10-CM | POA: Diagnosis not present

## 2021-12-18 DIAGNOSIS — I129 Hypertensive chronic kidney disease with stage 1 through stage 4 chronic kidney disease, or unspecified chronic kidney disease: Secondary | ICD-10-CM | POA: Diagnosis not present

## 2021-12-18 DIAGNOSIS — I10 Essential (primary) hypertension: Secondary | ICD-10-CM | POA: Diagnosis not present

## 2021-12-18 DIAGNOSIS — M19032 Primary osteoarthritis, left wrist: Secondary | ICD-10-CM | POA: Diagnosis not present

## 2021-12-18 DIAGNOSIS — N184 Chronic kidney disease, stage 4 (severe): Secondary | ICD-10-CM | POA: Diagnosis not present

## 2021-12-18 DIAGNOSIS — N189 Chronic kidney disease, unspecified: Secondary | ICD-10-CM | POA: Diagnosis not present

## 2021-12-18 DIAGNOSIS — M8580 Other specified disorders of bone density and structure, unspecified site: Secondary | ICD-10-CM | POA: Diagnosis not present

## 2021-12-18 DIAGNOSIS — G894 Chronic pain syndrome: Secondary | ICD-10-CM | POA: Diagnosis not present

## 2021-12-18 DIAGNOSIS — R2232 Localized swelling, mass and lump, left upper limb: Secondary | ICD-10-CM | POA: Diagnosis not present

## 2021-12-18 DIAGNOSIS — M6281 Muscle weakness (generalized): Secondary | ICD-10-CM | POA: Diagnosis not present

## 2021-12-23 DIAGNOSIS — I129 Hypertensive chronic kidney disease with stage 1 through stage 4 chronic kidney disease, or unspecified chronic kidney disease: Secondary | ICD-10-CM | POA: Diagnosis not present

## 2021-12-28 DIAGNOSIS — N390 Urinary tract infection, site not specified: Secondary | ICD-10-CM | POA: Diagnosis not present

## 2021-12-30 DIAGNOSIS — I129 Hypertensive chronic kidney disease with stage 1 through stage 4 chronic kidney disease, or unspecified chronic kidney disease: Secondary | ICD-10-CM | POA: Diagnosis not present

## 2021-12-30 DIAGNOSIS — Z48817 Encounter for surgical aftercare following surgery on the skin and subcutaneous tissue: Secondary | ICD-10-CM | POA: Diagnosis not present

## 2021-12-30 DIAGNOSIS — T24311D Burn of third degree of right thigh, subsequent encounter: Secondary | ICD-10-CM | POA: Diagnosis not present

## 2021-12-30 DIAGNOSIS — T25321D Burn of third degree of right foot, subsequent encounter: Secondary | ICD-10-CM | POA: Diagnosis not present

## 2021-12-30 DIAGNOSIS — T24312D Burn of third degree of left thigh, subsequent encounter: Secondary | ICD-10-CM | POA: Diagnosis not present

## 2021-12-30 DIAGNOSIS — G894 Chronic pain syndrome: Secondary | ICD-10-CM | POA: Diagnosis not present

## 2021-12-30 DIAGNOSIS — T24331D Burn of third degree of right lower leg, subsequent encounter: Secondary | ICD-10-CM | POA: Diagnosis not present

## 2021-12-30 DIAGNOSIS — R2681 Unsteadiness on feet: Secondary | ICD-10-CM | POA: Diagnosis not present

## 2021-12-30 DIAGNOSIS — N184 Chronic kidney disease, stage 4 (severe): Secondary | ICD-10-CM | POA: Diagnosis not present

## 2021-12-30 DIAGNOSIS — M25562 Pain in left knee: Secondary | ICD-10-CM | POA: Diagnosis not present

## 2021-12-30 DIAGNOSIS — M6281 Muscle weakness (generalized): Secondary | ICD-10-CM | POA: Diagnosis not present

## 2021-12-30 DIAGNOSIS — R6510 Systemic inflammatory response syndrome (SIRS) of non-infectious origin without acute organ dysfunction: Secondary | ICD-10-CM | POA: Diagnosis not present

## 2021-12-30 DIAGNOSIS — T24321D Burn of third degree of right knee, subsequent encounter: Secondary | ICD-10-CM | POA: Diagnosis not present

## 2021-12-31 DIAGNOSIS — I129 Hypertensive chronic kidney disease with stage 1 through stage 4 chronic kidney disease, or unspecified chronic kidney disease: Secondary | ICD-10-CM | POA: Diagnosis not present

## 2021-12-31 DIAGNOSIS — T24311D Burn of third degree of right thigh, subsequent encounter: Secondary | ICD-10-CM | POA: Diagnosis not present

## 2021-12-31 DIAGNOSIS — T25321D Burn of third degree of right foot, subsequent encounter: Secondary | ICD-10-CM | POA: Diagnosis not present

## 2021-12-31 DIAGNOSIS — M6281 Muscle weakness (generalized): Secondary | ICD-10-CM | POA: Diagnosis not present

## 2021-12-31 DIAGNOSIS — T24321D Burn of third degree of right knee, subsequent encounter: Secondary | ICD-10-CM | POA: Diagnosis not present

## 2021-12-31 DIAGNOSIS — R6510 Systemic inflammatory response syndrome (SIRS) of non-infectious origin without acute organ dysfunction: Secondary | ICD-10-CM | POA: Diagnosis not present

## 2021-12-31 DIAGNOSIS — T24331D Burn of third degree of right lower leg, subsequent encounter: Secondary | ICD-10-CM | POA: Diagnosis not present

## 2021-12-31 DIAGNOSIS — R2681 Unsteadiness on feet: Secondary | ICD-10-CM | POA: Diagnosis not present

## 2021-12-31 DIAGNOSIS — N184 Chronic kidney disease, stage 4 (severe): Secondary | ICD-10-CM | POA: Diagnosis not present

## 2021-12-31 DIAGNOSIS — G894 Chronic pain syndrome: Secondary | ICD-10-CM | POA: Diagnosis not present

## 2021-12-31 DIAGNOSIS — T24312D Burn of third degree of left thigh, subsequent encounter: Secondary | ICD-10-CM | POA: Diagnosis not present

## 2021-12-31 DIAGNOSIS — Z48817 Encounter for surgical aftercare following surgery on the skin and subcutaneous tissue: Secondary | ICD-10-CM | POA: Diagnosis not present

## 2022-01-01 DIAGNOSIS — T24312D Burn of third degree of left thigh, subsequent encounter: Secondary | ICD-10-CM | POA: Diagnosis not present

## 2022-01-01 DIAGNOSIS — Z48817 Encounter for surgical aftercare following surgery on the skin and subcutaneous tissue: Secondary | ICD-10-CM | POA: Diagnosis not present

## 2022-01-01 DIAGNOSIS — T24321D Burn of third degree of right knee, subsequent encounter: Secondary | ICD-10-CM | POA: Diagnosis not present

## 2022-01-01 DIAGNOSIS — T25321D Burn of third degree of right foot, subsequent encounter: Secondary | ICD-10-CM | POA: Diagnosis not present

## 2022-01-01 DIAGNOSIS — N184 Chronic kidney disease, stage 4 (severe): Secondary | ICD-10-CM | POA: Diagnosis not present

## 2022-01-01 DIAGNOSIS — T24331D Burn of third degree of right lower leg, subsequent encounter: Secondary | ICD-10-CM | POA: Diagnosis not present

## 2022-01-01 DIAGNOSIS — G894 Chronic pain syndrome: Secondary | ICD-10-CM | POA: Diagnosis not present

## 2022-01-01 DIAGNOSIS — R6510 Systemic inflammatory response syndrome (SIRS) of non-infectious origin without acute organ dysfunction: Secondary | ICD-10-CM | POA: Diagnosis not present

## 2022-01-01 DIAGNOSIS — R2681 Unsteadiness on feet: Secondary | ICD-10-CM | POA: Diagnosis not present

## 2022-01-01 DIAGNOSIS — I129 Hypertensive chronic kidney disease with stage 1 through stage 4 chronic kidney disease, or unspecified chronic kidney disease: Secondary | ICD-10-CM | POA: Diagnosis not present

## 2022-01-01 DIAGNOSIS — T24311D Burn of third degree of right thigh, subsequent encounter: Secondary | ICD-10-CM | POA: Diagnosis not present

## 2022-01-01 DIAGNOSIS — M6281 Muscle weakness (generalized): Secondary | ICD-10-CM | POA: Diagnosis not present

## 2022-01-04 DIAGNOSIS — R6510 Systemic inflammatory response syndrome (SIRS) of non-infectious origin without acute organ dysfunction: Secondary | ICD-10-CM | POA: Diagnosis not present

## 2022-01-04 DIAGNOSIS — M6281 Muscle weakness (generalized): Secondary | ICD-10-CM | POA: Diagnosis not present

## 2022-01-04 DIAGNOSIS — G894 Chronic pain syndrome: Secondary | ICD-10-CM | POA: Diagnosis not present

## 2022-01-04 DIAGNOSIS — T25321D Burn of third degree of right foot, subsequent encounter: Secondary | ICD-10-CM | POA: Diagnosis not present

## 2022-01-04 DIAGNOSIS — N184 Chronic kidney disease, stage 4 (severe): Secondary | ICD-10-CM | POA: Diagnosis not present

## 2022-01-04 DIAGNOSIS — R2681 Unsteadiness on feet: Secondary | ICD-10-CM | POA: Diagnosis not present

## 2022-01-04 DIAGNOSIS — Z48817 Encounter for surgical aftercare following surgery on the skin and subcutaneous tissue: Secondary | ICD-10-CM | POA: Diagnosis not present

## 2022-01-04 DIAGNOSIS — T24321D Burn of third degree of right knee, subsequent encounter: Secondary | ICD-10-CM | POA: Diagnosis not present

## 2022-01-04 DIAGNOSIS — T24331D Burn of third degree of right lower leg, subsequent encounter: Secondary | ICD-10-CM | POA: Diagnosis not present

## 2022-01-04 DIAGNOSIS — T24312D Burn of third degree of left thigh, subsequent encounter: Secondary | ICD-10-CM | POA: Diagnosis not present

## 2022-01-04 DIAGNOSIS — I129 Hypertensive chronic kidney disease with stage 1 through stage 4 chronic kidney disease, or unspecified chronic kidney disease: Secondary | ICD-10-CM | POA: Diagnosis not present

## 2022-01-04 DIAGNOSIS — T24311D Burn of third degree of right thigh, subsequent encounter: Secondary | ICD-10-CM | POA: Diagnosis not present

## 2022-01-05 DIAGNOSIS — I129 Hypertensive chronic kidney disease with stage 1 through stage 4 chronic kidney disease, or unspecified chronic kidney disease: Secondary | ICD-10-CM | POA: Diagnosis not present

## 2022-01-05 DIAGNOSIS — G894 Chronic pain syndrome: Secondary | ICD-10-CM | POA: Diagnosis not present

## 2022-01-05 DIAGNOSIS — R2681 Unsteadiness on feet: Secondary | ICD-10-CM | POA: Diagnosis not present

## 2022-01-05 DIAGNOSIS — R6510 Systemic inflammatory response syndrome (SIRS) of non-infectious origin without acute organ dysfunction: Secondary | ICD-10-CM | POA: Diagnosis not present

## 2022-01-05 DIAGNOSIS — M6281 Muscle weakness (generalized): Secondary | ICD-10-CM | POA: Diagnosis not present

## 2022-01-05 DIAGNOSIS — N184 Chronic kidney disease, stage 4 (severe): Secondary | ICD-10-CM | POA: Diagnosis not present

## 2022-01-05 DIAGNOSIS — T24312D Burn of third degree of left thigh, subsequent encounter: Secondary | ICD-10-CM | POA: Diagnosis not present

## 2022-01-05 DIAGNOSIS — Z48817 Encounter for surgical aftercare following surgery on the skin and subcutaneous tissue: Secondary | ICD-10-CM | POA: Diagnosis not present

## 2022-01-05 DIAGNOSIS — T24321D Burn of third degree of right knee, subsequent encounter: Secondary | ICD-10-CM | POA: Diagnosis not present

## 2022-01-05 DIAGNOSIS — T24311D Burn of third degree of right thigh, subsequent encounter: Secondary | ICD-10-CM | POA: Diagnosis not present

## 2022-01-05 DIAGNOSIS — T25321D Burn of third degree of right foot, subsequent encounter: Secondary | ICD-10-CM | POA: Diagnosis not present

## 2022-01-05 DIAGNOSIS — T24331D Burn of third degree of right lower leg, subsequent encounter: Secondary | ICD-10-CM | POA: Diagnosis not present

## 2022-01-06 DIAGNOSIS — N183 Chronic kidney disease, stage 3 unspecified: Secondary | ICD-10-CM | POA: Diagnosis not present

## 2022-01-06 DIAGNOSIS — N323 Diverticulum of bladder: Secondary | ICD-10-CM | POA: Diagnosis not present

## 2022-01-06 DIAGNOSIS — I4891 Unspecified atrial fibrillation: Secondary | ICD-10-CM | POA: Diagnosis not present

## 2022-01-06 DIAGNOSIS — M2042 Other hammer toe(s) (acquired), left foot: Secondary | ICD-10-CM | POA: Diagnosis not present

## 2022-01-06 DIAGNOSIS — B351 Tinea unguium: Secondary | ICD-10-CM | POA: Diagnosis not present

## 2022-01-06 DIAGNOSIS — M2041 Other hammer toe(s) (acquired), right foot: Secondary | ICD-10-CM | POA: Diagnosis not present

## 2022-01-06 DIAGNOSIS — N189 Chronic kidney disease, unspecified: Secondary | ICD-10-CM | POA: Diagnosis not present

## 2022-01-06 DIAGNOSIS — R339 Retention of urine, unspecified: Secondary | ICD-10-CM | POA: Diagnosis not present

## 2022-01-06 DIAGNOSIS — I10 Essential (primary) hypertension: Secondary | ICD-10-CM | POA: Diagnosis not present

## 2022-01-08 DIAGNOSIS — T24321D Burn of third degree of right knee, subsequent encounter: Secondary | ICD-10-CM | POA: Diagnosis not present

## 2022-01-08 DIAGNOSIS — T24331D Burn of third degree of right lower leg, subsequent encounter: Secondary | ICD-10-CM | POA: Diagnosis not present

## 2022-01-08 DIAGNOSIS — I10 Essential (primary) hypertension: Secondary | ICD-10-CM | POA: Diagnosis not present

## 2022-01-08 DIAGNOSIS — T24311D Burn of third degree of right thigh, subsequent encounter: Secondary | ICD-10-CM | POA: Diagnosis not present

## 2022-01-08 DIAGNOSIS — T24312D Burn of third degree of left thigh, subsequent encounter: Secondary | ICD-10-CM | POA: Diagnosis not present

## 2022-01-08 DIAGNOSIS — G894 Chronic pain syndrome: Secondary | ICD-10-CM | POA: Diagnosis not present

## 2022-01-08 DIAGNOSIS — R6510 Systemic inflammatory response syndrome (SIRS) of non-infectious origin without acute organ dysfunction: Secondary | ICD-10-CM | POA: Diagnosis not present

## 2022-01-08 DIAGNOSIS — I129 Hypertensive chronic kidney disease with stage 1 through stage 4 chronic kidney disease, or unspecified chronic kidney disease: Secondary | ICD-10-CM | POA: Diagnosis not present

## 2022-01-08 DIAGNOSIS — T25321D Burn of third degree of right foot, subsequent encounter: Secondary | ICD-10-CM | POA: Diagnosis not present

## 2022-01-08 DIAGNOSIS — N189 Chronic kidney disease, unspecified: Secondary | ICD-10-CM | POA: Diagnosis not present

## 2022-01-08 DIAGNOSIS — Z48817 Encounter for surgical aftercare following surgery on the skin and subcutaneous tissue: Secondary | ICD-10-CM | POA: Diagnosis not present

## 2022-01-08 DIAGNOSIS — I4891 Unspecified atrial fibrillation: Secondary | ICD-10-CM | POA: Diagnosis not present

## 2022-01-08 DIAGNOSIS — M6281 Muscle weakness (generalized): Secondary | ICD-10-CM | POA: Diagnosis not present

## 2022-01-08 DIAGNOSIS — G8929 Other chronic pain: Secondary | ICD-10-CM | POA: Diagnosis not present

## 2022-01-08 DIAGNOSIS — N184 Chronic kidney disease, stage 4 (severe): Secondary | ICD-10-CM | POA: Diagnosis not present

## 2022-01-08 DIAGNOSIS — I82409 Acute embolism and thrombosis of unspecified deep veins of unspecified lower extremity: Secondary | ICD-10-CM | POA: Diagnosis not present

## 2022-01-08 DIAGNOSIS — R2681 Unsteadiness on feet: Secondary | ICD-10-CM | POA: Diagnosis not present

## 2022-01-11 DIAGNOSIS — G894 Chronic pain syndrome: Secondary | ICD-10-CM | POA: Diagnosis not present

## 2022-01-11 DIAGNOSIS — Z48817 Encounter for surgical aftercare following surgery on the skin and subcutaneous tissue: Secondary | ICD-10-CM | POA: Diagnosis not present

## 2022-01-11 DIAGNOSIS — T24321D Burn of third degree of right knee, subsequent encounter: Secondary | ICD-10-CM | POA: Diagnosis not present

## 2022-01-11 DIAGNOSIS — R6510 Systemic inflammatory response syndrome (SIRS) of non-infectious origin without acute organ dysfunction: Secondary | ICD-10-CM | POA: Diagnosis not present

## 2022-01-11 DIAGNOSIS — T25321D Burn of third degree of right foot, subsequent encounter: Secondary | ICD-10-CM | POA: Diagnosis not present

## 2022-01-11 DIAGNOSIS — M6281 Muscle weakness (generalized): Secondary | ICD-10-CM | POA: Diagnosis not present

## 2022-01-11 DIAGNOSIS — T24312D Burn of third degree of left thigh, subsequent encounter: Secondary | ICD-10-CM | POA: Diagnosis not present

## 2022-01-11 DIAGNOSIS — T24331D Burn of third degree of right lower leg, subsequent encounter: Secondary | ICD-10-CM | POA: Diagnosis not present

## 2022-01-11 DIAGNOSIS — N184 Chronic kidney disease, stage 4 (severe): Secondary | ICD-10-CM | POA: Diagnosis not present

## 2022-01-11 DIAGNOSIS — R2681 Unsteadiness on feet: Secondary | ICD-10-CM | POA: Diagnosis not present

## 2022-01-11 DIAGNOSIS — T24311D Burn of third degree of right thigh, subsequent encounter: Secondary | ICD-10-CM | POA: Diagnosis not present

## 2022-01-11 DIAGNOSIS — I129 Hypertensive chronic kidney disease with stage 1 through stage 4 chronic kidney disease, or unspecified chronic kidney disease: Secondary | ICD-10-CM | POA: Diagnosis not present

## 2022-01-12 DIAGNOSIS — N19 Unspecified kidney failure: Secondary | ICD-10-CM | POA: Diagnosis not present

## 2022-01-13 DIAGNOSIS — I129 Hypertensive chronic kidney disease with stage 1 through stage 4 chronic kidney disease, or unspecified chronic kidney disease: Secondary | ICD-10-CM | POA: Diagnosis not present

## 2022-01-13 DIAGNOSIS — T24312D Burn of third degree of left thigh, subsequent encounter: Secondary | ICD-10-CM | POA: Diagnosis not present

## 2022-01-13 DIAGNOSIS — T24331D Burn of third degree of right lower leg, subsequent encounter: Secondary | ICD-10-CM | POA: Diagnosis not present

## 2022-01-13 DIAGNOSIS — R6510 Systemic inflammatory response syndrome (SIRS) of non-infectious origin without acute organ dysfunction: Secondary | ICD-10-CM | POA: Diagnosis not present

## 2022-01-13 DIAGNOSIS — N189 Chronic kidney disease, unspecified: Secondary | ICD-10-CM | POA: Diagnosis not present

## 2022-01-13 DIAGNOSIS — G894 Chronic pain syndrome: Secondary | ICD-10-CM | POA: Diagnosis not present

## 2022-01-13 DIAGNOSIS — Z48817 Encounter for surgical aftercare following surgery on the skin and subcutaneous tissue: Secondary | ICD-10-CM | POA: Diagnosis not present

## 2022-01-13 DIAGNOSIS — I4891 Unspecified atrial fibrillation: Secondary | ICD-10-CM | POA: Diagnosis not present

## 2022-01-13 DIAGNOSIS — T24321D Burn of third degree of right knee, subsequent encounter: Secondary | ICD-10-CM | POA: Diagnosis not present

## 2022-01-13 DIAGNOSIS — N184 Chronic kidney disease, stage 4 (severe): Secondary | ICD-10-CM | POA: Diagnosis not present

## 2022-01-13 DIAGNOSIS — R2681 Unsteadiness on feet: Secondary | ICD-10-CM | POA: Diagnosis not present

## 2022-01-13 DIAGNOSIS — M6281 Muscle weakness (generalized): Secondary | ICD-10-CM | POA: Diagnosis not present

## 2022-01-13 DIAGNOSIS — F32A Depression, unspecified: Secondary | ICD-10-CM | POA: Diagnosis not present

## 2022-01-13 DIAGNOSIS — T8459XA Infection and inflammatory reaction due to other internal joint prosthesis, initial encounter: Secondary | ICD-10-CM | POA: Diagnosis not present

## 2022-01-13 DIAGNOSIS — T24311D Burn of third degree of right thigh, subsequent encounter: Secondary | ICD-10-CM | POA: Diagnosis not present

## 2022-01-13 DIAGNOSIS — T25321D Burn of third degree of right foot, subsequent encounter: Secondary | ICD-10-CM | POA: Diagnosis not present

## 2022-01-13 DIAGNOSIS — G8929 Other chronic pain: Secondary | ICD-10-CM | POA: Diagnosis not present

## 2022-01-13 DIAGNOSIS — I10 Essential (primary) hypertension: Secondary | ICD-10-CM | POA: Diagnosis not present

## 2022-01-13 DIAGNOSIS — M62838 Other muscle spasm: Secondary | ICD-10-CM | POA: Diagnosis not present

## 2022-01-13 DIAGNOSIS — I82409 Acute embolism and thrombosis of unspecified deep veins of unspecified lower extremity: Secondary | ICD-10-CM | POA: Diagnosis not present

## 2022-01-14 DIAGNOSIS — T24331D Burn of third degree of right lower leg, subsequent encounter: Secondary | ICD-10-CM | POA: Diagnosis not present

## 2022-01-14 DIAGNOSIS — I129 Hypertensive chronic kidney disease with stage 1 through stage 4 chronic kidney disease, or unspecified chronic kidney disease: Secondary | ICD-10-CM | POA: Diagnosis not present

## 2022-01-14 DIAGNOSIS — T24312D Burn of third degree of left thigh, subsequent encounter: Secondary | ICD-10-CM | POA: Diagnosis not present

## 2022-01-14 DIAGNOSIS — T25321D Burn of third degree of right foot, subsequent encounter: Secondary | ICD-10-CM | POA: Diagnosis not present

## 2022-01-14 DIAGNOSIS — M6281 Muscle weakness (generalized): Secondary | ICD-10-CM | POA: Diagnosis not present

## 2022-01-14 DIAGNOSIS — G894 Chronic pain syndrome: Secondary | ICD-10-CM | POA: Diagnosis not present

## 2022-01-14 DIAGNOSIS — R2681 Unsteadiness on feet: Secondary | ICD-10-CM | POA: Diagnosis not present

## 2022-01-14 DIAGNOSIS — T24321D Burn of third degree of right knee, subsequent encounter: Secondary | ICD-10-CM | POA: Diagnosis not present

## 2022-01-14 DIAGNOSIS — T24311D Burn of third degree of right thigh, subsequent encounter: Secondary | ICD-10-CM | POA: Diagnosis not present

## 2022-01-14 DIAGNOSIS — Z48817 Encounter for surgical aftercare following surgery on the skin and subcutaneous tissue: Secondary | ICD-10-CM | POA: Diagnosis not present

## 2022-01-14 DIAGNOSIS — N184 Chronic kidney disease, stage 4 (severe): Secondary | ICD-10-CM | POA: Diagnosis not present

## 2022-01-14 DIAGNOSIS — R6510 Systemic inflammatory response syndrome (SIRS) of non-infectious origin without acute organ dysfunction: Secondary | ICD-10-CM | POA: Diagnosis not present

## 2022-01-16 DIAGNOSIS — M62838 Other muscle spasm: Secondary | ICD-10-CM | POA: Diagnosis not present

## 2022-01-16 DIAGNOSIS — R791 Abnormal coagulation profile: Secondary | ICD-10-CM | POA: Diagnosis not present

## 2022-01-16 DIAGNOSIS — Z7901 Long term (current) use of anticoagulants: Secondary | ICD-10-CM | POA: Diagnosis not present

## 2022-01-21 DIAGNOSIS — I129 Hypertensive chronic kidney disease with stage 1 through stage 4 chronic kidney disease, or unspecified chronic kidney disease: Secondary | ICD-10-CM | POA: Diagnosis not present

## 2022-01-21 DIAGNOSIS — T25321D Burn of third degree of right foot, subsequent encounter: Secondary | ICD-10-CM | POA: Diagnosis not present

## 2022-01-21 DIAGNOSIS — T24311D Burn of third degree of right thigh, subsequent encounter: Secondary | ICD-10-CM | POA: Diagnosis not present

## 2022-01-21 DIAGNOSIS — M6281 Muscle weakness (generalized): Secondary | ICD-10-CM | POA: Diagnosis not present

## 2022-01-21 DIAGNOSIS — G894 Chronic pain syndrome: Secondary | ICD-10-CM | POA: Diagnosis not present

## 2022-01-21 DIAGNOSIS — T24321D Burn of third degree of right knee, subsequent encounter: Secondary | ICD-10-CM | POA: Diagnosis not present

## 2022-01-21 DIAGNOSIS — Z48817 Encounter for surgical aftercare following surgery on the skin and subcutaneous tissue: Secondary | ICD-10-CM | POA: Diagnosis not present

## 2022-01-21 DIAGNOSIS — T24312D Burn of third degree of left thigh, subsequent encounter: Secondary | ICD-10-CM | POA: Diagnosis not present

## 2022-01-21 DIAGNOSIS — R6510 Systemic inflammatory response syndrome (SIRS) of non-infectious origin without acute organ dysfunction: Secondary | ICD-10-CM | POA: Diagnosis not present

## 2022-01-21 DIAGNOSIS — N184 Chronic kidney disease, stage 4 (severe): Secondary | ICD-10-CM | POA: Diagnosis not present

## 2022-01-21 DIAGNOSIS — T24331D Burn of third degree of right lower leg, subsequent encounter: Secondary | ICD-10-CM | POA: Diagnosis not present

## 2022-01-21 DIAGNOSIS — R2681 Unsteadiness on feet: Secondary | ICD-10-CM | POA: Diagnosis not present

## 2022-01-24 ENCOUNTER — Other Ambulatory Visit: Payer: Self-pay | Admitting: Family Medicine

## 2022-01-26 NOTE — Telephone Encounter (Signed)
Pls contact this patient for PCP information. Pt would need to establish with a PCP for medication refills. Thanks

## 2022-01-26 NOTE — Telephone Encounter (Signed)
LVM for patient to call back for PCP information/to schedule a TOC APPT if needed.

## 2022-01-29 DIAGNOSIS — Z96652 Presence of left artificial knee joint: Secondary | ICD-10-CM | POA: Diagnosis not present

## 2022-01-29 DIAGNOSIS — N1832 Chronic kidney disease, stage 3b: Secondary | ICD-10-CM | POA: Diagnosis not present

## 2022-01-29 DIAGNOSIS — T8484XA Pain due to internal orthopedic prosthetic devices, implants and grafts, initial encounter: Secondary | ICD-10-CM | POA: Diagnosis not present

## 2022-02-03 DIAGNOSIS — M62838 Other muscle spasm: Secondary | ICD-10-CM | POA: Diagnosis not present

## 2022-02-03 DIAGNOSIS — I82409 Acute embolism and thrombosis of unspecified deep veins of unspecified lower extremity: Secondary | ICD-10-CM | POA: Diagnosis not present

## 2022-02-03 DIAGNOSIS — R339 Retention of urine, unspecified: Secondary | ICD-10-CM | POA: Diagnosis not present

## 2022-02-03 DIAGNOSIS — N1330 Unspecified hydronephrosis: Secondary | ICD-10-CM | POA: Diagnosis not present

## 2022-02-03 DIAGNOSIS — F32A Depression, unspecified: Secondary | ICD-10-CM | POA: Diagnosis not present

## 2022-02-03 DIAGNOSIS — I10 Essential (primary) hypertension: Secondary | ICD-10-CM | POA: Diagnosis not present

## 2022-02-03 DIAGNOSIS — I4891 Unspecified atrial fibrillation: Secondary | ICD-10-CM | POA: Diagnosis not present

## 2022-02-05 DIAGNOSIS — R339 Retention of urine, unspecified: Secondary | ICD-10-CM | POA: Diagnosis not present

## 2022-02-05 DIAGNOSIS — R300 Dysuria: Secondary | ICD-10-CM | POA: Diagnosis not present

## 2022-02-05 DIAGNOSIS — N209 Urinary calculus, unspecified: Secondary | ICD-10-CM | POA: Diagnosis not present

## 2022-02-05 DIAGNOSIS — N289 Disorder of kidney and ureter, unspecified: Secondary | ICD-10-CM | POA: Diagnosis not present

## 2022-02-11 ENCOUNTER — Emergency Department (HOSPITAL_COMMUNITY): Payer: Medicare Other

## 2022-02-11 ENCOUNTER — Other Ambulatory Visit: Payer: Self-pay

## 2022-02-11 ENCOUNTER — Observation Stay (HOSPITAL_COMMUNITY)
Admission: EM | Admit: 2022-02-11 | Discharge: 2022-02-13 | Disposition: A | Discharge status: Skilled Nursing Facility | Payer: Medicare Other | Attending: Internal Medicine | Admitting: Internal Medicine

## 2022-02-11 ENCOUNTER — Inpatient Hospital Stay (HOSPITAL_COMMUNITY): Payer: Medicare Other

## 2022-02-11 ENCOUNTER — Encounter (HOSPITAL_COMMUNITY): Payer: Self-pay

## 2022-02-11 DIAGNOSIS — Z7901 Long term (current) use of anticoagulants: Secondary | ICD-10-CM | POA: Diagnosis not present

## 2022-02-11 DIAGNOSIS — R29810 Facial weakness: Secondary | ICD-10-CM | POA: Insufficient documentation

## 2022-02-11 DIAGNOSIS — I471 Supraventricular tachycardia: Secondary | ICD-10-CM | POA: Insufficient documentation

## 2022-02-11 DIAGNOSIS — N390 Urinary tract infection, site not specified: Secondary | ICD-10-CM | POA: Insufficient documentation

## 2022-02-11 DIAGNOSIS — R29898 Other symptoms and signs involving the musculoskeletal system: Secondary | ICD-10-CM

## 2022-02-11 DIAGNOSIS — I10 Essential (primary) hypertension: Secondary | ICD-10-CM | POA: Diagnosis present

## 2022-02-11 DIAGNOSIS — Z9660 Presence of unspecified orthopedic joint implant: Secondary | ICD-10-CM | POA: Insufficient documentation

## 2022-02-11 DIAGNOSIS — N184 Chronic kidney disease, stage 4 (severe): Secondary | ICD-10-CM | POA: Diagnosis not present

## 2022-02-11 DIAGNOSIS — I440 Atrioventricular block, first degree: Secondary | ICD-10-CM | POA: Diagnosis not present

## 2022-02-11 DIAGNOSIS — I639 Cerebral infarction, unspecified: Secondary | ICD-10-CM

## 2022-02-11 DIAGNOSIS — R001 Bradycardia, unspecified: Secondary | ICD-10-CM | POA: Diagnosis not present

## 2022-02-11 DIAGNOSIS — E875 Hyperkalemia: Secondary | ICD-10-CM | POA: Diagnosis not present

## 2022-02-11 DIAGNOSIS — Z85828 Personal history of other malignant neoplasm of skin: Secondary | ICD-10-CM | POA: Diagnosis not present

## 2022-02-11 DIAGNOSIS — D649 Anemia, unspecified: Secondary | ICD-10-CM

## 2022-02-11 DIAGNOSIS — M7989 Other specified soft tissue disorders: Secondary | ICD-10-CM | POA: Diagnosis not present

## 2022-02-11 DIAGNOSIS — R2689 Other abnormalities of gait and mobility: Secondary | ICD-10-CM | POA: Insufficient documentation

## 2022-02-11 DIAGNOSIS — I499 Cardiac arrhythmia, unspecified: Secondary | ICD-10-CM | POA: Insufficient documentation

## 2022-02-11 DIAGNOSIS — G40909 Epilepsy, unspecified, not intractable, without status epilepticus: Secondary | ICD-10-CM | POA: Diagnosis not present

## 2022-02-11 DIAGNOSIS — R9431 Abnormal electrocardiogram [ECG] [EKG]: Secondary | ICD-10-CM | POA: Insufficient documentation

## 2022-02-11 DIAGNOSIS — Z8673 Personal history of transient ischemic attack (TIA), and cerebral infarction without residual deficits: Secondary | ICD-10-CM | POA: Insufficient documentation

## 2022-02-11 DIAGNOSIS — D1809 Hemangioma of other sites: Secondary | ICD-10-CM | POA: Diagnosis not present

## 2022-02-11 DIAGNOSIS — Z66 Do not resuscitate: Secondary | ICD-10-CM | POA: Insufficient documentation

## 2022-02-11 DIAGNOSIS — R7989 Other specified abnormal findings of blood chemistry: Secondary | ICD-10-CM | POA: Diagnosis not present

## 2022-02-11 DIAGNOSIS — G459 Transient cerebral ischemic attack, unspecified: Secondary | ICD-10-CM | POA: Diagnosis not present

## 2022-02-11 DIAGNOSIS — Z86718 Personal history of other venous thrombosis and embolism: Secondary | ICD-10-CM | POA: Insufficient documentation

## 2022-02-11 DIAGNOSIS — R4781 Slurred speech: Secondary | ICD-10-CM | POA: Diagnosis not present

## 2022-02-11 DIAGNOSIS — I252 Old myocardial infarction: Secondary | ICD-10-CM | POA: Diagnosis not present

## 2022-02-11 DIAGNOSIS — M6281 Muscle weakness (generalized): Secondary | ICD-10-CM | POA: Insufficient documentation

## 2022-02-11 DIAGNOSIS — I480 Paroxysmal atrial fibrillation: Secondary | ICD-10-CM | POA: Diagnosis not present

## 2022-02-11 DIAGNOSIS — I4510 Unspecified right bundle-branch block: Secondary | ICD-10-CM | POA: Insufficient documentation

## 2022-02-11 DIAGNOSIS — I491 Atrial premature depolarization: Secondary | ICD-10-CM | POA: Insufficient documentation

## 2022-02-11 DIAGNOSIS — I6782 Cerebral ischemia: Secondary | ICD-10-CM | POA: Insufficient documentation

## 2022-02-11 DIAGNOSIS — B9620 Unspecified Escherichia coli [E. coli] as the cause of diseases classified elsewhere: Secondary | ICD-10-CM | POA: Diagnosis not present

## 2022-02-11 DIAGNOSIS — R531 Weakness: Secondary | ICD-10-CM | POA: Diagnosis not present

## 2022-02-11 DIAGNOSIS — R471 Dysarthria and anarthria: Secondary | ICD-10-CM | POA: Insufficient documentation

## 2022-02-11 DIAGNOSIS — R29818 Other symptoms and signs involving the nervous system: Secondary | ICD-10-CM | POA: Diagnosis not present

## 2022-02-11 DIAGNOSIS — Z79899 Other long term (current) drug therapy: Secondary | ICD-10-CM | POA: Diagnosis not present

## 2022-02-11 DIAGNOSIS — E872 Acidosis, unspecified: Secondary | ICD-10-CM | POA: Diagnosis present

## 2022-02-11 DIAGNOSIS — R2242 Localized swelling, mass and lump, left lower limb: Secondary | ICD-10-CM | POA: Insufficient documentation

## 2022-02-11 DIAGNOSIS — I272 Pulmonary hypertension, unspecified: Secondary | ICD-10-CM | POA: Insufficient documentation

## 2022-02-11 DIAGNOSIS — Z8249 Family history of ischemic heart disease and other diseases of the circulatory system: Secondary | ICD-10-CM | POA: Insufficient documentation

## 2022-02-11 DIAGNOSIS — D638 Anemia in other chronic diseases classified elsewhere: Secondary | ICD-10-CM | POA: Insufficient documentation

## 2022-02-11 DIAGNOSIS — Z91040 Latex allergy status: Secondary | ICD-10-CM | POA: Insufficient documentation

## 2022-02-11 DIAGNOSIS — I129 Hypertensive chronic kidney disease with stage 1 through stage 4 chronic kidney disease, or unspecified chronic kidney disease: Secondary | ICD-10-CM | POA: Diagnosis not present

## 2022-02-11 DIAGNOSIS — E878 Other disorders of electrolyte and fluid balance, not elsewhere classified: Secondary | ICD-10-CM | POA: Insufficient documentation

## 2022-02-11 DIAGNOSIS — I083 Combined rheumatic disorders of mitral, aortic and tricuspid valves: Secondary | ICD-10-CM | POA: Insufficient documentation

## 2022-02-11 DIAGNOSIS — R079 Chest pain, unspecified: Secondary | ICD-10-CM | POA: Insufficient documentation

## 2022-02-11 DIAGNOSIS — D469 Myelodysplastic syndrome, unspecified: Secondary | ICD-10-CM | POA: Insufficient documentation

## 2022-02-11 DIAGNOSIS — R41841 Cognitive communication deficit: Secondary | ICD-10-CM | POA: Insufficient documentation

## 2022-02-11 LAB — DIFFERENTIAL
Abs Immature Granulocytes: 0.01 10*3/uL (ref 0.00–0.07)
Basophils Absolute: 0 10*3/uL (ref 0.0–0.1)
Basophils Relative: 1 %
Eosinophils Absolute: 0.2 10*3/uL (ref 0.0–0.5)
Eosinophils Relative: 5 %
Immature Granulocytes: 0 %
Lymphocytes Relative: 41 %
Lymphs Abs: 1.8 10*3/uL (ref 0.7–4.0)
Monocytes Absolute: 0.5 10*3/uL (ref 0.1–1.0)
Monocytes Relative: 11 %
Neutro Abs: 1.8 10*3/uL (ref 1.7–7.7)
Neutrophils Relative %: 42 %

## 2022-02-11 LAB — CBG MONITORING, ED
Glucose-Capillary: 77 mg/dL (ref 70–99)
Glucose-Capillary: 87 mg/dL (ref 70–99)
Glucose-Capillary: 79 mg/dL (ref 70–99)

## 2022-02-11 LAB — CBC
HCT: 29.3 % — ABNORMAL LOW (ref 36.0–46.0)
Hemoglobin: 8.7 g/dL — ABNORMAL LOW (ref 12.0–15.0)
MCH: 31.2 pg (ref 26.0–34.0)
MCHC: 29.7 g/dL — ABNORMAL LOW (ref 30.0–36.0)
MCV: 105 fL — ABNORMAL HIGH (ref 80.0–100.0)
Platelets: 192 10*3/uL (ref 150–400)
RBC: 2.79 MIL/uL — ABNORMAL LOW (ref 3.87–5.11)
RDW: 17.1 % — ABNORMAL HIGH (ref 11.5–15.5)
WBC: 4.4 10*3/uL (ref 4.0–10.5)
nRBC: 0 % (ref 0.0–0.2)

## 2022-02-11 LAB — COMPREHENSIVE METABOLIC PANEL
ALT: 23 U/L (ref 0–44)
AST: 24 U/L (ref 15–41)
Albumin: 3 g/dL — ABNORMAL LOW (ref 3.5–5.0)
Alkaline Phosphatase: 121 U/L (ref 38–126)
Anion gap: 5 (ref 5–15)
BUN: 40 mg/dL — ABNORMAL HIGH (ref 8–23)
CO2: 17 mmol/L — ABNORMAL LOW (ref 22–32)
Calcium: 8.2 mg/dL — ABNORMAL LOW (ref 8.9–10.3)
Chloride: 120 mmol/L — ABNORMAL HIGH (ref 98–111)
Creatinine, Ser: 1.95 mg/dL — ABNORMAL HIGH (ref 0.44–1.00)
GFR, Estimated: 26 mL/min — ABNORMAL LOW (ref >60)
Glucose, Bld: 91 mg/dL (ref 70–99)
Potassium: 5.9 mmol/L — ABNORMAL HIGH (ref 3.5–5.1)
Sodium: 142 mmol/L (ref 135–145)
Total Bilirubin: 0.5 mg/dL (ref 0.3–1.2)
Total Protein: 6.4 g/dL — ABNORMAL LOW (ref 6.5–8.1)

## 2022-02-11 LAB — POTASSIUM
Potassium: 5.6 mmol/L — ABNORMAL HIGH (ref 3.5–5.1)
Potassium: 5.4 mmol/L — ABNORMAL HIGH (ref 3.5–5.1)

## 2022-02-11 LAB — GLUCOSE, CAPILLARY: Glucose-Capillary: 102 mg/dL — ABNORMAL HIGH (ref 70–99)

## 2022-02-11 LAB — POC OCCULT BLOOD, ED: Fecal Occult Bld: NEGATIVE

## 2022-02-11 LAB — ETHANOL: Alcohol, Ethyl (B): <10 mg/dL (ref <10)

## 2022-02-11 LAB — PROTIME-INR
INR: 1.6 — ABNORMAL HIGH (ref 0.8–1.2)
Prothrombin Time: 18.6 seconds — ABNORMAL HIGH (ref 11.4–15.2)

## 2022-02-11 LAB — APTT: aPTT: 36 seconds (ref 24–36)

## 2022-02-11 MED ORDER — WARFARIN SODIUM 5 MG PO TABS
5.0000 mg | ORAL_TABLET | Freq: Once | ORAL | Status: AC
  Administered 2022-02-11: 5 mg via ORAL
  Filled 2022-02-11: qty 1

## 2022-02-11 MED ORDER — SODIUM BICARBONATE 650 MG PO TABS
650.0000 mg | ORAL_TABLET | Freq: Three times a day (TID) | ORAL | Status: DC
  Administered 2022-02-11 – 2022-02-13 (×6): 650 mg via ORAL
  Filled 2022-02-11 (×6): qty 1

## 2022-02-11 MED ORDER — STROKE: EARLY STAGES OF RECOVERY BOOK
Freq: Once | Status: AC
Start: 2022-02-12 — End: 2022-02-12
  Filled 2022-02-11: qty 1

## 2022-02-11 MED ORDER — DEXTROSE 50 % IV SOLN
1.0000 | Freq: Once | INTRAVENOUS | Status: AC
  Administered 2022-02-11: 50 mL via INTRAVENOUS
  Filled 2022-02-11: qty 50

## 2022-02-11 MED ORDER — ACETAMINOPHEN 500 MG PO TABS
500.0000 mg | ORAL_TABLET | Freq: Every morning | ORAL | Status: DC
  Administered 2022-02-12 – 2022-02-13 (×2): 500 mg via ORAL
  Filled 2022-02-11 (×2): qty 1

## 2022-02-11 MED ORDER — ESCITALOPRAM OXALATE 10 MG PO TABS
5.0000 mg | ORAL_TABLET | Freq: Every day | ORAL | Status: DC
Start: 2022-02-11 — End: 2022-02-13
  Administered 2022-02-11 – 2022-02-12 (×2): 5 mg via ORAL
  Filled 2022-02-11 (×2): qty 1

## 2022-02-11 MED ORDER — ESTRADIOL 0.1 MG/GM VA CREA
1.0000 g | TOPICAL_CREAM | VAGINAL | Status: DC
  Administered 2022-02-12: 1 g via VAGINAL
  Filled 2022-02-11: qty 42.5

## 2022-02-11 MED ORDER — ACETAMINOPHEN 325 MG PO TABS
650.0000 mg | ORAL_TABLET | ORAL | Status: DC | PRN
  Administered 2022-02-11: 650 mg via ORAL
  Filled 2022-02-11: qty 2

## 2022-02-11 MED ORDER — SODIUM ZIRCONIUM CYCLOSILICATE 10 G PO PACK
10.0000 g | PACK | Freq: Every day | ORAL | Status: DC
  Administered 2022-02-11: 10 g via ORAL
  Filled 2022-02-11 (×2): qty 1

## 2022-02-11 MED ORDER — WARFARIN - PHARMACIST DOSING INPATIENT: Freq: Every day | Status: DC

## 2022-02-11 MED ORDER — ACETAMINOPHEN 160 MG/5ML PO SOLN: 650.0000 mg | ORAL | Status: DC | PRN

## 2022-02-11 MED ORDER — ATORVASTATIN CALCIUM 40 MG PO TABS
40.0000 mg | ORAL_TABLET | Freq: Every day | ORAL | Status: DC
  Administered 2022-02-11 – 2022-02-13 (×3): 40 mg via ORAL
  Filled 2022-02-11 (×3): qty 1

## 2022-02-11 MED ORDER — ALLOPURINOL 100 MG PO TABS
100.0000 mg | ORAL_TABLET | Freq: Every day | ORAL | Status: DC
  Administered 2022-02-11 – 2022-02-12 (×2): 100 mg via ORAL
  Filled 2022-02-11 (×2): qty 1

## 2022-02-11 MED ORDER — INSULIN ASPART 100 UNIT/ML IV SOLN
5.0000 [IU] | Freq: Once | INTRAVENOUS | Status: AC
  Administered 2022-02-11: 5 [IU] via INTRAVENOUS

## 2022-02-11 MED ORDER — WARFARIN SODIUM 2.5 MG PO TABS: 2.5000 mg | ORAL_TABLET | Freq: Every evening | ORAL | Status: DC

## 2022-02-11 MED ORDER — WARFARIN SODIUM 2 MG PO TABS: 2.0000 mg | ORAL_TABLET | Freq: Every evening | ORAL | Status: DC

## 2022-02-11 MED ORDER — ACETAMINOPHEN 650 MG RE SUPP: 650.0000 mg | RECTAL | Status: DC | PRN

## 2022-02-11 MED ORDER — VITAMIN B-12 1000 MCG PO TABS
5000.0000 ug | ORAL_TABLET | Freq: Every morning | ORAL | Status: DC
  Administered 2022-02-12 – 2022-02-13 (×2): 5000 ug via ORAL
  Filled 2022-02-11 (×2): qty 5

## 2022-02-11 MED ORDER — INULIN 2 G PO CHEW: 5.0000 g | CHEWABLE_TABLET | Freq: Every day | ORAL | Status: DC

## 2022-02-11 MED ORDER — PANTOPRAZOLE SODIUM 40 MG PO TBEC
40.0000 mg | DELAYED_RELEASE_TABLET | Freq: Every day | ORAL | Status: DC
  Administered 2022-02-11 – 2022-02-13 (×3): 40 mg via ORAL
  Filled 2022-02-11 (×3): qty 1

## 2022-02-11 MED ORDER — CARVEDILOL 12.5 MG PO TABS
25.0000 mg | ORAL_TABLET | Freq: Two times a day (BID) | ORAL | Status: DC
  Administered 2022-02-11 – 2022-02-13 (×4): 25 mg via ORAL
  Filled 2022-02-11 (×4): qty 2

## 2022-02-11 MED ORDER — SODIUM CHLORIDE 0.9 % IV BOLUS
500.0000 mL | Freq: Once | INTRAVENOUS | Status: AC
  Administered 2022-02-11: 500 mL via INTRAVENOUS

## 2022-02-11 MED ORDER — SODIUM CHLORIDE 0.9% FLUSH
3.0000 mL | Freq: Once | INTRAVENOUS | Status: AC
  Administered 2022-02-11: 3 mL via INTRAVENOUS

## 2022-02-11 NOTE — ED Notes (Signed)
Pt remains in MRI at this time. Will update VS upon pts return.

## 2022-02-11 NOTE — Progress Notes (Signed)
Pharmacy Consult for warfarin Indication: atrial fibrillation  Allergies  Allergen Reactions   Avocado Shortness Of Breath   Benadryl [Diphenhydramine] Palpitations    Affected heart rate ER told her not to take again   Cephalosporins Anaphylaxis   Latex Anaphylaxis, Shortness Of Breath and Rash    Airway swelling   Other Rash    Fluoroquinolones - pt prefers not to take after breaking out in a rash after taking ciprofloxacin.   Valium [Diazepam] Shortness Of Breath and Other (See Comments)    Hyperventilation    Zinacef [Cefuroxime] Anaphylaxis   Methylpyrrolidone Hives   Norvasc [Amlodipine] Cough   Penicillins Hives and Swelling    Most mycin drugs   Quinolones Other (See Comments)    Joint pain   Roxicodone [Oxycodone] Hives and Nausea Only   Stadol [Butorphanol] Hives   Sulfa Antibiotics Hives and Swelling   Tomato Other (See Comments)    Mouth sores   Voltaren [Diclofenac] Hives    Pt able to use diclofenac gel 1%   Zofran [Ondansetron] Hives and Other (See Comments)    Sedation   Altace [Ramipril] Cough   Iodine I 131 Tositumomab Other (See Comments)    Listed on MAR as allergy Unknown reaction   Lodine [Etodolac] Other (See Comments)    Listed on MAR as allergy Unknown reaction   Nexium [Esomeprazole Magnesium] Diarrhea and Nausea And Vomiting   Protonix [Pantoprazole] Diarrhea   Zestril [Lisinopril] Cough   Ace Inhibitors Other (See Comments) and Cough    Lisinopril and ramipril    Cipro [Ciprofloxacin Hcl] Rash   Codeine Nausea Only    Patient Measurements: Height: '5\' 3"'$  (160 cm) Weight: 55.6 kg (122 lb 9.2 oz) IBW/kg (Calculated) : 52.4 Heparin Dosing Weight: HEPARIN DW (KG): 55.6  Vital Signs: Temp: 97.9 F (36.6 C) (06/15 0945) Temp Source: Oral (06/15 0945) BP: 144/74 (06/15 1400) Pulse Rate: 70 (06/15 1400)  Labs: Recent Labs    02/11/22 1000  HGB 8.7*  HCT 29.3*  PLT 192  APTT 36  LABPROT 18.6*  INR 1.6*  CREATININE 1.95*     Estimated Creatinine Clearance: 19.4 mL/min (A) (by C-G formula based on SCr of 1.95 mg/dL (H)).  Assessment: Gloria Mckay a 81 y.o. female presented with stroke like symptoms on warfarin PTA for h/o DVT and atrial fibrillation.[facility] been [facility] for warfarin dosing.   INR 1.6 6/15, patient declined TNK for code stroke.   Warfarin PTA: 4.5 mg daily per medication administration guide.   Goal of Therapy:  INR 2-3 Monitor platelets by anticoagulation protocol: Yes   Plan:  Warfarin 5 mg PO x1 Monitor for signs and symptoms of bleeding  Daily INR, CBC   Adria Dill, PharmD PGY-1 Acute Care Resident  02/11/2022 2:14 PM

## 2022-02-11 NOTE — Assessment & Plan Note (Signed)
Continue Coumadin INR is subtherapeutic Pharmacy consult for Coumadin management during this hospitalization

## 2022-02-11 NOTE — Assessment & Plan Note (Signed)
H&H is 8.7g/dl compared to baseline of 11.8 g/dl from 11 months ago. FOBT is negative Worsening anemia may be related to chronic kidney disease Monitor closely during this hospitalization

## 2022-02-11 NOTE — ED Provider Notes (Signed)
[facility] Provider Note   CSN: [id number] Arrival date & time: 02/11/22  9480  An emergency department physician performed an initial assessment on this suspected stroke patient at 0920.  History  Chief Complaint  Patient presents with   Code Stroke    Gloria Mckay is a 81 y.o. female.  HPI 81 yo female presents with report of code stroke.  Patient with lkn at 0800.  By report, daughter called at 0830 and noted obviously abnormal.  When staff went in, she had new right sided weakness.  She is on Coumadin and has a history of seizure disorder.  She was made a code stroke on arrival and was seen by stroke team and ED attending on arrival.  Her airway was cleared and she proceeded to CT scan.  I saw her on arrival to the room in the ED.  She tells me she has baseline weakness on her left and left lower leg swelling with redness and history of DVT which is why she is on the Coumadin     Home Medications Prior to Admission medications   Medication Sig Start Date End Date Taking? Authorizing Provider  acetaminophen (TYLENOL) 500 MG tablet Take 500 mg by mouth in the morning.   Yes [provider]  allopurinol (ZYLOPRIM) 100 MG tablet Take 100 mg by mouth at bedtime.   Yes [provider]  amLODipine (NORVASC) 10 MG tablet Take 1 tablet (10 mg total) by mouth daily. Patient taking differently: Take 10 mg by mouth in the morning. 09/04/18  Yes Emeterio Reeve, DO  Calcium Carb-Cholecalciferol (CALCIUM 600+D) 600-10 MG-MCG TABS Take 1 tablet by mouth daily.   Yes [provider]  carvedilol (COREG) 25 MG tablet Take 1 tablet (25 mg total) by mouth 2 (two) times daily with a meal. 11/05/21  Yes Luetta Nutting, DO  Cyanocobalamin 5000 MCG CAPS Take 5,000 mcg by mouth in the morning.   Yes [provider]  diclofenac Sodium (VOLTAREN) 1 % GEL Apply 4 g topically every 12 (twelve) hours.   Yes [provider]   escitalopram (LEXAPRO) 5 MG tablet Take 5 mg by mouth at bedtime.   Yes [provider]  estradiol (ESTRACE) 0.1 MG/GM vaginal cream Place 1 g vaginally every Monday, Wednesday, and Friday.   Yes [provider]  Glycerin-Hypromellose-PEG 400 0.2-0.2-1 % SOLN Place 2 drops into both eyes 2 (two) times daily.   Yes [provider]  hydrALAZINE (APRESOLINE) 25 MG tablet Take 25 mg by mouth 3 (three) times daily.   Yes [provider]  Inulin (FIBERCHOICE) 2 g CHEW Chew 5 g by mouth daily. Listed on MAR as fiber gummies 2g (inulin) - 2 gummies for daily (for 5g of fiber per serving of 2)   Yes [provider]  loperamide (IMODIUM) 2 MG capsule Take 4 mg by mouth 3 (three) times daily before meals.   Yes [provider]  melatonin 3 MG TABS tablet Take 3 mg by mouth at bedtime.   Yes [provider]  meloxicam (MOBIC) 7.5 MG tablet Take 7.5 mg by mouth every other day.   Yes [provider]  pregabalin (LYRICA) 25 MG capsule Take 25 mg by mouth 2 (two) times daily.   Yes [provider]  sodium bicarbonate 650 MG tablet Take 650 mg by mouth 3 (three) times daily.   Yes [provider]  tiZANidine (ZANAFLEX) 2 MG tablet Take 2 mg by mouth  at bedtime.   Yes [provider]  topiramate (TOPAMAX) 100 MG tablet Take 1.5 tabs (150 mg) in AM and 2 tabs (200 mg) in PM Patient taking differently: Take 100 mg by mouth 2 (two) times daily. 12/26/19  Yes Emeterio Reeve, DO  traMADol-acetaminophen (ULTRACET) 37.5-325 MG tablet Take 2 tablets by mouth at bedtime.   Yes [provider]  warfarin (COUMADIN) 2 MG tablet Take 2 mg by mouth every evening.   Yes [provider]  warfarin (COUMADIN) 2.5 MG tablet Take 2.5 mg by mouth every evening.   Yes [provider]  AMBULATORY NON FORMULARY MEDICATION Medication Name: rollator walker with seat per patient preference and insurance coverage  05/15/20   Emeterio Reeve, DO  Calcium-Vitamin D-Vitamin K 034-742-59 MG-UNT-MCG CHEW Chew 2 tablets by mouth daily. Patient not taking: Reported on 02/11/2022 12/04/20   Emeterio Reeve, DO  cyanocobalamin (,VITAMIN B-12,) 1000 MCG/ML injection Inject 1 mL (1,000 mcg total) into the muscle every 30 (thirty) days. Please include syringes and needles as needed for monthly injections Patient not taking: Reported on 02/11/2022 02/15/20   Silverio Decamp, MD  denosumab (PROLIA) 60 MG/ML SOSY injection Inject 60 mg into the skin every 6 (six) months. Administer in upper arm, thigh, or abdomen Patient not taking: Reported on 02/11/2022 07/18/18   Emeterio Reeve, DO  esomeprazole (NEXIUM) 40 MG capsule Take 1 capsule (40 mg total) by mouth 2 (two) times daily before a meal. Patient not taking: Reported on 02/11/2022 05/02/20   Emeterio Reeve, DO  HYDROcodone-acetaminophen (NORCO/VICODIN) 5-325 MG tablet Take 1-2 tablets by mouth every 4 (four) hours as needed for moderate pain (while awake, maximum 5 tablets per 24 hours). Patient not taking: Reported on 02/11/2022 03/24/21 04/03/22  Emeterio Reeve, DO  hydrOXYzine (ATARAX/VISTARIL) 25 MG tablet Take 0.5-1 tablets (12.5-25 mg total) by mouth every 8 (eight) hours as needed for itching. Patient not taking: Reported on 02/11/2022 12/26/19   Emeterio Reeve, DO  losartan (COZAAR) 100 MG tablet Take 1 tablet (100 mg total) by mouth daily. Patient not taking: Reported on 02/11/2022 04/30/21   Emeterio Reeve, DO  naloxone [facility]) nasal spray 4 mg/0.1 mL use as directed intranasal  every 2 to 3 minutes until emergency team has arrived-- use in opioid emergency only Patient not taking: Reported on 02/11/2022 04/10/21   Emeterio Reeve, DO  warfarin (COUMADIN) 1 MG tablet Take 0.5 mg by mouth every evening. Patient not taking: Reported on 02/11/2022    [provider]  warfarin (COUMADIN) 5 MG tablet take 1 tablet daily, except Thursdays  take 1/2 tablet Patient not taking: Reported on 02/11/2022 03/24/21   Emeterio Reeve, DO      Allergies    Avocado, Benadryl [diphenhydramine], Cephalosporins, Latex, Other, Valium [diazepam], Zinacef [cefuroxime], Methylpyrrolidone, Norvasc [amlodipine], Penicillins, Quinolones, Roxicodone [oxycodone], Stadol [butorphanol], Sulfa antibiotics, Tomato, Voltaren [diclofenac], Zofran [ondansetron], Altace [ramipril], Iodine i 131 tositumomab, Lodine [etodolac], Nexium [esomeprazole magnesium], Protonix [pantoprazole], Zestril [lisinopril], Ace inhibitors, Cipro [ciprofloxacin hcl], and Codeine    Review of Systems   Review of Systems  Physical Exam Updated Vital Signs BP (!) 147/71   Pulse 66   Temp 97.9 F (36.6 C) (Oral)   Resp 14   Ht 1.6 m (_0 )   Wt 55.6 kg   SpO2 97%   BMI 21.71 kg/m  Physical Exam Vitals and nursing note reviewed.  Constitutional:      General: She is not in acute distress.    Appearance: She is  ill-appearing.  HENT:     Head: Normocephalic.     Right Ear: External ear normal.     Left Ear: External ear normal.     Nose: Nose normal.     Mouth/Throat:     Mouth: Mucous membranes are moist.     Comments: Left tongue hemangioma Eyes:     Extraocular Movements: Extraocular movements intact.     Pupils: Pupils are equal, round, and reactive to light.  Cardiovascular:     Rate and Rhythm: Normal rate and regular rhythm.     Pulses: Normal pulses.     Heart sounds: Normal heart sounds.  Pulmonary:     Effort: Pulmonary effort is normal.     Breath sounds: Normal breath sounds.  Abdominal:     General: Abdomen is flat. Bowel sounds are normal.  Musculoskeletal:        General: Swelling present.     Cervical back: Normal range of motion.     Comments: Swelling and redness to left lower leg from knee down to ankle  Skin:    General: Skin is warm and dry.     Capillary Refill: Capillary refill takes less than 2 seconds.  Neurological:     Mental  Status: She is alert.     Comments: Patient does not appear to move face but there is no asymmetry Extraocular movements are intact No tongue deviation She is unable to lift her right arm against gravity She is able to move her right leg against gravity but is able able to hold up She is unable to move her left leg for move but this appears to be due to pain She has 4 out of 5 strength in her left upper extremity which is stronger than her right upper extremity.  Psychiatric:        Mood and Affect: Mood normal.        Behavior: Behavior normal.     ED Results / Procedures / Treatments   Labs (all labs ordered are listed, but only abnormal results are displayed) Labs Reviewed  PROTIME-INR - Abnormal; Notable for the following components:      Result Value   Prothrombin Time 18.6 (*)    INR 1.6 (*)    All other components within normal limits  CBC - Abnormal; Notable for the following components:   RBC 2.79 (*)    Hemoglobin 8.7 (*)    HCT 29.3 (*)    MCV 105.0 (*)    MCHC 29.7 (*)    RDW 17.1 (*)    All other components within normal limits  COMPREHENSIVE METABOLIC PANEL - Abnormal; Notable for the following components:   Potassium 5.9 (*)    Chloride 120 (*)    CO2 17 (*)    BUN 40 (*)    Creatinine, Ser 1.95 (*)    Calcium 8.2 (*)    Total Protein 6.4 (*)    Albumin 3.0 (*)    GFR, Estimated 26 (*)    All other components within normal limits  APTT  DIFFERENTIAL  ETHANOL  CBG MONITORING, ED  POC OCCULT BLOOD, ED    EKG EKG Interpretation  Date/Time:  Thursday February 11 2022 09:43:25 EDT Ventricular Rate:  68 PR Interval:  226 QRS Duration: 116 QT Interval:  405 QTC Calculation: 431 R Axis:   0 Text Interpretation: Sinus rhythm Prolonged PR interval Incomplete right bundle branch block Anterior infarct, old No old tracing to compare Confirmed by Pattricia Boss (  [id number]) on 02/11/2022 12:28:34 PM  Radiology MR BRAIN WO CONTRAST  Result Date:  02/11/2022 CLINICAL DATA:  Neuro deficit, acute, stroke suspected EXAM: MRI HEAD WITHOUT CONTRAST MRA HEAD WITHOUT CONTRAST MRA NECK WITHOUT CONTRAST TECHNIQUE: Multiplanar, multiecho pulse sequences of the brain and surrounding structures were obtained without intravenous contrast. Angiographic images of the Circle of Willis were obtained using MRA technique without intravenous contrast. Angiographic images of the neck were obtained using MRA technique without intravenous contrast. Carotid stenosis measurements (when applicable) are obtained utilizing NASCET criteria, using the distal internal carotid diameter as the denominator. COMPARISON:  None Available. FINDINGS: MRI HEAD Brain: There is no acute infarction or intracranial hemorrhage. There is no intracranial mass, mass effect, or edema. There is no hydrocephalus or extra-axial fluid collection. Patchy and confluent areas of T2 hyperintensity in the supratentorial white matter are nonspecific may reflect mild to moderate chronic microvascular ischemic changes. A punctate focus of susceptibility in the dorsal pons likely reflects chronic microhemorrhage. Prominence of the ventricles and sulci reflects minor parenchymal volume loss. Vascular: Major vessel flow voids at the skull base are preserved. Skull and upper cervical spine: Normal marrow signal is preserved. Sinuses/Orbits: Minor mucosal thickening. Bilateral lens replacements. Other: Sella is unremarkable.  Mastoid air cells are clear. MRA HEAD Intracranial internal carotid arteries are patent. Middle and anterior cerebral arteries are patent. Intracranial vertebral arteries, basilar artery, posterior cerebral arteries are patent. Right posterior communicating artery is present. Possible diminutive left posterior communicating artery also noted. There is no significant stenosis or aneurysm. MRA NECK Common, internal, and external carotid arteries are patent. Codominant vertebral arteries are patent. No  hemodynamically significant stenosis or evidence of dissection. IMPRESSION: No acute infarction, hemorrhage, or mass. Mild to moderate chronic microvascular ischemic changes. No large vessel occlusion or hemodynamically significant stenosis. Electronically Signed   By: Macy Mis M.D.   On: 02/11/2022 12:00   MR ANGIO HEAD WO CONTRAST  Result Date: 02/11/2022 CLINICAL DATA:  Neuro deficit, acute, stroke suspected EXAM: MRI HEAD WITHOUT CONTRAST MRA HEAD WITHOUT CONTRAST MRA NECK WITHOUT CONTRAST TECHNIQUE: Multiplanar, multiecho pulse sequences of the brain and surrounding structures were obtained without intravenous contrast. Angiographic images of the Circle of Willis were obtained using MRA technique without intravenous contrast. Angiographic images of the neck were obtained using MRA technique without intravenous contrast. Carotid stenosis measurements (when applicable) are obtained utilizing NASCET criteria, using the distal internal carotid diameter as the denominator. COMPARISON:  None Available. FINDINGS: MRI HEAD Brain: There is no acute infarction or intracranial hemorrhage. There is no intracranial mass, mass effect, or edema. There is no hydrocephalus or extra-axial fluid collection. Patchy and confluent areas of T2 hyperintensity in the supratentorial white matter are nonspecific may reflect mild to moderate chronic microvascular ischemic changes. A punctate focus of susceptibility in the dorsal pons likely reflects chronic microhemorrhage. Prominence of the ventricles and sulci reflects minor parenchymal volume loss. Vascular: Major vessel flow voids at the skull base are preserved. Skull and upper cervical spine: Normal marrow signal is preserved. Sinuses/Orbits: Minor mucosal thickening. Bilateral lens replacements. Other: Sella is unremarkable.  Mastoid air cells are clear. MRA HEAD Intracranial internal carotid arteries are patent. Middle and anterior cerebral arteries are patent.  Intracranial vertebral arteries, basilar artery, posterior cerebral arteries are patent. Right posterior communicating artery is present. Possible diminutive left posterior communicating artery also noted. There is no significant stenosis or aneurysm. MRA NECK Common, internal, and external carotid arteries are patent. Codominant vertebral arteries are patent. No  hemodynamically significant stenosis or evidence of dissection. IMPRESSION: No acute infarction, hemorrhage, or mass. Mild to moderate chronic microvascular ischemic changes. No large vessel occlusion or hemodynamically significant stenosis. Electronically Signed   By: Macy Mis M.D.   On: 02/11/2022 12:00   MR ANGIO NECK WO CONTRAST  Result Date: 02/11/2022 CLINICAL DATA:  Neuro deficit, acute, stroke suspected EXAM: MRI HEAD WITHOUT CONTRAST MRA HEAD WITHOUT CONTRAST MRA NECK WITHOUT CONTRAST TECHNIQUE: Multiplanar, multiecho pulse sequences of the brain and surrounding structures were obtained without intravenous contrast. Angiographic images of the Circle of Willis were obtained using MRA technique without intravenous contrast. Angiographic images of the neck were obtained using MRA technique without intravenous contrast. Carotid stenosis measurements (when applicable) are obtained utilizing NASCET criteria, using the distal internal carotid diameter as the denominator. COMPARISON:  None Available. FINDINGS: MRI HEAD Brain: There is no acute infarction or intracranial hemorrhage. There is no intracranial mass, mass effect, or edema. There is no hydrocephalus or extra-axial fluid collection. Patchy and confluent areas of T2 hyperintensity in the supratentorial white matter are nonspecific may reflect mild to moderate chronic microvascular ischemic changes. A punctate focus of susceptibility in the dorsal pons likely reflects chronic microhemorrhage. Prominence of the ventricles and sulci reflects minor parenchymal volume loss. Vascular: Major  vessel flow voids at the skull base are preserved. Skull and upper cervical spine: Normal marrow signal is preserved. Sinuses/Orbits: Minor mucosal thickening. Bilateral lens replacements. Other: Sella is unremarkable.  Mastoid air cells are clear. MRA HEAD Intracranial internal carotid arteries are patent. Middle and anterior cerebral arteries are patent. Intracranial vertebral arteries, basilar artery, posterior cerebral arteries are patent. Right posterior communicating artery is present. Possible diminutive left posterior communicating artery also noted. There is no significant stenosis or aneurysm. MRA NECK Common, internal, and external carotid arteries are patent. Codominant vertebral arteries are patent. No hemodynamically significant stenosis or evidence of dissection. IMPRESSION: No acute infarction, hemorrhage, or mass. Mild to moderate chronic microvascular ischemic changes. No large vessel occlusion or hemodynamically significant stenosis. Electronically Signed   By: Macy Mis M.D.   On: 02/11/2022 12:00   CT HEAD CODE STROKE WO CONTRAST  Result Date: 02/11/2022 CLINICAL DATA:  Code stroke.  Neuro deficit, acute, stroke suspected EXAM: CT HEAD WITHOUT CONTRAST TECHNIQUE: Contiguous axial images were obtained from the base of the skull through the vertex without intravenous contrast. RADIATION DOSE REDUCTION: This exam was performed according to the departmental dose-optimization program which includes automated exposure control, adjustment of the mA and/or kV according to patient size and/or use of iterative reconstruction technique. COMPARISON:  December 2022 FINDINGS: Brain: No acute intracranial hemorrhage, mass effect, or edema. No new loss of gray-white differentiation. Patchy hypoattenuation in the supratentorial white matter is nonspecific but may reflect similar chronic microvascular ischemic changes. Low density within the pons is likely artifactual. There is no extra-axial collection.  Vascular: No hyperdense vessel. Skull: Unremarkable. Sinuses/Orbits: Patchy paranasal sinus mucosal thickening. Bilateral lens replacements. Other: Mastoid air cells are clear. ASPECTS ([place] Stroke Program Early CT Score) - Ganglionic level infarction (caudate, lentiform nuclei, internal capsule, insula, M1-M3 cortex): 7 - Supraganglionic infarction (M4-M6 cortex): 3 Total score (0-10 with 10 being normal): 10 IMPRESSION: There is no acute intracranial hemorrhage or evidence of acute infarction. ASPECT score is 10. These results were communicated to Dr. Theda Sers at 9:43 am on 02/11/2022 by text page via the [facility] messaging system. Electronically Signed   By: Macy Mis M.D.   On: 02/11/2022 09:44  Procedures Procedures    Medications Ordered in ED Medications  sodium chloride flush (NS) 0.9 % injection 3 mL (3 mLs Intravenous Given 02/11/22 1002)  sodium chloride 0.9 % bolus 500 mL (500 mLs Intravenous New Bag/Given 02/11/22 1307)    ED Course/ Medical Decision Making/ A&P Clinical Course as of 02/11/22 1334  Thu Feb 11, 2022  1216 C-Met reviewed and interpreted with hyperkalemia with potassium 5.9 Will give IV hydration and will need to be rechecked  [DR]  1217 Sodium reviewed and normal Chloride increased at 120 CO2: Decreased at 17  [DR]  1217 Creatinine(!): 1.95 [DR]  1218 Creatinine is increased at 1.95 but has previously been trending up with last noted 1.8 [DR]  1218 Protime-INR(!) INR subtherapeutic at 1.6  [DR]  1218 CBC(!) CBC reviewed interpreted and anemia noted with hemoglobin of 8.7 [DR]  1218 First prior hemoglobin 11 and Hemoccult pending [DR]  1219 MRI resulted and no evidence of acute infarction hemorrhage or mass [DR]    Clinical Course User Index [DR] Pattricia Boss, MD                           Medical Decision Making 81 year old female presents with new right-sided weakness with some baseline left-sided weakness. She is on Coumadin Patient has history  of seizure disorder Differential diagnosis includes, but is not limited to, stroke, intracerebral hemorrhage, seizure with postictal phase, hypoglycemia, other acute metabolic abnormalities CT of head revealed no evidence of acute intracranial abnormality. Due to patient's use of Coumadin, she is not a candidate for acute TNK per stroke team They advised to continue work-up and admit to hospitalist for ongoing work-up. 1- acute neuro deficit-r>l-no evidence of acute stroke consider TIA, please see neurology's note This could have nerve represented a postictal episode.  Discussed symptoms with Dr. Per history patient has had seizure in the past.  Daughter states she thinks she used to be on Topamax in the that has been stopped but unclear timeframe 2-multiple metabolic abnormalities including hyperkalemia which may be drug related, patient receiving IV fluids and will need recheck, hypochloremia 3 chronic renal insufficiency appears stable 4 anemia with decreased hemoglobin from 11-8, Hemoccult pending 5 patient on new chronic anticoagulation for left lower extremity DVT, consider infection-, afebrile, no leukocytosis noted 6 patient on chronic anticoagulation for reported DVT   Amount and/or Complexity of Data Reviewed Labs: ordered. Decision-making details documented in ED Course. Radiology: ordered. Discussion of management or test interpretation with external provider(s): Discussed care with Dr. Theda Sers call them on for neurology Discussed with Dr. Francine Graven on for hospitalist.  She will see for admission  Risk Parenteral controlled substances. Decision regarding hospitalization. Decision not to resuscitate or to de-escalate care because of poor prognosis.  Critical Care Total time providing critical care: 60 minutes           Final Clinical Impression(s) / ED Diagnoses Final diagnoses:  TIA (transient ischemic attack)  Anemia, unspecified type    Rx / DC Orders ED  Discharge Orders     None         Pattricia Boss, MD 02/11/22 1334

## 2022-02-11 NOTE — Assessment & Plan Note (Signed)
Noted to have slight worsening of her renal function Monitor closely during this hospitalization

## 2022-02-11 NOTE — ED Notes (Signed)
ED TO INPATIENT HANDOFF REPORT  ED Nurse Name and Phone #: Caryl Pina RN [phone]  S Name/Age/Gender Gloria Mckay 81 y.o. female Room/Bed: 034C/034C  Code Status   Code Status: DNR  Home/SNF/Other Rehab Patient oriented to: self, place, time, and situation Is this baseline? Yes   Triage Complete: Triage complete  Chief Complaint TIA (transient ischemic attack) [G45.9]  Triage Note Pt coming from [facility] rehab facility via [place]. Pts breakfast tray was delivered at 0800 and facility reports pt was at baseline. Pts daughter called her at 0830 and contacted the facility to check on her due to slurred speech. Facility reported at 830 she had slurred speech, facial droop, right sided weakness.   BP 160/70 HR 62.RR 16 94% RA  CBG 99   Allergies Allergies  Allergen Reactions   Avocado Shortness Of Breath   Benadryl [Diphenhydramine] Palpitations    Affected heart rate ER told her not to take again   Cephalosporins Anaphylaxis   Latex Anaphylaxis, Shortness Of Breath and Rash    Airway swelling   Other Rash    Fluoroquinolones - pt prefers not to take after breaking out in a rash after taking ciprofloxacin.   Valium [Diazepam] Shortness Of Breath and Other (See Comments)    Hyperventilation    Zinacef [Cefuroxime] Anaphylaxis   Methylpyrrolidone Hives   Norvasc [Amlodipine] Cough   Penicillins Hives and Swelling    Most mycin drugs   Quinolones Other (See Comments)    Joint pain   Roxicodone [Oxycodone] Hives and Nausea Only   Stadol [Butorphanol] Hives   Sulfa Antibiotics Hives and Swelling   Tomato Other (See Comments)    Mouth sores   Voltaren [Diclofenac] Hives    Pt able to use diclofenac gel 1%   Zofran [Ondansetron] Hives and Other (See Comments)    Sedation   Altace [Ramipril] Cough   Iodine I 131 Tositumomab Other (See Comments)    Listed on MAR as allergy Unknown reaction   Lodine [Etodolac] Other (See Comments)    Listed on MAR as allergy Unknown  reaction   Nexium [Esomeprazole Magnesium] Diarrhea and Nausea And Vomiting   Protonix [Pantoprazole] Diarrhea   Zestril [Lisinopril] Cough   Ace Inhibitors Other (See Comments) and Cough    Lisinopril and ramipril    Cipro [Ciprofloxacin Hcl] Rash   Codeine Nausea Only    Level of Care/Admitting Diagnosis ED Disposition     ED Disposition  Admit   Condition  --   Comment  Hospital Area: [facility] [[id number]]  Level of Care: Telemetry Medical [104]  May admit patient to [facility] or [facility] if equivalent level of care is available:: Yes  Covid Evaluation: Asymptomatic - no recent exposure (last 10 days) testing not required  Diagnosis: TIA (transient ischemic attack) [903009]  Admitting Physician: Gary Fleet  Attending Physician: Gary Fleet  Estimated length of stay: past midnight tomorrow  Certification:: I certify this patient will need inpatient services for at least 2 midnights          B Medical/Surgery History Past Medical History:  Diagnosis Date   Arthritis    Cancer ([place])    Cataract    bilaterally   Hypertension    Myelodysplasia (myelodysplastic syndrome) ([place])    Myelodysplastic syndrome ([place])    Past Surgical History:  Procedure Laterality Date   JOINT REPLACEMENT     STOMACH SURGERY       A IV Location/Drains/Wounds Patient Lines/Drains/Airways Status  Active Line/Drains/Airways     Name Placement date Placement time Site Days   Peripheral IV 02/11/22 22 G Left Antecubital 02/11/22  1002  Antecubital  less than 1            Intake/Output Last 24 hours No intake or output data in the 24 hours ending 02/11/22 1535  Labs/Imaging Results for orders placed or performed during the hospital encounter of 02/11/22 (from the past 48 hour(s))  CBG monitoring, ED     Status: None   Collection Time: 02/11/22  9:21 AM  Result Value Ref Range   Glucose-Capillary 77 70 - 99 mg/dL     Comment: Glucose reference range applies only to samples taken after fasting for at least 8 hours.   Comment 1 Document in Chart   Protime-INR     Status: Abnormal   Collection Time: 02/11/22 10:00 AM  Result Value Ref Range   Prothrombin Time 18.6 (H) 11.4 - 15.2 seconds   INR 1.6 (H) 0.8 - 1.2    Comment: (NOTE) INR goal varies based on device and disease states. Performed at [facility], [place] [address], [place], [place] [address]   APTT     Status: None   Collection Time: 02/11/22 10:00 AM  Result Value Ref Range   aPTT 36 24 - 36 seconds    Comment: Performed at [facility] [address], [place], [place] [address]  CBC     Status: Abnormal   Collection Time: 02/11/22 10:00 AM  Result Value Ref Range   WBC 4.4 4.0 - 10.5 K/uL   RBC 2.79 (L) 3.87 - 5.11 MIL/uL   Hemoglobin 8.7 (L) 12.0 - 15.0 g/dL   HCT 29.3 (L) 36.0 - 46.0 %   MCV 105.0 (H) 80.0 - 100.0 fL   MCH 31.2 26.0 - 34.0 pg   MCHC 29.7 (L) 30.0 - 36.0 g/dL   RDW 17.1 (H) 11.5 - 15.5 %   Platelets 192 150 - 400 K/uL   nRBC 0.0 0.0 - 0.2 %    Comment: Performed at [facility] [address] [place], [place] [address]  Differential     Status: None   Collection Time: 02/11/22 10:00 AM  Result Value Ref Range   Neutrophils Relative % 42 %   Neutro Abs 1.8 1.7 - 7.7 K/uL   Lymphocytes Relative 41 %   Lymphs Abs 1.8 0.7 - 4.0 K/uL   Monocytes Relative 11 %   Monocytes Absolute 0.5 0.1 - 1.0 K/uL   Eosinophils Relative 5 %   Eosinophils Absolute 0.2 0.0 - 0.5 K/uL   Basophils Relative 1 %   Basophils Absolute 0.0 0.0 - 0.1 K/uL   Immature Granulocytes 0 %   Abs Immature Granulocytes 0.01 0.00 - 0.07 K/uL    Comment: Performed at [facility] [address] [place],  [address]  Comprehensive metabolic panel     Status: Abnormal   Collection Time: 02/11/22 10:00 AM  Result Value Ref Range   Sodium 142 135 - 145 mmol/L   Potassium 5.9 (H) 3.5 - 5.1 mmol/L   Chloride 120 (H)  98 - 111 mmol/L   CO2 17 (L) 22 - 32 mmol/L   Glucose, Bld 91 70 - 99 mg/dL    Comment: Glucose reference range applies only to samples taken after fasting for at least 8 hours.   BUN 40 (H) 8 - 23 mg/dL   Creatinine, Ser 1.95 (H)  0.44 - 1.00 mg/dL   Calcium 8.2 (L) 8.9 - 10.3 mg/dL   Total Protein 6.4 (L) 6.5 - 8.1 g/dL   Albumin 3.0 (L) 3.5 - 5.0 g/dL   AST 24 15 - 41 U/L   ALT 23 0 - 44 U/L   Alkaline Phosphatase 121 38 - 126 U/L   Total Bilirubin 0.5 0.3 - 1.2 mg/dL   GFR, Estimated 26 (L) >60 mL/min    Comment: (NOTE) Calculated using the CKD-EPI Creatinine Equation (2021)    Anion gap 5 5 - 15    Comment: Performed at [facility] [address], [place], [place] [address]  Ethanol     Status: None   Collection Time: 02/11/22 10:00 AM  Result Value Ref Range   Alcohol, Ethyl (B) <10 <10 mg/dL    Comment: (NOTE) Lowest detectable limit for serum alcohol is 10 mg/dL.  For medical purposes only. Performed at [facility], [place] [address] [place], [place] [address]   POC occult blood, ED RN will collect     Status: None   Collection Time: 02/11/22 12:39 PM  Result Value Ref Range   Fecal Occult Bld NEGATIVE NEGATIVE  Potassium     Status: Abnormal   Collection Time: 02/11/22  2:26 PM  Result Value Ref Range   Potassium 5.6 (H) 3.5 - 5.1 mmol/L    Comment: Performed at [facility], [place] [address] [place], [place] [address]  CBG monitoring, ED     Status: None   Collection Time: 02/11/22  3:23 PM  Result Value Ref Range   Glucose-Capillary 87 70 - 99 mg/dL    Comment: Glucose reference range applies only to samples taken after fasting for at least 8 hours.   MR BRAIN WO CONTRAST  Result Date: 02/11/2022 CLINICAL DATA:  Neuro deficit, acute, stroke suspected EXAM: MRI HEAD WITHOUT CONTRAST MRA HEAD WITHOUT CONTRAST MRA NECK WITHOUT CONTRAST TECHNIQUE: Multiplanar, multiecho pulse sequences of the brain and surrounding structures were obtained without  intravenous contrast. Angiographic images of the Circle of Willis were obtained using MRA technique without intravenous contrast. Angiographic images of the neck were obtained using MRA technique without intravenous contrast. Carotid stenosis measurements (when applicable) are obtained utilizing NASCET criteria, using the distal internal carotid diameter as the denominator. COMPARISON:  None Available. FINDINGS: MRI HEAD Brain: There is no acute infarction or intracranial hemorrhage. There is no intracranial mass, mass effect, or edema. There is no hydrocephalus or extra-axial fluid collection. Patchy and confluent areas of T2 hyperintensity in the supratentorial white matter are nonspecific may reflect mild to moderate chronic microvascular ischemic changes. A punctate focus of susceptibility in the dorsal pons likely reflects chronic microhemorrhage. Prominence of the ventricles and sulci reflects minor parenchymal volume loss. Vascular: Major vessel flow voids at the skull base are preserved. Skull and upper cervical spine: Normal marrow signal is preserved. Sinuses/Orbits: Minor mucosal thickening. Bilateral lens replacements. Other: Sella is unremarkable.  Mastoid air cells are clear. MRA HEAD Intracranial internal carotid arteries are patent. Middle and anterior cerebral arteries are patent. Intracranial vertebral arteries, basilar artery, posterior cerebral arteries are patent. Right posterior communicating artery is present. Possible diminutive left posterior communicating artery also noted. There is no significant stenosis or aneurysm. MRA NECK Common, internal, and external carotid arteries are patent. Codominant vertebral arteries are patent. No hemodynamically significant stenosis or evidence of dissection. IMPRESSION: No acute infarction, hemorrhage, or mass. Mild to moderate chronic microvascular ischemic changes. No  large vessel occlusion or hemodynamically significant stenosis. Electronically Signed    By: Macy Mis M.D.   On: 02/11/2022 12:00   MR ANGIO HEAD WO CONTRAST  Result Date: 02/11/2022 CLINICAL DATA:  Neuro deficit, acute, stroke suspected EXAM: MRI HEAD WITHOUT CONTRAST MRA HEAD WITHOUT CONTRAST MRA NECK WITHOUT CONTRAST TECHNIQUE: Multiplanar, multiecho pulse sequences of the brain and surrounding structures were obtained without intravenous contrast. Angiographic images of the Circle of Willis were obtained using MRA technique without intravenous contrast. Angiographic images of the neck were obtained using MRA technique without intravenous contrast. Carotid stenosis measurements (when applicable) are obtained utilizing NASCET criteria, using the distal internal carotid diameter as the denominator. COMPARISON:  None Available. FINDINGS: MRI HEAD Brain: There is no acute infarction or intracranial hemorrhage. There is no intracranial mass, mass effect, or edema. There is no hydrocephalus or extra-axial fluid collection. Patchy and confluent areas of T2 hyperintensity in the supratentorial white matter are nonspecific may reflect mild to moderate chronic microvascular ischemic changes. A punctate focus of susceptibility in the dorsal pons likely reflects chronic microhemorrhage. Prominence of the ventricles and sulci reflects minor parenchymal volume loss. Vascular: Major vessel flow voids at the skull base are preserved. Skull and upper cervical spine: Normal marrow signal is preserved. Sinuses/Orbits: Minor mucosal thickening. Bilateral lens replacements. Other: Sella is unremarkable.  Mastoid air cells are clear. MRA HEAD Intracranial internal carotid arteries are patent. Middle and anterior cerebral arteries are patent. Intracranial vertebral arteries, basilar artery, posterior cerebral arteries are patent. Right posterior communicating artery is present. Possible diminutive left posterior communicating artery also noted. There is no significant stenosis or aneurysm. MRA NECK Common,  internal, and external carotid arteries are patent. Codominant vertebral arteries are patent. No hemodynamically significant stenosis or evidence of dissection. IMPRESSION: No acute infarction, hemorrhage, or mass. Mild to moderate chronic microvascular ischemic changes. No large vessel occlusion or hemodynamically significant stenosis. Electronically Signed   By: Macy Mis M.D.   On: 02/11/2022 12:00   MR ANGIO NECK WO CONTRAST  Result Date: 02/11/2022 CLINICAL DATA:  Neuro deficit, acute, stroke suspected EXAM: MRI HEAD WITHOUT CONTRAST MRA HEAD WITHOUT CONTRAST MRA NECK WITHOUT CONTRAST TECHNIQUE: Multiplanar, multiecho pulse sequences of the brain and surrounding structures were obtained without intravenous contrast. Angiographic images of the Circle of Willis were obtained using MRA technique without intravenous contrast. Angiographic images of the neck were obtained using MRA technique without intravenous contrast. Carotid stenosis measurements (when applicable) are obtained utilizing NASCET criteria, using the distal internal carotid diameter as the denominator. COMPARISON:  None Available. FINDINGS: MRI HEAD Brain: There is no acute infarction or intracranial hemorrhage. There is no intracranial mass, mass effect, or edema. There is no hydrocephalus or extra-axial fluid collection. Patchy and confluent areas of T2 hyperintensity in the supratentorial white matter are nonspecific may reflect mild to moderate chronic microvascular ischemic changes. A punctate focus of susceptibility in the dorsal pons likely reflects chronic microhemorrhage. Prominence of the ventricles and sulci reflects minor parenchymal volume loss. Vascular: Major vessel flow voids at the skull base are preserved. Skull and upper cervical spine: Normal marrow signal is preserved. Sinuses/Orbits: Minor mucosal thickening. Bilateral lens replacements. Other: Sella is unremarkable.  Mastoid air cells are clear. MRA HEAD Intracranial  internal carotid arteries are patent. Middle and anterior cerebral arteries are patent. Intracranial vertebral arteries, basilar artery, posterior cerebral arteries are patent. Right posterior communicating artery is present. Possible diminutive left posterior communicating artery also noted. There is no significant stenosis or  aneurysm. MRA NECK Common, internal, and external carotid arteries are patent. Codominant vertebral arteries are patent. No hemodynamically significant stenosis or evidence of dissection. IMPRESSION: No acute infarction, hemorrhage, or mass. Mild to moderate chronic microvascular ischemic changes. No large vessel occlusion or hemodynamically significant stenosis. Electronically Signed   By: Macy Mis M.D.   On: 02/11/2022 12:00   CT HEAD CODE STROKE WO CONTRAST  Result Date: 02/11/2022 CLINICAL DATA:  Code stroke.  Neuro deficit, acute, stroke suspected EXAM: CT HEAD WITHOUT CONTRAST TECHNIQUE: Contiguous axial images were obtained from the base of the skull through the vertex without intravenous contrast. RADIATION DOSE REDUCTION: This exam was performed according to the departmental dose-optimization program which includes automated exposure control, adjustment of the mA and/or kV according to patient size and/or use of iterative reconstruction technique. COMPARISON:  December 2022 FINDINGS: Brain: No acute intracranial hemorrhage, mass effect, or edema. No new loss of gray-white differentiation. Patchy hypoattenuation in the supratentorial white matter is nonspecific but may reflect similar chronic microvascular ischemic changes. Low density within the pons is likely artifactual. There is no extra-axial collection. Vascular: No hyperdense vessel. Skull: Unremarkable. Sinuses/Orbits: Patchy paranasal sinus mucosal thickening. Bilateral lens replacements. Other: Mastoid air cells are clear. ASPECTS ([place] Stroke Program Early CT Score) - Ganglionic level infarction (caudate,  lentiform nuclei, internal capsule, insula, M1-M3 cortex): 7 - Supraganglionic infarction (M4-M6 cortex): 3 Total score (0-10 with 10 being normal): 10 IMPRESSION: There is no acute intracranial hemorrhage or evidence of acute infarction. ASPECT score is 10. These results were communicated to Dr. Theda Sers at 9:43 am on 02/11/2022 by text page via the [facility] messaging system. Electronically Signed   By: Macy Mis M.D.   On: 02/11/2022 09:44    Pending Labs Unresulted Labs (From admission, onward)     Start     Ordered   02/12/22 0500  Hemoglobin A1c  Tomorrow morning,   R        02/11/22 1001   02/12/22 0500  Lipid panel  (Labs)  Tomorrow morning,   R       Comments: Fasting    02/11/22 1403   02/12/22 0500  Protime-INR  Daily,   R      02/11/22 1419   02/12/22 0500  CBC  Daily,   R      02/11/22 1419   02/11/22 1406  Potassium  STAT Now then every 4 hours ,   R (with STAT occurrences)     Comments: Until normal twice.    02/11/22 1405            Vitals/Pain Today's Vitals   02/11/22 1345 02/11/22 1400 02/11/22 1415 02/11/22 1430  BP: (!) 149/84 (!) 144/74 139/77 (!) 154/83  Pulse: 73 70 67 69  Resp: '17 15 19 18  '$ Temp:      TempSrc:      SpO2: 99% 99% 98% 97%  Weight:      Height:      PainSc:        Isolation Precautions No active isolations  Medications Medications  acetaminophen (TYLENOL) tablet 500 mg (has no administration in time range)  allopurinol (ZYLOPRIM) tablet 100 mg (has no administration in time range)  escitalopram (LEXAPRO) tablet 5 mg (has no administration in time range)  pantoprazole (PROTONIX) EC tablet 40 mg (40 mg Oral Given 02/11/22 1525)  Inulin CHEW 5 g (has no administration in time range)  sodium bicarbonate tablet 650 mg (has no administration in time range)  estradiol (ESTRACE) vaginal cream 1 g (has no administration in time range)  Cyanocobalamin CAPS 5,000 mcg (has no administration in time range)  carvedilol (COREG) tablet 25 mg  (has no administration in time range)   stroke: early stages of recovery book (has no administration in time range)  acetaminophen (TYLENOL) tablet 650 mg (has no administration in time range)    Or  acetaminophen (TYLENOL) 160 MG/5ML solution 650 mg (has no administration in time range)    Or  acetaminophen (TYLENOL) suppository 650 mg (has no administration in time range)  sodium zirconium cyclosilicate (LOKELMA) packet 10 g (10 g Oral Given 02/11/22 1525)  warfarin (COUMADIN) tablet 5 mg (has no administration in time range)  Warfarin - Pharmacist Dosing Inpatient (has no administration in time range)  sodium chloride flush (NS) 0.9 % injection 3 mL (3 mLs Intravenous Given 02/11/22 1002)  sodium chloride 0.9 % bolus 500 mL (0 mLs Intravenous Stopped 02/11/22 1342)  insulin aspart (novoLOG) injection 5 Units (5 Units Intravenous Given 02/11/22 1525)    And  dextrose 50 % solution 50 mL (50 mLs Intravenous Given 02/11/22 1525)    Mobility walks with device High fall risk   Focused Assessments Neuro Assessment Handoff:  Swallow screen pass? Yes  Cardiac Rhythm: Normal sinus rhythm NIH Stroke Scale ( + Modified Stroke Scale Criteria)  Interval: Initial Level of Consciousness (1a.)   : Alert, keenly responsive LOC Questions (1b. )   +: Answers both questions correctly LOC Commands (1c. )   + : Performs both tasks correctly Best Gaze (2. )  +: Normal Visual (3. )  +: No visual loss Facial Palsy (4. )    : Minor paralysis Motor Arm, Left (5a. )   +: No drift Motor Arm, Right (5b. )   +: No drift Motor Leg, Left (6a. )   +: Some effort against gravity Motor Leg, Right (6b. )   +: No drift Limb Ataxia (7. ): Absent Sensory (8. )   +: Normal, no sensory loss Best Language (9. )   +: No aphasia Dysarthria (10. ): Normal Extinction/Inattention (11.)   +: No Abnormality Modified SS Total  +: 2 Complete NIHSS TOTAL: 7 Last date known well: 02/11/22 Last time known well: 0800 Neuro  Assessment: Exceptions to WDL Neuro Checks:   Initial (02/11/22 0930)  Last Documented NIHSS Modified Score: 2 (02/11/22 1400) Has TPA been given? No If patient is a Neuro Trauma and patient is going to OR before floor call report to [place] nurse: [phone] or [phone]   R Recommendations: See Admitting Provider Note  Report given to:   Additional Notes:

## 2022-02-11 NOTE — ED Triage Notes (Signed)
Pt coming from [facility] rehab facility via [place]. Pts breakfast tray was delivered at 0800 and facility reports pt was at baseline. Pts daughter called her at 0830 and contacted the facility to check on her due to slurred speech. Facility reported at 830 she had slurred speech, facial droop, right sided weakness.   BP 160/70 HR 62.RR 16 94% RA  CBG 99

## 2022-02-11 NOTE — Assessment & Plan Note (Signed)
Unclear etiology may be related to metabolic acidosis Patient not on an ACE or ARB and not receiving potassium supplements We will treat with dextrose, insulin and Lokelma Repeat potassium levels

## 2022-02-11 NOTE — H&P (Signed)
History and Physical    Patient: Gloria Mckay EHM:[id number] DOB: Sep 21, 1940 DOA: 02/11/2022 DOS: the patient was seen and examined on 02/11/2022 PCP: Pcp, No  Patient coming from: SNF  Chief Complaint:  Chief Complaint  Patient presents with   Code Stroke   HPI: Gloria Mckay is a 81 y.o. female with medical history significant for hypertension, myelodysplastic syndrome, stage IV chronic kidney disease, anemia of chronic disease, history of DVT who was brought into the ER from the skilled nursing facility where she resides by EMS for evaluation of slurred speech and right-sided weakness. Patient's last known well was about 8 AM on the day of admission when dropped off her breakfast.  At about 830 she called her daughter because her speech was slurred and she had right-sided weakness.  Her daughter was able to reach out to the nursing home staff who called EMS after they noticed the patient had right-sided weakness and a right facial droop. She was a code stroke upon arrival to the ER and had an NIH SS score of 7.  Initial CT scan of the head did not show an acute bleed.  Patient not a candidate for tPA because she is on Coumadin and not a candidate for IR because her symptoms were improving. During my evaluation she still has dysarthria but improved from presentation.  Her right upper extremity weakness is also improved as well as left leg weakness. She denies having any chest pain, no shortness of breath, no nausea, no vomiting, no headache, no difficulty swallowing, no blurred vision, no dizziness, no lightheadedness, no changes in her bowel habits, no urinary symptoms  Review of Systems: As mentioned in the history of present illness. All other systems reviewed and are negative. Past Medical History:  Diagnosis Date   Arthritis    Cancer ([place])    Cataract    bilaterally   Hypertension    Myelodysplasia (myelodysplastic syndrome) ([place])    Myelodysplastic syndrome ([place])    Past Surgical  History:  Procedure Laterality Date   JOINT REPLACEMENT     STOMACH SURGERY     Social History:  reports that she has never smoked. She has never used smokeless tobacco. She reports that she does not drink alcohol and does not use drugs.  Allergies  Allergen Reactions   Avocado Shortness Of Breath   Benadryl [Diphenhydramine] Palpitations    Affected heart rate ER told her not to take again   Cephalosporins Anaphylaxis   Latex Anaphylaxis, Shortness Of Breath and Rash    Airway swelling   Other Rash    Fluoroquinolones - pt prefers not to take after breaking out in a rash after taking ciprofloxacin.   Valium [Diazepam] Shortness Of Breath and Other (See Comments)    Hyperventilation    Zinacef [Cefuroxime] Anaphylaxis   Methylpyrrolidone Hives   Norvasc [Amlodipine] Cough   Penicillins Hives and Swelling    Most mycin drugs   Quinolones Other (See Comments)    Joint pain   Roxicodone [Oxycodone] Hives and Nausea Only   Stadol [Butorphanol] Hives   Sulfa Antibiotics Hives and Swelling   Tomato Other (See Comments)    Mouth sores   Voltaren [Diclofenac] Hives    Pt able to use diclofenac gel 1%   Zofran [Ondansetron] Hives and Other (See Comments)    Sedation   Altace [Ramipril] Cough   Iodine I 131 Tositumomab Other (See Comments)    Listed on MAR as allergy Unknown reaction   Lodine [Etodolac] Other (  See Comments)    Listed on MAR as allergy Unknown reaction   Nexium [Esomeprazole Magnesium] Diarrhea and Nausea And Vomiting   Protonix [Pantoprazole] Diarrhea   Zestril [Lisinopril] Cough   Ace Inhibitors Other (See Comments) and Cough    Lisinopril and ramipril    Cipro [Ciprofloxacin Hcl] Rash   Codeine Nausea Only    Family History  Problem Relation Age of Onset   Hypertension Mother    Pulmonary embolism Mother    Heart disease Father     Prior to Admission medications   Medication Sig Start Date End Date Taking? Authorizing Provider  acetaminophen  (TYLENOL) 500 MG tablet Take 500 mg by mouth in the morning.   Yes [provider]  allopurinol (ZYLOPRIM) 100 MG tablet Take 100 mg by mouth at bedtime.   Yes [provider]  amLODipine (NORVASC) 10 MG tablet Take 1 tablet (10 mg total) by mouth daily. Patient taking differently: Take 10 mg by mouth in the morning. 09/04/18  Yes Emeterio Reeve, DO  Calcium Carb-Cholecalciferol (CALCIUM 600+D) 600-10 MG-MCG TABS Take 1 tablet by mouth daily.   Yes [provider]  carvedilol (COREG) 25 MG tablet Take 1 tablet (25 mg total) by mouth 2 (two) times daily with a meal. 11/05/21  Yes Luetta Nutting, DO  Cyanocobalamin 5000 MCG CAPS Take 5,000 mcg by mouth in the morning.   Yes [provider]  diclofenac Sodium (VOLTAREN) 1 % GEL Apply 4 g topically every 12 (twelve) hours.   Yes [provider]  escitalopram (LEXAPRO) 5 MG tablet Take 5 mg by mouth at bedtime.   Yes [provider]  estradiol (ESTRACE) 0.1 MG/GM vaginal cream Place 1 g vaginally every Monday, Wednesday, and Friday.   Yes [provider]  Glycerin-Hypromellose-PEG 400 0.2-0.2-1 % SOLN Place 2 drops into both eyes 2 (two) times daily.   Yes [provider]  hydrALAZINE (APRESOLINE) 25 MG tablet Take 25 mg by mouth 3 (three) times daily.   Yes [provider]  Inulin (FIBERCHOICE) 2 g CHEW Chew 5 g by mouth daily. Listed on MAR as fiber gummies 2g (inulin) - 2 gummies for daily (for 5g of fiber per serving of 2)   Yes [provider]  loperamide (IMODIUM) 2 MG capsule Take 4 mg by mouth 3 (three) times daily before meals.   Yes [provider]  melatonin 3 MG TABS tablet Take 3 mg by mouth at bedtime.   Yes [provider]  meloxicam (MOBIC) 7.5 MG tablet Take 7.5 mg by mouth every other day.   Yes [provider]  pregabalin (LYRICA) 25 MG capsule Take 25 mg by mouth 2 (two) times daily.   Yes [provider]   sodium bicarbonate 650 MG tablet Take 650 mg by mouth 3 (three) times daily.   Yes [provider]  tiZANidine (ZANAFLEX) 2 MG tablet Take 2 mg by mouth at bedtime.   Yes [provider]  topiramate (TOPAMAX) 100 MG tablet Take 1.5 tabs (150 mg) in AM and 2 tabs (200 mg) in PM Patient taking differently: Take 100 mg by mouth 2 (two) times daily. 12/26/19  Yes Emeterio Reeve, DO  traMADol-acetaminophen (ULTRACET) 37.5-325 MG tablet Take 2 tablets by mouth at bedtime.   Yes [provider]  warfarin (COUMADIN) 2 MG tablet Take 2 mg by mouth every evening.   Yes [provider]  warfarin (COUMADIN) 2.5 MG tablet Take 2.5 mg by mouth every evening.  Yes [provider]  AMBULATORY NON FORMULARY MEDICATION Medication Name: rollator walker with seat per patient preference and insurance coverage 05/15/20   Emeterio Reeve, DO  Calcium-Vitamin D-Vitamin K [id number] MG-UNT-MCG CHEW Chew 2 tablets by mouth daily. Patient not taking: Reported on 02/11/2022 12/04/20   Emeterio Reeve, DO  cyanocobalamin (,VITAMIN B-12,) 1000 MCG/ML injection Inject 1 mL (1,000 mcg total) into the muscle every 30 (thirty) days. Please include syringes and needles as needed for monthly injections Patient not taking: Reported on 02/11/2022 02/15/20   Silverio Decamp, MD  denosumab (PROLIA) 60 MG/ML SOSY injection Inject 60 mg into the skin every 6 (six) months. Administer in upper arm, thigh, or abdomen Patient not taking: Reported on 02/11/2022 07/18/18   Emeterio Reeve, DO  esomeprazole (NEXIUM) 40 MG capsule Take 1 capsule (40 mg total) by mouth 2 (two) times daily before a meal. Patient not taking: Reported on 02/11/2022 05/02/20   Emeterio Reeve, DO  HYDROcodone-acetaminophen (NORCO/VICODIN) 5-325 MG tablet Take 1-2 tablets by mouth every 4 (four) hours as needed for moderate pain (while awake, maximum 5 tablets per 24 hours). Patient not taking: Reported on  02/11/2022 03/24/21 04/03/22  Emeterio Reeve, DO  hydrOXYzine (ATARAX/VISTARIL) 25 MG tablet Take 0.5-1 tablets (12.5-25 mg total) by mouth every 8 (eight) hours as needed for itching. Patient not taking: Reported on 02/11/2022 12/26/19   Emeterio Reeve, DO  losartan (COZAAR) 100 MG tablet Take 1 tablet (100 mg total) by mouth daily. Patient not taking: Reported on 02/11/2022 04/30/21   Emeterio Reeve, DO  naloxone [facility]) nasal spray 4 mg/0.1 mL use as directed intranasal  every 2 to 3 minutes until emergency team has arrived-- use in opioid emergency only Patient not taking: Reported on 02/11/2022 04/10/21   Emeterio Reeve, DO  warfarin (COUMADIN) 1 MG tablet Take 0.5 mg by mouth every evening. Patient not taking: Reported on 02/11/2022    [provider]  warfarin (COUMADIN) 5 MG tablet take 1 tablet daily, except Thursdays take 1/2 tablet Patient not taking: Reported on 02/11/2022 03/24/21   Emeterio Reeve, DO    Physical Exam: Vitals:   02/11/22 1315 02/11/22 1330 02/11/22 1345 02/11/22 1400  BP: (!) 127/116 (!) 132/104 (!) 149/84 (!) 144/74  Pulse: 72 69 73 70  Resp: '19 18 17 15  '$ Temp:      TempSrc:      SpO2: 100% 98% 99% 99%  Weight:      Height:       Physical Exam Vitals and nursing note reviewed.  Constitutional:      Appearance: Normal appearance.     Comments: Dysarthria present  HENT:     Head: Normocephalic and atraumatic.     Nose: Nose normal.     Mouth/Throat:     Mouth: Mucous membranes are moist.  Eyes:     Pupils: Pupils are equal, round, and reactive to light.  Cardiovascular:     Rate and Rhythm: Normal rate and regular rhythm.  Pulmonary:     Effort: Pulmonary effort is normal.     Breath sounds: Normal breath sounds.  Abdominal:     General: Abdomen is flat. Bowel sounds are normal.     Palpations: Abdomen is soft.  Musculoskeletal:     Cervical back: Normal range of motion and neck supple.     Left lower leg: Edema present.      Comments: Left leg redness and swelling (chronic)  Skin:    General: Skin is warm and  dry.  Neurological:     Mental Status: She is alert and oriented to person, place, and time.     Motor: Weakness present.     Comments: Able to move both upper extremities as well as right leg in bed.  Left leg is swollen and at baseline she has difficulty moving her left leg  Psychiatric:        Mood and Affect: Mood normal.        Behavior: Behavior normal.     Data Reviewed: Relevant notes from primary care and specialist visits, past discharge summaries as available in EHR, including Care Everywhere. Prior diagnostic testing as pertinent to current admission diagnoses Updated medications and problem lists for reconciliation ED course, including vitals, labs, imaging, treatment and response to treatment Triage notes, nursing and pharmacy notes and ED provider's notes Notable results as noted in HPI Fecal occult blood test is negative, sodium 142, potassium 5.9, chloride 120, bicarb 17, glucose 91, BUN 40, creatinine 1.95, calcium 8.2, total protein 6.4, albumin 3.0, AST 24, ALT 23, PT 18.6, INR 1.6, white count 4.4, hemoglobin 8.7 compared to baseline of 11.8, hematocrit 29.3, platelet count 192 CT scan of the head without contrast shows no acute intracranial hemorrhage or evidence of acute infarction. ASPECT score is 10. MRI/MRA of the brain shows no acute infarction, hemorrhage, or mass. Mild to moderate chronic microvascular ischemic changes. No large vessel occlusion or hemodynamically significant stenosis. Twelve-lead EKG reviewed by me shows sinus rhythm, prolonged PR interval, incomplete right bundle branch block There are no new results to review at this time.  Assessment and Plan: * TIA (transient ischemic attack) Patient presents to the hospital for evaluation of right-sided weakness and slurred speech that has improved but not completely resolved. She was admitted as a code stroke and  initial CT scan of the head was negative for bleed. Not a candidate for tPA since patient is on chronic anticoagulation with Coumadin and not a candidate for IR since symptoms appear to be resolving. MRI of the brain is negative for an acute infarct We will obtain 2D echocardiogram to assess LVEF and rule out cardiac thrombus We will request PT/OT/ST consult We will request neurology consult Start patient on high intensity statin   Normal anion gap metabolic acidosis Most likely secondary to chronic kidney disease stage IV Continue sodium bicarbonate 650 mg 3 times a day  Hyperkalemia Unclear etiology may be related to metabolic acidosis Patient not on an ACE or ARB and not receiving potassium supplements We will treat with dextrose, insulin and Lokelma Repeat potassium levels  CKD (chronic kidney disease) stage 4, GFR 15-29 ml/min (HCC) Noted to have slight worsening of her renal function Monitor closely during this hospitalization  Hypertension Continue carvedilol Hold amlodipine and hydralazine  Anemia of chronic disease H&H is 8.7g/dl compared to baseline of 11.8 g/dl from 11 months ago. FOBT is negative Worsening anemia may be related to chronic kidney disease Monitor closely during this hospitalization  History of DVT of lower extremity Continue Coumadin INR is subtherapeutic Pharmacy consult for Coumadin management during this hospitalization      Advance Care Planning:   Code Status: DNR   Consults: Neurology  Family Communication: Greater than 50% of time was spent discussing patient's condition and plan of care with her and her daughter at the bedside.  All questions and concerns have been addressed.  They verbalized understanding and agree with the plan.  CODE STATUS was discussed and patient is a DNR  Severity of Illness: The appropriate patient status for this patient is INPATIENT. Inpatient status is judged to be reasonable and necessary in order to  provide the required intensity of service to ensure the patient's safety. The patient's presenting symptoms, physical exam findings, and initial radiographic and laboratory data in the context of their chronic comorbidities is felt to place them at high risk for further clinical deterioration. Furthermore, it is not anticipated that the patient will be medically stable for discharge from the hospital within 2 midnights of admission.   * I certify that at the point of admission it is my clinical judgment that the patient will require inpatient hospital care spanning beyond 2 midnights from the point of admission due to high intensity of service, high risk for further deterioration and high frequency of surveillance required.*  Author: Collier Bullock, MD 02/11/2022 2:36 PM  For on call review [email or web address].

## 2022-02-11 NOTE — ED Notes (Signed)
Pt transported to MRI 

## 2022-02-11 NOTE — Progress Notes (Signed)
EEG complete - results pending 

## 2022-02-11 NOTE — Consult Note (Addendum)
Neurology Consult H&P  Gloria Mckay MR# [id number] 02/11/2022   CC: right sided weakness, right facial droop, dysarthria  History is obtained from: patient and chart.  HPI: Gloria Mckay is a 81 y.o. female PMHx as reviewed below who presents from a facility.  Her last known well time was 0800 when she was given her breakfast.  At 0830, she spoke on the phone to her daughter who alerted facility staff that something was wrong.  Patient was found to have right sided weakness, right facial droop and dysarthria.  On exam, smile is symmetrical, voice is mildly dysarthric and right sided weakness is present.  Patient is taking warfarin for DVT, INR pending (delay in obtaining sample due to significant difficulty in obtaining venous access).   LKW: 0800 tNK given: No taking warfarin, INR unknown and deficits resolving, patient ultimately declined TNK IR Thrombectomy No, no LVO Modified Rankin Scale: 3-Moderate disability-requires help but walks WITHOUT assistance NIHSS: 7  ROS: A complete ROS was performed and is negative except as noted in the HPI.   Past Medical History:  Diagnosis Date   Arthritis    Cancer ([place])    Cataract    bilaterally   Hypertension    Myelodysplasia (myelodysplastic syndrome) ([place])    Myelodysplastic syndrome ([place])      Family History  Problem Relation Age of Onset   Hypertension Mother    Pulmonary embolism Mother    Heart disease Father     Social History:  reports that she has never smoked. She has never used smokeless tobacco. She reports that she does not drink alcohol and does not use drugs.   Prior to Admission medications   Medication Sig Start Date End Date Taking? Authorizing Provider  AMBULATORY NON FORMULARY MEDICATION Medication Name: rollator walker with seat per patient preference and insurance coverage 05/15/20   Emeterio Reeve, DO  amLODipine (NORVASC) 10 MG tablet Take 1 tablet (10 mg total) by mouth daily. 09/04/18   Emeterio Reeve,  DO  Calcium-Vitamin D-Vitamin K [id number] MG-UNT-MCG CHEW Chew 2 tablets by mouth daily. 12/04/20   Emeterio Reeve, DO  carvedilol (COREG) 25 MG tablet Take 1 tablet (25 mg total) by mouth 2 (two) times daily with a meal. 11/05/21   Luetta Nutting, DO  cholestyramine (QUESTRAN) 4 g packet Take by mouth. 02/12/21   [provider]  clindamycin (CLEOCIN) 150 MG capsule Take by mouth. 01/29/21   [provider]  cyanocobalamin (,VITAMIN B-12,) 1000 MCG/ML injection Inject 1 mL (1,000 mcg total) into the muscle every 30 (thirty) days. Please include syringes and needles as needed for monthly injections 02/15/20   Silverio Decamp, MD  denosumab (PROLIA) 60 MG/ML SOSY injection Inject 60 mg into the skin every 6 (six) months. Administer in upper arm, thigh, or abdomen 07/18/18   Emeterio Reeve, DO  esomeprazole (NEXIUM) 40 MG capsule Take 1 capsule (40 mg total) by mouth 2 (two) times daily before a meal. 05/02/20   Emeterio Reeve, DO  folic acid (FOLVITE) 1 MG tablet Take by mouth. 01/11/21   [provider]  HYDROcodone-acetaminophen (NORCO/VICODIN) 5-325 MG tablet Take 1-2 tablets by mouth every 4 (four) hours as needed for moderate pain (while awake, maximum 5 tablets per 24 hours). 03/24/21 04/23/21  Emeterio Reeve, DO  hydrOXYzine (ATARAX/VISTARIL) 25 MG tablet Take 0.5-1 tablets (12.5-25 mg total) by mouth every 8 (eight) hours as needed for itching. 12/26/19   Emeterio Reeve, DO  losartan (COZAAR) 100 MG tablet Take 1 tablet (  100 mg total) by mouth daily. 04/30/21   Emeterio Reeve, DO  naloxone [facility]) nasal spray 4 mg/0.1 mL use as directed intranasal  every 2 to 3 minutes until emergency team has arrived-- use in opioid emergency only 04/10/21   Emeterio Reeve, DO  nirmatrelvir/ritonavir EUA, renal dosing, (PAXLOVID) TBPK Patient GFR is 36. Take nirmatrelvir (150 mg) one tablet twice daily for 5 days and ritonavir (100 mg) one tablet twice daily for 5  days. 04/09/21   Emeterio Reeve, DO  Omega-3 Fatty Acids ([employer] FISH OIL PO) Take by mouth.    [provider]  Probiotic Product (MISC INTESTINAL FLORA REGULAT) CAPS Take 1 capsule by mouth daily as needed.    [provider]  sucralfate (CARAFATE) 1 g tablet Take 1 tablet by mouth 4 (four) times daily -  with meals and at bedtime. 07/04/20   [provider]  topiramate (TOPAMAX) 100 MG tablet Take 1.5 tabs (150 mg) in AM and 2 tabs (200 mg) in PM 12/26/19   Emeterio Reeve, DO  warfarin (COUMADIN) 5 MG tablet take 1 tablet daily, except Thursdays take 1/2 tablet 03/24/21   Emeterio Reeve, DO    Exam: Current vital signs: BP (!) 166/83 (BP Location: Right Arm)   Pulse 65   Temp 97.9 F (36.6 C) (Oral)   Resp 16   Ht '5\' 3"'$  (1.6 m)   Wt 55.6 kg   SpO2 99%   BMI 21.71 kg/m   Physical Exam  Constitutional: Appears well-developed and well-nourished.  Psych: Affect appropriate to situation Eyes: No scleral injection HENT: No OP obstruction. Head: Normocephalic.  Cardiovascular: Normal rate and regular rhythm.  Respiratory: Effort normal, symmetric excursions bilaterally, no audible wheezing. Skin: WDI  Neuro: Mental Status: Patient is awake, alert, oriented to person, place, month, year, and situation. Patient is able to give a clear and coherent history. Speech Slightly dysarthric, fluent, intact comprehension and repetition. No signs of aphasia or neglect. Visual Fields are full. Pupils are equal, round, and reactive to light. EOMI without ptosis or diplopia.  Facial sensation is symmetric to temperature Facial movement is symmetric.  Hearing is intact to voice. Tongue is midline without atrophy or fasciculations.  Tone is normal. Bulk is normal. 5/5 strength was present in LUE and LLE, 3/5 strength in RUE and RLE Sensation is symmetric to light touch and temperature in the arms and legs. Toes are downgoing bilaterally. Gait - Deferred  I  have reviewed labs in epic and the pertinent results are: pending  I have reviewed the images obtained: NCT head showed No ICH or acute abnormality  Assessment: Gloria Mckay is a 81 y.o. female PMHx of DVT on warfarin, left knee replacement, questionable history of seizures, migraines, CKD3b, partial gastrectomy, questionable history of TIA and myelodysplastic syndrome who presents with sudden onset right sided weakness, right facial droop and dysarthria.  TNK was not administered due to patient taking warfarin and INR not being available due to difficulties in venous access.  Will check EEG as patient has a possible seizure history, although she says she has never had a seizure.  INR came back at 1.6,  Discussion of risks and benefits of TNK was had with patient and family, and patient declined TNK as deficits had improved significantly and were now mild.   Impression:  81 year old patient with history of DVT on warfarin, atrial fibrillation, TIA, seizures and myelodysplastic syndrome presents with sudden onset right sided weakness, right facial droop and dysarthria.  Symptoms appear to be improving.  Patient declined TNK as deficits were now mild.  Will admit to hospitalist service for stroke workup with MRI, MRA head and neck, lipid panel, A1C and echocardiogram.  Plan: - MRI brain without contrast. - Recommend vascular imaging with MRA head and neck. - Recommend TTE. - Recommend labs: HbA1c, lipid panel. - Recommend Statin for goal LDL <70. - Goal A1c <7. - Permissive hypertension first 24 h < 220/110.  - Telemetry monitoring for arrhythmia. - Recommend bedside Swallow screen. - Recommend Stroke education. - Recommend PT/OT/SLP consult. - Routine EEG.   This patient is critically ill and at significant risk of neurological worsening, death and care requires constant monitoring of vital signs, hemodynamics,respiratory and cardiac monitoring, neurological assessment, discussion with  family, other specialists and medical decision making of high complexity. I spent 100 minutes of neurocritical care time  in the care of  this patient.   [place] , MSN, AGACNP-BC [facility] See Amion for schedule and pager information 02/11/2022 10:29 AM    If 7pm- 7am, please page neurology on call as listed in [place].

## 2022-02-11 NOTE — Assessment & Plan Note (Signed)
Continue carvedilol Hold amlodipine and hydralazine

## 2022-02-11 NOTE — Assessment & Plan Note (Signed)
Patient presents to the hospital for evaluation of right-sided weakness and slurred speech that has improved but not completely resolved. She was admitted as a code stroke and initial CT scan of the head was negative for bleed. Not a candidate for tPA since patient is on chronic anticoagulation with Coumadin and not a candidate for IR since symptoms appear to be resolving. MRI of the brain is negative for an acute infarct We will obtain 2D echocardiogram to assess LVEF and rule out cardiac thrombus We will request PT/OT/ST consult We will request neurology consult Start patient on high intensity statin

## 2022-02-11 NOTE — Assessment & Plan Note (Signed)
Most likely secondary to chronic kidney disease stage IV Continue sodium bicarbonate 650 mg 3 times a day

## 2022-02-11 NOTE — Code Documentation (Signed)
Stroke Response Nurse Documentation Code Documentation  Gloria Mckay is a 81 y.o. female arriving to [facility]  via [facility] on 6/15 with past medical hx of HTN, TIA, CKD 3, CA. On warfarin daily. Code stroke was activated by EMS.   Patient from [name] where she was LKW at 0800 when her breakfast tray was delivered and she was in her normal state. Her daughter called at 0830 and she was not talking normally; daughter called facility and staff entered room to find patient with right sided weakness and right facial droop.   Stroke team at the bedside on patient arrival. Patient cleared for CT by Dr. Peter Congo. Patient to CT with team. NIHSS 7, see documentation for details and code stroke times. Patient with right facial droop, right arm weakness, and bilateral leg weakness on exam.   The following imaging was completed:  CT Head. Patient is not a candidate for IV Thrombolytic due to coumadin. Patient is not a candidate for IR due to symptoms improving.   Delay and difficulty drawing lab work and getting IV access.   Care Plan: q2h mNIHSS/vitals, permissive HTN.   Bedside handoff with ED RN Camryn.    Candace Cruise K  Stroke Response RN

## 2022-02-12 ENCOUNTER — Inpatient Hospital Stay (HOSPITAL_BASED_OUTPATIENT_CLINIC_OR_DEPARTMENT_OTHER): Payer: Medicare Other

## 2022-02-12 DIAGNOSIS — R29818 Other symptoms and signs involving the nervous system: Secondary | ICD-10-CM | POA: Diagnosis not present

## 2022-02-12 DIAGNOSIS — G459 Transient cerebral ischemic attack, unspecified: Secondary | ICD-10-CM

## 2022-02-12 DIAGNOSIS — Z86718 Personal history of other venous thrombosis and embolism: Secondary | ICD-10-CM

## 2022-02-12 LAB — ECHOCARDIOGRAM COMPLETE
Area-P 1/2: 3.31 cm2
Calc EF: 67.8 %
Height: 63 in
S' Lateral: 2.3 cm
Single Plane A2C EF: 70.3 %
Single Plane A4C EF: 68.8 %
Weight: 1961.21 oz

## 2022-02-12 LAB — BASIC METABOLIC PANEL
Anion gap: 12 (ref 5–15)
BUN: 36 mg/dL — ABNORMAL HIGH (ref 8–23)
CO2: 16 mmol/L — ABNORMAL LOW (ref 22–32)
Calcium: 8.5 mg/dL — ABNORMAL LOW (ref 8.9–10.3)
Chloride: 117 mmol/L — ABNORMAL HIGH (ref 98–111)
Creatinine, Ser: 1.91 mg/dL — ABNORMAL HIGH (ref 0.44–1.00)
GFR, Estimated: 26 mL/min — ABNORMAL LOW (ref >60)
Glucose, Bld: 97 mg/dL (ref 70–99)
Potassium: 5.3 mmol/L — ABNORMAL HIGH (ref 3.5–5.1)
Sodium: 145 mmol/L (ref 135–145)

## 2022-02-12 LAB — LIPID PANEL
Cholesterol: 126 mg/dL (ref 0–200)
HDL: 48 mg/dL (ref >40)
LDL Cholesterol: 60 mg/dL (ref 0–99)
Total CHOL/HDL Ratio: 2.6 RATIO
Triglycerides: 88 mg/dL (ref <150)
VLDL: 18 mg/dL (ref 0–40)

## 2022-02-12 LAB — POTASSIUM
Potassium: 5.3 mmol/L — ABNORMAL HIGH (ref 3.5–5.1)
Potassium: 5 mmol/L (ref 3.5–5.1)

## 2022-02-12 LAB — CBC
HCT: 29.7 % — ABNORMAL LOW (ref 36.0–46.0)
Hemoglobin: 9.3 g/dL — ABNORMAL LOW (ref 12.0–15.0)
MCH: 31.3 pg (ref 26.0–34.0)
MCHC: 31.3 g/dL (ref 30.0–36.0)
MCV: 100 fL (ref 80.0–100.0)
Platelets: 185 10*3/uL (ref 150–400)
RBC: 2.97 MIL/uL — ABNORMAL LOW (ref 3.87–5.11)
RDW: 16.9 % — ABNORMAL HIGH (ref 11.5–15.5)
WBC: 5.1 10*3/uL (ref 4.0–10.5)
nRBC: 0 % (ref 0.0–0.2)

## 2022-02-12 LAB — HEMOGLOBIN A1C
Hgb A1c MFr Bld: 5 % (ref 4.8–5.6)
Mean Plasma Glucose: 96.8 mg/dL

## 2022-02-12 LAB — PROTIME-INR
INR: 1.4 — ABNORMAL HIGH (ref 0.8–1.2)
Prothrombin Time: 17.2 seconds — ABNORMAL HIGH (ref 11.4–15.2)

## 2022-02-12 MED ORDER — HYDRALAZINE HCL 25 MG PO TABS
25.0000 mg | ORAL_TABLET | Freq: Three times a day (TID) | ORAL | Status: DC
  Administered 2022-02-12 – 2022-02-13 (×3): 25 mg via ORAL
  Filled 2022-02-12 (×3): qty 1

## 2022-02-12 MED ORDER — FOSFOMYCIN TROMETHAMINE 3 G PO PACK
3.0000 g | PACK | Freq: Once | ORAL | Status: AC
Start: 2022-02-12 — End: 2022-02-12
  Administered 2022-02-12: 3 g via ORAL
  Filled 2022-02-12: qty 3

## 2022-02-12 MED ORDER — PREGABALIN 25 MG PO CAPS
25.0000 mg | ORAL_CAPSULE | Freq: Two times a day (BID) | ORAL | Status: DC
  Administered 2022-02-12 – 2022-02-13 (×3): 25 mg via ORAL
  Filled 2022-02-12 (×3): qty 1

## 2022-02-12 MED ORDER — NITROFURANTOIN MONOHYD MACRO 100 MG PO CAPS: 100.0000 mg | ORAL_CAPSULE | Freq: Two times a day (BID) | ORAL | Status: DC

## 2022-02-12 MED ORDER — TIZANIDINE HCL 4 MG PO TABS
2.0000 mg | ORAL_TABLET | Freq: Every day | ORAL | Status: DC
  Administered 2022-02-12: 2 mg via ORAL
  Filled 2022-02-12: qty 1

## 2022-02-12 MED ORDER — TOPIRAMATE 100 MG PO TABS
100.0000 mg | ORAL_TABLET | Freq: Two times a day (BID) | ORAL | Status: DC
  Administered 2022-02-12 – 2022-02-13 (×3): 100 mg via ORAL
  Filled 2022-02-12 (×3): qty 1

## 2022-02-12 MED ORDER — SODIUM ZIRCONIUM CYCLOSILICATE 10 G PO PACK
10.0000 g | PACK | Freq: Once | ORAL | Status: AC
Start: 2022-02-12 — End: 2022-02-12
  Administered 2022-02-12: 10 g via ORAL

## 2022-02-12 MED ORDER — WARFARIN SODIUM 6 MG PO TABS
6.0000 mg | ORAL_TABLET | Freq: Once | ORAL | Status: AC
  Administered 2022-02-12: 6 mg via ORAL
  Filled 2022-02-12: qty 1

## 2022-02-12 NOTE — Care Management CC44 (Signed)
Condition Code 44 Documentation Completed  Patient Details  Name: Gloria Mckay MRN: [id number] Date of Birth: 24-Feb-1941   Condition Code 44 given:  Yes Patient signature on Condition Code 44 notice:  Yes Documentation of 2 MD's agreement:  Yes Code 44 added to claim:  Yes    [name], LCSW 02/12/2022, 2:52 PM

## 2022-02-12 NOTE — Progress Notes (Signed)
TRH night cross cover note:  I was notified by RN of updated serum potassium level of 5.3.   Per my chart review, hyperkalemia noted at time of admission, with initial value of 5.9.  Ensuing management for her hyperkalemia has included initiation of Lokelma 10 g p.o. daily, with receipt of interval 1 dose, along with IV insulin/amp of D50, and scheduled sodium bicarbonate p.o. 3 times daily.  With these measures, q4 hours interval monitoring of serum potassium levels has demonstrated sequential decline in potassium level, with ensuing potassium trend noted to be 5.6 followed by 5.4 leading up to this most recent value of 5.3.  Per my review of existing orders, every 4 hours serum potassium level order persists. Will continue to monitor ensuing trend in serum potassium level via this existing set of orders and pharmacologic interventions, as above. No new orders at this time.     Babs Bertin, DO Hospitalist

## 2022-02-12 NOTE — Plan of Care (Signed)
Pt is alert oriented x 4. RA, pt was noted crying, pt stated she had been trying to get up with daughter but no answer. Pts daughter had called RN recently pt informed. Pt assessed and then assisted pt in calling her daughter on her phone. Pt cried but then calmed down after speaking with daughter. Pt has purewick for urine output.     Problem: Education: Goal: Knowledge of General Education information will improve Description: Including pain rating scale, medication(s)/side effects and non-pharmacologic comfort measures Outcome: Progressing   Problem: Health Behavior/Discharge Planning: Goal: Ability to manage health-related needs will improve Outcome: Progressing   Problem: Clinical Measurements: Goal: Ability to maintain clinical measurements within normal limits will improve Outcome: Progressing Goal: Will remain free from infection Outcome: Progressing Goal: Diagnostic test results will improve Outcome: Progressing Goal: Respiratory complications will improve Outcome: Progressing Goal: Cardiovascular complication will be avoided Outcome: Progressing   Problem: Activity: Goal: Risk for activity intolerance will decrease Outcome: Progressing   Problem: Nutrition: Goal: Adequate nutrition will be maintained Outcome: Progressing   Problem: Coping: Goal: Level of anxiety will decrease Outcome: Progressing   Problem: Elimination: Goal: Will not experience complications related to bowel motility Outcome: Progressing Goal: Will not experience complications related to urinary retention Outcome: Progressing   Problem: Pain Managment: Goal: General experience of comfort will improve Outcome: Progressing   Problem: Safety: Goal: Ability to remain free from injury will improve Outcome: Progressing   Problem: Skin Integrity: Goal: Risk for impaired skin integrity will decrease Outcome: Progressing

## 2022-02-12 NOTE — Progress Notes (Signed)
PT Cancellation Note  Patient Details Name: Gloria Mckay MRN: [id number] DOB: 07/08/1941   Cancelled Treatment:    Reason Eval/Treat Not Completed: Other (comment) Have attempted three times this morning to see pt.  She was eating, then with SLP, now having her hair washed.  Will attempt later as able and pt available.   Reginia Naas 02/12/2022, 11:04 AM Magda Kiel, PT Acute Rehabilitation Services KGURK:[phone] [phone] 02/12/2022

## 2022-02-12 NOTE — Care Management Obs Status (Signed)
[place] NOTIFICATION   Patient Details  Name: Devine Dant MRN: [id number] Date of Birth: 06-11-1941   Medicare Observation Status Notification Given:  Yes    [name], LCSW 02/12/2022, 2:52 PM

## 2022-02-12 NOTE — Evaluation (Signed)
Occupational Therapy Evaluation Patient Details Name: Gloria Mckay MRN: [id number] DOB: 11-Jan-1941 Today's Date: 02/12/2022   History of Present Illness Pt is a 81 y/o female presenting on 6/15 with slurred speech and R sided weakness. MRI negative. Found with UTI. PMH includes: HTN, myelodysplastic syndrome, CKD4, anemia, DVT, cancer.   Clinical Impression   PTA patient independent with most basic ADLs, needing assistance from staff at SNF as needed; using RW for mobility.  Admitted for above and presents with R UE weakness, impaired balance and L LE pain (baseline).  Currently completes ADLs with up to min guard assist, mobility and transfers using RW with min guard assist.  Believe she will benefit from continued OT services acutely and after dc at SNF level to optimize independence, strength and return to PLOF.       Recommendations for follow up therapy are one component of a multi-disciplinary discharge planning process, led by the attending physician.  Recommendations may be updated based on patient status, additional functional criteria and insurance authorization.   Follow Up Recommendations  Skilled nursing-short term rehab (<3 hours/day)    Assistance Recommended at Discharge Frequent or constant Supervision/Assistance  Patient can return home with the following A little help with walking and/or transfers;A little help with bathing/dressing/bathroom;Assistance with cooking/housework;Direct supervision/assist for medications management;Direct supervision/assist for financial management;Assist for transportation;Help with stairs or ramp for entrance    Functional Status Assessment  Patient has had a recent decline in their functional status and demonstrates the ability to make significant improvements in function in a reasonable and predictable amount of time.  Equipment Recommendations  None recommended by OT    Recommendations for Other Services       Precautions / Restrictions  Precautions Precautions: Fall Precaution Comments: L LE weakness PTA Restrictions Weight Bearing Restrictions: No      Mobility Bed Mobility Overal bed mobility: Needs Assistance Bed Mobility: Sit to Supine       Sit to supine: Min assist   General bed mobility comments: for L LE    Transfers                          Balance Overall balance assessment: Needs assistance   Sitting balance-Leahy Scale: Good     Standing balance support: No upper extremity supported, During functional activity, Bilateral upper extremity supported Standing balance-Leahy Scale: Poor Standing balance comment: relies on RW, min guard during ADLs with 0-1 hand support                           ADL either performed or assessed with clinical judgement   ADL Overall ADL's : Needs assistance/impaired     Grooming: Min guard;Standing           Upper Body Dressing : Set up;Sitting   Lower Body Dressing: Min guard;Sit to/from stand   Toilet Transfer: Min guard;Ambulation;Rolling walker (2 wheels)   Toileting- Clothing Manipulation and Hygiene: Min guard;Sit to/from stand       Functional mobility during ADLs: Min guard;Rolling walker (2 wheels)       Vision   Vision Assessment?: No apparent visual deficits     Perception     Praxis      Pertinent Vitals/Pain Pain Assessment Pain Assessment: No/denies pain     Hand Dominance Right   Extremity/Trunk Assessment Upper Extremity Assessment Upper Extremity Assessment: RUE deficits/detail RUE Deficits / Details: grossly 3-/5 MMT compared to  3+ on L side.  Sensation intact.  Mild decreased [place]. RUE Sensation: WNL RUE Coordination: decreased fine motor   Lower Extremity Assessment Lower Extremity Assessment: Defer to PT evaluation RLE Deficits / Details: AROM WFL strength grossly 4/5 LLE Deficits / Details: AROM WFL, note previous scars from knee surgeries in the past and leg with dark color and edematous  with vascular issues from DVT.  strength hip flexion 3-/6, knee extension 0-1/5, ankle DF 3+/5   Cervical / Trunk Assessment Cervical / Trunk Assessment: Kyphotic   Communication Communication Communication: Expressive difficulties (ataxic vocal quality)   Cognition Arousal/Alertness: Awake/alert Behavior During Therapy: WFL for tasks assessed/performed Overall Cognitive Status: Within Functional Limits for tasks assessed                                       General Comments  daughter present    Exercises     Shoulder Instructions      Home Living Family/patient expects to be discharged to:: Skilled nursing facility ([facility])   Available Help at Discharge: [place];Available 24 hours/day Type of Home: [place]                                  Prior Functioning/Environment Prior Level of Function : Needs assist             Mobility Comments: walks with a walker ADLs Comments: sometimes needs help for ADLs, but typically does ADLs independently.  has meals provided in her room        OT Problem List: Decreased strength;Decreased activity tolerance;Impaired balance (sitting and/or standing);Decreased coordination;Pain      OT Treatment/Interventions: Self-care/ADL training;Therapeutic exercise;DME and/or AE instruction;Therapeutic activities;Balance training;Patient/family education    OT Goals(Current goals can be found in the care plan section) Acute Rehab OT Goals Patient Stated Goal: get back to SNF OT Goal Formulation: With patient Time For Goal Achievement: 02/26/22 Potential to Achieve Goals: Good  OT Frequency: Min 2X/week    Co-evaluation              AM-PAC OT "6 Clicks" Daily Activity     Outcome Measure Help from another person eating meals?: A Little Help from another person taking care of personal grooming?: A Little Help from another person toileting, which includes using  toliet, bedpan, or urinal?: A Little Help from another person bathing (including washing, rinsing, drying)?: A Little Help from another person to put on and taking off regular upper body clothing?: A Little Help from another person to put on and taking off regular lower body clothing?: A Little 6 Click Score: 18   End of Session Equipment Utilized During Treatment: Gait belt;Rolling walker (2 wheels) Nurse Communication: Mobility status  Activity Tolerance: Patient tolerated treatment well Patient left: in bed;with call bell/phone within reach;with bed alarm set;with family/visitor present  OT Visit Diagnosis: Other abnormalities of gait and mobility (R26.89);Muscle weakness (generalized) (M62.81)                Time: 1610-9604 OT Time Calculation (min): 31 min Charges:  OT General Charges $OT Visit: 1 Visit OT Evaluation $OT Eval Moderate Complexity: 1 Mod OT Treatments $Self Care/Home Management : 8-22 mins  Jolaine Artist, OT Acute Rehabilitation Services Office [phone]   Delight Stare 02/12/2022, 2:01 PM

## 2022-02-12 NOTE — Progress Notes (Signed)
2D echo attempted, Physical therapy with patient. Will try echo later.

## 2022-02-12 NOTE — Progress Notes (Signed)
ANTICOAGULATION CONSULT NOTE - Follow Up Consult  Pharmacy Consult for Warfarin Indication: atrial fibrillation and hx DVT  Allergies  Allergen Reactions   Avocado Shortness Of Breath   Benadryl [Diphenhydramine] Palpitations    Affected heart rate ER told her not to take again   Cephalosporins Anaphylaxis   Latex Anaphylaxis, Shortness Of Breath and Rash    Airway swelling   Other Rash    Fluoroquinolones - pt prefers not to take after breaking out in a rash after taking ciprofloxacin.   Valium [Diazepam] Shortness Of Breath and Other (See Comments)    Hyperventilation    Zinacef [Cefuroxime] Anaphylaxis   Methylpyrrolidone Hives   Norvasc [Amlodipine] Cough   Penicillins Hives and Swelling    Most mycin drugs   Quinolones Other (See Comments)    Joint pain   Roxicodone [Oxycodone] Hives and Nausea Only   Stadol [Butorphanol] Hives   Sulfa Antibiotics Hives and Swelling   Tomato Other (See Comments)    Mouth sores   Voltaren [Diclofenac] Hives    Pt able to use diclofenac gel 1%   Zofran [Ondansetron] Hives and Other (See Comments)    Sedation   Altace [Ramipril] Cough   Iodine I 131 Tositumomab Other (See Comments)    Listed on MAR as allergy Unknown reaction   Lodine [Etodolac] Other (See Comments)    Listed on MAR as allergy Unknown reaction   Nexium [Esomeprazole Magnesium] Diarrhea and Nausea And Vomiting   Protonix [Pantoprazole] Diarrhea   Zestril [Lisinopril] Cough   Ace Inhibitors Other (See Comments) and Cough    Lisinopril and ramipril    Cipro [Ciprofloxacin Hcl] Rash   Codeine Nausea Only    Patient Measurements: Height: '5\' 3"'$  (160 cm) Weight: 55.6 kg (122 lb 9.2 oz) IBW/kg (Calculated) : 52.4  Vital Signs: Temp: 99.7 F (37.6 C) (06/16 0853) Temp Source: Oral (06/16 0853) BP: 156/72 (06/16 0853) Pulse Rate: 71 (06/16 0853)  Labs: Recent Labs    02/11/22 1000 02/12/22 0547 02/12/22 0816  HGB 8.7* 9.3*  --   HCT 29.3* 29.7*  --   PLT  192 185  --   APTT 36  --   --   LABPROT 18.6* 17.2*  --   INR 1.6* 1.4*  --   CREATININE 1.95*  --  1.91*    Estimated Creatinine Clearance: 19.8 mL/min (A) (by C-G formula based on SCr of 1.91 mg/dL (H)).  Assessment: Gloria Mckay a 81 y.o. female presented with stroke like symptoms on warfarin PTA for h/o DVT and atrial fibrillation.[facility] for [facility]in dosing.   INR subtherapeutic (1.6) on admit 6/15 and remains subtherapeutic (1.4) after Warfarin 5 mg x 1 given on 6/15.  Hgb low stable. FOB negative  PTA warfarin: 4.5 mg daily. Per med history, recently decreased from 5.5 mg daily.  Goal of Therapy:  INR 2-3 Monitor platelets by anticoagulation protocol: Yes   Plan:  Warfarin 6 mg x 1 today Daily PT/INR.  Daily CBC for now. Monitor for signs/symptoms of bleeding.  Arty Baumgartner, RPh 02/12/2022,3:25 PM

## 2022-02-12 NOTE — Evaluation (Signed)
Speech Language Pathology Evaluation Patient Details Name: Gloria Mckay MRN: [id number] DOB: [birth year] Today's Date: 02/12/2022 Time: 1015-1040 SLP Time Calculation (min) (ACUTE ONLY): 25 min  Problem List:  Patient Active Problem List   Diagnosis Date Noted   TIA (transient ischemic attack) 02/11/2022   Hypertension    CKD (chronic kidney disease) stage 4, GFR 15-29 ml/min (HCC)    Hyperkalemia    Normal anion gap metabolic acidosis    Enterococcus UTI 02/23/2021   Thrombocytopenia ([place]) 02/20/2021   Chronic antibiotic suppression 02/20/2021   Anemia of chronic kidney failure, stage 3 (moderate) ([place]) 02/20/2021   Encounter for Medicare annual wellness exam 07/14/2020   Dysuria 07/14/2020   B12 deficiency 02/08/2020   Acute abdominal pain 02/08/2020   Myelodysplastic syndrome with 5 q minus ([place]) 08/28/2018   Accidental fall 08/11/2018   Headache 06/16/2018   Atrial tachycardia ([place]) 05/08/2018   Pain in right hip 03/21/2018   Vertebral compression fracture ([place]) 03/21/2018   Anemia of chronic disease 11/08/2017   Bronchiectasis with acute lower respiratory infection ([place]) 10/28/2017   Vocal tremor 10/13/2017   Chronic infection of prosthetic knee, subsequent encounter 09/06/2017   Biclonal gammopathy 08/12/2017   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[place]) 08/12/2017   Pseudomonas infection 04/25/2017   Long term (current) use of anticoagulants [Z79.01] 02/24/2017   Primary hypercoagulable state ([place]) [D68.59] 02/24/2017   Atrial fibrillation ([place]) [I48.91] 02/24/2017   DDD (degenerative disc disease), cervical 02/11/2017   Elevated MCV 01/11/2017   History of partial gastrectomy 01/11/2017   Benign hypertension with CKD (chronic kidney disease) stage III ([place]) 10/28/2016   History of arthroplasty of left knee 10/25/2016   Plantar fasciitis, left 10/22/2016   Fibroma of tongue 06/14/2016   Postmenopausal osteoporosis 02/06/2016   History of pulmonary embolism 01/30/2016   CKD (chronic kidney  disease) stage 3, GFR 30-59 ml/min (HCC) 08/19/2015   Dry eye syndrome of both lacrimal glands 08/19/2015   Status post cataract extraction and insertion of intraocular lens of left eye 08/19/2015   Chronic hypernatremia 08/19/2015   Cortical age-related cataract of right eye 08/19/2015   Nuclear sclerotic cataract of right eye 08/19/2015   CKD stage G3b/A1, GFR 30-44 and albumin creatinine ratio <30 mg/g ([place]) 08/19/2015   Iron deficiency anemia due to chronic blood loss 02/21/2015   AMD (age-related macular degeneration), bilateral 02/05/2015   Congenital hypertrophy of retinal pigment epithelium 02/05/2015   Endothelial corneal dystrophy 02/05/2015   Cornea guttata 02/05/2015   Lumbar spinal stenosis 05/09/2014   S/P colonoscopy 01/23/2014   S/P endoscopy 01/23/2014   Gastric bezoar 08/06/2013   History of MRSA infection 12/30/2012   Long term current use of anticoagulant therapy 08/23/2011   Mild intermittent asthma without complication 02/23/9484   Chest pain 01/15/2010   Esophageal reflux 11/05/2009   Gastroesophageal reflux disease without esophagitis 11/05/2009   DDD (degenerative disc disease), lumbar 09/19/2009   Infection and inflammatory reaction due to internal joint prosthesis ([place]) 08/08/2009   Vitamin D deficiency 05/12/2009   Chronic nausea 02/12/2009   History of DVT of lower extremity 02/12/2009   Recurrent major depressive disorder, in partial remission ([place]) 02/12/2009   Past Medical History:  Past Medical History:  Diagnosis Date   Arthritis    Cancer ([place])    Cataract    bilaterally   Hypertension    Myelodysplasia (myelodysplastic syndrome) ([place])    Myelodysplastic syndrome ([place])    Past Surgical History:  Past Surgical History:  Procedure Laterality Date   JOINT REPLACEMENT  STOMACH SURGERY     HPI:  81yo female admitted 02/11/22 with slurred speech and right side weakness. PMH: HTN, myelodysplastic syndrome, CKD4, anemia, DVT, cancer. MRI =  no acute infarction, hemorrhage or mass. Mild-mod chronic microvascular ischemic changes.   Assessment / Plan / Recommendation Clinical Impression  Pt was seen at bedside to assess speech, language, and cognition. 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 CN exam is unremarkable. Speech is fully intelligible, however, she exhibits a tremor in her voice she reports she has had for "years". Receptive and Expressive Language are intact. The Mini-Mental State Examination (MMSE) was administered to assess cognition. Pt scored 28/30, which is WFL for her age and education. 2 points were lost on spelling WORLD backwards. At this time, no further ST intervention is recommended. Please reconsult if needs arise.    SLP Assessment  SLP Recommendation/Assessment: Patient does not need any further Speech Language Pathology Services  SLP Visit Diagnosis: Cognitive communication deficit (R41.841)    Recommendations for follow up therapy are one component of a multi-disciplinary discharge planning process, led by the attending physician.  Recommendations may be updated based on patient status, additional functional criteria and insurance authorization.    Follow Up Recommendations  No SLP follow up    Assistance Recommended at Discharge  Other (comment) (SNF staff)  Functional Status Assessment Patient has not had a recent decline in their functional status     SLP Evaluation Cognition  Overall Cognitive Status: Within Functional Limits for tasks assessed Arousal/Alertness: Awake/alert Orientation Level: Oriented X4       Comprehension  Auditory Comprehension Overall Auditory Comprehension: Appears within functional limits for tasks assessed Reading Comprehension Reading Status: Within funtional limits    Expression Expression Primary Mode of Expression: Verbal Verbal Expression Overall Verbal Expression: Appears within functional limits for tasks assessed Written Expression Dominant Hand:  Right Written Expression: Within Functional Limits   Oral / Motor  Oral Motor/Sensory Function Overall Oral Motor/Sensory Function: Within functional limits Motor Speech Overall Motor Speech: Appears within functional limits for tasks assessed           Gloria Mckay B. Quentin Ore, [facility], [place] Speech Language Pathologist Office: [phone]  Shonna Chock 02/12/2022, 10:49 AM

## 2022-02-12 NOTE — Progress Notes (Signed)
  Echocardiogram 2D Echocardiogram has been performed.  Gloria Mckay 02/12/2022, 2:13 PM

## 2022-02-12 NOTE — Evaluation (Signed)
Physical Therapy Evaluation Patient Details Name: Gloria Mckay MRN: [id number] DOB: 1940-10-09 Today's Date: 02/12/2022  History of Present Illness  Gloria Mckay is a 81 y.o. female with medical history significant for hypertension, myelodysplastic syndrome, stage IV chronic kidney disease, anemia of chronic disease, history of DVT admitted with  slurred speech and right-sided weakness.  CT negative for bleed and not candidate for TNK due to Coumadin.  Symptoms improved after admission.  Also found to have UTI.  Clinical Impression  Patient presents with history of L LE weakness from prior surgeries and DVT.  She walks with a walker and has meals in her room at the nursing facility.  She reports plans to return to facility, though has to balance resting her leg with elevation with participating in therapies.  Feel she will benefit from continued skilled PT in the acute setting and from follow up STSNF level rehab at d/c.        Recommendations for follow up therapy are one component of a multi-disciplinary discharge planning process, led by the attending physician.  Recommendations may be updated based on patient status, additional functional criteria and insurance authorization.  Follow Up Recommendations Skilled nursing-short term rehab (<3 hours/day)    Assistance Recommended at Discharge Intermittent Supervision/Assistance  Patient can return home with the following  A lot of help with walking and/or transfers;A little help with bathing/dressing/bathroom;Assistance with cooking/housework;Help with stairs or ramp for entrance;Assist for transportation    Equipment Recommendations None recommended by PT  Recommendations for Other Services       Functional Status Assessment Patient has had a recent decline in their functional status and demonstrates the ability to make significant improvements in function in a reasonable and predictable amount of time.     Precautions / Restrictions  Precautions Precautions: Fall Precaution Comments: L LE weakness PTA      Mobility  Bed Mobility Overal bed mobility: Needs Assistance Bed Mobility: Sit to Supine       Sit to supine: Min assist   General bed mobility comments: for L LE    Transfers Overall transfer level: Needs assistance Equipment used: Rolling walker (2 wheels) Transfers: Sit to/from Stand Sit to Stand: Min assist           General transfer comment: for safety/balance    Ambulation/Gait Ambulation/Gait assistance: Min guard Gait Distance (Feet): 70 Feet Assistive device: Rolling walker (2 wheels) Gait Pattern/deviations: Step-through pattern, Step-to pattern, Antalgic, Decreased stride length, Shuffle       General Gait Details: limps on L LE  Stairs            Wheelchair Mobility    Modified Rankin (Stroke Patients Only) Modified Rankin (Stroke Patients Only) Pre-Morbid Rankin Score: Moderately severe disability Modified Rankin: Moderately severe disability     Balance Overall balance assessment: Needs assistance   Sitting balance-Leahy Scale: Good     Standing balance support: No upper extremity supported, During functional activity Standing balance-Leahy Scale: Poor Standing balance comment: in standing to pull down briefs to place pad assisted for balance                             Pertinent Vitals/Pain Pain Assessment Pain Assessment: No/denies pain    Home Living Family/patient expects to be discharged to:: [facility]illed nursing facility ([facility])   Available Help at Discharge: [facility];Available 24 hours/day Type of Home: [place]  Prior Function Prior Level of Function : Needs assist             Mobility Comments: walks with a walker ADLs Comments: sometimes gets help in the shower, but mostly does it on her own; has meals provided in her room     Hand Dominance   Dominant Hand:  Right    Extremity/Trunk Assessment   Upper Extremity Assessment Upper Extremity Assessment: Defer to OT evaluation    Lower Extremity Assessment Lower Extremity Assessment: RLE deficits/detail;LLE deficits/detail RLE Deficits / Details: AROM WFL strength grossly 4/5 LLE Deficits / Details: AROM WFL, note previous scars from knee surgeries in the past and leg with dark color and edematous with vascular issues from DVT.  strength hip flexion 3-/6, knee extension 0-1/5, ankle DF 3+/5    Cervical / Trunk Assessment Cervical / Trunk Assessment: Kyphotic  Communication   Communication: Expressive difficulties (ataxic vocal quality)  Cognition Arousal/Alertness: Awake/alert Behavior During Therapy: WFL for tasks assessed/performed Overall Cognitive Status: Within Functional Limits for tasks assessed                                          General Comments General comments (skin integrity, edema, etc.): daughter in the room and supportive, on phone with physician part of the time    Exercises     Assessment/Plan    PT Assessment Patient needs continued PT services  PT Problem List Decreased strength;Decreased balance;Decreased activity tolerance;Decreased mobility       PT Treatment Interventions DME instruction;Gait training;Functional mobility training;Therapeutic activities;Therapeutic exercise;Patient/family education;Balance training    PT Goals (Current goals can be found in the Care Plan section)  Acute Rehab PT Goals Patient Stated Goal: to [facility] to [name] PT Goal Formulation: With patient/family Time For Goal Achievement: 02/26/22 Potential to Achieve Goals: Fair    Frequency Min 2X/week     Co-evaluation               AM-PAC PT "6 Clicks" Mobility  Outcome Measure Help needed turning from your back to your side while in a flat bed without using bedrails?: A Little Help needed moving from lying on your back to sitting  on the side of a flat bed without using bedrails?: A Little Help needed moving to and from a bed to a chair (including a wheelchair)?: A Little Help needed standing up from a chair using your arms (e.g., wheelchair or bedside chair)?: A Little Help needed to walk in hospital room?: A Little Help needed climbing 3-5 steps with a railing? : Total 6 Click Score: 16    End of Session Equipment Utilized During Treatment: Gait belt Activity Tolerance: Patient tolerated treatment well Patient left: in bed;with call bell/phone within reach;with family/visitor present   PT Visit Diagnosis: Other abnormalities of gait and mobility (R26.89)    Time: 6283-6629 PT Time Calculation (min) (ACUTE ONLY): 19 min   Charges:   PT Evaluation $PT Eval Moderate Complexity: 1 Mod          Cyndi Mckensie Scotti, PT Acute Rehabilitation Services Pager:[phone] Office:[phone] 02/12/2022   Gloria Mckay 02/12/2022, 1:46 PM

## 2022-02-12 NOTE — TOC Initial Note (Signed)
Transition of Care [facility]) - Initial/Assessment Note    Patient Details  Name: Gloria Mckay MRN: [id number] Date of Birth: [birth year]  [facility] CM/SW Contact:    [name], LCSW Phone Number: 02/12/2022, 2:28 PM  Clinical Narrative:           CSW confirmed that patient is from [facility] and plans to return. Patient is in long term care. Gloria Mckay is able to admit patient back tomorrow, pending medical stability. CSW to follow.        Expected Discharge Plan: [place] Barriers to Discharge: Continued Medical Work up   Patient Goals and CMS Choice Patient states their goals for this hospitalization and ongoing recovery are:: to get back to [facility] CMS Medicare.gov Compare Post Acute Care list provided to:: Patient Choice offered to / list presented to : Patient  Expected Discharge Plan and Services Expected Discharge Plan: [place] Choice: [place] Living arrangements for the past 2 months: [place]                                      Prior Living Arrangements/Services Living arrangements for the past 2 months: [place] Lives with:: Facility Resident Patient language and need for interpreter reviewed:: No Do you feel safe going back to the place where you live?: Yes      Need for Family Participation in Patient Care: No (Comment) Care giver support system in place?: Yes (comment)   Criminal Activity/Legal Involvement Pertinent to Current Situation/Hospitalization: No - Comment as needed  Activities of Daily Living      Permission Sought/Granted Permission sought to share information with : Facility Sport and exercise psychologist, Family Supports Permission granted to share information with : Yes, Verbal Permission Granted  Share Information with NAME: Denman Gloria  Permission granted to share info w AGENCY: [facility]  Permission granted  to share info w Relationship: Daughter     Emotional Assessment Appearance:: Appears stated age Attitude/Demeanor/Rapport: Engaged Affect (typically observed): Appropriate Orientation: : Oriented to Self, Oriented to Place, Oriented to  Time, Oriented to Situation Alcohol / Substance Use: Not Applicable Psych Involvement: No (comment)  Admission diagnosis:  TIA (transient ischemic attack) [G45.9] Anemia, unspecified type [D64.9] Patient Active Problem List   Diagnosis Date Noted   TIA (transient ischemic attack) 02/11/2022   Hypertension    CKD (chronic kidney disease) stage 4, GFR 15-29 ml/min (HCC)    Hyperkalemia    Normal anion gap metabolic acidosis    Enterococcus UTI 02/23/2021   Thrombocytopenia ([place]) 02/20/2021   Chronic antibiotic suppression 02/20/2021   Anemia of chronic kidney failure, stage 3 (moderate) ([place]) 02/20/2021   Encounter for Medicare annual wellness exam 07/14/2020   Dysuria 07/14/2020   B12 deficiency 02/08/2020   Acute abdominal pain 02/08/2020   Myelodysplastic syndrome with 5 q minus ([place]) 08/28/2018   Accidental fall 08/11/2018   Headache 06/16/2018   Atrial tachycardia ([place]) 05/08/2018   Pain in right hip 03/21/2018   Vertebral compression fracture ([place]) 03/21/2018   Anemia of chronic disease 11/08/2017   Bronchiectasis with acute lower respiratory infection ([place]) 10/28/2017   Vocal tremor 10/13/2017   Chronic infection of prosthetic knee, subsequent encounter 09/06/2017   Biclonal gammopathy 08/12/2017   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[place]) 08/12/2017   Pseudomonas infection 04/25/2017   Long term (current) use of  anticoagulants [Z79.01] 02/24/2017   Primary hypercoagulable state ([place]) [D68.59] 02/24/2017   Atrial fibrillation ([place]) [I48.91] 02/24/2017   DDD (degenerative disc disease), cervical 02/11/2017   Elevated MCV 01/11/2017   History of partial gastrectomy 01/11/2017   Benign hypertension with CKD (chronic kidney disease) stage III (HCC)  10/28/2016   History of arthroplasty of left knee 10/25/2016   Plantar fasciitis, left 10/22/2016   Fibroma of tongue 06/14/2016   Postmenopausal osteoporosis 02/06/2016   History of pulmonary embolism 01/30/2016   CKD (chronic kidney disease) stage 3, GFR 30-59 ml/min (HCC) 08/19/2015   Dry eye syndrome of both lacrimal glands 08/19/2015   Status post cataract extraction and insertion of intraocular lens of left eye 08/19/2015   Chronic hypernatremia 08/19/2015   Cortical age-related cataract of right eye 08/19/2015   Nuclear sclerotic cataract of right eye 08/19/2015   CKD stage G3b/A1, GFR 30-44 and albumin creatinine ratio <30 mg/g (HCC) 08/19/2015   Iron deficiency anemia due to chronic blood loss 02/21/2015   AMD (age-related macular degeneration), bilateral 02/05/2015   Congenital hypertrophy of retinal pigment epithelium 02/05/2015   Endothelial corneal dystrophy 02/05/2015   Cornea guttata 02/05/2015   Lumbar spinal stenosis 05/09/2014   S/P colonoscopy 01/23/2014   S/P endoscopy 01/23/2014   Gastric bezoar 08/06/2013   History of MRSA infection 12/30/2012   Long term current use of anticoagulant therapy 08/23/2011   Mild intermittent asthma without complication 79/10/4095   Chest pain 01/15/2010   Esophageal reflux 11/05/2009   Gastroesophageal reflux disease without esophagitis 11/05/2009   DDD (degenerative disc disease), lumbar 09/19/2009   Infection and inflammatory reaction due to internal joint prosthesis ([place]) 08/08/2009   Vitamin D deficiency 05/12/2009   Chronic nausea 02/12/2009   History of DVT of lower extremity 02/12/2009   Recurrent major depressive disorder, in partial remission ([place]) 02/12/2009   PCP:  Merryl Hacker No Pharmacy:   [place], [place] - [address] [place] [address] Phone: [phone] Fax: [phone]     Social Determinants of Health (SDOH) Interventions     Readmission Risk Interventions     No data to display

## 2022-02-12 NOTE — Progress Notes (Signed)
STROKE TEAM PROGRESS NOTE   SUBJECTIVE (INTERVAL HISTORY) Her daughter and son are at the bedside.  Overall her condition is completely resolved. Pt used walker to go in and out of bathroom without assistance.   Discussed with family and patient regarding Coumadin use with difficulty INR regulation.  Although Coumadin preferred in the CKD patient, however patient IR frequently outside target range, DOAC with renal dosing may be a consideration.  Daughter and son seems interested, however, patient hesitant to change at this time.  She preferred to discuss with her hematologist and nephrologist before making decision.    OBJECTIVE Temp:  [97.9 F (36.6 C)-99.7 F (37.6 C)] 98.6 F (37 C) (06/16 1630) Pulse Rate:  [67-80] 71 (06/16 1630) Cardiac Rhythm: Normal sinus rhythm (06/16 0818) Resp:  [16-21] 18 (06/16 1630) BP: (127-176)/(63-117) 127/70 (06/16 1630) SpO2:  [95 %-98 %] 98 % (06/16 1630)  Recent Labs  Lab 02/11/22 0921 02/11/22 1523 02/11/22 1744 02/11/22 1810  GLUCAP 77 87 79 102*   Recent Labs  Lab 02/11/22 1000 02/11/22 1426 02/11/22 2006 02/12/22 0137 02/12/22 0547 02/12/22 0816  NA 142  --   --   --   --  145  K 5.9* 5.6* 5.4* 5.3* 5.0 5.3*  CL 120*  --   --   --   --  117*  CO2 17*  --   --   --   --  16*  GLUCOSE 91  --   --   --   --  97  BUN 40*  --   --   --   --  36*  CREATININE 1.95*  --   --   --   --  1.91*  CALCIUM 8.2*  --   --   --   --  8.5*   Recent Labs  Lab 02/11/22 1000  AST 24  ALT 23  ALKPHOS 121  BILITOT 0.5  PROT 6.4*  ALBUMIN 3.0*   Recent Labs  Lab 02/11/22 1000 02/12/22 0547  WBC 4.4 5.1  NEUTROABS 1.8  --   HGB 8.7* 9.3*  HCT 29.3* 29.7*  MCV 105.0* 100.0  PLT 192 185   No results for input(s): "CKTOTAL", "CKMB", "CKMBINDEX", "TROPONINI" in the last 168 hours. Recent Labs    02/11/22 1000 02/12/22 0547  LABPROT 18.6* 17.2*  INR 1.6* 1.4*   No results for input(s): "COLORURINE", "LABSPEC", "PHURINE", "GLUCOSEU",  "HGBUR", "BILIRUBINUR", "KETONESUR", "PROTEINUR", "UROBILINOGEN", "NITRITE", "LEUKOCYTESUR" in the last 72 hours.  Invalid input(s): "APPERANCEUR"     Component Value Date/Time   CHOL 126 02/12/2022 0137   TRIG 88 02/12/2022 0137   HDL 48 02/12/2022 0137   CHOLHDL 2.6 02/12/2022 0137   VLDL 18 02/12/2022 0137   LDLCALC 60 02/12/2022 0137   Lab Results  Component Value Date   HGBA1C 5.0 02/12/2022   No results found for: "LABOPIA", "COCAINSCRNUR", "LABBENZ", "AMPHETMU", "THCU", "LABBARB"  Recent Labs  Lab 02/11/22 1000  ETH <10    I have personally reviewed the radiological images below and agree with the radiology interpretations.  ECHOCARDIOGRAM COMPLETE  Result Date: 02/12/2022    ECHOCARDIOGRAM REPORT   Patient Name:   Gloria Mckay Date of Exam: 02/12/2022 Medical Rec #:  [id number]  Height:       63.0 in Accession #:    [id number] Weight:       122.6 lb Date of Birth:  July 27, 1941  BSA:          1.570 m Patient Age:  80 years   BP:           156/72 mmHg Patient Gender: F          HR:           66 bpm. Exam Location:  Inpatient Procedure: 2D Echo, 3D Echo, Cardiac Doppler and Color Doppler Indications:    Stroke. I63.9  History:        Patient has prior history of Echocardiogram examinations, most                 recent 12/03/2019. Abnormal ECG, TIA, Arrythmias:ATACH;                 Signs/Symptoms:Chest Pain.  Sonographer:    Roseanna Rainbow RDCS Referring Phys: [id number] [place]  1. Left ventricular ejection fraction, by estimation, is 60 to 65%. The left ventricle has normal function. The left ventricle has no regional wall motion abnormalities. Left ventricular diastolic parameters were normal.  2. Right ventricular systolic function is moderately reduced. The right ventricular size is moderately enlarged. There is moderately elevated pulmonary artery systolic pressure.  3. The mitral valve is normal in structure. Mild mitral valve regurgitation. No evidence of mitral  stenosis.  4. Calcified leaflet tips and chordal structures in RVOT. The tricuspid valve is abnormal. Tricuspid valve regurgitation is severe.  5. The aortic valve is tricuspid. There is mild calcification of the aortic valve. There is mild thickening of the aortic valve. Aortic valve regurgitation is not visualized. Aortic valve sclerosis is present, with no evidence of aortic valve stenosis.  6. The inferior vena cava is normal in size with greater than 50% respiratory variability, suggesting right atrial pressure of 3 mmHg. FINDINGS  Left Ventricle: Left ventricular ejection fraction, by estimation, is 60 to 65%. The left ventricle has normal function. The left ventricle has no regional wall motion abnormalities. The left ventricular internal cavity size was normal in size. There is  no left ventricular hypertrophy. Left ventricular diastolic parameters were normal. Right Ventricle: The right ventricular size is moderately enlarged. No increase in right ventricular wall thickness. Right ventricular systolic function is moderately reduced. There is moderately elevated pulmonary artery systolic pressure. The tricuspid  regurgitant velocity is 3.11 m/s, and with an assumed right atrial pressure of 15 mmHg, the estimated right ventricular systolic pressure is 33.3 mmHg. Left Atrium: Left atrial size was normal in size. Right Atrium: Right atrial size was normal in size. Pericardium: There is no evidence of pericardial effusion. Mitral Valve: The mitral valve is normal in structure. Mild mitral valve regurgitation. No evidence of mitral valve stenosis. Tricuspid Valve: Calcified leaflet tips and chordal structures in RVOT. The tricuspid valve is abnormal. Tricuspid valve regurgitation is severe. No evidence of tricuspid stenosis. Aortic Valve: The aortic valve is tricuspid. There is mild calcification of the aortic valve. There is mild thickening of the aortic valve. Aortic valve regurgitation is not visualized.  Aortic valve sclerosis is present, with no evidence of aortic valve stenosis. Pulmonic Valve: The pulmonic valve was normal in structure. Pulmonic valve regurgitation is trivial. No evidence of pulmonic stenosis. Aorta: The aortic root is normal in size and structure. Venous: The inferior vena cava is normal in size with greater than 50% respiratory variability, suggesting right atrial pressure of 3 mmHg. IAS/Shunts: No atrial level shunt detected by color flow Doppler.  LEFT VENTRICLE PLAX 2D LVIDd:         3.80 cm     Diastology  LVIDs:         2.30 cm     LV e' medial:    5.82 cm/s LV PW:         0.95 cm     LV E/e' medial:  11.2 LV IVS:        1.05 cm     LV e' lateral:   8.64 cm/s LVOT diam:     1.80 cm     LV E/e' lateral: 7.5 LV SV:         59 LV SV Index:   38 LVOT Area:     2.54 cm                             3D Volume EF: LV Volumes (MOD)           3D EF:        59 % LV vol d, MOD A2C: 47.2 ml LV EDV:       78 ml LV vol d, MOD A4C: 44.9 ml LV ESV:       32 ml LV vol s, MOD A2C: 14.0 ml LV SV:        47 ml LV vol s, MOD A4C: 14.0 ml LV SV MOD A2C:     33.2 ml LV SV MOD A4C:     44.9 ml LV SV MOD BP:      31.9 ml RIGHT VENTRICLE            IVC RV S prime:     9.11 cm/s  IVC diam: 1.80 cm TAPSE (M-mode): 2.1 cm LEFT ATRIUM             Index        RIGHT ATRIUM           Index LA diam:        2.80 cm 1.78 cm/m   RA Area:     13.60 cm LA Vol (A2C):   14.4 ml 9.17 ml/m   RA Volume:   31.50 ml  20.06 ml/m LA Vol (A4C):   28.6 ml 18.21 ml/m LA Biplane Vol: 19.9 ml 12.67 ml/m  AORTIC VALVE LVOT Vmax:   125.00 cm/s LVOT Vmean:  78.600 cm/s LVOT VTI:    0.233 m  AORTA Ao Root diam: 3.10 cm Ao Asc diam:  3.20 cm MITRAL VALVE               TRICUSPID VALVE MV Area (PHT): 3.31 cm    TR Peak grad:   38.7 mmHg MV Decel Time: 229 msec    TR Vmax:        311.00 cm/s MV E velocity: 64.96 cm/s MV A velocity: 79.00 cm/s  SHUNTS MV E/A ratio:  0.82        Systemic VTI:  0.23 m                            Systemic Diam:  1.80 cm Jenkins Rouge MD Electronically signed by Jenkins Rouge MD Signature Date/Time: 02/12/2022/2:28:17 PM    Final    EEG adult  Result Date: 02/12/2022 Lora Havens, MD     02/12/2022  8:18 AM Patient Name: Nura Cahoon MRN: [id number] Epilepsy Attending: Lora Havens Referring Physician/Provider: Katy Apo, NP Date: 01/11/2022 Duration: 25.24 mins Patient history: 81 year old patient with history of DVT on warfarin, atrial  fibrillation, TIA, seizures and myelodysplastic syndrome presents with sudden onset right sided weakness, right facial droop and dysarthria.  EEG to evaluate for seizure. Level of alertness: Awake AEDs during EEG study: None Technical aspects: This EEG study was done with scalp electrodes positioned according to the 10-20 International system of electrode placement. Electrical activity was acquired at a sampling rate of '500Hz'$  and reviewed with a high frequency filter of '70Hz'$  and a low frequency filter of '1Hz'$ . EEG data were recorded continuously and digitally stored. Description: The posterior dominant rhythm consists of 6.5 Hz activity of moderate voltage (25-35 uV) seen predominantly in posterior head regions, symmetric and reactive to eye opening and eye closing. EEG showed continuous generalized 5 to 7 Hz theta slowing. Hyperventilation and photic stimulation were not performed.   ABNORMALITY - Continuous slow, generalized IMPRESSION: This study is suggestive of moderate diffuse encephalopathy, nonspecific etiology. No seizures or epileptiform discharges were seen throughout the recording. Lora Havens   MR BRAIN WO CONTRAST  Result Date: 02/11/2022 CLINICAL DATA:  Neuro deficit, acute, stroke suspected EXAM: MRI HEAD WITHOUT CONTRAST MRA HEAD WITHOUT CONTRAST MRA NECK WITHOUT CONTRAST TECHNIQUE: Multiplanar, multiecho pulse sequences of the brain and surrounding structures were obtained without intravenous contrast. Angiographic images of the Circle of Willis  were obtained using MRA technique without intravenous contrast. Angiographic images of the neck were obtained using MRA technique without intravenous contrast. Carotid stenosis measurements (when applicable) are obtained utilizing NASCET criteria, using the distal internal carotid diameter as the denominator. COMPARISON:  None Available. FINDINGS: MRI HEAD Brain: There is no acute infarction or intracranial hemorrhage. There is no intracranial mass, mass effect, or edema. There is no hydrocephalus or extra-axial fluid collection. Patchy and confluent areas of T2 hyperintensity in the supratentorial white matter are nonspecific may reflect mild to moderate chronic microvascular ischemic changes. A punctate focus of susceptibility in the dorsal pons likely reflects chronic microhemorrhage. Prominence of the ventricles and sulci reflects minor parenchymal volume loss. Vascular: Major vessel flow voids at the skull base are preserved. Skull and upper cervical spine: Normal marrow signal is preserved. Sinuses/Orbits: Minor mucosal thickening. Bilateral lens replacements. Other: Sella is unremarkable.  Mastoid air cells are clear. MRA HEAD Intracranial internal carotid arteries are patent. Middle and anterior cerebral arteries are patent. Intracranial vertebral arteries, basilar artery, posterior cerebral arteries are patent. Right posterior communicating artery is present. Possible diminutive left posterior communicating artery also noted. There is no significant stenosis or aneurysm. MRA NECK Common, internal, and external carotid arteries are patent. Codominant vertebral arteries are patent. No hemodynamically significant stenosis or evidence of dissection. IMPRESSION: No acute infarction, hemorrhage, or mass. Mild to moderate chronic microvascular ischemic changes. No large vessel occlusion or hemodynamically significant stenosis. Electronically Signed   By: Macy Mis M.D.   On: 02/11/2022 12:00   MR ANGIO HEAD  WO CONTRAST  Result Date: 02/11/2022 CLINICAL DATA:  Neuro deficit, acute, stroke suspected EXAM: MRI HEAD WITHOUT CONTRAST MRA HEAD WITHOUT CONTRAST MRA NECK WITHOUT CONTRAST TECHNIQUE: Multiplanar, multiecho pulse sequences of the brain and surrounding structures were obtained without intravenous contrast. Angiographic images of the Circle of Willis were obtained using MRA technique without intravenous contrast. Angiographic images of the neck were obtained using MRA technique without intravenous contrast. Carotid stenosis measurements (when applicable) are obtained utilizing NASCET criteria, using the distal internal carotid diameter as the denominator. COMPARISON:  None Available. FINDINGS: MRI HEAD Brain: There is no acute infarction or intracranial hemorrhage. There is no intracranial mass,  mass effect, or edema. There is no hydrocephalus or extra-axial fluid collection. Patchy and confluent areas of T2 hyperintensity in the supratentorial white matter are nonspecific may reflect mild to moderate chronic microvascular ischemic changes. A punctate focus of susceptibility in the dorsal pons likely reflects chronic microhemorrhage. Prominence of the ventricles and sulci reflects minor parenchymal volume loss. Vascular: Major vessel flow voids at the skull base are preserved. Skull and upper cervical spine: Normal marrow signal is preserved. Sinuses/Orbits: Minor mucosal thickening. Bilateral lens replacements. Other: Sella is unremarkable.  Mastoid air cells are clear. MRA HEAD Intracranial internal carotid arteries are patent. Middle and anterior cerebral arteries are patent. Intracranial vertebral arteries, basilar artery, posterior cerebral arteries are patent. Right posterior communicating artery is present. Possible diminutive left posterior communicating artery also noted. There is no significant stenosis or aneurysm. MRA NECK Common, internal, and external carotid arteries are patent. Codominant  vertebral arteries are patent. No hemodynamically significant stenosis or evidence of dissection. IMPRESSION: No acute infarction, hemorrhage, or mass. Mild to moderate chronic microvascular ischemic changes. No large vessel occlusion or hemodynamically significant stenosis. Electronically Signed   By: Macy Mis M.D.   On: 02/11/2022 12:00   MR ANGIO NECK WO CONTRAST  Result Date: 02/11/2022 CLINICAL DATA:  Neuro deficit, acute, stroke suspected EXAM: MRI HEAD WITHOUT CONTRAST MRA HEAD WITHOUT CONTRAST MRA NECK WITHOUT CONTRAST TECHNIQUE: Multiplanar, multiecho pulse sequences of the brain and surrounding structures were obtained without intravenous contrast. Angiographic images of the Circle of Willis were obtained using MRA technique without intravenous contrast. Angiographic images of the neck were obtained using MRA technique without intravenous contrast. Carotid stenosis measurements (when applicable) are obtained utilizing NASCET criteria, using the distal internal carotid diameter as the denominator. COMPARISON:  None Available. FINDINGS: MRI HEAD Brain: There is no acute infarction or intracranial hemorrhage. There is no intracranial mass, mass effect, or edema. There is no hydrocephalus or extra-axial fluid collection. Patchy and confluent areas of T2 hyperintensity in the supratentorial white matter are nonspecific may reflect mild to moderate chronic microvascular ischemic changes. A punctate focus of susceptibility in the dorsal pons likely reflects chronic microhemorrhage. Prominence of the ventricles and sulci reflects minor parenchymal volume loss. Vascular: Major vessel flow voids at the skull base are preserved. Skull and upper cervical spine: Normal marrow signal is preserved. Sinuses/Orbits: Minor mucosal thickening. Bilateral lens replacements. Other: Sella is unremarkable.  Mastoid air cells are clear. MRA HEAD Intracranial internal carotid arteries are patent. Middle and anterior  cerebral arteries are patent. Intracranial vertebral arteries, basilar artery, posterior cerebral arteries are patent. Right posterior communicating artery is present. Possible diminutive left posterior communicating artery also noted. There is no significant stenosis or aneurysm. MRA NECK Common, internal, and external carotid arteries are patent. Codominant vertebral arteries are patent. No hemodynamically significant stenosis or evidence of dissection. IMPRESSION: No acute infarction, hemorrhage, or mass. Mild to moderate chronic microvascular ischemic changes. No large vessel occlusion or hemodynamically significant stenosis. Electronically Signed   By: Macy Mis M.D.   On: 02/11/2022 12:00   CT HEAD CODE STROKE WO CONTRAST  Result Date: 02/11/2022 CLINICAL DATA:  Code stroke.  Neuro deficit, acute, stroke suspected EXAM: CT HEAD WITHOUT CONTRAST TECHNIQUE: Contiguous axial images were obtained from the base of the skull through the vertex without intravenous contrast. RADIATION DOSE REDUCTION: This exam was performed according to the departmental dose-optimization program which includes automated exposure control, adjustment of the mA and/or kV according to patient size and/or use of iterative  reconstruction technique. COMPARISON:  December 2022 FINDINGS: Brain: No acute intracranial hemorrhage, mass effect, or edema. No new loss of gray-white differentiation. Patchy hypoattenuation in the supratentorial white matter is nonspecific but may reflect similar chronic microvascular ischemic changes. Low density within the pons is likely artifactual. There is no extra-axial collection. Vascular: No hyperdense vessel. Skull: Unremarkable. Sinuses/Orbits: Patchy paranasal sinus mucosal thickening. Bilateral lens replacements. Other: Mastoid air cells are clear. ASPECTS ([place] Stroke Program Early CT Score) - Ganglionic level infarction (caudate, lentiform nuclei, internal capsule, insula, M1-M3 cortex): 7 -  Supraganglionic infarction (M4-M6 cortex): 3 Total score (0-10 with 10 being normal): 10 IMPRESSION: There is no acute intracranial hemorrhage or evidence of acute infarction. ASPECT score is 10. These results were communicated to Dr. Theda Sers at 9:43 am on 02/11/2022 by text page via the [facility] messaging system. Electronically Signed   By: Macy Mis M.D.   On: 02/11/2022 09:44     PHYSICAL EXAM  Temp:  [97.9 F (36.6 C)-99.7 F (37.6 C)] 98.6 F (37 C) (06/16 1630) Pulse Rate:  [67-80] 71 (06/16 1630) Resp:  [16-21] 18 (06/16 1630) BP: (127-176)/(63-117) 127/70 (06/16 1630) SpO2:  [95 %-98 %] 98 % (06/16 1630)  General - Well nourished, well developed, in no apparent distress.  Ophthalmologic - fundi not visualized due to noncooperation.  Cardiovascular - Regular rhythm and rate.  Mental Status -  Level of arousal and orientation to time, place, and person were intact. Language including expression, naming, repetition, comprehension was assessed and found intact. Fund of Knowledge was assessed and was intact.  Cranial Nerves II - XII - II - Visual field intact OU. III, IV, VI - Extraocular movements intact. V - Facial sensation intact bilaterally. VII - Facial movement intact bilaterally. VIII - Hearing & vestibular intact bilaterally. X - Palate elevates symmetrically. XI - Chin turning & shoulder shrug intact bilaterally. XII - Tongue protrusion intact.  Motor Strength - The patient's strength was normal in all extremities and pronator drift was absent.  Bulk was normal and fasciculations were absent.   Motor Tone - Muscle tone was assessed at the neck and appendages and was normal.  Reflexes - The patient's reflexes were symmetrical in all extremities and she had no pathological reflexes.  Sensory - Light touch, temperature/pinprick were assessed and were symmetrical.    Coordination - The patient had normal movements in the hands with no ataxia or dysmetria.  Tremor  was absent.  Gait and Station - deferred.   ASSESSMENT/PLAN Gloria Mckay is a 81 y.o. female with history of hypertension, MDS, CKD 4, DVT/PE on Coumadin admitted for transient right-sided weakness, slurred speech, right facial droop. No tPA given due to symptom resolved.    Left brain TIA CT no acute abnormality MRI no acute infarct MRA head and neck unremarkable EEG no seizure, moderate diffuse encephalopathy 2D Echo EF 60 to 65% LDL 60 HgbA1c 5.0 SCDs and warfarin for VTE prophylaxis warfarin daily prior to admission, now on warfarin daily, target INR goal 2-3. No lovenox bridge needed per Dr. Maylene Roes. Patient counseled to be compliant with her antithrombotic medications Ongoing aggressive stroke risk factor management Therapy recommendations: SNF Disposition: Pending  History of DVT/PE On Coumadin but with fluctuate INRs INR 1.6-> 1.4 Pharmacy adjusting Coumadin dose Discussed with family and patient regarding Coumadin use with difficulty INR regulation.  Although Coumadin preferred in the CKD patient, however patient IR frequently outside target range, DOAC with renal dosing may be a consideration.  Daughter  and son seems interested, however, patient hesitant to change at this time.  She preferred to discuss with her hematologist and nephrologist before making decision.   Hypertension Stable Long term BP goal normotensive  Lipid management Home meds: None LDL 60, goal < 70 No statin needed given advanced age and LDL at goal  Other Stroke Risk Factors Advanced age  Other Active Problems CKD 4, creatinine 1.95  Hospital day # 1  Neurology will sign off. Please call with questions. Pt will follow up with stroke clinic NP at [facility] in about 4 weeks. Thanks for the consult.   Rosalin Hawking, MD PhD Stroke Neurology 02/12/2022 5:21 PM    To contact Stroke Continuity provider, please refer to [email or web address] After hours, contact General Neurology

## 2022-02-12 NOTE — Progress Notes (Signed)
2D echo attempted, but speech therapy with patient. Will try later.

## 2022-02-12 NOTE — Procedures (Signed)
Patient Name: Gloria Mckay  MRN: [id number]  Epilepsy Attending: Lora Havens  Referring Physician/Provider: Katy Apo, NP  Date: 01/11/2022 Duration: 25.24 mins  Patient history: 81 year old patient with history of DVT on warfarin, atrial fibrillation, TIA, seizures and myelodysplastic syndrome presents with sudden onset right sided weakness, right facial droop and dysarthria.  EEG to evaluate for seizure.  Level of alertness: Awake  AEDs during EEG study: None  Technical aspects: This EEG study was done with scalp electrodes positioned according to the 10-20 International system of electrode placement. Electrical activity was acquired at a sampling rate of '500Hz'$  and reviewed with a high frequency filter of '70Hz'$  and a low frequency filter of '1Hz'$ . EEG data were recorded continuously and digitally stored.   Description: The posterior dominant rhythm consists of 6.5 Hz activity of moderate voltage (25-35 uV) seen predominantly in posterior head regions, symmetric and reactive to eye opening and eye closing. EEG showed continuous generalized 5 to 7 Hz theta slowing. Hyperventilation and photic stimulation were not performed.     ABNORMALITY - Continuous slow, generalized  IMPRESSION: This study is suggestive of moderate diffuse encephalopathy, nonspecific etiology. No seizures or epileptiform discharges were seen throughout the recording.  [name] [name]

## 2022-02-12 NOTE — NC FL2 (Signed)
[place] LEVEL OF CARE SCREENING TOOL     IDENTIFICATION  Patient Name: Gloria Mckay Birthdate: Jun 28, 1941 Sex: female Admission Date (Current Location): 02/11/2022  [facility] and [place] Number:  Herbalist and Address:  [facility], [place] [address] [place], [place] [address]      Provider Number: [id number]  Attending Physician Name and Address:  Dessa Phi, DO  Relative Name and Phone Number:       Current Level of Care: Hospital Recommended Level of Care: [place] Prior Approval Number:    Date Approved/Denied:   PASRR Number:    Discharge Plan: SNF    Current Diagnoses: Patient Active Problem List   Diagnosis Date Noted   TIA (transient ischemic attack) 02/11/2022   Hypertension    CKD (chronic kidney disease) stage 4, GFR 15-29 ml/min (HCC)    Hyperkalemia    Normal anion gap metabolic acidosis    Enterococcus UTI 02/23/2021   Thrombocytopenia ([place]) 02/20/2021   Chronic antibiotic suppression 02/20/2021   Anemia of chronic kidney failure, stage 3 (moderate) ([place]) 02/20/2021   Encounter for Medicare annual wellness exam 07/14/2020   Dysuria 07/14/2020   B12 deficiency 02/08/2020   Acute abdominal pain 02/08/2020   Myelodysplastic syndrome with 5 q minus ([place]) 08/28/2018   Accidental fall 08/11/2018   Headache 06/16/2018   Atrial tachycardia ([place]) 05/08/2018   Pain in right hip 03/21/2018   Vertebral compression fracture ([place]) 03/21/2018   Anemia of chronic disease 11/08/2017   Bronchiectasis with acute lower respiratory infection ([place]) 10/28/2017   Vocal tremor 10/13/2017   Chronic infection of prosthetic knee, subsequent encounter 09/06/2017   Biclonal gammopathy 08/12/2017   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[place]) 08/12/2017   Pseudomonas infection 04/25/2017   Long term (current) use of anticoagulants [Z79.01] 02/24/2017   Primary hypercoagulable state ([place]) [D68.59] 02/24/2017   Atrial fibrillation  ([place]) [I48.91] 02/24/2017   DDD (degenerative disc disease), cervical 02/11/2017   Elevated MCV 01/11/2017   History of partial gastrectomy 01/11/2017   Benign hypertension with CKD (chronic kidney disease) stage III ([place]) 10/28/2016   History of arthroplasty of left knee 10/25/2016   Plantar fasciitis, left 10/22/2016   Fibroma of tongue 06/14/2016   Postmenopausal osteoporosis 02/06/2016   History of pulmonary embolism 01/30/2016   CKD (chronic kidney disease) stage 3, GFR 30-59 ml/min (HCC) 08/19/2015   Dry eye syndrome of both lacrimal glands 08/19/2015   Status post cataract extraction and insertion of intraocular lens of left eye 08/19/2015   Chronic hypernatremia 08/19/2015   Cortical age-related cataract of right eye 08/19/2015   Nuclear sclerotic cataract of right eye 08/19/2015   CKD stage G3b/A1, GFR 30-44 and albumin creatinine ratio <30 mg/g ([place]) 08/19/2015   Iron deficiency anemia due to chronic blood loss 02/21/2015   AMD (age-related macular degeneration), bilateral 02/05/2015   Congenital hypertrophy of retinal pigment epithelium 02/05/2015   Endothelial corneal dystrophy 02/05/2015   Cornea guttata 02/05/2015   Lumbar spinal stenosis 05/09/2014   S/P colonoscopy 01/23/2014   S/P endoscopy 01/23/2014   Gastric bezoar 08/06/2013   History of MRSA infection 12/30/2012   Long term current use of anticoagulant therapy 08/23/2011   Mild intermittent asthma without complication 35/00/9381   Chest pain 01/15/2010   Esophageal reflux 11/05/2009   Gastroesophageal reflux disease without esophagitis 11/05/2009   DDD (degenerative disc disease), lumbar 09/19/2009   Infection and inflammatory reaction due to internal joint prosthesis ([place]) 08/08/2009   Vitamin D deficiency  05/12/2009   Chronic nausea 02/12/2009   History of DVT of lower extremity 02/12/2009   Recurrent major depressive disorder, in partial remission ([place]) 02/12/2009    Orientation RESPIRATION BLADDER  Height & Weight     Self, Time, Situation, Place  Normal Incontinent Weight: 122 lb 9.2 oz (55.6 kg) Height:  '5\' 3"'$  (160 cm)  BEHAVIORAL SYMPTOMS/MOOD NEUROLOGICAL BOWEL NUTRITION STATUS      Continent Diet (heart healthy)  AMBULATORY STATUS COMMUNICATION OF NEEDS Skin   Limited Assist Verbally Normal                       Personal Care Assistance Level of Assistance  Bathing, Feeding, Dressing Bathing Assistance: Limited assistance Feeding assistance: Independent Dressing Assistance: Limited assistance     Functional Limitations Info             SPECIAL CARE FACTORS FREQUENCY  PT (By licensed PT), OT (By licensed OT)     PT Frequency: 5x/wk OT Frequency: 5x/wk            Contractures Contractures Info: Not present    Additional Factors Info  Code Status, Allergies, Psychotropic Code Status Info: DNR Allergies Info: Avocado, Benadryl (Diphenhydramine), Cephalosporins, Latex, Other, Valium (Diazepam), Zinacef (Cefuroxime), Methylpyrrolidone, Norvasc (Amlodipine), Penicillins, Quinolones, Roxicodone (Oxycodone), Stadol (Butorphanol), Sulfa Antibiotics, Tomato, Voltaren (Diclofenac), Zofran (Ondansetron), Altace (Ramipril), Iodine I 131 Tositumomab, Lodine (Etodolac), Nexium (Esomeprazole Magnesium), Protonix (Pantoprazole), Zestril (Lisinopril), Ace Inhibitors, Cipro (Ciprofloxacin Hcl), Codeine Psychotropic Info: Lexapro '5mg'$  daily at bed         Current Medications (02/12/2022):  This is the current hospital active medication list Current Facility-Administered Medications  Medication Dose Route Frequency Provider Last Rate Last Admin   acetaminophen (TYLENOL) tablet 650 mg  650 mg Oral Q4H PRN Agbata, Tochukwu, MD   650 mg at 02/11/22 2140   Or   acetaminophen (TYLENOL) 160 MG/5ML solution 650 mg  650 mg Per Tube Q4H PRN Agbata, Tochukwu, MD       Or   acetaminophen (TYLENOL) suppository 650 mg  650 mg Rectal Q4H PRN Agbata, Tochukwu, MD       acetaminophen  (TYLENOL) tablet 500 mg  500 mg Oral q AM Agbata, Tochukwu, MD   500 mg at 02/12/22 0653   allopurinol (ZYLOPRIM) tablet 100 mg  100 mg Oral QHS Agbata, Tochukwu, MD   100 mg at 02/11/22 2140   atorvastatin (LIPITOR) tablet 40 mg  40 mg Oral Daily Agbata, Tochukwu, MD   40 mg at 02/12/22 0824   carvedilol (COREG) tablet 25 mg  25 mg Oral BID WC Agbata, Tochukwu, MD   25 mg at 02/12/22 0824   escitalopram (LEXAPRO) tablet 5 mg  5 mg Oral QHS Agbata, Tochukwu, MD   5 mg at 02/11/22 2140   estradiol (ESTRACE) vaginal cream 1 g  1 g Vaginal Q M,W,F Agbata, Tochukwu, MD   1 g at 02/12/22 6237   hydrALAZINE (APRESOLINE) tablet 25 mg  25 mg Oral TID Dessa Phi, DO       nitrofurantoin (macrocrystal-monohydrate) (MACROBID) capsule 100 mg  100 mg Oral Q12H Dessa Phi, DO       pantoprazole (PROTONIX) EC tablet 40 mg  40 mg Oral Daily Agbata, Tochukwu, MD   40 mg at 02/12/22 0824   pregabalin (LYRICA) capsule 25 mg  25 mg Oral BID Dessa Phi, DO       sodium bicarbonate tablet 650 mg  650 mg Oral TID Collier Bullock, MD  650 mg at 02/12/22 0824   tiZANidine (ZANAFLEX) tablet 2 mg  2 mg Oral QHS Dessa Phi, DO       topiramate (TOPAMAX) tablet 100 mg  100 mg Oral BID Dessa Phi, DO       vitamin B-12 (CYANOCOBALAMIN) tablet 5,000 mcg  5,000 mcg Oral q AM Francine Graven, Tochukwu, MD   5,000 mcg at 02/12/22 7579   Warfarin - Pharmacist Dosing Inpatient   Does not apply [place], Tochukwu, MD         Discharge Medications: Please see discharge summary for a list of discharge medications.  Relevant Imaging Results:  Relevant Lab Results:   Additional Information SS#: [id number]  [name], LCSW

## 2022-02-12 NOTE — Progress Notes (Addendum)
PROGRESS NOTE    Gloria Mckay  VEL:[id number] DOB: Mar 12, 1941 DOA: 02/11/2022 PCP: Merryl Hacker, No     Brief Narrative:  Gloria Mckay is a 81 y.o. female with medical history significant for hypertension, myelodysplastic syndrome, stage IV chronic kidney disease, anemia of chronic disease, history of DVT on coumadin who was brought into the ER from the skilled nursing facility where she resides by EMS for evaluation of slurred speech and right-sided weakness.  Patient's last known well was about 8 AM on the day of admission.  At about 830 AM, she called her daughter because her speech was slurred and she had right-sided weakness.  Her daughter was able to reach out to the nursing home staff who called EMS after they noticed the patient had right-sided weakness and a right facial droop. She was a code stroke upon arrival to the ER and had an NIH SS score of 7.  Initial CT scan of the head did not show an acute bleed.  Patient not a candidate for tPA because she is on Coumadin and not a candidate for IR because her symptoms were improving.  New events last 24 hours / Subjective: Patient states that her speech has improved, right-sided weakness has also improved.  However her symptoms are not back to baseline yet.  Assessment & Plan:   Principal Problem:   TIA (transient ischemic attack) Active Problems:   History of DVT of lower extremity   Anemia of chronic disease   Hypertension   CKD (chronic kidney disease) stage 4, GFR 15-29 ml/min (HCC)   Hyperkalemia   Normal anion gap metabolic acidosis   TIA with right-sided weakness and slurred speech -Symptoms improved -CT head negative for acute intracranial hemorrhage or acute infarction -MRI brain, MRA head and neck negative for acute infarction, hemorrhage or mass.  No large vessel occlusion or hemodynamically significant stenosis -EEG showed moderate diffuse encephalopathy, nonspecific etiology.  No seizures or epileptiform discharges  seen -Echocardiogram pending -PT OT SLP ordered -Stroke team following -Continue Coumadin, lipitor   Hyperkalemia -Lokelma -Repeat BMP  CKD stage IV -Baseline creatinine 2 -Stable  Hypertension -Coreg, hydralazine  History MDS -Followed by oncology at [facility]  History DVT left lower extremity Paroxysmal A Fib  -Coumadin  E Coli UTI, POA -Was diagnosed with E Coli UTI at outside facility (see under media, scanned) -Start macrobid (has many drug allergies)    DVT prophylaxis: Coumadin   Code Status: DNR Family Communication: Daughter  Disposition Plan:  Status is: Inpatient Remains inpatient appropriate because: stroke work up   Consultants:  Neurology   Antimicrobials:  Anti-infectives (From admission, onward)    None        Objective: Vitals:   02/11/22 2118 02/11/22 2333 02/12/22 0438 02/12/22 0853  BP: (!) 160/84 (!) 142/63 (!) 150/71 (!) 156/72  Pulse: 75 73 67 71  Resp: (!) '21 20 16 18  '$ Temp: 98.2 F (36.8 C) 98.7 F (37.1 C) 98.8 F (37.1 C) 99.7 F (37.6 C)  TempSrc: Oral Oral Oral Oral  SpO2: 96% 96% 97% 95%  Weight:      Height:        Intake/Output Summary (Last 24 hours) at 02/12/2022 1249 Last data filed at 02/12/2022 0656 Gross per 24 hour  Intake 60 ml  Output 1450 ml  Net -1390 ml   Filed Weights   02/11/22 0900  Weight: 55.6 kg    Examination:  General exam: Appears calm and comfortable  Respiratory system: Clear to auscultation. Respiratory  effort normal. No respiratory distress. No conversational dyspnea.  Cardiovascular system: S1 & S2 heard, RRR. No murmurs. No pedal edema. Gastrointestinal system: Abdomen is nondistended, soft and nontender. Normal bowel sounds heard. Central nervous system: Alert and oriented. No focal neurological deficits. Speech clear. Strength 5/5  Extremities: LLE with chronic edema  Psychiatry: Judgement and insight appear normal. Mood & affect appropriate.   Data Reviewed: I have  personally reviewed following labs and imaging studies  CBC: Recent Labs  Lab 02/11/22 1000 03-04-2022 0547  WBC 4.4 5.1  NEUTROABS 1.8  --   HGB 8.7* 9.3*  HCT 29.3* 29.7*  MCV 105.0* 100.0  PLT 192 542   Basic Metabolic Panel: Recent Labs  Lab 02/11/22 1000 02/11/22 1426 02/11/22 2006 03-04-2022 0137 03-04-22 0547 03/04/2022 0816  NA 142  --   --   --   --  145  K 5.9* 5.6* 5.4* 5.3* 5.0 5.3*  CL 120*  --   --   --   --  117*  CO2 17*  --   --   --   --  16*  GLUCOSE 91  --   --   --   --  97  BUN 40*  --   --   --   --  36*  CREATININE 1.95*  --   --   --   --  1.91*  CALCIUM 8.2*  --   --   --   --  8.5*   GFR: Estimated Creatinine Clearance: 19.8 mL/min (A) (by C-G formula based on SCr of 1.91 mg/dL (H)). Liver Function Tests: Recent Labs  Lab 02/11/22 1000  AST 24  ALT 23  ALKPHOS 121  BILITOT 0.5  PROT 6.4*  ALBUMIN 3.0*   No results for input(s): "LIPASE", "AMYLASE" in the last 168 hours. No results for input(s): "AMMONIA" in the last 168 hours. Coagulation Profile: Recent Labs  Lab 02/11/22 1000 2022/03/04 0547  INR 1.6* 1.4*   Cardiac Enzymes: No results for input(s): "CKTOTAL", "CKMB", "CKMBINDEX", "TROPONINI" in the last 168 hours. BNP (last 3 results) No results for input(s): "PROBNP" in the last 8760 hours. HbA1C: Recent Labs    March 04, 2022 0137  HGBA1C 5.0   CBG: Recent Labs  Lab 02/11/22 0921 02/11/22 1523 02/11/22 1744 02/11/22 1810  GLUCAP 77 87 79 102*   Lipid Profile: Recent Labs    2022-03-04 0137  CHOL 126  HDL 48  LDLCALC 60  TRIG 88  CHOLHDL 2.6   Thyroid Function Tests: No results for input(s): "TSH", "T4TOTAL", "FREET4", "T3FREE", "THYROIDAB" in the last 72 hours. Anemia Panel: No results for input(s): "VITAMINB12", "FOLATE", "FERRITIN", "TIBC", "IRON", "RETICCTPCT" in the last 72 hours. Sepsis Labs: No results for input(s): "PROCALCITON", "LATICACIDVEN" in the last 168 hours.  No results found for this or any  previous visit (from the past 240 hour(s)).    Radiology Studies: EEG adult  Result Date: 03-04-2022 Lora Havens, MD     March 04, 2022  8:18 AM Patient Name: Karen Huhta MRN: [id number] Epilepsy Attending: Lora Havens Referring Physician/Provider: Katy Apo, NP Date: 01/11/2022 Duration: 25.24 mins Patient history: 81 year old patient with history of DVT on warfarin, atrial fibrillation, TIA, seizures and myelodysplastic syndrome presents with sudden onset right sided weakness, right facial droop and dysarthria.  EEG to evaluate for seizure. Level of alertness: Awake AEDs during EEG study: None Technical aspects: This EEG study was done with scalp electrodes positioned according to the 10-20  International system of electrode placement. Electrical activity was acquired at a sampling rate of '500Hz'$  and reviewed with a high frequency filter of '70Hz'$  and a low frequency filter of '1Hz'$ . EEG data were recorded continuously and digitally stored. Description: The posterior dominant rhythm consists of 6.5 Hz activity of moderate voltage (25-35 uV) seen predominantly in posterior head regions, symmetric and reactive to eye opening and eye closing. EEG showed continuous generalized 5 to 7 Hz theta slowing. Hyperventilation and photic stimulation were not performed.   ABNORMALITY - Continuous slow, generalized IMPRESSION: This study is suggestive of moderate diffuse encephalopathy, nonspecific etiology. No seizures or epileptiform discharges were seen throughout the recording. Lora Havens   MR BRAIN WO CONTRAST  Result Date: 02/11/2022 CLINICAL DATA:  Neuro deficit, acute, stroke suspected EXAM: MRI HEAD WITHOUT CONTRAST MRA HEAD WITHOUT CONTRAST MRA NECK WITHOUT CONTRAST TECHNIQUE: Multiplanar, multiecho pulse sequences of the brain and surrounding structures were obtained without intravenous contrast. Angiographic images of the Circle of Willis were obtained using MRA technique without intravenous  contrast. Angiographic images of the neck were obtained using MRA technique without intravenous contrast. Carotid stenosis measurements (when applicable) are obtained utilizing NASCET criteria, using the distal internal carotid diameter as the denominator. COMPARISON:  None Available. FINDINGS: MRI HEAD Brain: There is no acute infarction or intracranial hemorrhage. There is no intracranial mass, mass effect, or edema. There is no hydrocephalus or extra-axial fluid collection. Patchy and confluent areas of T2 hyperintensity in the supratentorial white matter are nonspecific may reflect mild to moderate chronic microvascular ischemic changes. A punctate focus of susceptibility in the dorsal pons likely reflects chronic microhemorrhage. Prominence of the ventricles and sulci reflects minor parenchymal volume loss. Vascular: Major vessel flow voids at the skull base are preserved. Skull and upper cervical spine: Normal marrow signal is preserved. Sinuses/Orbits: Minor mucosal thickening. Bilateral lens replacements. Other: Sella is unremarkable.  Mastoid air cells are clear. MRA HEAD Intracranial internal carotid arteries are patent. Middle and anterior cerebral arteries are patent. Intracranial vertebral arteries, basilar artery, posterior cerebral arteries are patent. Right posterior communicating artery is present. Possible diminutive left posterior communicating artery also noted. There is no significant stenosis or aneurysm. MRA NECK Common, internal, and external carotid arteries are patent. Codominant vertebral arteries are patent. No hemodynamically significant stenosis or evidence of dissection. IMPRESSION: No acute infarction, hemorrhage, or mass. Mild to moderate chronic microvascular ischemic changes. No large vessel occlusion or hemodynamically significant stenosis. Electronically Signed   By: Macy Mis M.D.   On: 02/11/2022 12:00   MR ANGIO HEAD WO CONTRAST  Result Date: 02/11/2022 CLINICAL DATA:   Neuro deficit, acute, stroke suspected EXAM: MRI HEAD WITHOUT CONTRAST MRA HEAD WITHOUT CONTRAST MRA NECK WITHOUT CONTRAST TECHNIQUE: Multiplanar, multiecho pulse sequences of the brain and surrounding structures were obtained without intravenous contrast. Angiographic images of the Circle of Willis were obtained using MRA technique without intravenous contrast. Angiographic images of the neck were obtained using MRA technique without intravenous contrast. Carotid stenosis measurements (when applicable) are obtained utilizing NASCET criteria, using the distal internal carotid diameter as the denominator. COMPARISON:  None Available. FINDINGS: MRI HEAD Brain: There is no acute infarction or intracranial hemorrhage. There is no intracranial mass, mass effect, or edema. There is no hydrocephalus or extra-axial fluid collection. Patchy and confluent areas of T2 hyperintensity in the supratentorial white matter are nonspecific may reflect mild to moderate chronic microvascular ischemic changes. A punctate focus of susceptibility in the dorsal pons likely reflects chronic microhemorrhage.  Prominence of the ventricles and sulci reflects minor parenchymal volume loss. Vascular: Major vessel flow voids at the skull base are preserved. Skull and upper cervical spine: Normal marrow signal is preserved. Sinuses/Orbits: Minor mucosal thickening. Bilateral lens replacements. Other: Sella is unremarkable.  Mastoid air cells are clear. MRA HEAD Intracranial internal carotid arteries are patent. Middle and anterior cerebral arteries are patent. Intracranial vertebral arteries, basilar artery, posterior cerebral arteries are patent. Right posterior communicating artery is present. Possible diminutive left posterior communicating artery also noted. There is no significant stenosis or aneurysm. MRA NECK Common, internal, and external carotid arteries are patent. Codominant vertebral arteries are patent. No hemodynamically significant  stenosis or evidence of dissection. IMPRESSION: No acute infarction, hemorrhage, or mass. Mild to moderate chronic microvascular ischemic changes. No large vessel occlusion or hemodynamically significant stenosis. Electronically Signed   By: Macy Mis M.D.   On: 02/11/2022 12:00   MR ANGIO NECK WO CONTRAST  Result Date: 02/11/2022 CLINICAL DATA:  Neuro deficit, acute, stroke suspected EXAM: MRI HEAD WITHOUT CONTRAST MRA HEAD WITHOUT CONTRAST MRA NECK WITHOUT CONTRAST TECHNIQUE: Multiplanar, multiecho pulse sequences of the brain and surrounding structures were obtained without intravenous contrast. Angiographic images of the Circle of Willis were obtained using MRA technique without intravenous contrast. Angiographic images of the neck were obtained using MRA technique without intravenous contrast. Carotid stenosis measurements (when applicable) are obtained utilizing NASCET criteria, using the distal internal carotid diameter as the denominator. COMPARISON:  None Available. FINDINGS: MRI HEAD Brain: There is no acute infarction or intracranial hemorrhage. There is no intracranial mass, mass effect, or edema. There is no hydrocephalus or extra-axial fluid collection. Patchy and confluent areas of T2 hyperintensity in the supratentorial white matter are nonspecific may reflect mild to moderate chronic microvascular ischemic changes. A punctate focus of susceptibility in the dorsal pons likely reflects chronic microhemorrhage. Prominence of the ventricles and sulci reflects minor parenchymal volume loss. Vascular: Major vessel flow voids at the skull base are preserved. Skull and upper cervical spine: Normal marrow signal is preserved. Sinuses/Orbits: Minor mucosal thickening. Bilateral lens replacements. Other: Sella is unremarkable.  Mastoid air cells are clear. MRA HEAD Intracranial internal carotid arteries are patent. Middle and anterior cerebral arteries are patent. Intracranial vertebral arteries,  basilar artery, posterior cerebral arteries are patent. Right posterior communicating artery is present. Possible diminutive left posterior communicating artery also noted. There is no significant stenosis or aneurysm. MRA NECK Common, internal, and external carotid arteries are patent. Codominant vertebral arteries are patent. No hemodynamically significant stenosis or evidence of dissection. IMPRESSION: No acute infarction, hemorrhage, or mass. Mild to moderate chronic microvascular ischemic changes. No large vessel occlusion or hemodynamically significant stenosis. Electronically Signed   By: Macy Mis M.D.   On: 02/11/2022 12:00   CT HEAD CODE STROKE WO CONTRAST  Result Date: 02/11/2022 CLINICAL DATA:  Code stroke.  Neuro deficit, acute, stroke suspected EXAM: CT HEAD WITHOUT CONTRAST TECHNIQUE: Contiguous axial images were obtained from the base of the skull through the vertex without intravenous contrast. RADIATION DOSE REDUCTION: This exam was performed according to the departmental dose-optimization program which includes automated exposure control, adjustment of the mA and/or kV according to patient size and/or use of iterative reconstruction technique. COMPARISON:  December 2022 FINDINGS: Brain: No acute intracranial hemorrhage, mass effect, or edema. No new loss of gray-white differentiation. Patchy hypoattenuation in the supratentorial white matter is nonspecific but may reflect similar chronic microvascular ischemic changes. Low density within the pons is likely artifactual. There  is no extra-axial collection. Vascular: No hyperdense vessel. Skull: Unremarkable. Sinuses/Orbits: Patchy paranasal sinus mucosal thickening. Bilateral lens replacements. Other: Mastoid air cells are clear. ASPECTS ([place] Stroke Program Early CT Score) - Ganglionic level infarction (caudate, lentiform nuclei, internal capsule, insula, M1-M3 cortex): 7 - Supraganglionic infarction (M4-M6 cortex): 3 Total score (0-10  with 10 being normal): 10 IMPRESSION: There is no acute intracranial hemorrhage or evidence of acute infarction. ASPECT score is 10. These results were communicated to Dr. Theda Sers at 9:43 am on 02/11/2022 by text page via the [facility] messaging system. Electronically Signed   By: Macy Mis M.D.   On: 02/11/2022 09:44      Scheduled Meds:  acetaminophen  500 mg Oral q AM   allopurinol  100 mg Oral QHS   atorvastatin  40 mg Oral Daily   carvedilol  25 mg Oral BID WC   escitalopram  5 mg Oral QHS   estradiol  1 g Vaginal Q M,W,F   pantoprazole  40 mg Oral Daily   sodium bicarbonate  650 mg Oral TID   vitamin B-12  5,000 mcg Oral q AM   Warfarin - Pharmacist Dosing Inpatient   Does not apply q1600   Continuous Infusions:   LOS: 1 day     Dessa Phi, DO [facility] 02/12/2022, 12:49 PM   Available via Epic secure chat 7am-7pm After these hours, please refer to coverage provider listed on [email or web address]

## 2022-02-13 DIAGNOSIS — R404 Transient alteration of awareness: Secondary | ICD-10-CM | POA: Diagnosis not present

## 2022-02-13 DIAGNOSIS — R531 Weakness: Secondary | ICD-10-CM | POA: Diagnosis not present

## 2022-02-13 DIAGNOSIS — Z743 Need for continuous supervision: Secondary | ICD-10-CM | POA: Diagnosis not present

## 2022-02-13 DIAGNOSIS — G459 Transient cerebral ischemic attack, unspecified: Secondary | ICD-10-CM | POA: Diagnosis not present

## 2022-02-13 DIAGNOSIS — Z7401 Bed confinement status: Secondary | ICD-10-CM | POA: Diagnosis not present

## 2022-02-13 LAB — BASIC METABOLIC PANEL
Anion gap: 11 (ref 5–15)
BUN: 41 mg/dL — ABNORMAL HIGH (ref 8–23)
CO2: 15 mmol/L — ABNORMAL LOW (ref 22–32)
Calcium: 8.2 mg/dL — ABNORMAL LOW (ref 8.9–10.3)
Chloride: 117 mmol/L — ABNORMAL HIGH (ref 98–111)
Creatinine, Ser: 1.88 mg/dL — ABNORMAL HIGH (ref 0.44–1.00)
GFR, Estimated: 27 mL/min — ABNORMAL LOW (ref >60)
Glucose, Bld: 95 mg/dL (ref 70–99)
Potassium: 4.1 mmol/L (ref 3.5–5.1)
Sodium: 143 mmol/L (ref 135–145)

## 2022-02-13 LAB — PROTIME-INR
INR: 1.4 — ABNORMAL HIGH (ref 0.8–1.2)
Prothrombin Time: 16.8 seconds — ABNORMAL HIGH (ref 11.4–15.2)

## 2022-02-13 MED ORDER — LOPERAMIDE HCL 2 MG PO CAPS
4.0000 mg | ORAL_CAPSULE | ORAL | Status: DC | PRN
  Administered 2022-02-13: 4 mg via ORAL
  Filled 2022-02-13: qty 2

## 2022-02-13 MED ORDER — WARFARIN SODIUM 6 MG PO TABS: 6.0000 mg | ORAL_TABLET | Freq: Every day | ORAL | Status: AC

## 2022-02-13 MED ORDER — WARFARIN SODIUM 6 MG PO TABS
6.0000 mg | ORAL_TABLET | Freq: Once | ORAL | Status: DC
  Filled 2022-02-13: qty 1

## 2022-02-13 NOTE — Discharge Summary (Signed)
Physician Discharge Summary  Gloria Mckay ITG:[id number] DOB: 02-15-1941 DOA: 02/11/2022  PCP: Pcp, No  Admit date: 02/11/2022 Discharge date: 02/13/2022  Admitted From: SNF Disposition:  SNF  Recommendations for Outpatient Follow-up:  Follow up with PCP in 1 week Check INR daily until therapeutic, then weekly following Follow up with neurology in 4 weeks  Discharge Condition: Stable CODE STATUS: DNR Diet recommendation: Heart healthy diet  Brief/Interim Summary: Gloria Mckay is a 81 y.o. female with medical history significant for hypertension, myelodysplastic syndrome, stage IV chronic kidney disease, anemia of chronic disease, history of DVT on coumadin who was brought into the ER from the skilled nursing facility where she resides by EMS for evaluation of slurred speech and right-sided weakness.  Patient's last known well was about 8 AM on the day of admission.  At about 830 AM, she called her daughter because her speech was slurred and she had right-sided weakness.  Her daughter was able to reach out to the nursing home staff who called EMS after they noticed the patient had right-sided weakness and a right facial droop. She was a code stroke upon arrival to the ER and had an NIH SS score of 7.  Initial CT scan of the head did not show an acute bleed.  Patient not a candidate for tPA because she is on Coumadin and not a candidate for IR because her symptoms were improving. MRI brain, MRA head and neck negative for acute infarction, hemorrhage or mass.  No large vessel occlusion or hemodynamically significant stenosis. EEG showed moderate diffuse encephalopathy, nonspecific etiology.  No seizures or epileptiform discharges seen. Neurology/Stroke team consulted during hospitalization.   Discharge Diagnoses:   Principal Problem:   TIA (transient ischemic attack) Active Problems:   History of DVT of lower extremity   Anemia of chronic disease   Hypertension   CKD (chronic kidney disease)  stage 4, GFR 15-29 ml/min (HCC)   Hyperkalemia   Normal anion gap metabolic acidosis   TIA with right-sided weakness and slurred speech -Symptoms improved -CT head negative for acute intracranial hemorrhage or acute infarction -MRI brain, MRA head and neck negative for acute infarction, hemorrhage or mass.  No large vessel occlusion or hemodynamically significant stenosis -EEG showed moderate diffuse encephalopathy, nonspecific etiology.  No seizures or epileptiform discharges seen -Echocardiogram EF 60-65 with normal function, normal diastolic parameters -PT OT SLP evaluated -Stroke team consulted -Continue Coumadin, lipitor  -Can consider switching to DOAC.  Patient preferred to discuss with her hematologist and nephrologist  Hyperkalemia -Given Lokelma.  Resolved  CKD stage IV -Baseline creatinine 2 -Stable  Hypertension -Coreg, hydralazine  History MDS -Followed by oncology at [facility]   History DVT left lower extremity Paroxysmal A Fib  -Coumadin   E Coli UTI, POA -Was diagnosed with E Coli UTI at outside facility (see under media, scanned).  Macrobid not recommended with her creatinine clearance.  Discussed with pharmacy, given 1 dose of fosfomycin 6/16   Discharge Instructions  Discharge Instructions     Ambulatory referral to Neurology   Complete by: As directed    Follow up with stroke clinic NP (Jessica Vanschaick or Cecille Rubin, if both not available, consider Zachery Dauer, or Ahern) at [facility] in about 4 weeks. Thanks.   Diet - low sodium heart healthy   Complete by: As directed    Increase activity slowly   Complete by: As directed       Allergies as of 02/13/2022       Reactions  Avocado Shortness Of Breath   Benadryl [diphenhydramine] Palpitations   Affected heart rate ER told her not to take again   Cephalosporins Anaphylaxis   Latex Anaphylaxis, Shortness Of Breath, Rash   Airway swelling   Other Rash   Fluoroquinolones - pt prefers not  to take after breaking out in a rash after taking ciprofloxacin.   Valium [diazepam] Shortness Of Breath, Other (See Comments)   Hyperventilation    Zinacef [cefuroxime] Anaphylaxis   Methylpyrrolidone Hives   Norvasc [amlodipine] Cough   Penicillins Hives, Swelling   Most mycin drugs   Quinolones Other (See Comments)   Joint pain   Roxicodone [oxycodone] Hives, Nausea Only   Stadol [butorphanol] Hives   Sulfa Antibiotics Hives, Swelling   Tomato Other (See Comments)   Mouth sores   Voltaren [diclofenac] Hives   Pt able to use diclofenac gel 1%   Zofran [ondansetron] Hives, Other (See Comments)   Sedation   Altace [ramipril] Cough   Iodine I 131 Tositumomab Other (See Comments)   Listed on MAR as allergy Unknown reaction   Lodine [etodolac] Other (See Comments)   Listed on MAR as allergy Unknown reaction   Nexium [esomeprazole Magnesium] Diarrhea, Nausea And Vomiting   Protonix [pantoprazole] Diarrhea   Zestril [lisinopril] Cough   Ace Inhibitors Other (See Comments), Cough   Lisinopril and ramipril   Cipro [ciprofloxacin Hcl] Rash   Codeine Nausea Only        Medication List     STOP taking these medications    meloxicam 7.5 MG tablet Commonly known as: MOBIC   naloxone 4 MG/0.1ML Liqd nasal spray kit Commonly known as: NARCAN   traMADol-acetaminophen 37.5-325 MG tablet Commonly known as: ULTRACET       TAKE these medications    acetaminophen 500 MG tablet Commonly known as: TYLENOL Take 500 mg by mouth in the morning.   allopurinol 100 MG tablet Commonly known as: ZYLOPRIM Take 100 mg by mouth at bedtime.   AMBULATORY NON FORMULARY MEDICATION Medication Name: rollator walker with seat per patient preference and insurance coverage   amLODipine 10 MG tablet Commonly known as: NORVASC Take 1 tablet (10 mg total) by mouth daily. What changed: when to take this   Calcium 600+D 600-10 MG-MCG Tabs Generic drug: Calcium Carb-Cholecalciferol Take 1  tablet by mouth daily.   carvedilol 25 MG tablet Commonly known as: COREG Take 1 tablet (25 mg total) by mouth 2 (two) times daily with a meal.   Cyanocobalamin 5000 MCG Caps Take 5,000 mcg by mouth in the morning.   diclofenac Sodium 1 % Gel Commonly known as: VOLTAREN Apply 4 g topically every 12 (twelve) hours.   escitalopram 5 MG tablet Commonly known as: LEXAPRO Take 5 mg by mouth at bedtime.   esomeprazole 40 MG capsule Commonly known as: NEXIUM Take 1 capsule (40 mg total) by mouth 2 (two) times daily before a meal.   estradiol 0.1 MG/GM vaginal cream Commonly known as: ESTRACE Place 1 g vaginally every Monday, Wednesday, and Friday.   FiberChoice 2 g Chew Generic drug: Inulin Chew 5 g by mouth daily. Listed on MAR as fiber gummies 2g (inulin) - 2 gummies for daily (for 5g of fiber per serving of 2)   Glycerin-Hypromellose-PEG 400 0.2-0.2-1 % Soln Place 2 drops into both eyes 2 (two) times daily.   hydrALAZINE 25 MG tablet Commonly known as: APRESOLINE Take 25 mg by mouth 3 (three) times daily.   loperamide 2 MG capsule Commonly known  as: IMODIUM Take 4 mg by mouth 3 (three) times daily before meals.   melatonin 3 MG Tabs tablet Take 3 mg by mouth at bedtime.   pregabalin 25 MG capsule Commonly known as: LYRICA Take 25 mg by mouth 2 (two) times daily.   sodium bicarbonate 650 MG tablet Take 650 mg by mouth 3 (three) times daily.   tiZANidine 2 MG tablet Commonly known as: ZANAFLEX Take 2 mg by mouth at bedtime.   topiramate 100 MG tablet Commonly known as: TOPAMAX Take 1.5 tabs (150 mg) in AM and 2 tabs (200 mg) in PM What changed:  how much to take how to take this when to take this additional instructions   warfarin 6 MG tablet Commonly known as: COUMADIN Take as directed. If you are unsure how to take this medication, talk to your nurse or doctor. Original instructions: Take 1 tablet (6 mg total) by mouth daily at 4 PM. Repeat INR daily  until therapeutic What changed:  medication strength how much to take how to take this when to take this additional instructions Another medication with the same name was removed. Continue taking this medication, and follow the directions you see here.        Contact information for follow-up providers     [facility]. Schedule an appointment as soon as possible for a visit in 1 month(s).   Specialty: Neurology Why: stroke clinic Contact information: [place] [phone]             Contact information for after-discharge care     Destination     [facility] .   Service: Skilled Nursing Contact information: [address] [place] [phone]                    Allergies  Allergen Reactions   Avocado Shortness Of Breath   Benadryl [Diphenhydramine] Palpitations    Affected heart rate ER told her not to take again   Cephalosporins Anaphylaxis   Latex Anaphylaxis, Shortness Of Breath and Rash    Airway swelling   Other Rash    Fluoroquinolones - pt prefers not to take after breaking out in a rash after taking ciprofloxacin.   Valium [Diazepam] Shortness Of Breath and Other (See Comments)    Hyperventilation    Zinacef [Cefuroxime] Anaphylaxis   Methylpyrrolidone Hives   Norvasc [Amlodipine] Cough   Penicillins Hives and Swelling    Most mycin drugs   Quinolones Other (See Comments)    Joint pain   Roxicodone [Oxycodone] Hives and Nausea Only   Stadol [Butorphanol] Hives   Sulfa Antibiotics Hives and Swelling   Tomato Other (See Comments)    Mouth sores   Voltaren [Diclofenac] Hives    Pt able to use diclofenac gel 1%   Zofran [Ondansetron] Hives and Other (See Comments)    Sedation   Altace [Ramipril] Cough   Iodine I 131 Tositumomab Other (See Comments)    Listed on MAR as allergy Unknown reaction   Lodine [Etodolac] Other (See  Comments)    Listed on MAR as allergy Unknown reaction   Nexium [Esomeprazole Magnesium] Diarrhea and Nausea And Vomiting   Protonix [Pantoprazole] Diarrhea   Zestril [Lisinopril] Cough   Ace Inhibitors Other (See Comments) and Cough    Lisinopril and ramipril    Cipro [Ciprofloxacin Hcl] Rash   Codeine Nausea Only    Consultations: Neurology  Procedures/Studies: ECHOCARDIOGRAM COMPLETE  Result Date: 02/12/2022    ECHOCARDIOGRAM REPORT   Patient Name:   Gloria Mckay Date of Exam: 02/12/2022 Medical Rec #:  [id number]  Height:       63.0 in Accession #:    [id number] Weight:       122.6 lb Date of Birth:  1940/11/28  BSA:          1.570 m Patient Age:    78 years   BP:           156/72 mmHg Patient Gender: F          HR:           66 bpm. Exam Location:  Inpatient Procedure: 2D Echo, 3D Echo, Cardiac Doppler and Color Doppler Indications:    Stroke. I63.9  History:        Patient has prior history of Echocardiogram examinations, most                 recent 12/03/2019. Abnormal ECG, TIA, Arrythmias:ATACH;                 Signs/Symptoms:Chest Pain.  Sonographer:    Roseanna Rainbow RDCS Referring Phys: [id number] [place]  1. Left ventricular ejection fraction, by estimation, is 60 to 65%. The left ventricle has normal function. The left ventricle has no regional wall motion abnormalities. Left ventricular diastolic parameters were normal.  2. Right ventricular systolic function is moderately reduced. The right ventricular size is moderately enlarged. There is moderately elevated pulmonary artery systolic pressure.  3. The mitral valve is normal in structure. Mild mitral valve regurgitation. No evidence of mitral stenosis.  4. Calcified leaflet tips and chordal structures in RVOT. The tricuspid valve is abnormal. Tricuspid valve regurgitation is severe.  5. The aortic valve is tricuspid. There is mild calcification of the aortic valve. There is mild thickening of the aortic valve. Aortic  valve regurgitation is not visualized. Aortic valve sclerosis is present, with no evidence of aortic valve stenosis.  6. The inferior vena cava is normal in size with greater than 50% respiratory variability, suggesting right atrial pressure of 3 mmHg. FINDINGS  Left Ventricle: Left ventricular ejection fraction, by estimation, is 60 to 65%. The left ventricle has normal function. The left ventricle has no regional wall motion abnormalities. The left ventricular internal cavity size was normal in size. There is  no left ventricular hypertrophy. Left ventricular diastolic parameters were normal. Right Ventricle: The right ventricular size is moderately enlarged. No increase in right ventricular wall thickness. Right ventricular systolic function is moderately reduced. There is moderately elevated pulmonary artery systolic pressure. The tricuspid  regurgitant velocity is 3.11 m/s, and with an assumed right atrial pressure of 15 mmHg, the estimated right ventricular systolic pressure is 46.2 mmHg. Left Atrium: Left atrial size was normal in size. Right Atrium: Right atrial size was normal in size. Pericardium: There is no evidence of pericardial effusion. Mitral Valve: The mitral valve is normal in structure. Mild mitral valve regurgitation. No evidence of mitral valve stenosis. Tricuspid Valve: Calcified leaflet tips and chordal structures in RVOT. The tricuspid valve is abnormal. Tricuspid valve regurgitation is severe. No evidence of tricuspid stenosis. Aortic Valve: The aortic valve is tricuspid. There is mild calcification of the aortic valve. There is mild thickening of the aortic valve. Aortic valve regurgitation is not visualized. Aortic valve sclerosis is present, with no evidence of aortic valve stenosis. Pulmonic Valve: The pulmonic valve was  normal in structure. Pulmonic valve regurgitation is trivial. No evidence of pulmonic stenosis. Aorta: The aortic root is normal in size and structure. Venous: The  inferior vena cava is normal in size with greater than 50% respiratory variability, suggesting right atrial pressure of 3 mmHg. IAS/Shunts: No atrial level shunt detected by color flow Doppler.  LEFT VENTRICLE PLAX 2D LVIDd:         3.80 cm     Diastology LVIDs:         2.30 cm     LV e' medial:    5.82 cm/s LV PW:         0.95 cm     LV E/e' medial:  11.2 LV IVS:        1.05 cm     LV e' lateral:   8.64 cm/s LVOT diam:     1.80 cm     LV E/e' lateral: 7.5 LV SV:         59 LV SV Index:   38 LVOT Area:     2.54 cm                             3D Volume EF: LV Volumes (MOD)           3D EF:        59 % LV vol d, MOD A2C: 47.2 ml LV EDV:       78 ml LV vol d, MOD A4C: 44.9 ml LV ESV:       32 ml LV vol s, MOD A2C: 14.0 ml LV SV:        47 ml LV vol s, MOD A4C: 14.0 ml LV SV MOD A2C:     33.2 ml LV SV MOD A4C:     44.9 ml LV SV MOD BP:      31.9 ml RIGHT VENTRICLE            IVC RV S prime:     9.11 cm/s  IVC diam: 1.80 cm TAPSE (M-mode): 2.1 cm LEFT ATRIUM             Index        RIGHT ATRIUM           Index LA diam:        2.80 cm 1.78 cm/m   RA Area:     13.60 cm LA Vol (A2C):   14.4 ml 9.17 ml/m   RA Volume:   31.50 ml  20.06 ml/m LA Vol (A4C):   28.6 ml 18.21 ml/m LA Biplane Vol: 19.9 ml 12.67 ml/m  AORTIC VALVE LVOT Vmax:   125.00 cm/s LVOT Vmean:  78.600 cm/s LVOT VTI:    0.233 m  AORTA Ao Root diam: 3.10 cm Ao Asc diam:  3.20 cm MITRAL VALVE               TRICUSPID VALVE MV Area (PHT): 3.31 cm    TR Peak grad:   38.7 mmHg MV Decel Time: 229 msec    TR Vmax:        311.00 cm/s MV E velocity: 64.96 cm/s MV A velocity: 79.00 cm/s  SHUNTS MV E/A ratio:  0.82        Systemic VTI:  0.23 m                            Systemic Diam: 1.80  cm Jenkins Rouge MD Electronically signed by Jenkins Rouge MD Signature Date/Time: 02/12/2022/2:28:17 PM    Final    EEG adult  Result Date: 02/12/2022 Lora Havens, MD     02/12/2022  8:18 AM Patient Name: Gloria Mckay MRN: [id number] Epilepsy Attending: Lora Havens  Referring Physician/Provider: Katy Apo, NP Date: 01/11/2022 Duration: 25.24 mins Patient history: 81 year old patient with history of DVT on warfarin, atrial fibrillation, TIA, seizures and myelodysplastic syndrome presents with sudden onset right sided weakness, right facial droop and dysarthria.  EEG to evaluate for seizure. Level of alertness: Awake AEDs during EEG study: None Technical aspects: This EEG study was done with scalp electrodes positioned according to the 10-20 International system of electrode placement. Electrical activity was acquired at a sampling rate of '500Hz'  and reviewed with a high frequency filter of '70Hz'  and a low frequency filter of '1Hz' . EEG data were recorded continuously and digitally stored. Description: The posterior dominant rhythm consists of 6.5 Hz activity of moderate voltage (25-35 uV) seen predominantly in posterior head regions, symmetric and reactive to eye opening and eye closing. EEG showed continuous generalized 5 to 7 Hz theta slowing. Hyperventilation and photic stimulation were not performed.   ABNORMALITY - Continuous slow, generalized IMPRESSION: This study is suggestive of moderate diffuse encephalopathy, nonspecific etiology. No seizures or epileptiform discharges were seen throughout the recording. Lora Havens   MR BRAIN WO CONTRAST  Result Date: 02/11/2022 CLINICAL DATA:  Neuro deficit, acute, stroke suspected EXAM: MRI HEAD WITHOUT CONTRAST MRA HEAD WITHOUT CONTRAST MRA NECK WITHOUT CONTRAST TECHNIQUE: Multiplanar, multiecho pulse sequences of the brain and surrounding structures were obtained without intravenous contrast. Angiographic images of the Circle of Willis were obtained using MRA technique without intravenous contrast. Angiographic images of the neck were obtained using MRA technique without intravenous contrast. Carotid stenosis measurements (when applicable) are obtained utilizing NASCET criteria, using the distal internal carotid  diameter as the denominator. COMPARISON:  None Available. FINDINGS: MRI HEAD Brain: There is no acute infarction or intracranial hemorrhage. There is no intracranial mass, mass effect, or edema. There is no hydrocephalus or extra-axial fluid collection. Patchy and confluent areas of T2 hyperintensity in the supratentorial white matter are nonspecific may reflect mild to moderate chronic microvascular ischemic changes. A punctate focus of susceptibility in the dorsal pons likely reflects chronic microhemorrhage. Prominence of the ventricles and sulci reflects minor parenchymal volume loss. Vascular: Major vessel flow voids at the skull base are preserved. Skull and upper cervical spine: Normal marrow signal is preserved. Sinuses/Orbits: Minor mucosal thickening. Bilateral lens replacements. Other: Sella is unremarkable.  Mastoid air cells are clear. MRA HEAD Intracranial internal carotid arteries are patent. Middle and anterior cerebral arteries are patent. Intracranial vertebral arteries, basilar artery, posterior cerebral arteries are patent. Right posterior communicating artery is present. Possible diminutive left posterior communicating artery also noted. There is no significant stenosis or aneurysm. MRA NECK Common, internal, and external carotid arteries are patent. Codominant vertebral arteries are patent. No hemodynamically significant stenosis or evidence of dissection. IMPRESSION: No acute infarction, hemorrhage, or mass. Mild to moderate chronic microvascular ischemic changes. No large vessel occlusion or hemodynamically significant stenosis. Electronically Signed   By: Macy Mis M.D.   On: 02/11/2022 12:00   MR ANGIO HEAD WO CONTRAST  Result Date: 02/11/2022 CLINICAL DATA:  Neuro deficit, acute, stroke suspected EXAM: MRI HEAD WITHOUT CONTRAST MRA HEAD WITHOUT CONTRAST MRA NECK WITHOUT CONTRAST TECHNIQUE: Multiplanar, multiecho pulse sequences of the brain  and surrounding structures were obtained  without intravenous contrast. Angiographic images of the Circle of Willis were obtained using MRA technique without intravenous contrast. Angiographic images of the neck were obtained using MRA technique without intravenous contrast. Carotid stenosis measurements (when applicable) are obtained utilizing NASCET criteria, using the distal internal carotid diameter as the denominator. COMPARISON:  None Available. FINDINGS: MRI HEAD Brain: There is no acute infarction or intracranial hemorrhage. There is no intracranial mass, mass effect, or edema. There is no hydrocephalus or extra-axial fluid collection. Patchy and confluent areas of T2 hyperintensity in the supratentorial white matter are nonspecific may reflect mild to moderate chronic microvascular ischemic changes. A punctate focus of susceptibility in the dorsal pons likely reflects chronic microhemorrhage. Prominence of the ventricles and sulci reflects minor parenchymal volume loss. Vascular: Major vessel flow voids at the skull base are preserved. Skull and upper cervical spine: Normal marrow signal is preserved. Sinuses/Orbits: Minor mucosal thickening. Bilateral lens replacements. Other: Sella is unremarkable.  Mastoid air cells are clear. MRA HEAD Intracranial internal carotid arteries are patent. Middle and anterior cerebral arteries are patent. Intracranial vertebral arteries, basilar artery, posterior cerebral arteries are patent. Right posterior communicating artery is present. Possible diminutive left posterior communicating artery also noted. There is no significant stenosis or aneurysm. MRA NECK Common, internal, and external carotid arteries are patent. Codominant vertebral arteries are patent. No hemodynamically significant stenosis or evidence of dissection. IMPRESSION: No acute infarction, hemorrhage, or mass. Mild to moderate chronic microvascular ischemic changes. No large vessel occlusion or hemodynamically significant stenosis. Electronically  Signed   By: Macy Mis M.D.   On: 02/11/2022 12:00   MR ANGIO NECK WO CONTRAST  Result Date: 02/11/2022 CLINICAL DATA:  Neuro deficit, acute, stroke suspected EXAM: MRI HEAD WITHOUT CONTRAST MRA HEAD WITHOUT CONTRAST MRA NECK WITHOUT CONTRAST TECHNIQUE: Multiplanar, multiecho pulse sequences of the brain and surrounding structures were obtained without intravenous contrast. Angiographic images of the Circle of Willis were obtained using MRA technique without intravenous contrast. Angiographic images of the neck were obtained using MRA technique without intravenous contrast. Carotid stenosis measurements (when applicable) are obtained utilizing NASCET criteria, using the distal internal carotid diameter as the denominator. COMPARISON:  None Available. FINDINGS: MRI HEAD Brain: There is no acute infarction or intracranial hemorrhage. There is no intracranial mass, mass effect, or edema. There is no hydrocephalus or extra-axial fluid collection. Patchy and confluent areas of T2 hyperintensity in the supratentorial white matter are nonspecific may reflect mild to moderate chronic microvascular ischemic changes. A punctate focus of susceptibility in the dorsal pons likely reflects chronic microhemorrhage. Prominence of the ventricles and sulci reflects minor parenchymal volume loss. Vascular: Major vessel flow voids at the skull base are preserved. Skull and upper cervical spine: Normal marrow signal is preserved. Sinuses/Orbits: Minor mucosal thickening. Bilateral lens replacements. Other: Sella is unremarkable.  Mastoid air cells are clear. MRA HEAD Intracranial internal carotid arteries are patent. Middle and anterior cerebral arteries are patent. Intracranial vertebral arteries, basilar artery, posterior cerebral arteries are patent. Right posterior communicating artery is present. Possible diminutive left posterior communicating artery also noted. There is no significant stenosis or aneurysm. MRA NECK  Common, internal, and external carotid arteries are patent. Codominant vertebral arteries are patent. No hemodynamically significant stenosis or evidence of dissection. IMPRESSION: No acute infarction, hemorrhage, or mass. Mild to moderate chronic microvascular ischemic changes. No large vessel occlusion or hemodynamically significant stenosis. Electronically Signed   By: Macy Mis M.D.   On: 02/11/2022 12:00  CT HEAD CODE STROKE WO CONTRAST  Result Date: 02/11/2022 CLINICAL DATA:  Code stroke.  Neuro deficit, acute, stroke suspected EXAM: CT HEAD WITHOUT CONTRAST TECHNIQUE: Contiguous axial images were obtained from the base of the skull through the vertex without intravenous contrast. RADIATION DOSE REDUCTION: This exam was performed according to the departmental dose-optimization program which includes automated exposure control, adjustment of the mA and/or kV according to patient size and/or use of iterative reconstruction technique. COMPARISON:  December 2022 FINDINGS: Brain: No acute intracranial hemorrhage, mass effect, or edema. No new loss of gray-white differentiation. Patchy hypoattenuation in the supratentorial white matter is nonspecific but may reflect similar chronic microvascular ischemic changes. Low density within the pons is likely artifactual. There is no extra-axial collection. Vascular: No hyperdense vessel. Skull: Unremarkable. Sinuses/Orbits: Patchy paranasal sinus mucosal thickening. Bilateral lens replacements. Other: Mastoid air cells are clear. ASPECTS ([place] Stroke Program Early CT Score) - Ganglionic level infarction (caudate, lentiform nuclei, internal capsule, insula, M1-M3 cortex): 7 - Supraganglionic infarction (M4-M6 cortex): 3 Total score (0-10 with 10 being normal): 10 IMPRESSION: There is no acute intracranial hemorrhage or evidence of acute infarction. ASPECT score is 10. These results were communicated to Dr. Theda Sers at 9:43 am on 02/11/2022 by text page via the  [facility] messaging system. Electronically Signed   By: Macy Mis M.D.   On: 02/11/2022 09:44       Discharge Exam: Vitals:   02/13/22 0400 02/13/22 0845  BP: (!) 164/86 (!) 157/71  Pulse: 67 64  Resp: 18 16  Temp: 98.5 F (36.9 C) 98.7 F (37.1 C)  SpO2: 99% 100%    General: Pt is alert, awake, not in acute distress Cardiovascular: RRR, S1/S2 +, no edema Respiratory: CTA bilaterally, no wheezing, no rhonchi, no respiratory distress, no conversational dyspnea  Abdominal: Soft, NT, ND, bowel sounds + Extremities: no edema, no cyanosis Psych: Normal mood and affect, stable judgement and insight     The results of significant diagnostics from this hospitalization (including imaging, microbiology, ancillary and laboratory) are listed below for reference.     Microbiology: No results found for this or any previous visit (from the past 240 hour(s)).   Labs: BNP (last 3 results) No results for input(s): "BNP" in the last 8760 hours. Basic Metabolic Panel: Recent Labs  Lab 02/11/22 1000 02/11/22 1426 02/11/22 2006 02/12/22 0137 02/12/22 0547 02/12/22 0816 02/13/22 0254  NA 142  --   --   --   --  145 143  K 5.9*   < > 5.4* 5.3* 5.0 5.3* 4.1  CL 120*  --   --   --   --  117* 117*  CO2 17*  --   --   --   --  16* 15*  GLUCOSE 91  --   --   --   --  97 95  BUN 40*  --   --   --   --  36* 41*  CREATININE 1.95*  --   --   --   --  1.91* 1.88*  CALCIUM 8.2*  --   --   --   --  8.5* 8.2*   < > = values in this interval not displayed.   Liver Function Tests: Recent Labs  Lab 02/11/22 1000  AST 24  ALT 23  ALKPHOS 121  BILITOT 0.5  PROT 6.4*  ALBUMIN 3.0*   No results for input(s): "LIPASE", "AMYLASE" in the last 168 hours. No results for input(s): "AMMONIA" in  the last 168 hours. CBC: Recent Labs  Lab 02/11/22 1000 02/12/22 0547  WBC 4.4 5.1  NEUTROABS 1.8  --   HGB 8.7* 9.3*  HCT 29.3* 29.7*  MCV 105.0* 100.0  PLT 192 185   Cardiac Enzymes: No results  for input(s): "CKTOTAL", "CKMB", "CKMBINDEX", "TROPONINI" in the last 168 hours. BNP: Invalid input(s): "POCBNP" CBG: Recent Labs  Lab 02/11/22 0921 02/11/22 1523 02/11/22 1744 02/11/22 1810  GLUCAP 77 87 79 102*   D-Dimer No results for input(s): "DDIMER" in the last 72 hours. Hgb A1c Recent Labs    02/12/22 0137  HGBA1C 5.0   Lipid Profile Recent Labs    02/12/22 0137  CHOL 126  HDL 48  LDLCALC 60  TRIG 88  CHOLHDL 2.6   Thyroid function studies No results for input(s): "TSH", "T4TOTAL", "T3FREE", "THYROIDAB" in the last 72 hours.  Invalid input(s): "FREET3" Anemia work up No results for input(s): "VITAMINB12", "FOLATE", "FERRITIN", "TIBC", "IRON", "RETICCTPCT" in the last 72 hours. Urinalysis    Component Value Date/Time   COLORURINE YELLOW 09/25/2018 1422   APPEARANCEUR CLEAR 09/25/2018 1422   LABSPEC 1.015 09/25/2018 1422   PHURINE 5.5 09/25/2018 1422   GLUCOSEU NEGATIVE 09/25/2018 1422   HGBUR NEGATIVE 09/25/2018 1422   BILIRUBINUR negative 02/20/2021 1525   BILIRUBINUR negative 02/23/2018 1459   KETONESUR negative 02/20/2021 1525   KETONESUR NEGATIVE 09/25/2018 1422   PROTEINUR NEGATIVE 09/25/2018 1422   UROBILINOGEN 0.2 02/20/2021 1525   NITRITE Negative 02/20/2021 1525   NITRITE NEGATIVE 09/25/2018 1422   LEUKOCYTESUR Negative 02/20/2021 1525   Sepsis Labs Recent Labs  Lab 02/11/22 1000 02/12/22 0547  WBC 4.4 5.1   Microbiology No results found for this or any previous visit (from the past 240 hour(s)).   Patient was seen and examined on the day of discharge and was found to be in stable condition. Time coordinating discharge: 35 minutes including assessment and coordination of care, as well as examination of the patient.   SIGNED:  Dessa Phi, DO [facility] 02/13/2022, 10:07 AM

## 2022-02-13 NOTE — Progress Notes (Signed)
ANTICOAGULATION CONSULT NOTE - Follow Up Consult  Pharmacy Consult for Warfarin Indication: atrial fibrillation and hx DVT  Allergies  Allergen Reactions   Avocado Shortness Of Breath   Benadryl [Diphenhydramine] Palpitations    Affected heart rate ER told her not to take again   Cephalosporins Anaphylaxis   Latex Anaphylaxis, Shortness Of Breath and Rash    Airway swelling   Other Rash    Fluoroquinolones - pt prefers not to take after breaking out in a rash after taking ciprofloxacin.   Valium [Diazepam] Shortness Of Breath and Other (See Comments)    Hyperventilation    Zinacef [Cefuroxime] Anaphylaxis   Methylpyrrolidone Hives   Norvasc [Amlodipine] Cough   Penicillins Hives and Swelling    Most mycin drugs   Quinolones Other (See Comments)    Joint pain   Roxicodone [Oxycodone] Hives and Nausea Only   Stadol [Butorphanol] Hives   Sulfa Antibiotics Hives and Swelling   Tomato Other (See Comments)    Mouth sores   Voltaren [Diclofenac] Hives    Pt able to use diclofenac gel 1%   Zofran [Ondansetron] Hives and Other (See Comments)    Sedation   Altace [Ramipril] Cough   Iodine I 131 Tositumomab Other (See Comments)    Listed on MAR as allergy Unknown reaction   Lodine [Etodolac] Other (See Comments)    Listed on MAR as allergy Unknown reaction   Nexium [Esomeprazole Magnesium] Diarrhea and Nausea And Vomiting   Protonix [Pantoprazole] Diarrhea   Zestril [Lisinopril] Cough   Ace Inhibitors Other (See Comments) and Cough    Lisinopril and ramipril    Cipro [Ciprofloxacin Hcl] Rash   Codeine Nausea Only    Patient Measurements: Height: '5\' 3"'$  (160 cm) Weight: 55.6 kg (122 lb 9.2 oz) IBW/kg (Calculated) : 52.4  Vital Signs: Temp: 98.5 F (36.9 C) (06/17 0400) Temp Source: Oral (06/17 0400) BP: 164/86 (06/17 0400) Pulse Rate: 67 (06/17 0400)  Labs: Recent Labs    02/11/22 1000 02/12/22 0547 02/12/22 0816 02/13/22 0254  HGB 8.7* 9.3*  --   --   HCT  29.3* 29.7*  --   --   PLT 192 185  --   --   APTT 36  --   --   --   LABPROT 18.6* 17.2*  --  16.8*  INR 1.6* 1.4*  --  1.4*  CREATININE 1.95*  --  1.91* 1.88*     Estimated Creatinine Clearance: 20.1 mL/min (A) (by C-G formula based on SCr of 1.88 mg/dL (H)).  Assessment: Gloria Mckay a 81 y.o. female presented with stroke like symptoms on warfarin PTA for h/o DVT and atrial fibrillation.Pharmacy has been consulted for warfarin dosing.   INR subtherapeutic (1.6) on admit 6/15 and remains subtherapeutic (1.4) after Warfarin 5 mg x 1 given on 6/15 and Warfarin '6mg'$  on 6/16.  CBC stable and no s/sx of bleeding noted  PTA warfarin: 4.5 mg daily. Per med history, recently decreased from 5.5 mg daily.  Goal of Therapy:  INR 2-3 Monitor platelets by anticoagulation protocol: Yes   Plan:  Warfarin 6 mg x 1 today Daily PT/INR Monitor for signs/symptoms of bleeding  Lestine Box, PharmD PGY2 Infectious Diseases Pharmacy Resident   Please check [email or web address] for unit-specific pharmacy phone numbers

## 2022-02-13 NOTE — TOC Transition Note (Signed)
Transition of Care [facility]) - CM/SW Discharge Note   Patient Details  Name: Gloria Mckay MRN: [id number] Date of Birth: 09-Jun-1941  Transition of Care [facility]) CM/SW Contact:  Amador Cunas, [place] Phone Number: 02/13/2022, 11:01 AM   Clinical Narrative: Pt for dc back to [name] today where she is a LTC resident. Spoke to Soy at [facility] who confirmed they are prepared to admit pt back to room 807B. Packet complete and RN has called report. Pt's dtr Yolanda aware of dc and reports agreeable. SW signing off at dc.   Wandra Feinstein, MSW, LCSW [phone] (coverage)        Final next level of care: [place] Barriers to Discharge: No Barriers Identified   Patient Goals and CMS Choice Patient states their goals for this hospitalization and ongoing recovery are:: to get back to [facility] CMS Medicare.gov Compare Post Acute Care list provided to:: Patient Choice offered to / list presented to : Patient  Discharge Placement              Patient chooses bed at: [name] Patient to be transferred to facility by: [place] Name of family member notified: Yolanda/dtr Patient and family notified of of transfer: 02/13/22  Discharge Plan and Services     Post Acute Care Choice: [place]                               Social Determinants of Health (SDOH) Interventions     Readmission Risk Interventions     No data to display

## 2022-02-14 DIAGNOSIS — I82409 Acute embolism and thrombosis of unspecified deep veins of unspecified lower extremity: Secondary | ICD-10-CM | POA: Diagnosis not present

## 2022-02-14 DIAGNOSIS — I4891 Unspecified atrial fibrillation: Secondary | ICD-10-CM | POA: Diagnosis not present

## 2022-02-14 DIAGNOSIS — G8929 Other chronic pain: Secondary | ICD-10-CM | POA: Diagnosis not present

## 2022-02-15 DIAGNOSIS — R52 Pain, unspecified: Secondary | ICD-10-CM | POA: Diagnosis not present

## 2022-02-15 DIAGNOSIS — R2681 Unsteadiness on feet: Secondary | ICD-10-CM | POA: Diagnosis not present

## 2022-02-15 DIAGNOSIS — N184 Chronic kidney disease, stage 4 (severe): Secondary | ICD-10-CM | POA: Diagnosis not present

## 2022-02-15 DIAGNOSIS — I129 Hypertensive chronic kidney disease with stage 1 through stage 4 chronic kidney disease, or unspecified chronic kidney disease: Secondary | ICD-10-CM | POA: Diagnosis not present

## 2022-02-15 DIAGNOSIS — R531 Weakness: Secondary | ICD-10-CM | POA: Diagnosis not present

## 2022-02-15 DIAGNOSIS — M6281 Muscle weakness (generalized): Secondary | ICD-10-CM | POA: Diagnosis not present

## 2022-02-16 DIAGNOSIS — R52 Pain, unspecified: Secondary | ICD-10-CM | POA: Diagnosis not present

## 2022-02-16 DIAGNOSIS — M6281 Muscle weakness (generalized): Secondary | ICD-10-CM | POA: Diagnosis not present

## 2022-02-16 DIAGNOSIS — N184 Chronic kidney disease, stage 4 (severe): Secondary | ICD-10-CM | POA: Diagnosis not present

## 2022-02-16 DIAGNOSIS — R531 Weakness: Secondary | ICD-10-CM | POA: Diagnosis not present

## 2022-02-16 DIAGNOSIS — I129 Hypertensive chronic kidney disease with stage 1 through stage 4 chronic kidney disease, or unspecified chronic kidney disease: Secondary | ICD-10-CM | POA: Diagnosis not present

## 2022-02-16 DIAGNOSIS — R2681 Unsteadiness on feet: Secondary | ICD-10-CM | POA: Diagnosis not present

## 2022-02-17 DIAGNOSIS — N189 Chronic kidney disease, unspecified: Secondary | ICD-10-CM | POA: Diagnosis not present

## 2022-02-17 DIAGNOSIS — R52 Pain, unspecified: Secondary | ICD-10-CM | POA: Diagnosis not present

## 2022-02-17 DIAGNOSIS — D649 Anemia, unspecified: Secondary | ICD-10-CM | POA: Diagnosis not present

## 2022-02-17 DIAGNOSIS — M6281 Muscle weakness (generalized): Secondary | ICD-10-CM | POA: Diagnosis not present

## 2022-02-17 DIAGNOSIS — I10 Essential (primary) hypertension: Secondary | ICD-10-CM | POA: Diagnosis not present

## 2022-02-17 DIAGNOSIS — N184 Chronic kidney disease, stage 4 (severe): Secondary | ICD-10-CM | POA: Diagnosis not present

## 2022-02-17 DIAGNOSIS — R2681 Unsteadiness on feet: Secondary | ICD-10-CM | POA: Diagnosis not present

## 2022-02-17 DIAGNOSIS — G459 Transient cerebral ischemic attack, unspecified: Secondary | ICD-10-CM | POA: Diagnosis not present

## 2022-02-17 DIAGNOSIS — R531 Weakness: Secondary | ICD-10-CM | POA: Diagnosis not present

## 2022-02-17 DIAGNOSIS — I82409 Acute embolism and thrombosis of unspecified deep veins of unspecified lower extremity: Secondary | ICD-10-CM | POA: Diagnosis not present

## 2022-02-17 DIAGNOSIS — I129 Hypertensive chronic kidney disease with stage 1 through stage 4 chronic kidney disease, or unspecified chronic kidney disease: Secondary | ICD-10-CM | POA: Diagnosis not present

## 2022-02-17 DIAGNOSIS — I4891 Unspecified atrial fibrillation: Secondary | ICD-10-CM | POA: Diagnosis not present

## 2022-02-18 DIAGNOSIS — M6281 Muscle weakness (generalized): Secondary | ICD-10-CM | POA: Diagnosis not present

## 2022-02-18 DIAGNOSIS — R531 Weakness: Secondary | ICD-10-CM | POA: Diagnosis not present

## 2022-02-18 DIAGNOSIS — N189 Chronic kidney disease, unspecified: Secondary | ICD-10-CM | POA: Diagnosis not present

## 2022-02-18 DIAGNOSIS — R2681 Unsteadiness on feet: Secondary | ICD-10-CM | POA: Diagnosis not present

## 2022-02-18 DIAGNOSIS — G459 Transient cerebral ischemic attack, unspecified: Secondary | ICD-10-CM | POA: Diagnosis not present

## 2022-02-18 DIAGNOSIS — N184 Chronic kidney disease, stage 4 (severe): Secondary | ICD-10-CM | POA: Diagnosis not present

## 2022-02-18 DIAGNOSIS — R52 Pain, unspecified: Secondary | ICD-10-CM | POA: Diagnosis not present

## 2022-02-18 DIAGNOSIS — I129 Hypertensive chronic kidney disease with stage 1 through stage 4 chronic kidney disease, or unspecified chronic kidney disease: Secondary | ICD-10-CM | POA: Diagnosis not present

## 2022-02-18 DIAGNOSIS — N179 Acute kidney failure, unspecified: Secondary | ICD-10-CM | POA: Diagnosis not present

## 2022-02-18 DIAGNOSIS — N390 Urinary tract infection, site not specified: Secondary | ICD-10-CM | POA: Diagnosis not present

## 2022-02-18 DIAGNOSIS — I82409 Acute embolism and thrombosis of unspecified deep veins of unspecified lower extremity: Secondary | ICD-10-CM | POA: Diagnosis not present

## 2022-02-19 DIAGNOSIS — R531 Weakness: Secondary | ICD-10-CM | POA: Diagnosis not present

## 2022-02-19 DIAGNOSIS — N189 Chronic kidney disease, unspecified: Secondary | ICD-10-CM | POA: Diagnosis not present

## 2022-02-19 DIAGNOSIS — N184 Chronic kidney disease, stage 4 (severe): Secondary | ICD-10-CM | POA: Diagnosis not present

## 2022-02-19 DIAGNOSIS — I129 Hypertensive chronic kidney disease with stage 1 through stage 4 chronic kidney disease, or unspecified chronic kidney disease: Secondary | ICD-10-CM | POA: Diagnosis not present

## 2022-02-19 DIAGNOSIS — R52 Pain, unspecified: Secondary | ICD-10-CM | POA: Diagnosis not present

## 2022-02-19 DIAGNOSIS — R2681 Unsteadiness on feet: Secondary | ICD-10-CM | POA: Diagnosis not present

## 2022-02-19 DIAGNOSIS — M6281 Muscle weakness (generalized): Secondary | ICD-10-CM | POA: Diagnosis not present

## 2022-02-20 DIAGNOSIS — M25562 Pain in left knee: Secondary | ICD-10-CM | POA: Diagnosis not present

## 2022-02-21 DIAGNOSIS — M25469 Effusion, unspecified knee: Secondary | ICD-10-CM | POA: Diagnosis not present

## 2022-02-22 DIAGNOSIS — R531 Weakness: Secondary | ICD-10-CM | POA: Diagnosis not present

## 2022-02-22 DIAGNOSIS — R2681 Unsteadiness on feet: Secondary | ICD-10-CM | POA: Diagnosis not present

## 2022-02-22 DIAGNOSIS — N184 Chronic kidney disease, stage 4 (severe): Secondary | ICD-10-CM | POA: Diagnosis not present

## 2022-02-22 DIAGNOSIS — I129 Hypertensive chronic kidney disease with stage 1 through stage 4 chronic kidney disease, or unspecified chronic kidney disease: Secondary | ICD-10-CM | POA: Diagnosis not present

## 2022-02-22 DIAGNOSIS — R52 Pain, unspecified: Secondary | ICD-10-CM | POA: Diagnosis not present

## 2022-02-22 DIAGNOSIS — M6281 Muscle weakness (generalized): Secondary | ICD-10-CM | POA: Diagnosis not present

## 2022-02-23 DIAGNOSIS — R52 Pain, unspecified: Secondary | ICD-10-CM | POA: Diagnosis not present

## 2022-02-23 DIAGNOSIS — R531 Weakness: Secondary | ICD-10-CM | POA: Diagnosis not present

## 2022-02-23 DIAGNOSIS — N184 Chronic kidney disease, stage 4 (severe): Secondary | ICD-10-CM | POA: Diagnosis not present

## 2022-02-23 DIAGNOSIS — I129 Hypertensive chronic kidney disease with stage 1 through stage 4 chronic kidney disease, or unspecified chronic kidney disease: Secondary | ICD-10-CM | POA: Diagnosis not present

## 2022-02-23 DIAGNOSIS — M6281 Muscle weakness (generalized): Secondary | ICD-10-CM | POA: Diagnosis not present

## 2022-02-23 DIAGNOSIS — R2681 Unsteadiness on feet: Secondary | ICD-10-CM | POA: Diagnosis not present

## 2022-02-24 DIAGNOSIS — R531 Weakness: Secondary | ICD-10-CM | POA: Diagnosis not present

## 2022-02-24 DIAGNOSIS — R52 Pain, unspecified: Secondary | ICD-10-CM | POA: Diagnosis not present

## 2022-02-24 DIAGNOSIS — N184 Chronic kidney disease, stage 4 (severe): Secondary | ICD-10-CM | POA: Diagnosis not present

## 2022-02-24 DIAGNOSIS — I129 Hypertensive chronic kidney disease with stage 1 through stage 4 chronic kidney disease, or unspecified chronic kidney disease: Secondary | ICD-10-CM | POA: Diagnosis not present

## 2022-02-24 DIAGNOSIS — R2681 Unsteadiness on feet: Secondary | ICD-10-CM | POA: Diagnosis not present

## 2022-02-24 DIAGNOSIS — M6281 Muscle weakness (generalized): Secondary | ICD-10-CM | POA: Diagnosis not present

## 2022-02-25 DIAGNOSIS — R531 Weakness: Secondary | ICD-10-CM | POA: Diagnosis not present

## 2022-02-25 DIAGNOSIS — N184 Chronic kidney disease, stage 4 (severe): Secondary | ICD-10-CM | POA: Diagnosis not present

## 2022-02-25 DIAGNOSIS — R52 Pain, unspecified: Secondary | ICD-10-CM | POA: Diagnosis not present

## 2022-02-25 DIAGNOSIS — M6281 Muscle weakness (generalized): Secondary | ICD-10-CM | POA: Diagnosis not present

## 2022-02-25 DIAGNOSIS — I129 Hypertensive chronic kidney disease with stage 1 through stage 4 chronic kidney disease, or unspecified chronic kidney disease: Secondary | ICD-10-CM | POA: Diagnosis not present

## 2022-02-25 DIAGNOSIS — R2681 Unsteadiness on feet: Secondary | ICD-10-CM | POA: Diagnosis not present

## 2022-02-26 DIAGNOSIS — I4891 Unspecified atrial fibrillation: Secondary | ICD-10-CM | POA: Diagnosis not present

## 2022-02-26 DIAGNOSIS — I10 Essential (primary) hypertension: Secondary | ICD-10-CM | POA: Diagnosis not present

## 2022-02-26 DIAGNOSIS — N189 Chronic kidney disease, unspecified: Secondary | ICD-10-CM | POA: Diagnosis not present

## 2022-02-26 DIAGNOSIS — G459 Transient cerebral ischemic attack, unspecified: Secondary | ICD-10-CM | POA: Diagnosis not present

## 2022-03-01 DIAGNOSIS — N390 Urinary tract infection, site not specified: Secondary | ICD-10-CM | POA: Diagnosis not present

## 2022-03-02 DIAGNOSIS — N390 Urinary tract infection, site not specified: Secondary | ICD-10-CM | POA: Diagnosis not present

## 2022-03-09 DIAGNOSIS — B351 Tinea unguium: Secondary | ICD-10-CM | POA: Diagnosis not present

## 2022-03-09 DIAGNOSIS — E1159 Type 2 diabetes mellitus with other circulatory complications: Secondary | ICD-10-CM | POA: Diagnosis not present

## 2022-03-11 DIAGNOSIS — I82409 Acute embolism and thrombosis of unspecified deep veins of unspecified lower extremity: Secondary | ICD-10-CM | POA: Diagnosis not present

## 2022-03-11 DIAGNOSIS — D472 Monoclonal gammopathy: Secondary | ICD-10-CM | POA: Diagnosis not present

## 2022-03-11 DIAGNOSIS — N189 Chronic kidney disease, unspecified: Secondary | ICD-10-CM | POA: Diagnosis not present

## 2022-03-11 DIAGNOSIS — G459 Transient cerebral ischemic attack, unspecified: Secondary | ICD-10-CM | POA: Diagnosis not present

## 2022-03-11 DIAGNOSIS — M25462 Effusion, left knee: Secondary | ICD-10-CM | POA: Diagnosis not present

## 2022-03-11 DIAGNOSIS — M25562 Pain in left knee: Secondary | ICD-10-CM | POA: Diagnosis not present

## 2022-03-11 DIAGNOSIS — N184 Chronic kidney disease, stage 4 (severe): Secondary | ICD-10-CM | POA: Diagnosis not present

## 2022-03-11 DIAGNOSIS — R600 Localized edema: Secondary | ICD-10-CM | POA: Diagnosis not present

## 2022-03-11 DIAGNOSIS — I4891 Unspecified atrial fibrillation: Secondary | ICD-10-CM | POA: Diagnosis not present

## 2022-03-11 DIAGNOSIS — I10 Essential (primary) hypertension: Secondary | ICD-10-CM | POA: Diagnosis not present

## 2022-03-11 DIAGNOSIS — D638 Anemia in other chronic diseases classified elsewhere: Secondary | ICD-10-CM | POA: Diagnosis not present

## 2022-03-12 DIAGNOSIS — Z79899 Other long term (current) drug therapy: Secondary | ICD-10-CM | POA: Diagnosis not present

## 2022-03-15 DIAGNOSIS — B957 Other staphylococcus as the cause of diseases classified elsewhere: Secondary | ICD-10-CM | POA: Diagnosis not present

## 2022-03-15 DIAGNOSIS — R791 Abnormal coagulation profile: Secondary | ICD-10-CM | POA: Diagnosis not present

## 2022-03-15 DIAGNOSIS — I82592 Chronic embolism and thrombosis of other specified deep vein of left lower extremity: Secondary | ICD-10-CM | POA: Diagnosis not present

## 2022-03-15 DIAGNOSIS — B950 Streptococcus, group A, as the cause of diseases classified elsewhere: Secondary | ICD-10-CM | POA: Diagnosis not present

## 2022-03-15 DIAGNOSIS — Z7901 Long term (current) use of anticoagulants: Secondary | ICD-10-CM | POA: Diagnosis not present

## 2022-03-15 DIAGNOSIS — T8459XD Infection and inflammatory reaction due to other internal joint prosthesis, subsequent encounter: Secondary | ICD-10-CM | POA: Diagnosis not present

## 2022-03-15 DIAGNOSIS — M7989 Other specified soft tissue disorders: Secondary | ICD-10-CM | POA: Diagnosis not present

## 2022-03-15 DIAGNOSIS — R197 Diarrhea, unspecified: Secondary | ICD-10-CM | POA: Diagnosis not present

## 2022-03-15 DIAGNOSIS — Z96659 Presence of unspecified artificial knee joint: Secondary | ICD-10-CM | POA: Diagnosis not present

## 2022-03-15 DIAGNOSIS — N1832 Chronic kidney disease, stage 3b: Secondary | ICD-10-CM | POA: Diagnosis not present

## 2022-03-15 DIAGNOSIS — Z881 Allergy status to other antibiotic agents status: Secondary | ICD-10-CM | POA: Diagnosis not present

## 2022-03-15 DIAGNOSIS — D803 Selective deficiency of immunoglobulin G [IgG] subclasses: Secondary | ICD-10-CM | POA: Diagnosis not present

## 2022-03-15 DIAGNOSIS — I10 Essential (primary) hypertension: Secondary | ICD-10-CM | POA: Diagnosis not present

## 2022-03-17 DIAGNOSIS — I82409 Acute embolism and thrombosis of unspecified deep veins of unspecified lower extremity: Secondary | ICD-10-CM | POA: Diagnosis not present

## 2022-03-17 DIAGNOSIS — G459 Transient cerebral ischemic attack, unspecified: Secondary | ICD-10-CM | POA: Diagnosis not present

## 2022-03-17 DIAGNOSIS — T8459XA Infection and inflammatory reaction due to other internal joint prosthesis, initial encounter: Secondary | ICD-10-CM | POA: Diagnosis not present

## 2022-03-17 DIAGNOSIS — M25469 Effusion, unspecified knee: Secondary | ICD-10-CM | POA: Diagnosis not present

## 2022-03-17 DIAGNOSIS — I10 Essential (primary) hypertension: Secondary | ICD-10-CM | POA: Diagnosis not present

## 2022-03-17 DIAGNOSIS — I4891 Unspecified atrial fibrillation: Secondary | ICD-10-CM | POA: Diagnosis not present

## 2022-03-18 ENCOUNTER — Inpatient Hospital Stay: Payer: Medicare Other | Admitting: Family Medicine

## 2022-03-20 DIAGNOSIS — M25469 Effusion, unspecified knee: Secondary | ICD-10-CM | POA: Diagnosis not present

## 2022-03-20 DIAGNOSIS — M25562 Pain in left knee: Secondary | ICD-10-CM | POA: Diagnosis not present

## 2022-03-20 DIAGNOSIS — I82409 Acute embolism and thrombosis of unspecified deep veins of unspecified lower extremity: Secondary | ICD-10-CM | POA: Diagnosis not present

## 2022-03-20 DIAGNOSIS — N189 Chronic kidney disease, unspecified: Secondary | ICD-10-CM | POA: Diagnosis not present

## 2022-03-20 DIAGNOSIS — R791 Abnormal coagulation profile: Secondary | ICD-10-CM | POA: Diagnosis not present

## 2022-04-02 DIAGNOSIS — T8484XD Pain due to internal orthopedic prosthetic devices, implants and grafts, subsequent encounter: Secondary | ICD-10-CM | POA: Diagnosis not present

## 2022-04-02 DIAGNOSIS — Z96652 Presence of left artificial knee joint: Secondary | ICD-10-CM | POA: Diagnosis not present

## 2022-04-02 DIAGNOSIS — T8454XD Infection and inflammatory reaction due to internal left knee prosthesis, subsequent encounter: Secondary | ICD-10-CM | POA: Diagnosis not present

## 2022-04-05 DIAGNOSIS — N1832 Chronic kidney disease, stage 3b: Secondary | ICD-10-CM | POA: Diagnosis not present

## 2022-04-05 DIAGNOSIS — I10 Essential (primary) hypertension: Secondary | ICD-10-CM | POA: Diagnosis not present

## 2022-04-05 DIAGNOSIS — G459 Transient cerebral ischemic attack, unspecified: Secondary | ICD-10-CM | POA: Diagnosis not present

## 2022-04-05 DIAGNOSIS — D803 Selective deficiency of immunoglobulin G [IgG] subclasses: Secondary | ICD-10-CM | POA: Diagnosis not present

## 2022-04-05 DIAGNOSIS — T8459XD Infection and inflammatory reaction due to other internal joint prosthesis, subsequent encounter: Secondary | ICD-10-CM | POA: Diagnosis not present

## 2022-04-05 DIAGNOSIS — Z96659 Presence of unspecified artificial knee joint: Secondary | ICD-10-CM | POA: Diagnosis not present

## 2022-04-05 DIAGNOSIS — B950 Streptococcus, group A, as the cause of diseases classified elsewhere: Secondary | ICD-10-CM | POA: Diagnosis not present

## 2022-04-05 DIAGNOSIS — I4891 Unspecified atrial fibrillation: Secondary | ICD-10-CM | POA: Diagnosis not present

## 2022-04-05 DIAGNOSIS — B957 Other staphylococcus as the cause of diseases classified elsewhere: Secondary | ICD-10-CM | POA: Diagnosis not present

## 2022-04-05 DIAGNOSIS — Z881 Allergy status to other antibiotic agents status: Secondary | ICD-10-CM | POA: Diagnosis not present

## 2022-04-05 DIAGNOSIS — N189 Chronic kidney disease, unspecified: Secondary | ICD-10-CM | POA: Diagnosis not present

## 2022-04-05 DIAGNOSIS — I82592 Chronic embolism and thrombosis of other specified deep vein of left lower extremity: Secondary | ICD-10-CM | POA: Diagnosis not present

## 2022-04-09 DIAGNOSIS — N3592 Unspecified urethral stricture, female: Secondary | ICD-10-CM | POA: Diagnosis not present

## 2022-04-10 DIAGNOSIS — I129 Hypertensive chronic kidney disease with stage 1 through stage 4 chronic kidney disease, or unspecified chronic kidney disease: Secondary | ICD-10-CM | POA: Diagnosis not present

## 2022-04-12 DIAGNOSIS — I4891 Unspecified atrial fibrillation: Secondary | ICD-10-CM | POA: Diagnosis not present

## 2022-04-12 NOTE — Progress Notes (Unsigned)
[facility] [address] [place]. [place] [address] [phone]       [place] Elinor Kleine Date of Birth:  28-Oct-1940 Medical Record Number:  [id number]   Reason for Referral:  hospital stroke follow up    SUBJECTIVE:   CHIEF COMPLAINT:  No chief complaint on file.   HPI:   Ms. Gloria Mckay is a 81y.o. female with history of hypertension, MDS, CKD 4, DVT/PE on Coumadin who presented on 02/11/2022 with transient right-sided weakness, slurred speech, right facial droop. No tPA given due to symptom resolved.  Personally reviewed hospitalization pertinent progress notes, lab work and imaging.  Evaluated by Dr. Erlinda Hong for left brain TIA as work-up unremarkable for acute stroke.  EEG no seizure activity with moderate diffuse encephalopathy.  EF 60 to 65%.  LDL 60.  A1c 5.0.  On warfarin daily PTA with fluctuation of INRs with INR 1.6 on admission, discussed transitioning to [place] but patient wished to further discuss with her hematologist and nephrologist prior to transition.  Recommended INR goal 2-3.  No statin recommended given advanced age and LDL at goal.  No prior stroke history.  She was discharged back to SNF on 6/15.        PERTINENT IMAGING  Per hospitalization 02/11/2022 - *** CT no acute abnormality MRI no acute infarct MRA head and neck unremarkable EEG no seizure, moderate diffuse encephalopathy 2D Echo EF 60 to 65% LDL 60 HgbA1c 5.0        ROS:   14 system review of systems performed and negative with exception of ***  PMH:  Past Medical History:  Diagnosis Date   Arthritis    Cancer ([place])    Cataract    bilaterally   Hypertension    Myelodysplasia (myelodysplastic syndrome) ([place])    Myelodysplastic syndrome (H[facility])     PSH:  Past Surgical History:  Procedure Laterality Date   JOINT REPLACEMENT     STOMACH SURGERY      Social History:  Social History   Socioeconomic History   Marital status: Widowed     Spouse name: Not on file   Number of children: 4   Years of education: 84   Highest education level: 12th grade  Occupational History   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    Social Determinants of Health   Financial Resource Strain: Low Risk  (07/14/2020)   Overall Financial Resource Strain (CARDIA)    Difficulty of Paying Living Expenses: Not hard at all  Food Insecurity: No Food Insecurity (07/14/2020)   Hunger Vital Sign    Worried About Running Out of Food in the Last Year: Never true    Ran Out of Food in the Last Year: Never true  Transportation Needs: No Transportation Needs (07/14/2020)   PRAPARE - Hydrologist (Medical): No    Lack of Transportation (Non-Medical): No  Physical Activity: Inactive (07/14/2020)   Exercise Vital Sign    Days of Exercise per Week: 0 days    Minutes of Exercise per Session: 0  min  Stress: Stress Concern Present (07/14/2020)   [place]    Feeling of Stress : To some extent  Social Connections: Socially Isolated (07/14/2020)   Social Connection and Isolation Panel [NHANES]    Frequency of Communication with Friends and Family: More than three times a week    Frequency of Social Gatherings with Friends and Family: More than three times a week    Attends Religious Services: Never    Marine scientist or Organizations: No    Attends Archivist Meetings: Never    Marital Status: Widowed  Intimate Partner Violence: Not At Risk (07/14/2020)   Humiliation, Afraid, Rape, and  Kick questionnaire    Fear of Current or Ex-Partner: No    Emotionally Abused: No    Physically Abused: No    Sexually Abused: No    Family History:  Family History  Problem Relation Age of Onset   Hypertension Mother    Pulmonary embolism Mother    Heart disease Father     Medications:   Current Outpatient Medications on File Prior to Visit  Medication Sig Dispense Refill   acetaminophen (TYLENOL) 500 MG tablet Take 500 mg by mouth in the morning.     allopurinol (ZYLOPRIM) 100 MG tablet Take 100 mg by mouth at bedtime.     AMBULATORY NON FORMULARY MEDICATION Medication Name: rollator walker with seat per patient preference and insurance coverage 1 Units prn   amLODipine (NORVASC) 10 MG tablet Take 1 tablet (10 mg total) by mouth daily. (Patient taking differently: Take 10 mg by mouth in the morning.) 90 tablet 3   Calcium Carb-Cholecalciferol (CALCIUM 600+D) 600-10 MG-MCG TABS Take 1 tablet by mouth daily.     carvedilol (COREG) 25 MG tablet Take 1 tablet (25 mg total) by mouth 2 (two) times daily with a meal. 60 tablet 2   Cyanocobalamin 5000 MCG CAPS Take 5,000 mcg by mouth in the morning.     diclofenac Sodium (VOLTAREN) 1 % GEL Apply 4 g topically every 12 (twelve) hours.     escitalopram (LEXAPRO) 5 MG tablet Take 5 mg by mouth at bedtime.     esomeprazole (NEXIUM) 40 MG capsule Take 1 capsule (40 mg total) by mouth 2 (two) times daily before a meal. (Patient not taking: Reported on 02/11/2022) 60 capsule 3   estradiol (ESTRACE) 0.1 MG/GM vaginal cream Place 1 g vaginally every Monday, Wednesday, and Friday.     Glycerin-Hypromellose-PEG 400 0.2-0.2-1 % SOLN Place 2 drops into both eyes 2 (two) times daily.     hydrALAZINE (APRESOLINE) 25 MG tablet Take 25 mg by mouth 3 (three) times daily.     Inulin (FIBERCHOICE) 2 g CHEW Chew 5 g by mouth daily. Listed on MAR as fiber gummies 2g (inulin) - 2 gummies for daily (for 5g of fiber per serving of 2)     loperamide (IMODIUM) 2 MG  capsule Take 4 mg by mouth 3 (three) times daily before meals.     melatonin 3 MG TABS tablet Take 3 mg by mouth at bedtime.     pregabalin (LYRICA) 25 MG capsule Take 25 mg by mouth 2 (two) times daily.     sodium bicarbonate 650 MG tablet Take 650 mg by mouth 3 (three) times daily.     tiZANidine (ZANAFLEX) 2 MG tablet Take 2 mg by mouth at bedtime.     topiramate (TOPAMAX) 100 MG tablet Take 1.5 tabs (150  mg) in AM and 2 tabs (200 mg) in PM (Patient taking differently: Take 100 mg by mouth 2 (two) times daily.) 90 tablet 1   warfarin (COUMADIN) 6 MG tablet Take 1 tablet (6 mg total) by mouth daily at 4 PM. Repeat INR daily until therapeutic     No current facility-administered medications on file prior to visit.    Allergies:   Allergies  Allergen Reactions   Avocado Shortness Of Breath   Benadryl [Diphenhydramine] Palpitations    Affected heart rate ER told her not to take again   Cephalosporins Anaphylaxis   Latex Anaphylaxis, Shortness Of Breath and Rash    Airway swelling   Other Rash    Fluoroquinolones - pt prefers not to take after breaking out in a rash after taking ciprofloxacin.   Valium [Diazepam] Shortness Of Breath and Other (See Comments)    Hyperventilation    Zinacef [Cefuroxime] Anaphylaxis   Methylpyrrolidone Hives   Norvasc [Amlodipine] Cough   Penicillins Hives and Swelling    Most mycin drugs   Quinolones Other (See Comments)    Joint pain   Roxicodone [Oxycodone] Hives and Nausea Only   Stadol [Butorphanol] Hives   Sulfa Antibiotics Hives and Swelling   Tomato Other (See Comments)    Mouth sores   Voltaren [Diclofenac] Hives    Pt able to use diclofenac gel 1%   Zofran [Ondansetron] Hives and Other (See Comments)    Sedation   Altace [Ramipril] Cough   Iodine I 131 Tositumomab Other (See Comments)    Listed on MAR as allergy Unknown reaction   Lodine [Etodolac] Other (See Comments)    Listed on MAR as allergy Unknown reaction   Nexium  [Esomeprazole Magnesium] Diarrhea and Nausea And Vomiting   Protonix [Pantoprazole] Diarrhea   Zestril [Lisinopril] Cough   Ace Inhibitors Other (See Comments) and Cough    Lisinopril and ramipril    Cipro [Ciprofloxacin Hcl] Rash   Codeine Nausea Only      OBJECTIVE:  Physical Exam  There were no vitals filed for this visit. There is no height or weight on file to calculate BMI. No results found.     07/14/2020    5:24 PM  Depression screen PHQ 2/9  Decreased Interest 1  Down, Depressed, Hopeless 0  PHQ - 2 Score 1     General: well developed, well nourished, seated, in no evident distress Head: head normocephalic and atraumatic.   Neck: supple with no carotid or supraclavicular bruits Cardiovascular: regular rate and rhythm, no murmurs Musculoskeletal: no deformity Skin:  no rash/petichiae Vascular:  Normal pulses all extremities   Neurologic Exam Mental Status: Awake and fully alert. Oriented to place and time. Recent and remote memory intact. Attention span, concentration and fund of knowledge appropriate. Mood and affect appropriate.  Cranial Nerves: Fundoscopic exam reveals sharp disc margins. Pupils equal, briskly reactive to light. Extraocular movements full without nystagmus. Visual fields full to confrontation. Hearing intact. Facial sensation intact. Face, tongue, palate moves normally and symmetrically.  Motor: Normal bulk and tone. Normal strength in all tested extremity muscles Sensory.: intact to touch , pinprick , position and vibratory sensation.  Coordination: Rapid alternating movements normal in all extremities. Finger-to-nose and heel-to-shin performed accurately bilaterally. Gait and Station: Arises from chair without difficulty. Stance is normal. Gait demonstrates normal stride length and balance with ***. Tandem walk and heel toe ***.  Reflexes: 1+ and symmetric. Toes downgoing.     NIHSS  *** Modified [place]  ***  ASSESSMENT: Gloria Mckay is a 81 y.o. year old female with left brain TIA on 02/11/2022. Vascular risk factors include hx of DVT/PE on chronic Coumadin, HTN and advanced age.      PLAN:  TIA:  Continue warfarin daily for hx of PE/DVT with INR goal 2-3 and for secondary stroke prevention.   Discussed secondary stroke prevention measures and importance of close PCP follow up for aggressive stroke risk factor management including BP goal<130/90, HLD with LDL goal<70 and DM with A1c.<7 .  Stroke labs 01/2022: LDL 60, A1c 5.0 I have gone over the pathophysiology of stroke, warning signs and symptoms, risk factors and their management in some detail with instructions to go to the closest emergency room for symptoms of concern.     Follow up in *** or call earlier if needed   CC:  [facility] provider: Dr. Leonie Man PCP: Pcp, No    I spent *** minutes of face-to-face and non-face-to-face time with patient.  This included previsit chart review including review of recent hospitalization, lab review, study review, order entry, electronic health record documentation, patient education regarding recent stroke including etiology, secondary stroke prevention measures and importance of managing stroke risk factors, residual deficits and typical recovery time and answered all other questions to patient satisfaction   Frann Rider, AGNP-BC  [facility] [address] [place], [place] [address]  Phone [phone] Fax [phone] Note: This document was prepared with digital dictation and possible smart phrase technology. Any transcriptional errors that result from this process are unintentional.

## 2022-04-14 ENCOUNTER — Encounter: Payer: Self-pay | Admitting: Adult Health

## 2022-04-14 ENCOUNTER — Ambulatory Visit (INDEPENDENT_AMBULATORY_CARE_PROVIDER_SITE_OTHER): Payer: Medicare Other | Admitting: Adult Health

## 2022-04-14 VITALS — BP 96/57 | HR 61 | Ht 62.0 in

## 2022-04-14 DIAGNOSIS — Z09 Encounter for follow-up examination after completed treatment for conditions other than malignant neoplasm: Secondary | ICD-10-CM

## 2022-04-14 DIAGNOSIS — G459 Transient cerebral ischemic attack, unspecified: Secondary | ICD-10-CM | POA: Diagnosis not present

## 2022-04-14 DIAGNOSIS — C4490 Unspecified malignant neoplasm of skin, unspecified: Secondary | ICD-10-CM | POA: Diagnosis not present

## 2022-04-14 DIAGNOSIS — R251 Tremor, unspecified: Secondary | ICD-10-CM | POA: Diagnosis not present

## 2022-04-14 NOTE — Patient Instructions (Addendum)
Overall stable from a stroke standpoint and can follow up as needed at this time    Would recommend conservative measures for your tremors at this time - can try weighted wrist weights, weighted pens and utensils. Try to limit excessive amount of caffeine and try to manage stress as this can worsen tremors. We will check thyroid levels today to ensure your thyroid is functioning properly.   Continue warfarin daily  and atorvastatin  for secondary stroke prevention  Blood pressure slightly on low side this morning, please have this rechecked at [name] for ongoing monitoring   Continue to follow up with PCP regarding cholesterol and blood pressure management  Maintain strict control of hypertension with blood pressure goal below 130/90 and cholesterol with LDL cholesterol (bad cholesterol) goal below 70 mg/dL.   Signs of a Stroke? Follow the BEFAST method:  Balance Watch for a sudden loss of balance, trouble with coordination or vertigo Eyes Is there a sudden loss of vision in one or both eyes? Or double vision?  Face: Ask the person to smile. Does one side of the face droop or is it numb?  Arms: Ask the person to raise both arms. Does one arm drift downward? Is there weakness or numbness of a leg? Speech: Ask the person to repeat a simple phrase. Does the speech sound slurred/strange? Is the person confused ? Time: If you observe any of these signs, call 911.        Thank you for coming to see [place] at [facility]. I hope we have been able to provide you high quality care today.  You may receive a patient satisfaction survey over the next few weeks. We would appreciate your feedback and comments so that we may continue to improve ourselves and the health of our patients.     Tremor A tremor is trembling or shaking that you cannot control. Most tremors affect the hands or arms. Tremors can also affect the head, vocal cords, face, and other parts of the body.  There are many types of tremors. Common types include: Essential tremor. These usually occur in people older than 40. This type of tremor may run in families and can happen in otherwise healthy people. Resting tremor. These occur when the muscles are at rest, such as when your hands are resting in your lap. People with Parkinson's disease often have resting tremors. Postural tremor. These occur when you try to hold a pose, such as keeping your hands outstretched. Kinetic tremor. These occur during purposeful movement, such as trying to touch a finger to your nose. Task-specific tremor. These may occur when you do certain tasks such as writing, speaking, or standing. Psychogenic tremor. These are greatly reduced or go away when you are distracted. These tremors happen due to underlying stress or psychiatric disease. They can happen in people of all ages. Some types of tremors have no known cause. Tremors can also be a symptom of nervous system problems (neurological disorders) that may occur with aging. Some tremors go away with treatment, while others do not. Follow these instructions at home: Lifestyle     If you drink alcohol: Limit how much you have to: 0-1 drink a day for women who are not pregnant. 0-2 drinks a day for men. Know how much alcohol is in a drink. In the [place] one drink equals one 12 oz bottle of beer (355 mL), one 5 oz glass of wine (148 mL), or one 1 oz glass of hard  liquor (44 mL). Do not use any products that contain nicotine or tobacco. These products include cigarettes, chewing tobacco, and vaping devices, such as e-cigarettes. If you need help quitting, ask your health care provider. Avoid extreme heat and extreme cold. Limit your caffeine intake, as told by your health care provider. Try to get 8 hours of sleep each night. Find ways to manage your stress, such as meditation or yoga. General instructions Take over-the-counter and prescription medicines only as told  by your health care provider. Keep all follow-up visits. This is important. Contact a health care provider if: You develop a tremor after starting a new medicine. You have a tremor along with other symptoms such as: Numbness. Tingling. Pain. Weakness. Your tremor gets worse. Your tremor interferes with your day-to-day life. Summary A tremor is trembling or shaking that you cannot control. Most tremors affect the hands or arms. Some types of tremors have no known cause. Others may be a symptom of nervous system problems (neurological disorders). Make sure you discuss any tremors you have with your health care provider. This information is not intended to replace advice given to you by your health care provider. Make sure you discuss any questions you have with your health care provider. Document Revised: 06/05/2021 Document Reviewed: 06/05/2021 Elsevier Patient Education  [place].

## 2022-04-15 LAB — THYROID PANEL WITH TSH
Free Thyroxine Index: 1.2 (ref 1.2–4.9)
T3 Uptake Ratio: 24 % (ref 24–39)
T4, Total: 5.1 ug/dL (ref 4.5–12.0)
TSH: 3.48 u[IU]/mL (ref 0.450–4.500)

## 2022-04-20 DIAGNOSIS — H524 Presbyopia: Secondary | ICD-10-CM | POA: Diagnosis not present

## 2022-04-20 DIAGNOSIS — Z961 Presence of intraocular lens: Secondary | ICD-10-CM | POA: Diagnosis not present

## 2022-04-20 DIAGNOSIS — Z7901 Long term (current) use of anticoagulants: Secondary | ICD-10-CM | POA: Diagnosis not present

## 2022-04-20 DIAGNOSIS — H353132 Nonexudative age-related macular degeneration, bilateral, intermediate dry stage: Secondary | ICD-10-CM | POA: Diagnosis not present

## 2022-04-22 DIAGNOSIS — M6281 Muscle weakness (generalized): Secondary | ICD-10-CM | POA: Diagnosis not present

## 2022-04-22 DIAGNOSIS — Z8673 Personal history of transient ischemic attack (TIA), and cerebral infarction without residual deficits: Secondary | ICD-10-CM | POA: Diagnosis not present

## 2022-04-22 DIAGNOSIS — E039 Hypothyroidism, unspecified: Secondary | ICD-10-CM | POA: Diagnosis not present

## 2022-04-22 DIAGNOSIS — R296 Repeated falls: Secondary | ICD-10-CM | POA: Diagnosis not present

## 2022-04-22 DIAGNOSIS — R531 Weakness: Secondary | ICD-10-CM | POA: Diagnosis not present

## 2022-04-22 DIAGNOSIS — M47816 Spondylosis without myelopathy or radiculopathy, lumbar region: Secondary | ICD-10-CM | POA: Diagnosis not present

## 2022-04-22 DIAGNOSIS — G300 Alzheimer's disease with early onset: Secondary | ICD-10-CM | POA: Diagnosis not present

## 2022-04-22 DIAGNOSIS — I129 Hypertensive chronic kidney disease with stage 1 through stage 4 chronic kidney disease, or unspecified chronic kidney disease: Secondary | ICD-10-CM | POA: Diagnosis not present

## 2022-04-22 DIAGNOSIS — H5213 Myopia, bilateral: Secondary | ICD-10-CM | POA: Diagnosis not present

## 2022-04-22 DIAGNOSIS — R2681 Unsteadiness on feet: Secondary | ICD-10-CM | POA: Diagnosis not present

## 2022-04-22 DIAGNOSIS — H524 Presbyopia: Secondary | ICD-10-CM | POA: Diagnosis not present

## 2022-04-22 DIAGNOSIS — D649 Anemia, unspecified: Secondary | ICD-10-CM | POA: Diagnosis not present

## 2022-04-22 DIAGNOSIS — G894 Chronic pain syndrome: Secondary | ICD-10-CM | POA: Diagnosis not present

## 2022-04-22 DIAGNOSIS — D519 Vitamin B12 deficiency anemia, unspecified: Secondary | ICD-10-CM | POA: Diagnosis not present

## 2022-04-22 DIAGNOSIS — E119 Type 2 diabetes mellitus without complications: Secondary | ICD-10-CM | POA: Diagnosis not present

## 2022-04-23 DIAGNOSIS — R296 Repeated falls: Secondary | ICD-10-CM | POA: Diagnosis not present

## 2022-04-23 DIAGNOSIS — R531 Weakness: Secondary | ICD-10-CM | POA: Diagnosis not present

## 2022-04-23 DIAGNOSIS — I10 Essential (primary) hypertension: Secondary | ICD-10-CM | POA: Diagnosis not present

## 2022-04-23 DIAGNOSIS — D649 Anemia, unspecified: Secondary | ICD-10-CM | POA: Diagnosis not present

## 2022-04-23 DIAGNOSIS — M6281 Muscle weakness (generalized): Secondary | ICD-10-CM | POA: Diagnosis not present

## 2022-04-23 DIAGNOSIS — G894 Chronic pain syndrome: Secondary | ICD-10-CM | POA: Diagnosis not present

## 2022-04-23 DIAGNOSIS — D519 Vitamin B12 deficiency anemia, unspecified: Secondary | ICD-10-CM | POA: Diagnosis not present

## 2022-04-23 DIAGNOSIS — N189 Chronic kidney disease, unspecified: Secondary | ICD-10-CM | POA: Diagnosis not present

## 2022-04-23 DIAGNOSIS — I82409 Acute embolism and thrombosis of unspecified deep veins of unspecified lower extremity: Secondary | ICD-10-CM | POA: Diagnosis not present

## 2022-04-23 DIAGNOSIS — E119 Type 2 diabetes mellitus without complications: Secondary | ICD-10-CM | POA: Diagnosis not present

## 2022-04-23 DIAGNOSIS — M47816 Spondylosis without myelopathy or radiculopathy, lumbar region: Secondary | ICD-10-CM | POA: Diagnosis not present

## 2022-04-23 DIAGNOSIS — M549 Dorsalgia, unspecified: Secondary | ICD-10-CM | POA: Diagnosis not present

## 2022-04-23 DIAGNOSIS — Z8673 Personal history of transient ischemic attack (TIA), and cerebral infarction without residual deficits: Secondary | ICD-10-CM | POA: Diagnosis not present

## 2022-04-23 DIAGNOSIS — I4891 Unspecified atrial fibrillation: Secondary | ICD-10-CM | POA: Diagnosis not present

## 2022-04-23 DIAGNOSIS — E039 Hypothyroidism, unspecified: Secondary | ICD-10-CM | POA: Diagnosis not present

## 2022-04-23 DIAGNOSIS — T8459XA Infection and inflammatory reaction due to other internal joint prosthesis, initial encounter: Secondary | ICD-10-CM | POA: Diagnosis not present

## 2022-04-23 DIAGNOSIS — G300 Alzheimer's disease with early onset: Secondary | ICD-10-CM | POA: Diagnosis not present

## 2022-04-23 DIAGNOSIS — I129 Hypertensive chronic kidney disease with stage 1 through stage 4 chronic kidney disease, or unspecified chronic kidney disease: Secondary | ICD-10-CM | POA: Diagnosis not present

## 2022-04-23 DIAGNOSIS — R2681 Unsteadiness on feet: Secondary | ICD-10-CM | POA: Diagnosis not present

## 2022-04-27 DIAGNOSIS — D649 Anemia, unspecified: Secondary | ICD-10-CM | POA: Diagnosis not present

## 2022-04-27 DIAGNOSIS — G894 Chronic pain syndrome: Secondary | ICD-10-CM | POA: Diagnosis not present

## 2022-04-27 DIAGNOSIS — G300 Alzheimer's disease with early onset: Secondary | ICD-10-CM | POA: Diagnosis not present

## 2022-04-27 DIAGNOSIS — R531 Weakness: Secondary | ICD-10-CM | POA: Diagnosis not present

## 2022-04-27 DIAGNOSIS — R296 Repeated falls: Secondary | ICD-10-CM | POA: Diagnosis not present

## 2022-04-27 DIAGNOSIS — D519 Vitamin B12 deficiency anemia, unspecified: Secondary | ICD-10-CM | POA: Diagnosis not present

## 2022-04-27 DIAGNOSIS — Z8673 Personal history of transient ischemic attack (TIA), and cerebral infarction without residual deficits: Secondary | ICD-10-CM | POA: Diagnosis not present

## 2022-04-27 DIAGNOSIS — R2681 Unsteadiness on feet: Secondary | ICD-10-CM | POA: Diagnosis not present

## 2022-04-27 DIAGNOSIS — M47816 Spondylosis without myelopathy or radiculopathy, lumbar region: Secondary | ICD-10-CM | POA: Diagnosis not present

## 2022-04-27 DIAGNOSIS — E039 Hypothyroidism, unspecified: Secondary | ICD-10-CM | POA: Diagnosis not present

## 2022-04-27 DIAGNOSIS — I129 Hypertensive chronic kidney disease with stage 1 through stage 4 chronic kidney disease, or unspecified chronic kidney disease: Secondary | ICD-10-CM | POA: Diagnosis not present

## 2022-04-27 DIAGNOSIS — E119 Type 2 diabetes mellitus without complications: Secondary | ICD-10-CM | POA: Diagnosis not present

## 2022-04-27 DIAGNOSIS — M6281 Muscle weakness (generalized): Secondary | ICD-10-CM | POA: Diagnosis not present

## 2022-04-28 DIAGNOSIS — G894 Chronic pain syndrome: Secondary | ICD-10-CM | POA: Diagnosis not present

## 2022-04-28 DIAGNOSIS — Z903 Acquired absence of stomach [part of]: Secondary | ICD-10-CM | POA: Diagnosis not present

## 2022-04-28 DIAGNOSIS — R296 Repeated falls: Secondary | ICD-10-CM | POA: Diagnosis not present

## 2022-04-28 DIAGNOSIS — G300 Alzheimer's disease with early onset: Secondary | ICD-10-CM | POA: Diagnosis not present

## 2022-04-28 DIAGNOSIS — E119 Type 2 diabetes mellitus without complications: Secondary | ICD-10-CM | POA: Diagnosis not present

## 2022-04-28 DIAGNOSIS — M47816 Spondylosis without myelopathy or radiculopathy, lumbar region: Secondary | ICD-10-CM | POA: Diagnosis not present

## 2022-04-28 DIAGNOSIS — Z8673 Personal history of transient ischemic attack (TIA), and cerebral infarction without residual deficits: Secondary | ICD-10-CM | POA: Diagnosis not present

## 2022-04-28 DIAGNOSIS — T8450XS Infection and inflammatory reaction due to unspecified internal joint prosthesis, sequela: Secondary | ICD-10-CM | POA: Diagnosis not present

## 2022-04-28 DIAGNOSIS — R2681 Unsteadiness on feet: Secondary | ICD-10-CM | POA: Diagnosis not present

## 2022-04-28 DIAGNOSIS — D500 Iron deficiency anemia secondary to blood loss (chronic): Secondary | ICD-10-CM | POA: Diagnosis not present

## 2022-04-28 DIAGNOSIS — I129 Hypertensive chronic kidney disease with stage 1 through stage 4 chronic kidney disease, or unspecified chronic kidney disease: Secondary | ICD-10-CM | POA: Diagnosis not present

## 2022-04-28 DIAGNOSIS — D649 Anemia, unspecified: Secondary | ICD-10-CM | POA: Diagnosis not present

## 2022-04-28 DIAGNOSIS — Z85830 Personal history of malignant neoplasm of bone: Secondary | ICD-10-CM | POA: Diagnosis not present

## 2022-04-28 DIAGNOSIS — D638 Anemia in other chronic diseases classified elsewhere: Secondary | ICD-10-CM | POA: Diagnosis not present

## 2022-04-28 DIAGNOSIS — M6281 Muscle weakness (generalized): Secondary | ICD-10-CM | POA: Diagnosis not present

## 2022-04-28 DIAGNOSIS — R531 Weakness: Secondary | ICD-10-CM | POA: Diagnosis not present

## 2022-04-28 DIAGNOSIS — E039 Hypothyroidism, unspecified: Secondary | ICD-10-CM | POA: Diagnosis not present

## 2022-04-28 DIAGNOSIS — D519 Vitamin B12 deficiency anemia, unspecified: Secondary | ICD-10-CM | POA: Diagnosis not present

## 2022-04-29 DIAGNOSIS — M47816 Spondylosis without myelopathy or radiculopathy, lumbar region: Secondary | ICD-10-CM | POA: Diagnosis not present

## 2022-04-29 DIAGNOSIS — G894 Chronic pain syndrome: Secondary | ICD-10-CM | POA: Diagnosis not present

## 2022-04-29 DIAGNOSIS — E039 Hypothyroidism, unspecified: Secondary | ICD-10-CM | POA: Diagnosis not present

## 2022-04-29 DIAGNOSIS — I129 Hypertensive chronic kidney disease with stage 1 through stage 4 chronic kidney disease, or unspecified chronic kidney disease: Secondary | ICD-10-CM | POA: Diagnosis not present

## 2022-04-29 DIAGNOSIS — R296 Repeated falls: Secondary | ICD-10-CM | POA: Diagnosis not present

## 2022-04-29 DIAGNOSIS — Z8673 Personal history of transient ischemic attack (TIA), and cerebral infarction without residual deficits: Secondary | ICD-10-CM | POA: Diagnosis not present

## 2022-04-29 DIAGNOSIS — M6281 Muscle weakness (generalized): Secondary | ICD-10-CM | POA: Diagnosis not present

## 2022-04-29 DIAGNOSIS — R531 Weakness: Secondary | ICD-10-CM | POA: Diagnosis not present

## 2022-04-29 DIAGNOSIS — G300 Alzheimer's disease with early onset: Secondary | ICD-10-CM | POA: Diagnosis not present

## 2022-04-29 DIAGNOSIS — R2681 Unsteadiness on feet: Secondary | ICD-10-CM | POA: Diagnosis not present

## 2022-04-29 DIAGNOSIS — D649 Anemia, unspecified: Secondary | ICD-10-CM | POA: Diagnosis not present

## 2022-04-29 DIAGNOSIS — D519 Vitamin B12 deficiency anemia, unspecified: Secondary | ICD-10-CM | POA: Diagnosis not present

## 2022-04-29 DIAGNOSIS — E119 Type 2 diabetes mellitus without complications: Secondary | ICD-10-CM | POA: Diagnosis not present

## 2022-04-30 DIAGNOSIS — D649 Anemia, unspecified: Secondary | ICD-10-CM | POA: Diagnosis not present

## 2022-04-30 DIAGNOSIS — G300 Alzheimer's disease with early onset: Secondary | ICD-10-CM | POA: Diagnosis not present

## 2022-04-30 DIAGNOSIS — M47816 Spondylosis without myelopathy or radiculopathy, lumbar region: Secondary | ICD-10-CM | POA: Diagnosis not present

## 2022-04-30 DIAGNOSIS — E039 Hypothyroidism, unspecified: Secondary | ICD-10-CM | POA: Diagnosis not present

## 2022-04-30 DIAGNOSIS — R8271 Bacteriuria: Secondary | ICD-10-CM | POA: Diagnosis not present

## 2022-04-30 DIAGNOSIS — T8459XD Infection and inflammatory reaction due to other internal joint prosthesis, subsequent encounter: Secondary | ICD-10-CM | POA: Diagnosis not present

## 2022-04-30 DIAGNOSIS — D519 Vitamin B12 deficiency anemia, unspecified: Secondary | ICD-10-CM | POA: Diagnosis not present

## 2022-04-30 DIAGNOSIS — I10 Essential (primary) hypertension: Secondary | ICD-10-CM | POA: Diagnosis not present

## 2022-04-30 DIAGNOSIS — R531 Weakness: Secondary | ICD-10-CM | POA: Diagnosis not present

## 2022-04-30 DIAGNOSIS — I129 Hypertensive chronic kidney disease with stage 1 through stage 4 chronic kidney disease, or unspecified chronic kidney disease: Secondary | ICD-10-CM | POA: Diagnosis not present

## 2022-04-30 DIAGNOSIS — E785 Hyperlipidemia, unspecified: Secondary | ICD-10-CM | POA: Diagnosis not present

## 2022-04-30 DIAGNOSIS — E119 Type 2 diabetes mellitus without complications: Secondary | ICD-10-CM | POA: Diagnosis not present

## 2022-04-30 DIAGNOSIS — G894 Chronic pain syndrome: Secondary | ICD-10-CM | POA: Diagnosis not present

## 2022-05-03 DIAGNOSIS — I10 Essential (primary) hypertension: Secondary | ICD-10-CM | POA: Diagnosis not present

## 2022-05-03 DIAGNOSIS — E039 Hypothyroidism, unspecified: Secondary | ICD-10-CM | POA: Diagnosis not present

## 2022-05-03 DIAGNOSIS — D649 Anemia, unspecified: Secondary | ICD-10-CM | POA: Diagnosis not present

## 2022-05-03 DIAGNOSIS — R8271 Bacteriuria: Secondary | ICD-10-CM | POA: Diagnosis not present

## 2022-05-03 DIAGNOSIS — G894 Chronic pain syndrome: Secondary | ICD-10-CM | POA: Diagnosis not present

## 2022-05-03 DIAGNOSIS — T8459XD Infection and inflammatory reaction due to other internal joint prosthesis, subsequent encounter: Secondary | ICD-10-CM | POA: Diagnosis not present

## 2022-05-03 DIAGNOSIS — D519 Vitamin B12 deficiency anemia, unspecified: Secondary | ICD-10-CM | POA: Diagnosis not present

## 2022-05-03 DIAGNOSIS — G300 Alzheimer's disease with early onset: Secondary | ICD-10-CM | POA: Diagnosis not present

## 2022-05-03 DIAGNOSIS — I129 Hypertensive chronic kidney disease with stage 1 through stage 4 chronic kidney disease, or unspecified chronic kidney disease: Secondary | ICD-10-CM | POA: Diagnosis not present

## 2022-05-03 DIAGNOSIS — R531 Weakness: Secondary | ICD-10-CM | POA: Diagnosis not present

## 2022-05-03 DIAGNOSIS — M47816 Spondylosis without myelopathy or radiculopathy, lumbar region: Secondary | ICD-10-CM | POA: Diagnosis not present

## 2022-05-03 DIAGNOSIS — E119 Type 2 diabetes mellitus without complications: Secondary | ICD-10-CM | POA: Diagnosis not present

## 2022-05-03 DIAGNOSIS — E785 Hyperlipidemia, unspecified: Secondary | ICD-10-CM | POA: Diagnosis not present

## 2022-05-04 DIAGNOSIS — D519 Vitamin B12 deficiency anemia, unspecified: Secondary | ICD-10-CM | POA: Diagnosis not present

## 2022-05-04 DIAGNOSIS — D649 Anemia, unspecified: Secondary | ICD-10-CM | POA: Diagnosis not present

## 2022-05-04 DIAGNOSIS — M47816 Spondylosis without myelopathy or radiculopathy, lumbar region: Secondary | ICD-10-CM | POA: Diagnosis not present

## 2022-05-04 DIAGNOSIS — G894 Chronic pain syndrome: Secondary | ICD-10-CM | POA: Diagnosis not present

## 2022-05-04 DIAGNOSIS — T8459XD Infection and inflammatory reaction due to other internal joint prosthesis, subsequent encounter: Secondary | ICD-10-CM | POA: Diagnosis not present

## 2022-05-04 DIAGNOSIS — R8271 Bacteriuria: Secondary | ICD-10-CM | POA: Diagnosis not present

## 2022-05-04 DIAGNOSIS — E785 Hyperlipidemia, unspecified: Secondary | ICD-10-CM | POA: Diagnosis not present

## 2022-05-04 DIAGNOSIS — G300 Alzheimer's disease with early onset: Secondary | ICD-10-CM | POA: Diagnosis not present

## 2022-05-04 DIAGNOSIS — E039 Hypothyroidism, unspecified: Secondary | ICD-10-CM | POA: Diagnosis not present

## 2022-05-04 DIAGNOSIS — I10 Essential (primary) hypertension: Secondary | ICD-10-CM | POA: Diagnosis not present

## 2022-05-04 DIAGNOSIS — E119 Type 2 diabetes mellitus without complications: Secondary | ICD-10-CM | POA: Diagnosis not present

## 2022-05-04 DIAGNOSIS — I129 Hypertensive chronic kidney disease with stage 1 through stage 4 chronic kidney disease, or unspecified chronic kidney disease: Secondary | ICD-10-CM | POA: Diagnosis not present

## 2022-05-04 DIAGNOSIS — R531 Weakness: Secondary | ICD-10-CM | POA: Diagnosis not present

## 2022-05-05 DIAGNOSIS — E119 Type 2 diabetes mellitus without complications: Secondary | ICD-10-CM | POA: Diagnosis not present

## 2022-05-05 DIAGNOSIS — E785 Hyperlipidemia, unspecified: Secondary | ICD-10-CM | POA: Diagnosis not present

## 2022-05-05 DIAGNOSIS — M47816 Spondylosis without myelopathy or radiculopathy, lumbar region: Secondary | ICD-10-CM | POA: Diagnosis not present

## 2022-05-05 DIAGNOSIS — D519 Vitamin B12 deficiency anemia, unspecified: Secondary | ICD-10-CM | POA: Diagnosis not present

## 2022-05-05 DIAGNOSIS — T8459XD Infection and inflammatory reaction due to other internal joint prosthesis, subsequent encounter: Secondary | ICD-10-CM | POA: Diagnosis not present

## 2022-05-05 DIAGNOSIS — I10 Essential (primary) hypertension: Secondary | ICD-10-CM | POA: Diagnosis not present

## 2022-05-05 DIAGNOSIS — I129 Hypertensive chronic kidney disease with stage 1 through stage 4 chronic kidney disease, or unspecified chronic kidney disease: Secondary | ICD-10-CM | POA: Diagnosis not present

## 2022-05-05 DIAGNOSIS — E039 Hypothyroidism, unspecified: Secondary | ICD-10-CM | POA: Diagnosis not present

## 2022-05-05 DIAGNOSIS — R8271 Bacteriuria: Secondary | ICD-10-CM | POA: Diagnosis not present

## 2022-05-05 DIAGNOSIS — R531 Weakness: Secondary | ICD-10-CM | POA: Diagnosis not present

## 2022-05-05 DIAGNOSIS — G300 Alzheimer's disease with early onset: Secondary | ICD-10-CM | POA: Diagnosis not present

## 2022-05-05 DIAGNOSIS — G894 Chronic pain syndrome: Secondary | ICD-10-CM | POA: Diagnosis not present

## 2022-05-05 DIAGNOSIS — D649 Anemia, unspecified: Secondary | ICD-10-CM | POA: Diagnosis not present

## 2022-05-06 DIAGNOSIS — T8459XD Infection and inflammatory reaction due to other internal joint prosthesis, subsequent encounter: Secondary | ICD-10-CM | POA: Diagnosis not present

## 2022-05-06 DIAGNOSIS — M47816 Spondylosis without myelopathy or radiculopathy, lumbar region: Secondary | ICD-10-CM | POA: Diagnosis not present

## 2022-05-06 DIAGNOSIS — R531 Weakness: Secondary | ICD-10-CM | POA: Diagnosis not present

## 2022-05-06 DIAGNOSIS — I129 Hypertensive chronic kidney disease with stage 1 through stage 4 chronic kidney disease, or unspecified chronic kidney disease: Secondary | ICD-10-CM | POA: Diagnosis not present

## 2022-05-06 DIAGNOSIS — E119 Type 2 diabetes mellitus without complications: Secondary | ICD-10-CM | POA: Diagnosis not present

## 2022-05-06 DIAGNOSIS — G300 Alzheimer's disease with early onset: Secondary | ICD-10-CM | POA: Diagnosis not present

## 2022-05-06 DIAGNOSIS — E785 Hyperlipidemia, unspecified: Secondary | ICD-10-CM | POA: Diagnosis not present

## 2022-05-06 DIAGNOSIS — D649 Anemia, unspecified: Secondary | ICD-10-CM | POA: Diagnosis not present

## 2022-05-06 DIAGNOSIS — E039 Hypothyroidism, unspecified: Secondary | ICD-10-CM | POA: Diagnosis not present

## 2022-05-06 DIAGNOSIS — R8271 Bacteriuria: Secondary | ICD-10-CM | POA: Diagnosis not present

## 2022-05-06 DIAGNOSIS — I10 Essential (primary) hypertension: Secondary | ICD-10-CM | POA: Diagnosis not present

## 2022-05-06 DIAGNOSIS — G894 Chronic pain syndrome: Secondary | ICD-10-CM | POA: Diagnosis not present

## 2022-05-06 DIAGNOSIS — D519 Vitamin B12 deficiency anemia, unspecified: Secondary | ICD-10-CM | POA: Diagnosis not present

## 2022-05-07 DIAGNOSIS — N189 Chronic kidney disease, unspecified: Secondary | ICD-10-CM | POA: Diagnosis not present

## 2022-05-07 DIAGNOSIS — T8459XD Infection and inflammatory reaction due to other internal joint prosthesis, subsequent encounter: Secondary | ICD-10-CM | POA: Diagnosis not present

## 2022-05-07 DIAGNOSIS — I10 Essential (primary) hypertension: Secondary | ICD-10-CM | POA: Diagnosis not present

## 2022-05-07 DIAGNOSIS — E119 Type 2 diabetes mellitus without complications: Secondary | ICD-10-CM | POA: Diagnosis not present

## 2022-05-07 DIAGNOSIS — D519 Vitamin B12 deficiency anemia, unspecified: Secondary | ICD-10-CM | POA: Diagnosis not present

## 2022-05-07 DIAGNOSIS — M47816 Spondylosis without myelopathy or radiculopathy, lumbar region: Secondary | ICD-10-CM | POA: Diagnosis not present

## 2022-05-07 DIAGNOSIS — I129 Hypertensive chronic kidney disease with stage 1 through stage 4 chronic kidney disease, or unspecified chronic kidney disease: Secondary | ICD-10-CM | POA: Diagnosis not present

## 2022-05-07 DIAGNOSIS — E039 Hypothyroidism, unspecified: Secondary | ICD-10-CM | POA: Diagnosis not present

## 2022-05-07 DIAGNOSIS — G300 Alzheimer's disease with early onset: Secondary | ICD-10-CM | POA: Diagnosis not present

## 2022-05-07 DIAGNOSIS — I4891 Unspecified atrial fibrillation: Secondary | ICD-10-CM | POA: Diagnosis not present

## 2022-05-07 DIAGNOSIS — R531 Weakness: Secondary | ICD-10-CM | POA: Diagnosis not present

## 2022-05-07 DIAGNOSIS — E785 Hyperlipidemia, unspecified: Secondary | ICD-10-CM | POA: Diagnosis not present

## 2022-05-07 DIAGNOSIS — G894 Chronic pain syndrome: Secondary | ICD-10-CM | POA: Diagnosis not present

## 2022-05-07 DIAGNOSIS — R8271 Bacteriuria: Secondary | ICD-10-CM | POA: Diagnosis not present

## 2022-05-07 DIAGNOSIS — G459 Transient cerebral ischemic attack, unspecified: Secondary | ICD-10-CM | POA: Diagnosis not present

## 2022-05-07 DIAGNOSIS — D649 Anemia, unspecified: Secondary | ICD-10-CM | POA: Diagnosis not present

## 2022-05-10 DIAGNOSIS — E785 Hyperlipidemia, unspecified: Secondary | ICD-10-CM | POA: Diagnosis not present

## 2022-05-10 DIAGNOSIS — D519 Vitamin B12 deficiency anemia, unspecified: Secondary | ICD-10-CM | POA: Diagnosis not present

## 2022-05-10 DIAGNOSIS — R531 Weakness: Secondary | ICD-10-CM | POA: Diagnosis not present

## 2022-05-10 DIAGNOSIS — G300 Alzheimer's disease with early onset: Secondary | ICD-10-CM | POA: Diagnosis not present

## 2022-05-10 DIAGNOSIS — I129 Hypertensive chronic kidney disease with stage 1 through stage 4 chronic kidney disease, or unspecified chronic kidney disease: Secondary | ICD-10-CM | POA: Diagnosis not present

## 2022-05-10 DIAGNOSIS — E039 Hypothyroidism, unspecified: Secondary | ICD-10-CM | POA: Diagnosis not present

## 2022-05-10 DIAGNOSIS — G894 Chronic pain syndrome: Secondary | ICD-10-CM | POA: Diagnosis not present

## 2022-05-10 DIAGNOSIS — M47816 Spondylosis without myelopathy or radiculopathy, lumbar region: Secondary | ICD-10-CM | POA: Diagnosis not present

## 2022-05-10 DIAGNOSIS — T8459XD Infection and inflammatory reaction due to other internal joint prosthesis, subsequent encounter: Secondary | ICD-10-CM | POA: Diagnosis not present

## 2022-05-10 DIAGNOSIS — I10 Essential (primary) hypertension: Secondary | ICD-10-CM | POA: Diagnosis not present

## 2022-05-10 DIAGNOSIS — D649 Anemia, unspecified: Secondary | ICD-10-CM | POA: Diagnosis not present

## 2022-05-10 DIAGNOSIS — R8271 Bacteriuria: Secondary | ICD-10-CM | POA: Diagnosis not present

## 2022-05-10 DIAGNOSIS — E119 Type 2 diabetes mellitus without complications: Secondary | ICD-10-CM | POA: Diagnosis not present

## 2022-05-11 DIAGNOSIS — E119 Type 2 diabetes mellitus without complications: Secondary | ICD-10-CM | POA: Diagnosis not present

## 2022-05-11 DIAGNOSIS — R531 Weakness: Secondary | ICD-10-CM | POA: Diagnosis not present

## 2022-05-11 DIAGNOSIS — D519 Vitamin B12 deficiency anemia, unspecified: Secondary | ICD-10-CM | POA: Diagnosis not present

## 2022-05-11 DIAGNOSIS — M47816 Spondylosis without myelopathy or radiculopathy, lumbar region: Secondary | ICD-10-CM | POA: Diagnosis not present

## 2022-05-11 DIAGNOSIS — E785 Hyperlipidemia, unspecified: Secondary | ICD-10-CM | POA: Diagnosis not present

## 2022-05-11 DIAGNOSIS — E039 Hypothyroidism, unspecified: Secondary | ICD-10-CM | POA: Diagnosis not present

## 2022-05-11 DIAGNOSIS — R8271 Bacteriuria: Secondary | ICD-10-CM | POA: Diagnosis not present

## 2022-05-11 DIAGNOSIS — D649 Anemia, unspecified: Secondary | ICD-10-CM | POA: Diagnosis not present

## 2022-05-11 DIAGNOSIS — G894 Chronic pain syndrome: Secondary | ICD-10-CM | POA: Diagnosis not present

## 2022-05-11 DIAGNOSIS — I129 Hypertensive chronic kidney disease with stage 1 through stage 4 chronic kidney disease, or unspecified chronic kidney disease: Secondary | ICD-10-CM | POA: Diagnosis not present

## 2022-05-11 DIAGNOSIS — G300 Alzheimer's disease with early onset: Secondary | ICD-10-CM | POA: Diagnosis not present

## 2022-05-11 DIAGNOSIS — I10 Essential (primary) hypertension: Secondary | ICD-10-CM | POA: Diagnosis not present

## 2022-05-11 DIAGNOSIS — T8459XD Infection and inflammatory reaction due to other internal joint prosthesis, subsequent encounter: Secondary | ICD-10-CM | POA: Diagnosis not present

## 2022-05-12 DIAGNOSIS — D649 Anemia, unspecified: Secondary | ICD-10-CM | POA: Diagnosis not present

## 2022-05-12 DIAGNOSIS — Z7901 Long term (current) use of anticoagulants: Secondary | ICD-10-CM | POA: Diagnosis not present

## 2022-05-12 DIAGNOSIS — Z96659 Presence of unspecified artificial knee joint: Secondary | ICD-10-CM | POA: Diagnosis not present

## 2022-05-12 DIAGNOSIS — E119 Type 2 diabetes mellitus without complications: Secondary | ICD-10-CM | POA: Diagnosis not present

## 2022-05-12 DIAGNOSIS — D519 Vitamin B12 deficiency anemia, unspecified: Secondary | ICD-10-CM | POA: Diagnosis not present

## 2022-05-12 DIAGNOSIS — R8271 Bacteriuria: Secondary | ICD-10-CM | POA: Diagnosis not present

## 2022-05-12 DIAGNOSIS — B957 Other staphylococcus as the cause of diseases classified elsewhere: Secondary | ICD-10-CM | POA: Diagnosis not present

## 2022-05-12 DIAGNOSIS — G300 Alzheimer's disease with early onset: Secondary | ICD-10-CM | POA: Diagnosis not present

## 2022-05-12 DIAGNOSIS — R197 Diarrhea, unspecified: Secondary | ICD-10-CM | POA: Diagnosis not present

## 2022-05-12 DIAGNOSIS — I129 Hypertensive chronic kidney disease with stage 1 through stage 4 chronic kidney disease, or unspecified chronic kidney disease: Secondary | ICD-10-CM | POA: Diagnosis not present

## 2022-05-12 DIAGNOSIS — R531 Weakness: Secondary | ICD-10-CM | POA: Diagnosis not present

## 2022-05-12 DIAGNOSIS — I82592 Chronic embolism and thrombosis of other specified deep vein of left lower extremity: Secondary | ICD-10-CM | POA: Diagnosis not present

## 2022-05-12 DIAGNOSIS — E039 Hypothyroidism, unspecified: Secondary | ICD-10-CM | POA: Diagnosis not present

## 2022-05-12 DIAGNOSIS — E785 Hyperlipidemia, unspecified: Secondary | ICD-10-CM | POA: Diagnosis not present

## 2022-05-12 DIAGNOSIS — M47816 Spondylosis without myelopathy or radiculopathy, lumbar region: Secondary | ICD-10-CM | POA: Diagnosis not present

## 2022-05-12 DIAGNOSIS — G894 Chronic pain syndrome: Secondary | ICD-10-CM | POA: Diagnosis not present

## 2022-05-12 DIAGNOSIS — T8459XD Infection and inflammatory reaction due to other internal joint prosthesis, subsequent encounter: Secondary | ICD-10-CM | POA: Diagnosis not present

## 2022-05-12 DIAGNOSIS — I10 Essential (primary) hypertension: Secondary | ICD-10-CM | POA: Diagnosis not present

## 2022-05-12 DIAGNOSIS — B950 Streptococcus, group A, as the cause of diseases classified elsewhere: Secondary | ICD-10-CM | POA: Diagnosis not present

## 2022-05-12 DIAGNOSIS — Z881 Allergy status to other antibiotic agents status: Secondary | ICD-10-CM | POA: Diagnosis not present

## 2022-05-12 DIAGNOSIS — N1832 Chronic kidney disease, stage 3b: Secondary | ICD-10-CM | POA: Diagnosis not present

## 2022-05-13 DIAGNOSIS — I10 Essential (primary) hypertension: Secondary | ICD-10-CM | POA: Diagnosis not present

## 2022-05-13 DIAGNOSIS — R8271 Bacteriuria: Secondary | ICD-10-CM | POA: Diagnosis not present

## 2022-05-13 DIAGNOSIS — E785 Hyperlipidemia, unspecified: Secondary | ICD-10-CM | POA: Diagnosis not present

## 2022-05-13 DIAGNOSIS — G894 Chronic pain syndrome: Secondary | ICD-10-CM | POA: Diagnosis not present

## 2022-05-13 DIAGNOSIS — I129 Hypertensive chronic kidney disease with stage 1 through stage 4 chronic kidney disease, or unspecified chronic kidney disease: Secondary | ICD-10-CM | POA: Diagnosis not present

## 2022-05-13 DIAGNOSIS — R531 Weakness: Secondary | ICD-10-CM | POA: Diagnosis not present

## 2022-05-13 DIAGNOSIS — G300 Alzheimer's disease with early onset: Secondary | ICD-10-CM | POA: Diagnosis not present

## 2022-05-13 DIAGNOSIS — E119 Type 2 diabetes mellitus without complications: Secondary | ICD-10-CM | POA: Diagnosis not present

## 2022-05-13 DIAGNOSIS — E039 Hypothyroidism, unspecified: Secondary | ICD-10-CM | POA: Diagnosis not present

## 2022-05-13 DIAGNOSIS — D519 Vitamin B12 deficiency anemia, unspecified: Secondary | ICD-10-CM | POA: Diagnosis not present

## 2022-05-13 DIAGNOSIS — M47816 Spondylosis without myelopathy or radiculopathy, lumbar region: Secondary | ICD-10-CM | POA: Diagnosis not present

## 2022-05-13 DIAGNOSIS — T8459XD Infection and inflammatory reaction due to other internal joint prosthesis, subsequent encounter: Secondary | ICD-10-CM | POA: Diagnosis not present

## 2022-05-13 DIAGNOSIS — D649 Anemia, unspecified: Secondary | ICD-10-CM | POA: Diagnosis not present

## 2022-05-14 DIAGNOSIS — T8454XD Infection and inflammatory reaction due to internal left knee prosthesis, subsequent encounter: Secondary | ICD-10-CM | POA: Diagnosis not present

## 2022-05-17 DIAGNOSIS — E119 Type 2 diabetes mellitus without complications: Secondary | ICD-10-CM | POA: Diagnosis not present

## 2022-05-17 DIAGNOSIS — R8271 Bacteriuria: Secondary | ICD-10-CM | POA: Diagnosis not present

## 2022-05-17 DIAGNOSIS — G894 Chronic pain syndrome: Secondary | ICD-10-CM | POA: Diagnosis not present

## 2022-05-17 DIAGNOSIS — I10 Essential (primary) hypertension: Secondary | ICD-10-CM | POA: Diagnosis not present

## 2022-05-17 DIAGNOSIS — G300 Alzheimer's disease with early onset: Secondary | ICD-10-CM | POA: Diagnosis not present

## 2022-05-17 DIAGNOSIS — D519 Vitamin B12 deficiency anemia, unspecified: Secondary | ICD-10-CM | POA: Diagnosis not present

## 2022-05-17 DIAGNOSIS — I129 Hypertensive chronic kidney disease with stage 1 through stage 4 chronic kidney disease, or unspecified chronic kidney disease: Secondary | ICD-10-CM | POA: Diagnosis not present

## 2022-05-17 DIAGNOSIS — R531 Weakness: Secondary | ICD-10-CM | POA: Diagnosis not present

## 2022-05-17 DIAGNOSIS — M47816 Spondylosis without myelopathy or radiculopathy, lumbar region: Secondary | ICD-10-CM | POA: Diagnosis not present

## 2022-05-17 DIAGNOSIS — D649 Anemia, unspecified: Secondary | ICD-10-CM | POA: Diagnosis not present

## 2022-05-17 DIAGNOSIS — T8459XD Infection and inflammatory reaction due to other internal joint prosthesis, subsequent encounter: Secondary | ICD-10-CM | POA: Diagnosis not present

## 2022-05-17 DIAGNOSIS — E039 Hypothyroidism, unspecified: Secondary | ICD-10-CM | POA: Diagnosis not present

## 2022-05-17 DIAGNOSIS — E785 Hyperlipidemia, unspecified: Secondary | ICD-10-CM | POA: Diagnosis not present

## 2022-05-18 DIAGNOSIS — E119 Type 2 diabetes mellitus without complications: Secondary | ICD-10-CM | POA: Diagnosis not present

## 2022-05-18 DIAGNOSIS — M47816 Spondylosis without myelopathy or radiculopathy, lumbar region: Secondary | ICD-10-CM | POA: Diagnosis not present

## 2022-05-18 DIAGNOSIS — R8271 Bacteriuria: Secondary | ICD-10-CM | POA: Diagnosis not present

## 2022-05-18 DIAGNOSIS — D519 Vitamin B12 deficiency anemia, unspecified: Secondary | ICD-10-CM | POA: Diagnosis not present

## 2022-05-18 DIAGNOSIS — R531 Weakness: Secondary | ICD-10-CM | POA: Diagnosis not present

## 2022-05-18 DIAGNOSIS — E785 Hyperlipidemia, unspecified: Secondary | ICD-10-CM | POA: Diagnosis not present

## 2022-05-18 DIAGNOSIS — I129 Hypertensive chronic kidney disease with stage 1 through stage 4 chronic kidney disease, or unspecified chronic kidney disease: Secondary | ICD-10-CM | POA: Diagnosis not present

## 2022-05-18 DIAGNOSIS — E039 Hypothyroidism, unspecified: Secondary | ICD-10-CM | POA: Diagnosis not present

## 2022-05-18 DIAGNOSIS — D649 Anemia, unspecified: Secondary | ICD-10-CM | POA: Diagnosis not present

## 2022-05-18 DIAGNOSIS — G300 Alzheimer's disease with early onset: Secondary | ICD-10-CM | POA: Diagnosis not present

## 2022-05-18 DIAGNOSIS — G894 Chronic pain syndrome: Secondary | ICD-10-CM | POA: Diagnosis not present

## 2022-05-18 DIAGNOSIS — I10 Essential (primary) hypertension: Secondary | ICD-10-CM | POA: Diagnosis not present

## 2022-05-18 DIAGNOSIS — T8459XD Infection and inflammatory reaction due to other internal joint prosthesis, subsequent encounter: Secondary | ICD-10-CM | POA: Diagnosis not present

## 2022-05-19 DIAGNOSIS — M47816 Spondylosis without myelopathy or radiculopathy, lumbar region: Secondary | ICD-10-CM | POA: Diagnosis not present

## 2022-05-19 DIAGNOSIS — I129 Hypertensive chronic kidney disease with stage 1 through stage 4 chronic kidney disease, or unspecified chronic kidney disease: Secondary | ICD-10-CM | POA: Diagnosis not present

## 2022-05-19 DIAGNOSIS — D649 Anemia, unspecified: Secondary | ICD-10-CM | POA: Diagnosis not present

## 2022-05-19 DIAGNOSIS — D519 Vitamin B12 deficiency anemia, unspecified: Secondary | ICD-10-CM | POA: Diagnosis not present

## 2022-05-19 DIAGNOSIS — G300 Alzheimer's disease with early onset: Secondary | ICD-10-CM | POA: Diagnosis not present

## 2022-05-19 DIAGNOSIS — G894 Chronic pain syndrome: Secondary | ICD-10-CM | POA: Diagnosis not present

## 2022-05-19 DIAGNOSIS — R531 Weakness: Secondary | ICD-10-CM | POA: Diagnosis not present

## 2022-05-19 DIAGNOSIS — T8459XD Infection and inflammatory reaction due to other internal joint prosthesis, subsequent encounter: Secondary | ICD-10-CM | POA: Diagnosis not present

## 2022-05-19 DIAGNOSIS — E785 Hyperlipidemia, unspecified: Secondary | ICD-10-CM | POA: Diagnosis not present

## 2022-05-19 DIAGNOSIS — E039 Hypothyroidism, unspecified: Secondary | ICD-10-CM | POA: Diagnosis not present

## 2022-05-19 DIAGNOSIS — R8271 Bacteriuria: Secondary | ICD-10-CM | POA: Diagnosis not present

## 2022-05-19 DIAGNOSIS — I10 Essential (primary) hypertension: Secondary | ICD-10-CM | POA: Diagnosis not present

## 2022-05-19 DIAGNOSIS — E119 Type 2 diabetes mellitus without complications: Secondary | ICD-10-CM | POA: Diagnosis not present

## 2022-05-20 DIAGNOSIS — R609 Edema, unspecified: Secondary | ICD-10-CM | POA: Diagnosis not present

## 2022-05-20 DIAGNOSIS — M7989 Other specified soft tissue disorders: Secondary | ICD-10-CM | POA: Diagnosis not present

## 2022-05-21 DIAGNOSIS — G894 Chronic pain syndrome: Secondary | ICD-10-CM | POA: Diagnosis not present

## 2022-05-21 DIAGNOSIS — D649 Anemia, unspecified: Secondary | ICD-10-CM | POA: Diagnosis not present

## 2022-05-21 DIAGNOSIS — I82409 Acute embolism and thrombosis of unspecified deep veins of unspecified lower extremity: Secondary | ICD-10-CM | POA: Diagnosis not present

## 2022-05-21 DIAGNOSIS — R531 Weakness: Secondary | ICD-10-CM | POA: Diagnosis not present

## 2022-05-21 DIAGNOSIS — I129 Hypertensive chronic kidney disease with stage 1 through stage 4 chronic kidney disease, or unspecified chronic kidney disease: Secondary | ICD-10-CM | POA: Diagnosis not present

## 2022-05-21 DIAGNOSIS — E039 Hypothyroidism, unspecified: Secondary | ICD-10-CM | POA: Diagnosis not present

## 2022-05-21 DIAGNOSIS — I4891 Unspecified atrial fibrillation: Secondary | ICD-10-CM | POA: Diagnosis not present

## 2022-05-21 DIAGNOSIS — N189 Chronic kidney disease, unspecified: Secondary | ICD-10-CM | POA: Diagnosis not present

## 2022-05-21 DIAGNOSIS — E785 Hyperlipidemia, unspecified: Secondary | ICD-10-CM | POA: Diagnosis not present

## 2022-05-21 DIAGNOSIS — R8271 Bacteriuria: Secondary | ICD-10-CM | POA: Diagnosis not present

## 2022-05-21 DIAGNOSIS — D519 Vitamin B12 deficiency anemia, unspecified: Secondary | ICD-10-CM | POA: Diagnosis not present

## 2022-05-21 DIAGNOSIS — E119 Type 2 diabetes mellitus without complications: Secondary | ICD-10-CM | POA: Diagnosis not present

## 2022-05-21 DIAGNOSIS — T8454XD Infection and inflammatory reaction due to internal left knee prosthesis, subsequent encounter: Secondary | ICD-10-CM | POA: Diagnosis not present

## 2022-05-21 DIAGNOSIS — G300 Alzheimer's disease with early onset: Secondary | ICD-10-CM | POA: Diagnosis not present

## 2022-05-21 DIAGNOSIS — T8459XD Infection and inflammatory reaction due to other internal joint prosthesis, subsequent encounter: Secondary | ICD-10-CM | POA: Diagnosis not present

## 2022-05-21 DIAGNOSIS — G459 Transient cerebral ischemic attack, unspecified: Secondary | ICD-10-CM | POA: Diagnosis not present

## 2022-05-21 DIAGNOSIS — I10 Essential (primary) hypertension: Secondary | ICD-10-CM | POA: Diagnosis not present

## 2022-05-21 DIAGNOSIS — M47816 Spondylosis without myelopathy or radiculopathy, lumbar region: Secondary | ICD-10-CM | POA: Diagnosis not present

## 2022-05-24 DIAGNOSIS — D519 Vitamin B12 deficiency anemia, unspecified: Secondary | ICD-10-CM | POA: Diagnosis not present

## 2022-05-24 DIAGNOSIS — E039 Hypothyroidism, unspecified: Secondary | ICD-10-CM | POA: Diagnosis not present

## 2022-05-24 DIAGNOSIS — M47816 Spondylosis without myelopathy or radiculopathy, lumbar region: Secondary | ICD-10-CM | POA: Diagnosis not present

## 2022-05-24 DIAGNOSIS — N189 Chronic kidney disease, unspecified: Secondary | ICD-10-CM | POA: Diagnosis not present

## 2022-05-24 DIAGNOSIS — G300 Alzheimer's disease with early onset: Secondary | ICD-10-CM | POA: Diagnosis not present

## 2022-05-24 DIAGNOSIS — R531 Weakness: Secondary | ICD-10-CM | POA: Diagnosis not present

## 2022-05-24 DIAGNOSIS — E785 Hyperlipidemia, unspecified: Secondary | ICD-10-CM | POA: Diagnosis not present

## 2022-05-24 DIAGNOSIS — I4891 Unspecified atrial fibrillation: Secondary | ICD-10-CM | POA: Diagnosis not present

## 2022-05-24 DIAGNOSIS — M549 Dorsalgia, unspecified: Secondary | ICD-10-CM | POA: Diagnosis not present

## 2022-05-24 DIAGNOSIS — D649 Anemia, unspecified: Secondary | ICD-10-CM | POA: Diagnosis not present

## 2022-05-24 DIAGNOSIS — I82409 Acute embolism and thrombosis of unspecified deep veins of unspecified lower extremity: Secondary | ICD-10-CM | POA: Diagnosis not present

## 2022-05-24 DIAGNOSIS — I10 Essential (primary) hypertension: Secondary | ICD-10-CM | POA: Diagnosis not present

## 2022-05-24 DIAGNOSIS — R8271 Bacteriuria: Secondary | ICD-10-CM | POA: Diagnosis not present

## 2022-05-24 DIAGNOSIS — G894 Chronic pain syndrome: Secondary | ICD-10-CM | POA: Diagnosis not present

## 2022-05-24 DIAGNOSIS — E119 Type 2 diabetes mellitus without complications: Secondary | ICD-10-CM | POA: Diagnosis not present

## 2022-05-24 DIAGNOSIS — I129 Hypertensive chronic kidney disease with stage 1 through stage 4 chronic kidney disease, or unspecified chronic kidney disease: Secondary | ICD-10-CM | POA: Diagnosis not present

## 2022-05-24 DIAGNOSIS — T8459XD Infection and inflammatory reaction due to other internal joint prosthesis, subsequent encounter: Secondary | ICD-10-CM | POA: Diagnosis not present

## 2022-05-25 DIAGNOSIS — D500 Iron deficiency anemia secondary to blood loss (chronic): Secondary | ICD-10-CM | POA: Diagnosis not present

## 2022-05-25 DIAGNOSIS — E119 Type 2 diabetes mellitus without complications: Secondary | ICD-10-CM | POA: Diagnosis not present

## 2022-05-25 DIAGNOSIS — E785 Hyperlipidemia, unspecified: Secondary | ICD-10-CM | POA: Diagnosis not present

## 2022-05-25 DIAGNOSIS — G894 Chronic pain syndrome: Secondary | ICD-10-CM | POA: Diagnosis not present

## 2022-05-25 DIAGNOSIS — G300 Alzheimer's disease with early onset: Secondary | ICD-10-CM | POA: Diagnosis not present

## 2022-05-25 DIAGNOSIS — D638 Anemia in other chronic diseases classified elsewhere: Secondary | ICD-10-CM | POA: Diagnosis not present

## 2022-05-25 DIAGNOSIS — Z903 Acquired absence of stomach [part of]: Secondary | ICD-10-CM | POA: Diagnosis not present

## 2022-05-25 DIAGNOSIS — E039 Hypothyroidism, unspecified: Secondary | ICD-10-CM | POA: Diagnosis not present

## 2022-05-25 DIAGNOSIS — K909 Intestinal malabsorption, unspecified: Secondary | ICD-10-CM | POA: Diagnosis not present

## 2022-05-25 DIAGNOSIS — R8271 Bacteriuria: Secondary | ICD-10-CM | POA: Diagnosis not present

## 2022-05-25 DIAGNOSIS — M47816 Spondylosis without myelopathy or radiculopathy, lumbar region: Secondary | ICD-10-CM | POA: Diagnosis not present

## 2022-05-25 DIAGNOSIS — I10 Essential (primary) hypertension: Secondary | ICD-10-CM | POA: Diagnosis not present

## 2022-05-25 DIAGNOSIS — D519 Vitamin B12 deficiency anemia, unspecified: Secondary | ICD-10-CM | POA: Diagnosis not present

## 2022-05-25 DIAGNOSIS — R531 Weakness: Secondary | ICD-10-CM | POA: Diagnosis not present

## 2022-05-25 DIAGNOSIS — T8459XD Infection and inflammatory reaction due to other internal joint prosthesis, subsequent encounter: Secondary | ICD-10-CM | POA: Diagnosis not present

## 2022-05-25 DIAGNOSIS — I129 Hypertensive chronic kidney disease with stage 1 through stage 4 chronic kidney disease, or unspecified chronic kidney disease: Secondary | ICD-10-CM | POA: Diagnosis not present

## 2022-05-25 DIAGNOSIS — D649 Anemia, unspecified: Secondary | ICD-10-CM | POA: Diagnosis not present

## 2022-05-26 DIAGNOSIS — D649 Anemia, unspecified: Secondary | ICD-10-CM | POA: Diagnosis not present

## 2022-05-26 DIAGNOSIS — E119 Type 2 diabetes mellitus without complications: Secondary | ICD-10-CM | POA: Diagnosis not present

## 2022-05-26 DIAGNOSIS — R531 Weakness: Secondary | ICD-10-CM | POA: Diagnosis not present

## 2022-05-26 DIAGNOSIS — E785 Hyperlipidemia, unspecified: Secondary | ICD-10-CM | POA: Diagnosis not present

## 2022-05-26 DIAGNOSIS — I10 Essential (primary) hypertension: Secondary | ICD-10-CM | POA: Diagnosis not present

## 2022-05-26 DIAGNOSIS — G300 Alzheimer's disease with early onset: Secondary | ICD-10-CM | POA: Diagnosis not present

## 2022-05-26 DIAGNOSIS — E039 Hypothyroidism, unspecified: Secondary | ICD-10-CM | POA: Diagnosis not present

## 2022-05-26 DIAGNOSIS — D519 Vitamin B12 deficiency anemia, unspecified: Secondary | ICD-10-CM | POA: Diagnosis not present

## 2022-05-26 DIAGNOSIS — R8271 Bacteriuria: Secondary | ICD-10-CM | POA: Diagnosis not present

## 2022-05-26 DIAGNOSIS — M47816 Spondylosis without myelopathy or radiculopathy, lumbar region: Secondary | ICD-10-CM | POA: Diagnosis not present

## 2022-05-26 DIAGNOSIS — T8459XD Infection and inflammatory reaction due to other internal joint prosthesis, subsequent encounter: Secondary | ICD-10-CM | POA: Diagnosis not present

## 2022-05-26 DIAGNOSIS — G894 Chronic pain syndrome: Secondary | ICD-10-CM | POA: Diagnosis not present

## 2022-05-26 DIAGNOSIS — I129 Hypertensive chronic kidney disease with stage 1 through stage 4 chronic kidney disease, or unspecified chronic kidney disease: Secondary | ICD-10-CM | POA: Diagnosis not present

## 2022-05-27 DIAGNOSIS — D649 Anemia, unspecified: Secondary | ICD-10-CM | POA: Diagnosis not present

## 2022-05-27 DIAGNOSIS — I129 Hypertensive chronic kidney disease with stage 1 through stage 4 chronic kidney disease, or unspecified chronic kidney disease: Secondary | ICD-10-CM | POA: Diagnosis not present

## 2022-05-27 DIAGNOSIS — M47816 Spondylosis without myelopathy or radiculopathy, lumbar region: Secondary | ICD-10-CM | POA: Diagnosis not present

## 2022-05-27 DIAGNOSIS — E119 Type 2 diabetes mellitus without complications: Secondary | ICD-10-CM | POA: Diagnosis not present

## 2022-05-27 DIAGNOSIS — G894 Chronic pain syndrome: Secondary | ICD-10-CM | POA: Diagnosis not present

## 2022-05-27 DIAGNOSIS — D519 Vitamin B12 deficiency anemia, unspecified: Secondary | ICD-10-CM | POA: Diagnosis not present

## 2022-05-27 DIAGNOSIS — E039 Hypothyroidism, unspecified: Secondary | ICD-10-CM | POA: Diagnosis not present

## 2022-05-27 DIAGNOSIS — I10 Essential (primary) hypertension: Secondary | ICD-10-CM | POA: Diagnosis not present

## 2022-05-27 DIAGNOSIS — G300 Alzheimer's disease with early onset: Secondary | ICD-10-CM | POA: Diagnosis not present

## 2022-05-27 DIAGNOSIS — T8459XD Infection and inflammatory reaction due to other internal joint prosthesis, subsequent encounter: Secondary | ICD-10-CM | POA: Diagnosis not present

## 2022-05-27 DIAGNOSIS — R8271 Bacteriuria: Secondary | ICD-10-CM | POA: Diagnosis not present

## 2022-05-27 DIAGNOSIS — E785 Hyperlipidemia, unspecified: Secondary | ICD-10-CM | POA: Diagnosis not present

## 2022-05-27 DIAGNOSIS — R531 Weakness: Secondary | ICD-10-CM | POA: Diagnosis not present

## 2022-05-28 DIAGNOSIS — I129 Hypertensive chronic kidney disease with stage 1 through stage 4 chronic kidney disease, or unspecified chronic kidney disease: Secondary | ICD-10-CM | POA: Diagnosis not present

## 2022-05-28 DIAGNOSIS — M47816 Spondylosis without myelopathy or radiculopathy, lumbar region: Secondary | ICD-10-CM | POA: Diagnosis not present

## 2022-05-28 DIAGNOSIS — G894 Chronic pain syndrome: Secondary | ICD-10-CM | POA: Diagnosis not present

## 2022-05-28 DIAGNOSIS — G300 Alzheimer's disease with early onset: Secondary | ICD-10-CM | POA: Diagnosis not present

## 2022-05-28 DIAGNOSIS — E039 Hypothyroidism, unspecified: Secondary | ICD-10-CM | POA: Diagnosis not present

## 2022-05-28 DIAGNOSIS — I10 Essential (primary) hypertension: Secondary | ICD-10-CM | POA: Diagnosis not present

## 2022-05-28 DIAGNOSIS — D519 Vitamin B12 deficiency anemia, unspecified: Secondary | ICD-10-CM | POA: Diagnosis not present

## 2022-05-28 DIAGNOSIS — R531 Weakness: Secondary | ICD-10-CM | POA: Diagnosis not present

## 2022-05-28 DIAGNOSIS — T8459XD Infection and inflammatory reaction due to other internal joint prosthesis, subsequent encounter: Secondary | ICD-10-CM | POA: Diagnosis not present

## 2022-05-28 DIAGNOSIS — R8271 Bacteriuria: Secondary | ICD-10-CM | POA: Diagnosis not present

## 2022-05-28 DIAGNOSIS — E119 Type 2 diabetes mellitus without complications: Secondary | ICD-10-CM | POA: Diagnosis not present

## 2022-05-28 DIAGNOSIS — E785 Hyperlipidemia, unspecified: Secondary | ICD-10-CM | POA: Diagnosis not present

## 2022-05-28 DIAGNOSIS — D649 Anemia, unspecified: Secondary | ICD-10-CM | POA: Diagnosis not present

## 2022-05-31 DIAGNOSIS — M47816 Spondylosis without myelopathy or radiculopathy, lumbar region: Secondary | ICD-10-CM | POA: Diagnosis not present

## 2022-05-31 DIAGNOSIS — N1832 Chronic kidney disease, stage 3b: Secondary | ICD-10-CM | POA: Diagnosis not present

## 2022-05-31 DIAGNOSIS — B950 Streptococcus, group A, as the cause of diseases classified elsewhere: Secondary | ICD-10-CM | POA: Diagnosis not present

## 2022-05-31 DIAGNOSIS — D519 Vitamin B12 deficiency anemia, unspecified: Secondary | ICD-10-CM | POA: Diagnosis not present

## 2022-05-31 DIAGNOSIS — T8459XD Infection and inflammatory reaction due to other internal joint prosthesis, subsequent encounter: Secondary | ICD-10-CM | POA: Diagnosis not present

## 2022-05-31 DIAGNOSIS — M109 Gout, unspecified: Secondary | ICD-10-CM | POA: Diagnosis not present

## 2022-05-31 DIAGNOSIS — M6281 Muscle weakness (generalized): Secondary | ICD-10-CM | POA: Diagnosis not present

## 2022-05-31 DIAGNOSIS — B957 Other staphylococcus as the cause of diseases classified elsewhere: Secondary | ICD-10-CM | POA: Diagnosis not present

## 2022-05-31 DIAGNOSIS — Z881 Allergy status to other antibiotic agents status: Secondary | ICD-10-CM | POA: Diagnosis not present

## 2022-05-31 DIAGNOSIS — R54 Age-related physical debility: Secondary | ICD-10-CM | POA: Diagnosis not present

## 2022-05-31 DIAGNOSIS — N390 Urinary tract infection, site not specified: Secondary | ICD-10-CM | POA: Diagnosis not present

## 2022-05-31 DIAGNOSIS — E876 Hypokalemia: Secondary | ICD-10-CM | POA: Diagnosis not present

## 2022-05-31 DIAGNOSIS — Q059 Spina bifida, unspecified: Secondary | ICD-10-CM | POA: Diagnosis not present

## 2022-05-31 DIAGNOSIS — E039 Hypothyroidism, unspecified: Secondary | ICD-10-CM | POA: Diagnosis not present

## 2022-05-31 DIAGNOSIS — Z96659 Presence of unspecified artificial knee joint: Secondary | ICD-10-CM | POA: Diagnosis not present

## 2022-05-31 DIAGNOSIS — D649 Anemia, unspecified: Secondary | ICD-10-CM | POA: Diagnosis not present

## 2022-05-31 DIAGNOSIS — I82592 Chronic embolism and thrombosis of other specified deep vein of left lower extremity: Secondary | ICD-10-CM | POA: Diagnosis not present

## 2022-05-31 DIAGNOSIS — I129 Hypertensive chronic kidney disease with stage 1 through stage 4 chronic kidney disease, or unspecified chronic kidney disease: Secondary | ICD-10-CM | POA: Diagnosis not present

## 2022-05-31 DIAGNOSIS — R2681 Unsteadiness on feet: Secondary | ICD-10-CM | POA: Diagnosis not present

## 2022-05-31 DIAGNOSIS — K591 Functional diarrhea: Secondary | ICD-10-CM | POA: Diagnosis not present

## 2022-05-31 DIAGNOSIS — G8220 Paraplegia, unspecified: Secondary | ICD-10-CM | POA: Diagnosis not present

## 2022-05-31 DIAGNOSIS — E440 Moderate protein-calorie malnutrition: Secondary | ICD-10-CM | POA: Diagnosis not present

## 2022-05-31 DIAGNOSIS — R197 Diarrhea, unspecified: Secondary | ICD-10-CM | POA: Diagnosis not present

## 2022-06-01 DIAGNOSIS — R2681 Unsteadiness on feet: Secondary | ICD-10-CM | POA: Diagnosis not present

## 2022-06-01 DIAGNOSIS — D649 Anemia, unspecified: Secondary | ICD-10-CM | POA: Diagnosis not present

## 2022-06-01 DIAGNOSIS — Q059 Spina bifida, unspecified: Secondary | ICD-10-CM | POA: Diagnosis not present

## 2022-06-01 DIAGNOSIS — M109 Gout, unspecified: Secondary | ICD-10-CM | POA: Diagnosis not present

## 2022-06-01 DIAGNOSIS — E876 Hypokalemia: Secondary | ICD-10-CM | POA: Diagnosis not present

## 2022-06-01 DIAGNOSIS — D519 Vitamin B12 deficiency anemia, unspecified: Secondary | ICD-10-CM | POA: Diagnosis not present

## 2022-06-01 DIAGNOSIS — E440 Moderate protein-calorie malnutrition: Secondary | ICD-10-CM | POA: Diagnosis not present

## 2022-06-01 DIAGNOSIS — I129 Hypertensive chronic kidney disease with stage 1 through stage 4 chronic kidney disease, or unspecified chronic kidney disease: Secondary | ICD-10-CM | POA: Diagnosis not present

## 2022-06-01 DIAGNOSIS — N390 Urinary tract infection, site not specified: Secondary | ICD-10-CM | POA: Diagnosis not present

## 2022-06-01 DIAGNOSIS — G8220 Paraplegia, unspecified: Secondary | ICD-10-CM | POA: Diagnosis not present

## 2022-06-01 DIAGNOSIS — E039 Hypothyroidism, unspecified: Secondary | ICD-10-CM | POA: Diagnosis not present

## 2022-06-01 DIAGNOSIS — M47816 Spondylosis without myelopathy or radiculopathy, lumbar region: Secondary | ICD-10-CM | POA: Diagnosis not present

## 2022-06-01 DIAGNOSIS — M6281 Muscle weakness (generalized): Secondary | ICD-10-CM | POA: Diagnosis not present

## 2022-06-01 DIAGNOSIS — R54 Age-related physical debility: Secondary | ICD-10-CM | POA: Diagnosis not present

## 2022-06-01 DIAGNOSIS — T8459XD Infection and inflammatory reaction due to other internal joint prosthesis, subsequent encounter: Secondary | ICD-10-CM | POA: Diagnosis not present

## 2022-06-01 DIAGNOSIS — K591 Functional diarrhea: Secondary | ICD-10-CM | POA: Diagnosis not present

## 2022-06-02 DIAGNOSIS — E440 Moderate protein-calorie malnutrition: Secondary | ICD-10-CM | POA: Diagnosis not present

## 2022-06-02 DIAGNOSIS — D649 Anemia, unspecified: Secondary | ICD-10-CM | POA: Diagnosis not present

## 2022-06-02 DIAGNOSIS — E876 Hypokalemia: Secondary | ICD-10-CM | POA: Diagnosis not present

## 2022-06-02 DIAGNOSIS — E039 Hypothyroidism, unspecified: Secondary | ICD-10-CM | POA: Diagnosis not present

## 2022-06-02 DIAGNOSIS — N390 Urinary tract infection, site not specified: Secondary | ICD-10-CM | POA: Diagnosis not present

## 2022-06-02 DIAGNOSIS — M109 Gout, unspecified: Secondary | ICD-10-CM | POA: Diagnosis not present

## 2022-06-02 DIAGNOSIS — G8220 Paraplegia, unspecified: Secondary | ICD-10-CM | POA: Diagnosis not present

## 2022-06-02 DIAGNOSIS — M47816 Spondylosis without myelopathy or radiculopathy, lumbar region: Secondary | ICD-10-CM | POA: Diagnosis not present

## 2022-06-02 DIAGNOSIS — T8459XD Infection and inflammatory reaction due to other internal joint prosthesis, subsequent encounter: Secondary | ICD-10-CM | POA: Diagnosis not present

## 2022-06-02 DIAGNOSIS — Q059 Spina bifida, unspecified: Secondary | ICD-10-CM | POA: Diagnosis not present

## 2022-06-02 DIAGNOSIS — K591 Functional diarrhea: Secondary | ICD-10-CM | POA: Diagnosis not present

## 2022-06-02 DIAGNOSIS — R54 Age-related physical debility: Secondary | ICD-10-CM | POA: Diagnosis not present

## 2022-06-02 DIAGNOSIS — M6281 Muscle weakness (generalized): Secondary | ICD-10-CM | POA: Diagnosis not present

## 2022-06-02 DIAGNOSIS — D519 Vitamin B12 deficiency anemia, unspecified: Secondary | ICD-10-CM | POA: Diagnosis not present

## 2022-06-02 DIAGNOSIS — R2681 Unsteadiness on feet: Secondary | ICD-10-CM | POA: Diagnosis not present

## 2022-06-02 DIAGNOSIS — I129 Hypertensive chronic kidney disease with stage 1 through stage 4 chronic kidney disease, or unspecified chronic kidney disease: Secondary | ICD-10-CM | POA: Diagnosis not present

## 2022-06-03 DIAGNOSIS — M109 Gout, unspecified: Secondary | ICD-10-CM | POA: Diagnosis not present

## 2022-06-03 DIAGNOSIS — G8220 Paraplegia, unspecified: Secondary | ICD-10-CM | POA: Diagnosis not present

## 2022-06-03 DIAGNOSIS — E440 Moderate protein-calorie malnutrition: Secondary | ICD-10-CM | POA: Diagnosis not present

## 2022-06-03 DIAGNOSIS — M47816 Spondylosis without myelopathy or radiculopathy, lumbar region: Secondary | ICD-10-CM | POA: Diagnosis not present

## 2022-06-03 DIAGNOSIS — Q059 Spina bifida, unspecified: Secondary | ICD-10-CM | POA: Diagnosis not present

## 2022-06-03 DIAGNOSIS — K591 Functional diarrhea: Secondary | ICD-10-CM | POA: Diagnosis not present

## 2022-06-03 DIAGNOSIS — T8459XD Infection and inflammatory reaction due to other internal joint prosthesis, subsequent encounter: Secondary | ICD-10-CM | POA: Diagnosis not present

## 2022-06-03 DIAGNOSIS — I129 Hypertensive chronic kidney disease with stage 1 through stage 4 chronic kidney disease, or unspecified chronic kidney disease: Secondary | ICD-10-CM | POA: Diagnosis not present

## 2022-06-03 DIAGNOSIS — N390 Urinary tract infection, site not specified: Secondary | ICD-10-CM | POA: Diagnosis not present

## 2022-06-03 DIAGNOSIS — M6281 Muscle weakness (generalized): Secondary | ICD-10-CM | POA: Diagnosis not present

## 2022-06-03 DIAGNOSIS — D519 Vitamin B12 deficiency anemia, unspecified: Secondary | ICD-10-CM | POA: Diagnosis not present

## 2022-06-03 DIAGNOSIS — E876 Hypokalemia: Secondary | ICD-10-CM | POA: Diagnosis not present

## 2022-06-03 DIAGNOSIS — R2681 Unsteadiness on feet: Secondary | ICD-10-CM | POA: Diagnosis not present

## 2022-06-03 DIAGNOSIS — D649 Anemia, unspecified: Secondary | ICD-10-CM | POA: Diagnosis not present

## 2022-06-03 DIAGNOSIS — R54 Age-related physical debility: Secondary | ICD-10-CM | POA: Diagnosis not present

## 2022-06-03 DIAGNOSIS — E039 Hypothyroidism, unspecified: Secondary | ICD-10-CM | POA: Diagnosis not present

## 2022-06-04 DIAGNOSIS — Q059 Spina bifida, unspecified: Secondary | ICD-10-CM | POA: Diagnosis not present

## 2022-06-04 DIAGNOSIS — E440 Moderate protein-calorie malnutrition: Secondary | ICD-10-CM | POA: Diagnosis not present

## 2022-06-04 DIAGNOSIS — M47816 Spondylosis without myelopathy or radiculopathy, lumbar region: Secondary | ICD-10-CM | POA: Diagnosis not present

## 2022-06-04 DIAGNOSIS — M109 Gout, unspecified: Secondary | ICD-10-CM | POA: Diagnosis not present

## 2022-06-04 DIAGNOSIS — D649 Anemia, unspecified: Secondary | ICD-10-CM | POA: Diagnosis not present

## 2022-06-04 DIAGNOSIS — T8459XD Infection and inflammatory reaction due to other internal joint prosthesis, subsequent encounter: Secondary | ICD-10-CM | POA: Diagnosis not present

## 2022-06-04 DIAGNOSIS — K591 Functional diarrhea: Secondary | ICD-10-CM | POA: Diagnosis not present

## 2022-06-04 DIAGNOSIS — G8220 Paraplegia, unspecified: Secondary | ICD-10-CM | POA: Diagnosis not present

## 2022-06-04 DIAGNOSIS — R2681 Unsteadiness on feet: Secondary | ICD-10-CM | POA: Diagnosis not present

## 2022-06-04 DIAGNOSIS — M6281 Muscle weakness (generalized): Secondary | ICD-10-CM | POA: Diagnosis not present

## 2022-06-04 DIAGNOSIS — I129 Hypertensive chronic kidney disease with stage 1 through stage 4 chronic kidney disease, or unspecified chronic kidney disease: Secondary | ICD-10-CM | POA: Diagnosis not present

## 2022-06-04 DIAGNOSIS — E876 Hypokalemia: Secondary | ICD-10-CM | POA: Diagnosis not present

## 2022-06-04 DIAGNOSIS — E039 Hypothyroidism, unspecified: Secondary | ICD-10-CM | POA: Diagnosis not present

## 2022-06-04 DIAGNOSIS — R54 Age-related physical debility: Secondary | ICD-10-CM | POA: Diagnosis not present

## 2022-06-04 DIAGNOSIS — D519 Vitamin B12 deficiency anemia, unspecified: Secondary | ICD-10-CM | POA: Diagnosis not present

## 2022-06-04 DIAGNOSIS — N390 Urinary tract infection, site not specified: Secondary | ICD-10-CM | POA: Diagnosis not present

## 2022-06-08 DIAGNOSIS — M47816 Spondylosis without myelopathy or radiculopathy, lumbar region: Secondary | ICD-10-CM | POA: Diagnosis not present

## 2022-06-08 DIAGNOSIS — E039 Hypothyroidism, unspecified: Secondary | ICD-10-CM | POA: Diagnosis not present

## 2022-06-08 DIAGNOSIS — D649 Anemia, unspecified: Secondary | ICD-10-CM | POA: Diagnosis not present

## 2022-06-08 DIAGNOSIS — E440 Moderate protein-calorie malnutrition: Secondary | ICD-10-CM | POA: Diagnosis not present

## 2022-06-08 DIAGNOSIS — Q059 Spina bifida, unspecified: Secondary | ICD-10-CM | POA: Diagnosis not present

## 2022-06-08 DIAGNOSIS — E876 Hypokalemia: Secondary | ICD-10-CM | POA: Diagnosis not present

## 2022-06-08 DIAGNOSIS — R2681 Unsteadiness on feet: Secondary | ICD-10-CM | POA: Diagnosis not present

## 2022-06-08 DIAGNOSIS — K591 Functional diarrhea: Secondary | ICD-10-CM | POA: Diagnosis not present

## 2022-06-08 DIAGNOSIS — R54 Age-related physical debility: Secondary | ICD-10-CM | POA: Diagnosis not present

## 2022-06-08 DIAGNOSIS — M109 Gout, unspecified: Secondary | ICD-10-CM | POA: Diagnosis not present

## 2022-06-08 DIAGNOSIS — G8220 Paraplegia, unspecified: Secondary | ICD-10-CM | POA: Diagnosis not present

## 2022-06-08 DIAGNOSIS — D519 Vitamin B12 deficiency anemia, unspecified: Secondary | ICD-10-CM | POA: Diagnosis not present

## 2022-06-08 DIAGNOSIS — M6281 Muscle weakness (generalized): Secondary | ICD-10-CM | POA: Diagnosis not present

## 2022-06-08 DIAGNOSIS — N390 Urinary tract infection, site not specified: Secondary | ICD-10-CM | POA: Diagnosis not present

## 2022-06-08 DIAGNOSIS — I129 Hypertensive chronic kidney disease with stage 1 through stage 4 chronic kidney disease, or unspecified chronic kidney disease: Secondary | ICD-10-CM | POA: Diagnosis not present

## 2022-06-08 DIAGNOSIS — T8459XD Infection and inflammatory reaction due to other internal joint prosthesis, subsequent encounter: Secondary | ICD-10-CM | POA: Diagnosis not present

## 2022-06-09 DIAGNOSIS — D649 Anemia, unspecified: Secondary | ICD-10-CM | POA: Diagnosis not present

## 2022-06-09 DIAGNOSIS — I129 Hypertensive chronic kidney disease with stage 1 through stage 4 chronic kidney disease, or unspecified chronic kidney disease: Secondary | ICD-10-CM | POA: Diagnosis not present

## 2022-06-09 DIAGNOSIS — M109 Gout, unspecified: Secondary | ICD-10-CM | POA: Diagnosis not present

## 2022-06-09 DIAGNOSIS — N390 Urinary tract infection, site not specified: Secondary | ICD-10-CM | POA: Diagnosis not present

## 2022-06-09 DIAGNOSIS — E039 Hypothyroidism, unspecified: Secondary | ICD-10-CM | POA: Diagnosis not present

## 2022-06-09 DIAGNOSIS — R54 Age-related physical debility: Secondary | ICD-10-CM | POA: Diagnosis not present

## 2022-06-09 DIAGNOSIS — T8459XD Infection and inflammatory reaction due to other internal joint prosthesis, subsequent encounter: Secondary | ICD-10-CM | POA: Diagnosis not present

## 2022-06-09 DIAGNOSIS — M47816 Spondylosis without myelopathy or radiculopathy, lumbar region: Secondary | ICD-10-CM | POA: Diagnosis not present

## 2022-06-09 DIAGNOSIS — G8220 Paraplegia, unspecified: Secondary | ICD-10-CM | POA: Diagnosis not present

## 2022-06-09 DIAGNOSIS — R2681 Unsteadiness on feet: Secondary | ICD-10-CM | POA: Diagnosis not present

## 2022-06-09 DIAGNOSIS — Q059 Spina bifida, unspecified: Secondary | ICD-10-CM | POA: Diagnosis not present

## 2022-06-09 DIAGNOSIS — E440 Moderate protein-calorie malnutrition: Secondary | ICD-10-CM | POA: Diagnosis not present

## 2022-06-09 DIAGNOSIS — D519 Vitamin B12 deficiency anemia, unspecified: Secondary | ICD-10-CM | POA: Diagnosis not present

## 2022-06-09 DIAGNOSIS — M6281 Muscle weakness (generalized): Secondary | ICD-10-CM | POA: Diagnosis not present

## 2022-06-09 DIAGNOSIS — K591 Functional diarrhea: Secondary | ICD-10-CM | POA: Diagnosis not present

## 2022-06-09 DIAGNOSIS — E876 Hypokalemia: Secondary | ICD-10-CM | POA: Diagnosis not present

## 2022-06-10 DIAGNOSIS — M6281 Muscle weakness (generalized): Secondary | ICD-10-CM | POA: Diagnosis not present

## 2022-06-10 DIAGNOSIS — I129 Hypertensive chronic kidney disease with stage 1 through stage 4 chronic kidney disease, or unspecified chronic kidney disease: Secondary | ICD-10-CM | POA: Diagnosis not present

## 2022-06-10 DIAGNOSIS — D519 Vitamin B12 deficiency anemia, unspecified: Secondary | ICD-10-CM | POA: Diagnosis not present

## 2022-06-10 DIAGNOSIS — M109 Gout, unspecified: Secondary | ICD-10-CM | POA: Diagnosis not present

## 2022-06-10 DIAGNOSIS — R2681 Unsteadiness on feet: Secondary | ICD-10-CM | POA: Diagnosis not present

## 2022-06-10 DIAGNOSIS — E440 Moderate protein-calorie malnutrition: Secondary | ICD-10-CM | POA: Diagnosis not present

## 2022-06-10 DIAGNOSIS — K591 Functional diarrhea: Secondary | ICD-10-CM | POA: Diagnosis not present

## 2022-06-10 DIAGNOSIS — T8459XD Infection and inflammatory reaction due to other internal joint prosthesis, subsequent encounter: Secondary | ICD-10-CM | POA: Diagnosis not present

## 2022-06-10 DIAGNOSIS — M47816 Spondylosis without myelopathy or radiculopathy, lumbar region: Secondary | ICD-10-CM | POA: Diagnosis not present

## 2022-06-10 DIAGNOSIS — G8220 Paraplegia, unspecified: Secondary | ICD-10-CM | POA: Diagnosis not present

## 2022-06-10 DIAGNOSIS — E039 Hypothyroidism, unspecified: Secondary | ICD-10-CM | POA: Diagnosis not present

## 2022-06-10 DIAGNOSIS — R54 Age-related physical debility: Secondary | ICD-10-CM | POA: Diagnosis not present

## 2022-06-10 DIAGNOSIS — Q059 Spina bifida, unspecified: Secondary | ICD-10-CM | POA: Diagnosis not present

## 2022-06-10 DIAGNOSIS — N390 Urinary tract infection, site not specified: Secondary | ICD-10-CM | POA: Diagnosis not present

## 2022-06-10 DIAGNOSIS — D649 Anemia, unspecified: Secondary | ICD-10-CM | POA: Diagnosis not present

## 2022-06-10 DIAGNOSIS — E876 Hypokalemia: Secondary | ICD-10-CM | POA: Diagnosis not present

## 2022-06-11 DIAGNOSIS — I129 Hypertensive chronic kidney disease with stage 1 through stage 4 chronic kidney disease, or unspecified chronic kidney disease: Secondary | ICD-10-CM | POA: Diagnosis not present

## 2022-06-11 DIAGNOSIS — N390 Urinary tract infection, site not specified: Secondary | ICD-10-CM | POA: Diagnosis not present

## 2022-06-11 DIAGNOSIS — M109 Gout, unspecified: Secondary | ICD-10-CM | POA: Diagnosis not present

## 2022-06-11 DIAGNOSIS — E039 Hypothyroidism, unspecified: Secondary | ICD-10-CM | POA: Diagnosis not present

## 2022-06-11 DIAGNOSIS — Q059 Spina bifida, unspecified: Secondary | ICD-10-CM | POA: Diagnosis not present

## 2022-06-11 DIAGNOSIS — G8220 Paraplegia, unspecified: Secondary | ICD-10-CM | POA: Diagnosis not present

## 2022-06-11 DIAGNOSIS — K591 Functional diarrhea: Secondary | ICD-10-CM | POA: Diagnosis not present

## 2022-06-11 DIAGNOSIS — R54 Age-related physical debility: Secondary | ICD-10-CM | POA: Diagnosis not present

## 2022-06-11 DIAGNOSIS — M47816 Spondylosis without myelopathy or radiculopathy, lumbar region: Secondary | ICD-10-CM | POA: Diagnosis not present

## 2022-06-11 DIAGNOSIS — E876 Hypokalemia: Secondary | ICD-10-CM | POA: Diagnosis not present

## 2022-06-11 DIAGNOSIS — T8459XD Infection and inflammatory reaction due to other internal joint prosthesis, subsequent encounter: Secondary | ICD-10-CM | POA: Diagnosis not present

## 2022-06-11 DIAGNOSIS — M6281 Muscle weakness (generalized): Secondary | ICD-10-CM | POA: Diagnosis not present

## 2022-06-11 DIAGNOSIS — D649 Anemia, unspecified: Secondary | ICD-10-CM | POA: Diagnosis not present

## 2022-06-11 DIAGNOSIS — E440 Moderate protein-calorie malnutrition: Secondary | ICD-10-CM | POA: Diagnosis not present

## 2022-06-11 DIAGNOSIS — R2681 Unsteadiness on feet: Secondary | ICD-10-CM | POA: Diagnosis not present

## 2022-06-11 DIAGNOSIS — D519 Vitamin B12 deficiency anemia, unspecified: Secondary | ICD-10-CM | POA: Diagnosis not present

## 2022-06-14 DIAGNOSIS — E039 Hypothyroidism, unspecified: Secondary | ICD-10-CM | POA: Diagnosis not present

## 2022-06-14 DIAGNOSIS — R54 Age-related physical debility: Secondary | ICD-10-CM | POA: Diagnosis not present

## 2022-06-14 DIAGNOSIS — R2681 Unsteadiness on feet: Secondary | ICD-10-CM | POA: Diagnosis not present

## 2022-06-14 DIAGNOSIS — D649 Anemia, unspecified: Secondary | ICD-10-CM | POA: Diagnosis not present

## 2022-06-14 DIAGNOSIS — D519 Vitamin B12 deficiency anemia, unspecified: Secondary | ICD-10-CM | POA: Diagnosis not present

## 2022-06-14 DIAGNOSIS — Q059 Spina bifida, unspecified: Secondary | ICD-10-CM | POA: Diagnosis not present

## 2022-06-14 DIAGNOSIS — E440 Moderate protein-calorie malnutrition: Secondary | ICD-10-CM | POA: Diagnosis not present

## 2022-06-14 DIAGNOSIS — N390 Urinary tract infection, site not specified: Secondary | ICD-10-CM | POA: Diagnosis not present

## 2022-06-14 DIAGNOSIS — K591 Functional diarrhea: Secondary | ICD-10-CM | POA: Diagnosis not present

## 2022-06-14 DIAGNOSIS — M47816 Spondylosis without myelopathy or radiculopathy, lumbar region: Secondary | ICD-10-CM | POA: Diagnosis not present

## 2022-06-14 DIAGNOSIS — M6281 Muscle weakness (generalized): Secondary | ICD-10-CM | POA: Diagnosis not present

## 2022-06-14 DIAGNOSIS — T8459XD Infection and inflammatory reaction due to other internal joint prosthesis, subsequent encounter: Secondary | ICD-10-CM | POA: Diagnosis not present

## 2022-06-14 DIAGNOSIS — I129 Hypertensive chronic kidney disease with stage 1 through stage 4 chronic kidney disease, or unspecified chronic kidney disease: Secondary | ICD-10-CM | POA: Diagnosis not present

## 2022-06-14 DIAGNOSIS — E876 Hypokalemia: Secondary | ICD-10-CM | POA: Diagnosis not present

## 2022-06-14 DIAGNOSIS — G8220 Paraplegia, unspecified: Secondary | ICD-10-CM | POA: Diagnosis not present

## 2022-06-14 DIAGNOSIS — M109 Gout, unspecified: Secondary | ICD-10-CM | POA: Diagnosis not present

## 2022-06-14 IMAGING — CT CT ABD-PELV W/O CM
2 of 4 series · 14 of 46 positions shown, 16 images · non-contrast
Comparison: Lumbar MRI 06/05/2018.

CLINICAL DATA: 78-year-old female with acute mid epigastric
abdominal pain. Renal insufficiency. EMR states history of
myelodysplastic syndrome.

EXAM:
CT ABDOMEN AND PELVIS WITHOUT CONTRAST
TECHNIQUE: Multidetector CT imaging of the abdomen and pelvis was performed
following the standard protocol without IV contrast.

[Series 2: axial st · axial · 0.89mm/px · z∈[-407,-47]mm · 11 of 88 slices shown, 13 images]
[im 8/88  soft-tissue]
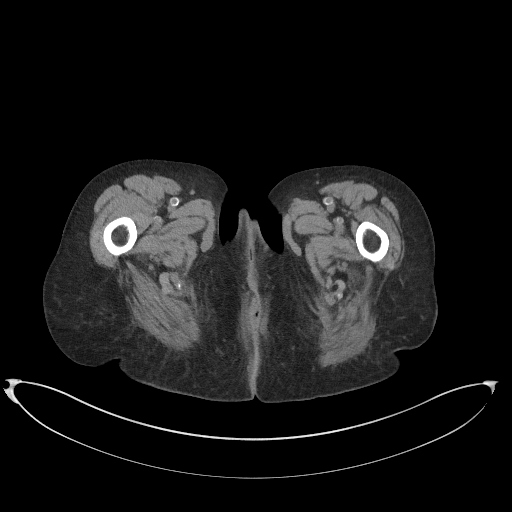
[im 8/88  bone]
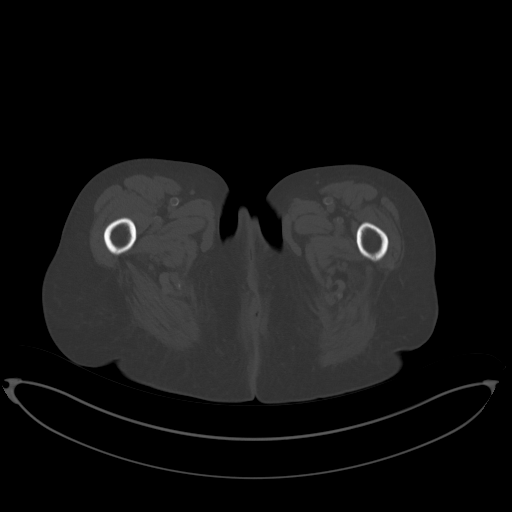
[im 15/88  soft-tissue]
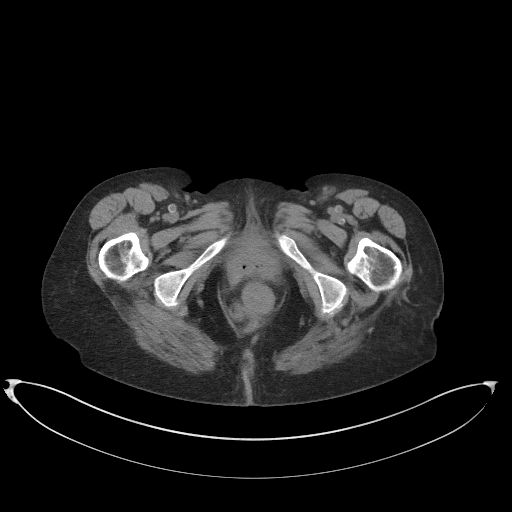
[im 22/88  soft-tissue]
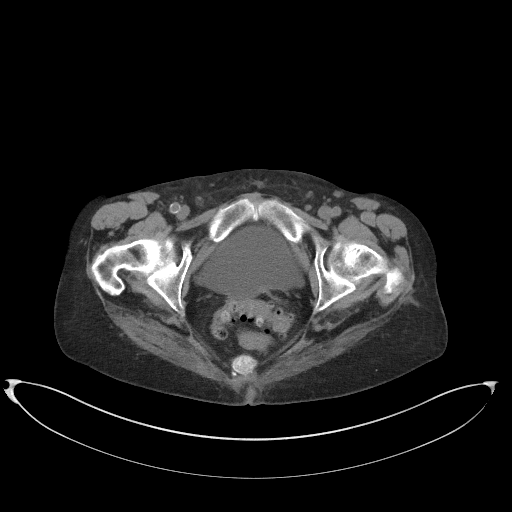
[im 30/88  soft-tissue]
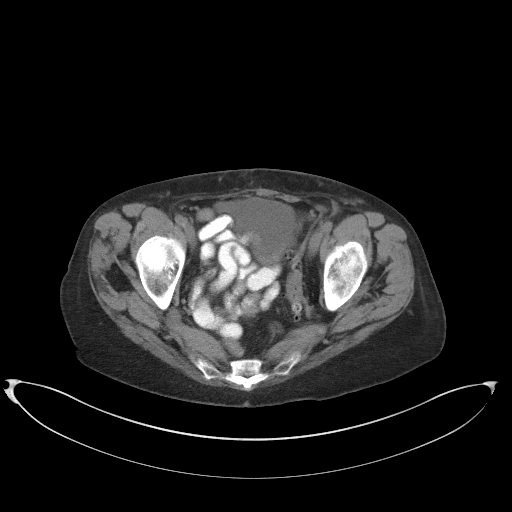
[im 37/88  soft-tissue]
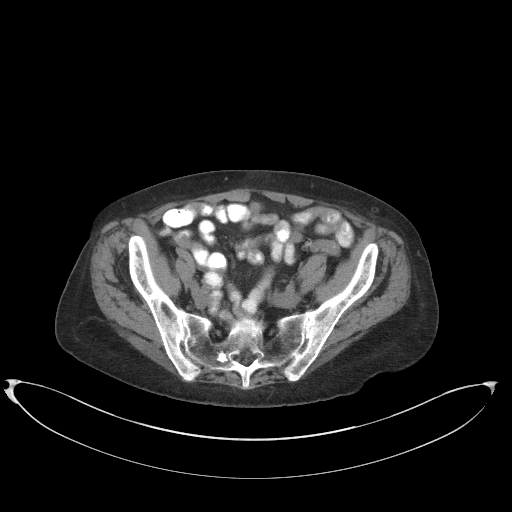
[im 44/88  soft-tissue]
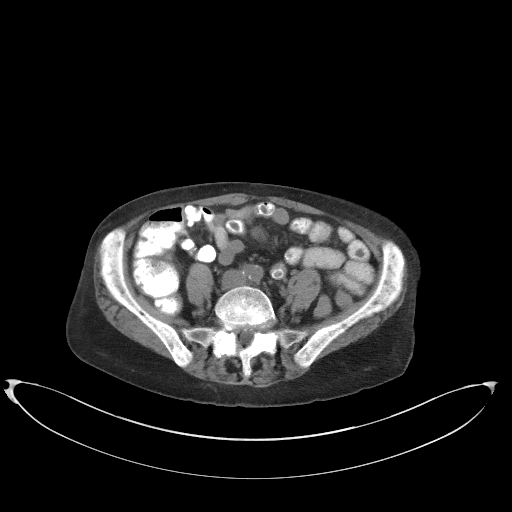
[im 51/88  soft-tissue]
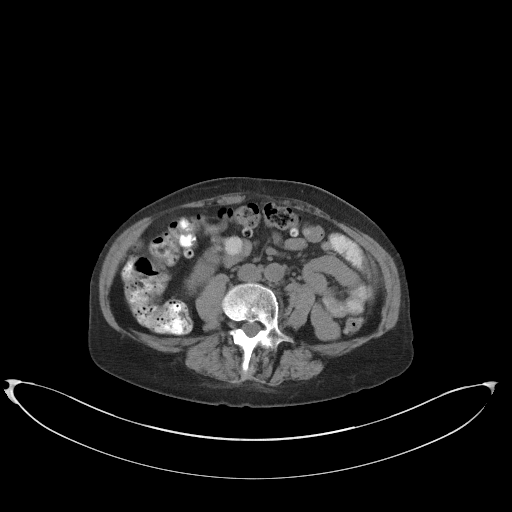
[im 59/88  soft-tissue]
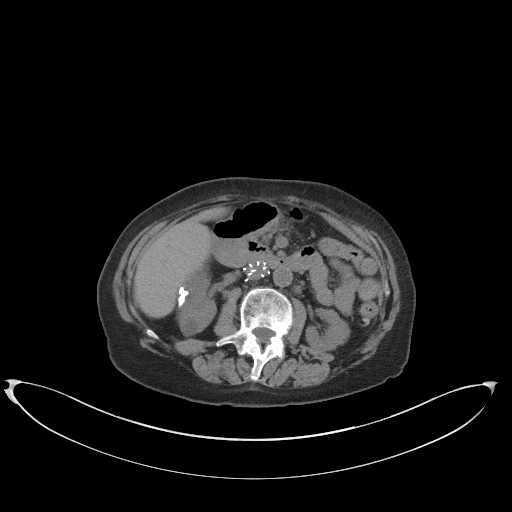
[im 66/88  soft-tissue]
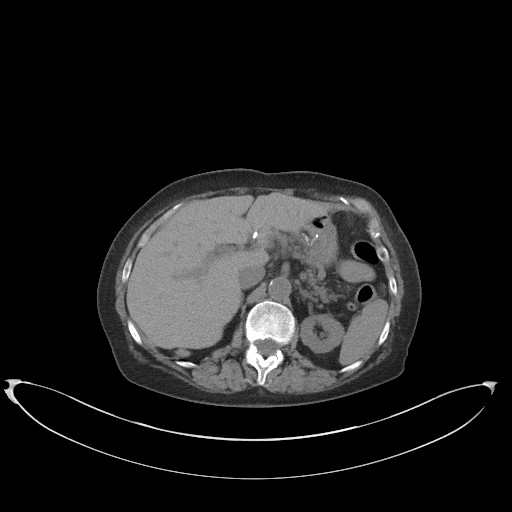
[im 66/88  bone]
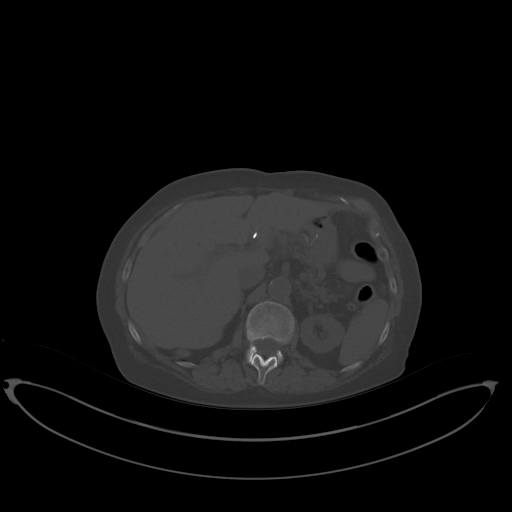
[im 73/88  soft-tissue]
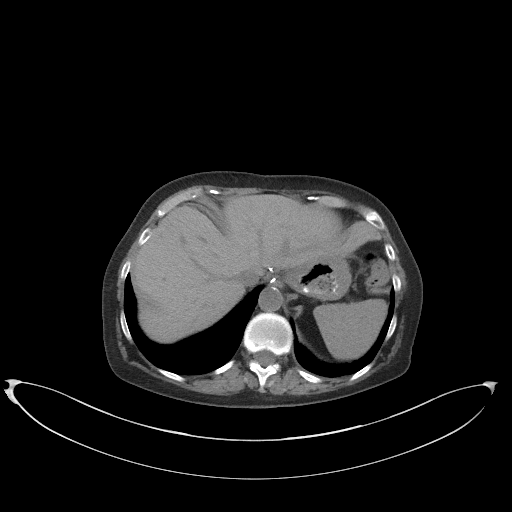
[im 80/88  soft-tissue]
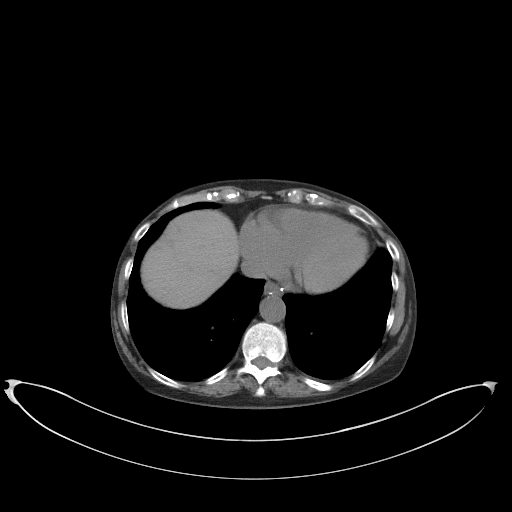

[Series 5: coronal st · coronal · 0.68mm/px · 3 of 75 slices shown]
[im 25/75  soft-tissue]
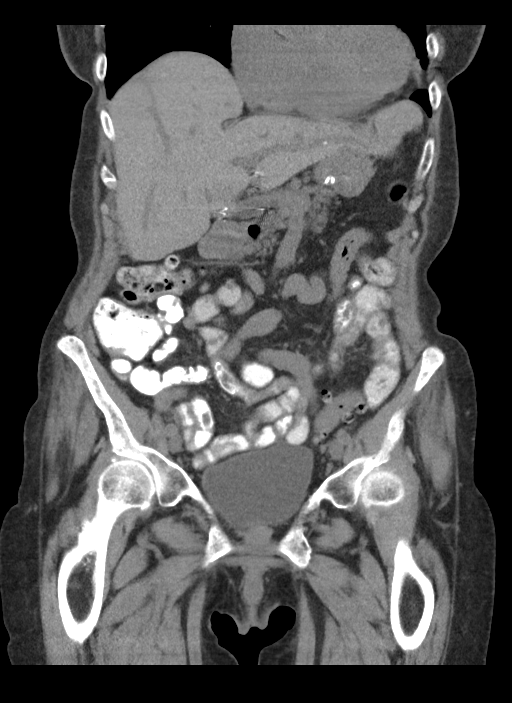
[im 33/75  soft-tissue]
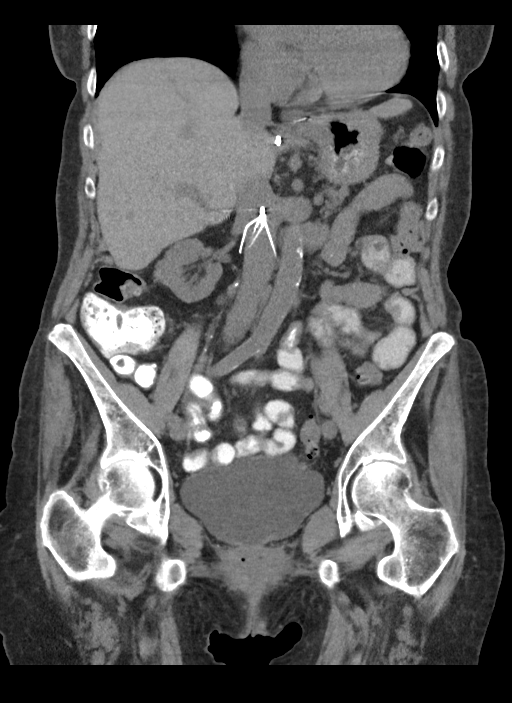
[im 42/75  soft-tissue]
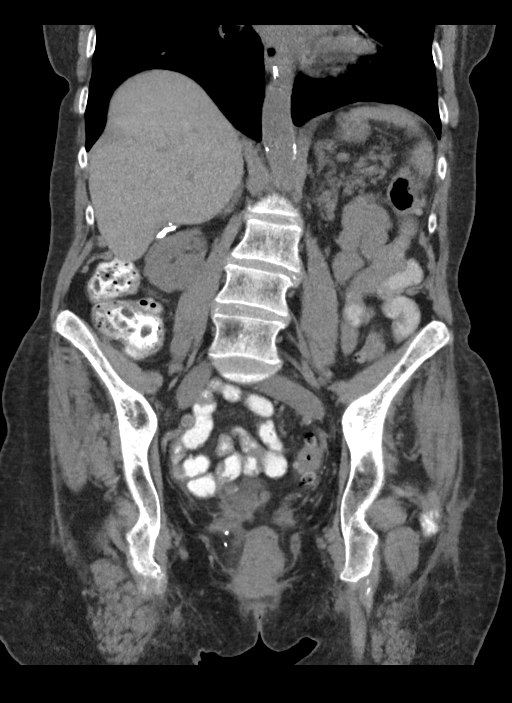

[14 of 46 positions shown; findings below may reference images not displayed]

FINDINGS: Lower chest: Borderline to mild cardiomegaly. No pericardial
effusion. Clear lung bases.

Hepatobiliary: Surgically absent gallbladder. Coarse benign
appearing calcification of the inferior right hepatic liver capsule
near the right kidney on series 2, image 29. This could be
postinflammatory or posttraumatic. Liver parenchyma elsewhere is
within normal limits.

Pancreas: Partially atrophied but otherwise negative.

Spleen: Negative.

Adrenals/Urinary Tract: Normal adrenal glands.

A degree of bilateral renal atrophy is suspected but otherwise
negative noncontrast appearance of the kidneys. No nephrolithiasis
or hydronephrosis. No perinephric stranding. Proximal ureters seem
decompressed.

Unremarkable urinary bladder.  Several pelvic phleboliths.

Stomach/Bowel: Oral contrast was administered, and has reached the
hepatic flexure.

Diverticulosis throughout decompressed and redundant sigmoid colon.
Similar diverticulosis throughout the descending colon, and
intermittently in the transverse colon. The right colon is
relatively spared. No active large bowel inflammation is identified.
The appendix is diminutive or absent.

There is also diverticulosis of the terminal ileum, best seen on
series 2, image 40. No associated small bowel inflammation. No
dilated small bowel.

Chronic postoperative changes to the gastroesophageal junction, and
also an appearance of previous distal gastrectomy with primary
reanastomosis of the duodenum (series 2, image 30). No regional
inflammation. No abdominal free air or free fluid.

Vascular/Lymphatic: IVC filter in place, with appearance of limbs
chronically extending beyond the confines of the IVC (series 2,
image 34). Calcified aortic atherosclerosis. Calcified femoral
artery atherosclerosis. Vascular patency is not evaluated in the
absence of IV contrast.

No lymphadenopathy.

Reproductive: Uterus is surgically absent. Ovaries are diminutive or
absent.

Other: No pelvic free fluid.

Musculoskeletal: Chronic mild levoconvex lumbar scoliosis.
Heterogeneous bone mineralization throughout the abdomen and pelvis,
but no destructive osseous lesion identified. Lumbar spine
degeneration appears stable.
IMPRESSION: 1. No acute or inflammatory process identified in the abdomen or
pelvis.

2. Chronic postoperative changes to the stomach. Diverticulosis of
both the distal small bowel and much of the colon, but no active
inflammation identified.

3. Heterogeneous bone mineralization, compatible with history of
myelodysplastic syndrome.

4. Surgically absent gallbladder and uterus. Small chronic
postinflammatory or posttraumatic appearing calcification of the
right hepatic lobe. IVC filter in place. Aortic Atherosclerosis
([id number]).

## 2022-06-15 DIAGNOSIS — Q059 Spina bifida, unspecified: Secondary | ICD-10-CM | POA: Diagnosis not present

## 2022-06-15 DIAGNOSIS — E440 Moderate protein-calorie malnutrition: Secondary | ICD-10-CM | POA: Diagnosis not present

## 2022-06-15 DIAGNOSIS — T8459XD Infection and inflammatory reaction due to other internal joint prosthesis, subsequent encounter: Secondary | ICD-10-CM | POA: Diagnosis not present

## 2022-06-15 DIAGNOSIS — M6281 Muscle weakness (generalized): Secondary | ICD-10-CM | POA: Diagnosis not present

## 2022-06-15 DIAGNOSIS — R54 Age-related physical debility: Secondary | ICD-10-CM | POA: Diagnosis not present

## 2022-06-15 DIAGNOSIS — E039 Hypothyroidism, unspecified: Secondary | ICD-10-CM | POA: Diagnosis not present

## 2022-06-15 DIAGNOSIS — D649 Anemia, unspecified: Secondary | ICD-10-CM | POA: Diagnosis not present

## 2022-06-15 DIAGNOSIS — D519 Vitamin B12 deficiency anemia, unspecified: Secondary | ICD-10-CM | POA: Diagnosis not present

## 2022-06-15 DIAGNOSIS — R2681 Unsteadiness on feet: Secondary | ICD-10-CM | POA: Diagnosis not present

## 2022-06-15 DIAGNOSIS — G8220 Paraplegia, unspecified: Secondary | ICD-10-CM | POA: Diagnosis not present

## 2022-06-15 DIAGNOSIS — M109 Gout, unspecified: Secondary | ICD-10-CM | POA: Diagnosis not present

## 2022-06-15 DIAGNOSIS — K591 Functional diarrhea: Secondary | ICD-10-CM | POA: Diagnosis not present

## 2022-06-15 DIAGNOSIS — I129 Hypertensive chronic kidney disease with stage 1 through stage 4 chronic kidney disease, or unspecified chronic kidney disease: Secondary | ICD-10-CM | POA: Diagnosis not present

## 2022-06-15 DIAGNOSIS — M47816 Spondylosis without myelopathy or radiculopathy, lumbar region: Secondary | ICD-10-CM | POA: Diagnosis not present

## 2022-06-15 DIAGNOSIS — N390 Urinary tract infection, site not specified: Secondary | ICD-10-CM | POA: Diagnosis not present

## 2022-06-15 DIAGNOSIS — E876 Hypokalemia: Secondary | ICD-10-CM | POA: Diagnosis not present

## 2022-06-16 DIAGNOSIS — E876 Hypokalemia: Secondary | ICD-10-CM | POA: Diagnosis not present

## 2022-06-16 DIAGNOSIS — T8459XD Infection and inflammatory reaction due to other internal joint prosthesis, subsequent encounter: Secondary | ICD-10-CM | POA: Diagnosis not present

## 2022-06-16 DIAGNOSIS — R54 Age-related physical debility: Secondary | ICD-10-CM | POA: Diagnosis not present

## 2022-06-16 DIAGNOSIS — R2681 Unsteadiness on feet: Secondary | ICD-10-CM | POA: Diagnosis not present

## 2022-06-16 DIAGNOSIS — I129 Hypertensive chronic kidney disease with stage 1 through stage 4 chronic kidney disease, or unspecified chronic kidney disease: Secondary | ICD-10-CM | POA: Diagnosis not present

## 2022-06-16 DIAGNOSIS — E039 Hypothyroidism, unspecified: Secondary | ICD-10-CM | POA: Diagnosis not present

## 2022-06-16 DIAGNOSIS — G8220 Paraplegia, unspecified: Secondary | ICD-10-CM | POA: Diagnosis not present

## 2022-06-16 DIAGNOSIS — K591 Functional diarrhea: Secondary | ICD-10-CM | POA: Diagnosis not present

## 2022-06-16 DIAGNOSIS — D519 Vitamin B12 deficiency anemia, unspecified: Secondary | ICD-10-CM | POA: Diagnosis not present

## 2022-06-16 DIAGNOSIS — Q059 Spina bifida, unspecified: Secondary | ICD-10-CM | POA: Diagnosis not present

## 2022-06-16 DIAGNOSIS — D649 Anemia, unspecified: Secondary | ICD-10-CM | POA: Diagnosis not present

## 2022-06-16 DIAGNOSIS — M6281 Muscle weakness (generalized): Secondary | ICD-10-CM | POA: Diagnosis not present

## 2022-06-16 DIAGNOSIS — M47816 Spondylosis without myelopathy or radiculopathy, lumbar region: Secondary | ICD-10-CM | POA: Diagnosis not present

## 2022-06-16 DIAGNOSIS — E440 Moderate protein-calorie malnutrition: Secondary | ICD-10-CM | POA: Diagnosis not present

## 2022-06-16 DIAGNOSIS — N390 Urinary tract infection, site not specified: Secondary | ICD-10-CM | POA: Diagnosis not present

## 2022-06-16 DIAGNOSIS — M109 Gout, unspecified: Secondary | ICD-10-CM | POA: Diagnosis not present

## 2022-06-17 DIAGNOSIS — M47816 Spondylosis without myelopathy or radiculopathy, lumbar region: Secondary | ICD-10-CM | POA: Diagnosis not present

## 2022-06-17 DIAGNOSIS — M109 Gout, unspecified: Secondary | ICD-10-CM | POA: Diagnosis not present

## 2022-06-17 DIAGNOSIS — D519 Vitamin B12 deficiency anemia, unspecified: Secondary | ICD-10-CM | POA: Diagnosis not present

## 2022-06-17 DIAGNOSIS — D649 Anemia, unspecified: Secondary | ICD-10-CM | POA: Diagnosis not present

## 2022-06-17 DIAGNOSIS — I129 Hypertensive chronic kidney disease with stage 1 through stage 4 chronic kidney disease, or unspecified chronic kidney disease: Secondary | ICD-10-CM | POA: Diagnosis not present

## 2022-06-17 DIAGNOSIS — M6281 Muscle weakness (generalized): Secondary | ICD-10-CM | POA: Diagnosis not present

## 2022-06-17 DIAGNOSIS — G8220 Paraplegia, unspecified: Secondary | ICD-10-CM | POA: Diagnosis not present

## 2022-06-17 DIAGNOSIS — Q059 Spina bifida, unspecified: Secondary | ICD-10-CM | POA: Diagnosis not present

## 2022-06-17 DIAGNOSIS — T8459XD Infection and inflammatory reaction due to other internal joint prosthesis, subsequent encounter: Secondary | ICD-10-CM | POA: Diagnosis not present

## 2022-06-17 DIAGNOSIS — E440 Moderate protein-calorie malnutrition: Secondary | ICD-10-CM | POA: Diagnosis not present

## 2022-06-17 DIAGNOSIS — E876 Hypokalemia: Secondary | ICD-10-CM | POA: Diagnosis not present

## 2022-06-17 DIAGNOSIS — N390 Urinary tract infection, site not specified: Secondary | ICD-10-CM | POA: Diagnosis not present

## 2022-06-17 DIAGNOSIS — R2681 Unsteadiness on feet: Secondary | ICD-10-CM | POA: Diagnosis not present

## 2022-06-17 DIAGNOSIS — R54 Age-related physical debility: Secondary | ICD-10-CM | POA: Diagnosis not present

## 2022-06-17 DIAGNOSIS — K591 Functional diarrhea: Secondary | ICD-10-CM | POA: Diagnosis not present

## 2022-06-17 DIAGNOSIS — E039 Hypothyroidism, unspecified: Secondary | ICD-10-CM | POA: Diagnosis not present

## 2022-06-19 DIAGNOSIS — Z299 Encounter for prophylactic measures, unspecified: Secondary | ICD-10-CM | POA: Diagnosis not present

## 2022-06-24 DIAGNOSIS — B351 Tinea unguium: Secondary | ICD-10-CM | POA: Diagnosis not present

## 2022-06-24 DIAGNOSIS — E1159 Type 2 diabetes mellitus with other circulatory complications: Secondary | ICD-10-CM | POA: Diagnosis not present

## 2022-06-25 DIAGNOSIS — T8454XD Infection and inflammatory reaction due to internal left knee prosthesis, subsequent encounter: Secondary | ICD-10-CM | POA: Diagnosis not present

## 2022-06-26 DIAGNOSIS — N189 Chronic kidney disease, unspecified: Secondary | ICD-10-CM | POA: Diagnosis not present

## 2022-06-26 DIAGNOSIS — G459 Transient cerebral ischemic attack, unspecified: Secondary | ICD-10-CM | POA: Diagnosis not present

## 2022-06-26 DIAGNOSIS — I10 Essential (primary) hypertension: Secondary | ICD-10-CM | POA: Diagnosis not present

## 2022-06-26 DIAGNOSIS — I4891 Unspecified atrial fibrillation: Secondary | ICD-10-CM | POA: Diagnosis not present

## 2022-06-29 DIAGNOSIS — Z23 Encounter for immunization: Secondary | ICD-10-CM | POA: Diagnosis not present

## 2022-07-03 DIAGNOSIS — N189 Chronic kidney disease, unspecified: Secondary | ICD-10-CM | POA: Diagnosis not present

## 2022-07-03 DIAGNOSIS — I10 Essential (primary) hypertension: Secondary | ICD-10-CM | POA: Diagnosis not present

## 2022-07-03 DIAGNOSIS — G459 Transient cerebral ischemic attack, unspecified: Secondary | ICD-10-CM | POA: Diagnosis not present

## 2022-07-03 DIAGNOSIS — I4891 Unspecified atrial fibrillation: Secondary | ICD-10-CM | POA: Diagnosis not present

## 2022-07-26 DIAGNOSIS — Z96652 Presence of left artificial knee joint: Secondary | ICD-10-CM | POA: Diagnosis not present

## 2022-07-26 DIAGNOSIS — Z471 Aftercare following joint replacement surgery: Secondary | ICD-10-CM | POA: Diagnosis not present

## 2022-07-26 DIAGNOSIS — T8484XD Pain due to internal orthopedic prosthetic devices, implants and grafts, subsequent encounter: Secondary | ICD-10-CM | POA: Diagnosis not present

## 2022-07-26 DIAGNOSIS — S83012D Lateral subluxation of left patella, subsequent encounter: Secondary | ICD-10-CM | POA: Diagnosis not present

## 2022-07-26 DIAGNOSIS — I999 Unspecified disorder of circulatory system: Secondary | ICD-10-CM | POA: Diagnosis not present

## 2022-07-26 DIAGNOSIS — T8454XD Infection and inflammatory reaction due to internal left knee prosthesis, subsequent encounter: Secondary | ICD-10-CM | POA: Diagnosis not present

## 2022-07-26 DIAGNOSIS — M85862 Other specified disorders of bone density and structure, left lower leg: Secondary | ICD-10-CM | POA: Diagnosis not present

## 2022-08-03 DIAGNOSIS — I4891 Unspecified atrial fibrillation: Secondary | ICD-10-CM | POA: Diagnosis not present

## 2022-08-03 DIAGNOSIS — G459 Transient cerebral ischemic attack, unspecified: Secondary | ICD-10-CM | POA: Diagnosis not present

## 2022-08-03 DIAGNOSIS — N189 Chronic kidney disease, unspecified: Secondary | ICD-10-CM | POA: Diagnosis not present

## 2022-08-03 DIAGNOSIS — I10 Essential (primary) hypertension: Secondary | ICD-10-CM | POA: Diagnosis not present

## 2022-08-05 DIAGNOSIS — C4490 Unspecified malignant neoplasm of skin, unspecified: Secondary | ICD-10-CM | POA: Diagnosis not present

## 2022-08-09 DIAGNOSIS — N390 Urinary tract infection, site not specified: Secondary | ICD-10-CM | POA: Diagnosis not present

## 2022-08-11 DIAGNOSIS — N390 Urinary tract infection, site not specified: Secondary | ICD-10-CM | POA: Diagnosis not present

## 2022-08-26 DIAGNOSIS — G894 Chronic pain syndrome: Secondary | ICD-10-CM | POA: Diagnosis not present

## 2022-08-26 DIAGNOSIS — R718 Other abnormality of red blood cells: Secondary | ICD-10-CM | POA: Diagnosis not present

## 2022-08-26 DIAGNOSIS — R569 Unspecified convulsions: Secondary | ICD-10-CM | POA: Diagnosis not present

## 2022-08-26 DIAGNOSIS — R2681 Unsteadiness on feet: Secondary | ICD-10-CM | POA: Diagnosis not present

## 2022-08-26 DIAGNOSIS — D62 Acute posthemorrhagic anemia: Secondary | ICD-10-CM | POA: Diagnosis not present

## 2022-08-26 DIAGNOSIS — I6930 Unspecified sequelae of cerebral infarction: Secondary | ICD-10-CM | POA: Diagnosis not present

## 2022-08-26 DIAGNOSIS — D519 Vitamin B12 deficiency anemia, unspecified: Secondary | ICD-10-CM | POA: Diagnosis not present

## 2022-08-26 DIAGNOSIS — R799 Abnormal finding of blood chemistry, unspecified: Secondary | ICD-10-CM | POA: Diagnosis not present

## 2022-08-26 DIAGNOSIS — I129 Hypertensive chronic kidney disease with stage 1 through stage 4 chronic kidney disease, or unspecified chronic kidney disease: Secondary | ICD-10-CM | POA: Diagnosis not present

## 2022-08-26 DIAGNOSIS — D582 Other hemoglobinopathies: Secondary | ICD-10-CM | POA: Diagnosis not present

## 2022-08-26 DIAGNOSIS — T8459XD Infection and inflammatory reaction due to other internal joint prosthesis, subsequent encounter: Secondary | ICD-10-CM | POA: Diagnosis not present

## 2022-08-26 DIAGNOSIS — F32A Depression, unspecified: Secondary | ICD-10-CM | POA: Diagnosis not present

## 2022-08-26 DIAGNOSIS — N183 Chronic kidney disease, stage 3 unspecified: Secondary | ICD-10-CM | POA: Diagnosis not present

## 2022-08-26 DIAGNOSIS — N390 Urinary tract infection, site not specified: Secondary | ICD-10-CM | POA: Diagnosis not present

## 2022-08-27 DIAGNOSIS — G894 Chronic pain syndrome: Secondary | ICD-10-CM | POA: Diagnosis not present

## 2022-08-27 DIAGNOSIS — R569 Unspecified convulsions: Secondary | ICD-10-CM | POA: Diagnosis not present

## 2022-08-27 DIAGNOSIS — R2681 Unsteadiness on feet: Secondary | ICD-10-CM | POA: Diagnosis not present

## 2022-08-27 DIAGNOSIS — I6930 Unspecified sequelae of cerebral infarction: Secondary | ICD-10-CM | POA: Diagnosis not present

## 2022-08-27 DIAGNOSIS — D519 Vitamin B12 deficiency anemia, unspecified: Secondary | ICD-10-CM | POA: Diagnosis not present

## 2022-08-27 DIAGNOSIS — T8459XD Infection and inflammatory reaction due to other internal joint prosthesis, subsequent encounter: Secondary | ICD-10-CM | POA: Diagnosis not present

## 2022-08-27 DIAGNOSIS — N183 Chronic kidney disease, stage 3 unspecified: Secondary | ICD-10-CM | POA: Diagnosis not present

## 2022-08-27 DIAGNOSIS — F32A Depression, unspecified: Secondary | ICD-10-CM | POA: Diagnosis not present

## 2022-08-27 DIAGNOSIS — I129 Hypertensive chronic kidney disease with stage 1 through stage 4 chronic kidney disease, or unspecified chronic kidney disease: Secondary | ICD-10-CM | POA: Diagnosis not present

## 2022-08-27 DIAGNOSIS — D62 Acute posthemorrhagic anemia: Secondary | ICD-10-CM | POA: Diagnosis not present

## 2022-08-28 DIAGNOSIS — D539 Nutritional anemia, unspecified: Secondary | ICD-10-CM | POA: Diagnosis not present

## 2022-08-28 DIAGNOSIS — I129 Hypertensive chronic kidney disease with stage 1 through stage 4 chronic kidney disease, or unspecified chronic kidney disease: Secondary | ICD-10-CM | POA: Diagnosis not present

## 2022-08-28 DIAGNOSIS — I4891 Unspecified atrial fibrillation: Secondary | ICD-10-CM | POA: Diagnosis not present

## 2022-08-28 DIAGNOSIS — D519 Vitamin B12 deficiency anemia, unspecified: Secondary | ICD-10-CM | POA: Diagnosis not present

## 2023-07-08 ENCOUNTER — Ambulatory Visit (INDEPENDENT_AMBULATORY_CARE_PROVIDER_SITE_OTHER): Payer: Medicare HMO | Admitting: Obstetrics and Gynecology

## 2023-07-08 ENCOUNTER — Encounter: Payer: Self-pay | Admitting: Obstetrics and Gynecology

## 2023-07-08 VITALS — BP 138/81 | HR 66 | Ht 61.0 in | Wt 108.0 lb

## 2023-07-08 DIAGNOSIS — N3281 Overactive bladder: Secondary | ICD-10-CM

## 2023-07-08 DIAGNOSIS — R300 Dysuria: Secondary | ICD-10-CM | POA: Diagnosis not present

## 2023-07-08 DIAGNOSIS — R159 Full incontinence of feces: Secondary | ICD-10-CM

## 2023-07-08 DIAGNOSIS — R350 Frequency of micturition: Secondary | ICD-10-CM | POA: Diagnosis not present

## 2023-07-08 DIAGNOSIS — R152 Fecal urgency: Secondary | ICD-10-CM

## 2023-07-08 LAB — POCT URINALYSIS DIPSTICK
Bilirubin, UA: NEGATIVE
Blood, UA: NEGATIVE
Glucose, UA: NEGATIVE
Ketones, UA: NEGATIVE
Leukocytes, UA: NEGATIVE
Nitrite, UA: NEGATIVE
Protein, UA: NEGATIVE
Spec Grav, UA: 1.01 (ref 1.010–1.025)
Urobilinogen, UA: 0.2 U/dL
pH, UA: 7 (ref 5.0–8.0)

## 2023-07-08 MED ORDER — VIBEGRON 75 MG PO TABS: 75.0000 mg | ORAL_TABLET | Freq: Every day | ORAL | 5 refills | Status: DC

## 2023-07-08 MED ORDER — METAMUCIL SMOOTH TEXTURE 58.6 % PO POWD: 1.0000 | Freq: Every day | ORAL | 12 refills | Status: AC
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
## 2023-07-10 ENCOUNTER — Encounter: Payer: Self-pay | Admitting: Obstetrics and Gynecology

## 2023-08-12 ENCOUNTER — Encounter: Payer: Self-pay | Admitting: Obstetrics and Gynecology

## 2023-08-12 ENCOUNTER — Telehealth (INDEPENDENT_AMBULATORY_CARE_PROVIDER_SITE_OTHER): Payer: Medicare HMO | Admitting: Obstetrics and Gynecology

## 2023-08-12 DIAGNOSIS — R159 Full incontinence of feces: Secondary | ICD-10-CM | POA: Diagnosis not present

## 2023-08-12 DIAGNOSIS — R350 Frequency of micturition: Secondary | ICD-10-CM | POA: Diagnosis not present

## 2023-08-12 DIAGNOSIS — N3281 Overactive bladder: Secondary | ICD-10-CM | POA: Diagnosis not present

## 2023-08-12 DIAGNOSIS — R109 Unspecified abdominal pain: Secondary | ICD-10-CM

## 2023-08-12 DIAGNOSIS — R152 Fecal urgency: Secondary | ICD-10-CM

## 2023-08-12 MED ORDER — VIBEGRON 75 MG PO TABS: 75.0000 mg | ORAL_TABLET | Freq: Every day | ORAL | 5 refills | Status: DC

## 2023-08-12 NOTE — Progress Notes (Signed)
[facility] Return Visit  Virtual Visit via Video Note  I connected with [name] on 08/12/23 at 10:40 AM EST by a video enabled telemedicine application and verified that I am speaking with the correct person using two identifiers.  Location: Patient: At her nursing home in [place] with her daughter and nursing staff present. Provider: in closed procedure room at office.    I discussed the limitations of evaluation and management by telemedicine and the availability of in person appointments. The patient expressed understanding and agreed to proceed.   SUBJECTIVE  History of Present Illness: Gloria Mckay is a 82 y.o. female seen in follow-up for OAB.   Patient reports she sees some improvement. She reports she is only getting up 1-2 times per night.   Past Medical History: Patient  has a past medical history of Arthritis, Cancer (HCC), Cataract, Hypertension, Myelodysplasia (myelodysplastic syndrome) (HCC), and Myelodysplastic syndrome (HCC).   Past Surgical History: She  has a past surgical history that includes Joint replacement; Stomach surgery; Appendectomy; Cholecystectomy; Abdominal hysterectomy; Knee surgery; and Leg amputation above knee.   Medications: She has a current medication list which includes the following prescription(s): acetaminophen, allopurinol, AMBULATORY NON FORMULARY MEDICATION, amlodipine, artificial tears, atorvastatin, calcium carb-cholecalciferol, carvedilol, clindamycin, cyanocobalamin, diclofenac sodium, escitalopram, esomeprazole, estradiol, glycerin-hypromellose-peg 400, hydralazine, fiberchoice, loperamide, melatonin, pregabalin, metamucil smooth texture, sodium bicarbonate, tizanidine, tizanidine, topiramate, vibegron, warfarin, warfarin, and warfarin.   Allergies: Patient is allergic to avocado, benadryl [diphenhydramine], cephalosporins, latex, other, valium [diazepam], zinacef [cefuroxime], methylpyrrolidone, norvasc  [amlodipine], penicillins, quinolones, roxicodone [oxycodone], stadol [butorphanol], sulfa antibiotics, tomato, voltaren [diclofenac], zofran [ondansetron], altace [ramipril], iodine i 131 tositumomab, lodine [etodolac], nexium [esomeprazole magnesium], protonix [pantoprazole], zestril [lisinopril], ace inhibitors, cipro [ciprofloxacin hcl], and codeine.   Social History: Patient  reports that she has never smoked. She has never used smokeless tobacco. She reports that she does not drink alcohol and does not use drugs.     OBJECTIVE     Physical Exam: Virtual visit:  Gen: No apparent distress, A&O x 3. Speaking in full sentences    ASSESSMENT AND PLAN    Gloria Mckay is a 82 y.o. with:  1. Flank pain   2. OAB (overactive bladder)   3. Urinary frequency   4. Incontinence of feces with fecal urgency     Patient reports that her flank pain on the left side has continued to bother her.  At her last visit I visually inspected the area and did not notice any bruising or concerning regions externally.  Due to her age I think that a ultrasound of the kidneys is reasonable.  Ultrasound renal ordered for her to evaluate for any stones, hydronephrosis, or mass. Patient endorses that her overactive bladder has gotten better with the Gemtesa 75 mg daily.  Would not recommend a different medication for her at this time due to her age.  Anticholinergics are not an appropriate choice as they increase her risk for brain fog, dementia, dry mouth, dry eyes, and constipation.  She would like to continue the Gemtesa 75 mg daily at this time so we will plan for this.  We will fax over a hard prescription to the facility as they use an in-house pharmacy. Patient reports that her urinary frequency has as good days and bad days but that the Leslye Peer is helping. Patient is not on protein supplementation at this time.  I discussed with Tosha one of the nurses at the facility that may be a plant-based option would be  better as she is very sensitive to dairy.  I discussed that I would send over a picture of one of the options for a nondairy Ensure that may be beneficial for patient if they are able to order this for her at the facility. Patient encouraged to call if she has any concerns with the medication or if she feels like things are not working.  Will plan for her to follow-up in about 3 months or sooner if needed.  Patient is agreeable to this plan of care and her daughter Gloria Mckay also in agreement and understanding.  I discussed the assessment and treatment plan with the patient. The patient was provided an opportunity to ask questions and all were answered. The patient agreed with the plan and demonstrated an understanding of the instructions.   The patient was advised to call back or seek an in-person evaluation if the symptoms worsen or if the condition fails to improve as anticipated.  I provided 12 minutes of non-face-to-face time during this encounter.   Selmer Dominion, NP

## 2023-09-14 ENCOUNTER — Ambulatory Visit (INDEPENDENT_AMBULATORY_CARE_PROVIDER_SITE_OTHER): Payer: Medicare HMO

## 2023-09-14 DIAGNOSIS — R109 Unspecified abdominal pain: Secondary | ICD-10-CM

## 2023-10-03 DIAGNOSIS — N390 Urinary tract infection, site not specified: Secondary | ICD-10-CM | POA: Diagnosis not present

## 2023-10-03 NOTE — Progress Notes (Signed)
Please call and inform patient that her [place] was completely normal. No signs of kidney stones, no signs of fluid backing onto kidneys. No concern for bladder abnormalities.

## 2023-10-05 ENCOUNTER — Telehealth: Payer: Self-pay

## 2023-10-05 NOTE — Telephone Encounter (Signed)
 Patient's daughter stated Gloria Mckay is having a lot of frequency. She has gotten checked by her facility for UTI and it was negative. So wants to know if she needs another appointment with you to maybe re-adjust the medication she's taking. Please advise

## 2023-10-05 NOTE — Progress Notes (Signed)
LMOVM for the patient to call back.

## 2023-10-10 NOTE — Telephone Encounter (Signed)
 Called and spoke to patient's daughter. She is on Gemtesa  75mg  daily. Other options would be to consider bladder botox which her daughter is unsure of. Encouraged her to track symptoms for a few days to determine how often she is having urgency and frequency to give me an idea.

## 2023-10-11 DIAGNOSIS — N3281 Overactive bladder: Secondary | ICD-10-CM | POA: Diagnosis not present

## 2023-10-11 DIAGNOSIS — R519 Headache, unspecified: Secondary | ICD-10-CM | POA: Diagnosis not present

## 2023-10-11 DIAGNOSIS — I10 Essential (primary) hypertension: Secondary | ICD-10-CM | POA: Diagnosis not present

## 2023-10-19 ENCOUNTER — Encounter: Payer: Self-pay | Admitting: Obstetrics and Gynecology

## 2023-11-02 DIAGNOSIS — R519 Headache, unspecified: Secondary | ICD-10-CM | POA: Diagnosis not present

## 2023-11-02 DIAGNOSIS — N3281 Overactive bladder: Secondary | ICD-10-CM | POA: Diagnosis not present

## 2023-11-23 DIAGNOSIS — Z7901 Long term (current) use of anticoagulants: Secondary | ICD-10-CM | POA: Diagnosis not present

## 2023-11-23 DIAGNOSIS — I482 Chronic atrial fibrillation, unspecified: Secondary | ICD-10-CM | POA: Diagnosis not present

## 2023-11-23 DIAGNOSIS — Z5181 Encounter for therapeutic drug level monitoring: Secondary | ICD-10-CM | POA: Diagnosis not present

## 2023-11-24 ENCOUNTER — Encounter: Payer: Self-pay | Admitting: Obstetrics

## 2023-11-24 ENCOUNTER — Ambulatory Visit: Payer: 59 | Admitting: Obstetrics and Gynecology

## 2023-11-24 ENCOUNTER — Ambulatory Visit (INDEPENDENT_AMBULATORY_CARE_PROVIDER_SITE_OTHER): Payer: 59 | Admitting: Obstetrics

## 2023-11-24 VITALS — BP 155/81 | HR 59

## 2023-11-24 DIAGNOSIS — N952 Postmenopausal atrophic vaginitis: Secondary | ICD-10-CM | POA: Insufficient documentation

## 2023-11-24 DIAGNOSIS — Z9889 Other specified postprocedural states: Secondary | ICD-10-CM | POA: Diagnosis not present

## 2023-11-24 DIAGNOSIS — R152 Fecal urgency: Secondary | ICD-10-CM | POA: Diagnosis not present

## 2023-11-24 DIAGNOSIS — R159 Full incontinence of feces: Secondary | ICD-10-CM | POA: Diagnosis not present

## 2023-11-24 DIAGNOSIS — N3281 Overactive bladder: Secondary | ICD-10-CM | POA: Diagnosis not present

## 2023-11-24 MED ORDER — ESTRADIOL 0.1 MG/GM VA CREA: TOPICAL_CREAM | VAGINAL | 2 refills | Status: AC

## 2023-11-24 MED ORDER — TROSPIUM CHLORIDE ER 60 MG PO CP24: 1.0000 | ORAL_CAPSULE | Freq: Every day | ORAL | 2 refills | Status: DC

## 2023-11-24 NOTE — Assessment & Plan Note (Signed)
-   history of prolapse surgery, unclear if it involved mesh use - discussed need for cystoscopy and urodynamics prior to surgical management - neurogenic disorder and need for UDS

## 2023-11-24 NOTE — Progress Notes (Signed)
 [facility] Return Visit  SUBJECTIVE  History of Present Illness: Gloria Mckay is a 83 y.o. female seen in follow-up for overactive bladder, dysuria, fecal incontinence. Plan at last visit was trial of Gemtesa.   Teodora Medici with some relief for 1 month, reported reduction of night time frequency. However discontinued due to headaches  Offered botox in the past for vocal cord spasms, declined due to discomfort with botox. Desires to review options  On warfarin with history of myelodysplastic syndrome and AKA 08/2022.  Difficult with prosthesis use due to urinary leakage  Reports history of reconstructive surgery by urology for pelvic organ prolapse, unclear if it involved mesh use  Past Medical History: Patient  has a past medical history of Arthritis, Cancer (HCC), Cataract, Hypertension, Myelodysplasia (myelodysplastic syndrome) (HCC), and Myelodysplastic syndrome (HCC).   Past Surgical History: She  has a past surgical history that includes Joint replacement; Stomach surgery; Appendectomy; Cholecystectomy; Abdominal hysterectomy; Knee surgery; and Leg amputation above knee.   Medications: She has a current medication list which includes the following prescription(s): acetaminophen, allopurinol, AMBULATORY NON FORMULARY MEDICATION, amlodipine, artificial tears, atorvastatin, calcium carb-cholecalciferol, carvedilol, clindamycin, cyanocobalamin, diclofenac sodium, escitalopram, esomeprazole, glycerin-hypromellose-peg 400, hydralazine, fiberchoice, loperamide, melatonin, pregabalin, metamucil smooth texture, sodium bicarbonate, tizanidine, tizanidine, topiramate, trospium chloride, warfarin, warfarin, warfarin, and [START ON 11/25/2023] estradiol.   Allergies: Patient is allergic to avocado, benadryl [diphenhydramine], cephalosporins, latex, other, valium [diazepam], zinacef [cefuroxime], methylpyrrolidone, norvasc [amlodipine], penicillins, quinolones, roxicodone [oxycodone],  stadol [butorphanol], sulfa antibiotics, tomato, voltaren [diclofenac], zofran [ondansetron], altace [ramipril], iodine i 131 tositumomab, lodine [etodolac], nexium [esomeprazole magnesium], protonix [pantoprazole], zestril [lisinopril], ace inhibitors, cipro [ciprofloxacin hcl], and codeine.   Social History: Patient  reports that she has never smoked. She has never used smokeless tobacco. She reports that she does not drink alcohol and does not use drugs.     OBJECTIVE     Physical Exam: Vitals:   11/24/23 1350  BP: (!) 155/81  Pulse: (!) 59   Gen: No apparent distress, A&O x 3.  Detailed Urogynecologic Evaluation:  Deferred. Prior exam showed:      No data to display             ASSESSMENT AND PLAN    Ms. Engebretson is a 83 y.o. with:  1. OAB (overactive bladder)   2. Vaginal atrophy   3. Incontinence of feces with fecal urgency   4. History of pelvic surgery     OAB (overactive bladder) Assessment & Plan: - gemtesa initially with some relief, discontinued due to headaches - We discussed the symptoms of overactive bladder (OAB), which include urinary urgency, urinary frequency, nocturia, with or without urge incontinence.  While we do not know the exact etiology of OAB, several treatment options exist. We discussed management including behavioral therapy (decreasing bladder irritants, urge suppression strategies, timed voids, bladder retraining), physical therapy, medication; for refractory cases posterior tibial nerve stimulation, sacral neuromodulation, and intravesical botulinum toxin injection.  For anticholinergic medications, we discussed the potential side effects of anticholinergics including dry eyes, dry mouth, constipation, cognitive impairment and urinary retention. For Beta-3 agonist medication, we discussed the potential side effect of elevated blood pressure which is more likely to occur in individuals with uncontrolled hypertension. - For refractory OAB we  reviewed the procedure for intravesical Botox injection with cystoscopy in the office and reviewed the risks, benefits and alternatives of treatment including but not limited to infection, need for self-catheterization and need for repeat therapy.  We discussed that there is  a 5-15% chance of needing to catheterize with Botox and that this usually resolves in a few months; however can persist for longer periods of time.  Typically Botox injections would need to be repeated every 3-12 months since this is not a permanent therapy.   We discussed the role of sacral neuromodulation and how it works. It requires a test phase, and documentation of bladder function via diary. After a successful test period, a permanent wire and generator are placed in the OR. The battery lasts 5 years on average and would need to be replaced surgically.  The goal of this therapy is at least a 50% improvement in symptoms. It is NOT realistic to expect a 100% cure.  We reviewed the fact that about 30% of patients fail the test phase and are not candidates for permanent generator placement.  We discussed the risk of infection and that the patient would not be able to get an MRI once the device is placed. There are two companies that provide this therapy: [employer] and [employer]. [employer]' product is new and is similar to [employer]'s, but has advantages of a smaller and rechargeable battery and being able to have an MRI with the implant. For all procedures, we discussed risks of bleeding, infection, damage to surrounding organs including bowel, bladder, blood vessels, ureters and nerves, need for further surgery, risk of postoperative urinary incontinence or retention with need to catheterize, recurrent prolapse, numbness and weakness at any body site, buttock pain, and the rarer risks of blood clot, heart attack, pneumonia, death.    We also discussed the role of percutaneous tibial nerve stimulation and how it works.  She understands it  requires 12 weekly visits for temporary neuromodulation of the sacral nerve roots via the tibial nerve and that she may then require continued tapered treatment.  She will return for the procedure. All questions were answered.  - patient reports anxiety regarding catheterization due to discomfort - encouraged to consider PTNS or SNM, discussed need to manage anticoagulation - desires trial of other medication, Rx trospium and discussed [facility] if cost prohibitive if allowed by facility  Orders: -     Trospium Chloride ER; Take 1 capsule (60 mg total) by mouth daily.  Dispense: 30 capsule; Refill: 2 -     Estradiol; Use 1g nightly for 2 weeks, followed by 1g 2-3 times a week  Dispense: 42.5 g; Refill: 2  Vaginal atrophy Assessment & Plan: For symptomatic vaginal atrophy options include lubrication with a water-based lubricant, personal hygiene measures and barrier protection against wetness, and estrogen replacement in the form of vaginal cream, vaginal tablets, or a time-released vaginal ring.   - Rx to resume low dose vaginal estrogen - discussed association with reduced bladder symptoms  Orders: -     Estradiol; Use 1g nightly for 2 weeks, followed by 1g 2-3 times a week  Dispense: 42.5 g; Refill: 2  Incontinence of feces with fecal urgency Assessment & Plan: - Treatment options include anti-diarrhea medication (loperamide/ Imodium OTC or prescription lomotil), fiber supplements, physical therapy, and possible sacral neuromodulation or surgery.   - reviewed sacral neuromodulation   History of pelvic surgery Assessment & Plan: - history of prolapse surgery, unclear if it involved mesh use - discussed need for cystoscopy and urodynamics prior to surgical management - neurogenic disorder and need for UDS   Time spent: I spent 55 minutes dedicated to the care of this patient on the date of this encounter to include pre-visit review  of records, face-to-face time with the  patient discussing overactive bladder, vaginal atrophy, fecal incontinence, history of pelvic surgery, and post visit documentation and ordering medication.    Loleta Chance, MD

## 2023-11-24 NOTE — Assessment & Plan Note (Addendum)
-   gemtesa initially with some relief, discontinued due to headaches - We discussed the symptoms of overactive bladder (OAB), which include urinary urgency, urinary frequency, nocturia, with or without urge incontinence.  While we do not know the exact etiology of OAB, several treatment options exist. We discussed management including behavioral therapy (decreasing bladder irritants, urge suppression strategies, timed voids, bladder retraining), physical therapy, medication; for refractory cases posterior tibial nerve stimulation, sacral neuromodulation, and intravesical botulinum toxin injection.  For anticholinergic medications, we discussed the potential side effects of anticholinergics including dry eyes, dry mouth, constipation, cognitive impairment and urinary retention. For Beta-3 agonist medication, we discussed the potential side effect of elevated blood pressure which is more likely to occur in individuals with uncontrolled hypertension. - For refractory OAB we reviewed the procedure for intravesical Botox injection with cystoscopy in the office and reviewed the risks, benefits and alternatives of treatment including but not limited to infection, need for self-catheterization and need for repeat therapy.  We discussed that there is a 5-15% chance of needing to catheterize with Botox and that this usually resolves in a few months; however can persist for longer periods of time.  Typically Botox injections would need to be repeated every 3-12 months since this is not a permanent therapy.   We discussed the role of sacral neuromodulation and how it works. It requires a test phase, and documentation of bladder function via diary. After a successful test period, a permanent wire and generator are placed in the OR. The battery lasts 5 years on average and would need to be replaced surgically.  The goal of this therapy is at least a 50% improvement in symptoms. It is NOT realistic to expect a 100% cure.  We  reviewed the fact that about 30% of patients fail the test phase and are not candidates for permanent generator placement.  We discussed the risk of infection and that the patient would not be able to get an MRI once the device is placed. There are two companies that provide this therapy: [employer] and [employer]. [employer]' product is new and is similar to [employer]'s, but has advantages of a smaller and rechargeable battery and being able to have an MRI with the implant. For all procedures, we discussed risks of bleeding, infection, damage to surrounding organs including bowel, bladder, blood vessels, ureters and nerves, need for further surgery, risk of postoperative urinary incontinence or retention with need to catheterize, recurrent prolapse, numbness and weakness at any body site, buttock pain, and the rarer risks of blood clot, heart attack, pneumonia, death.    We also discussed the role of percutaneous tibial nerve stimulation and how it works.  She understands it requires 12 weekly visits for temporary neuromodulation of the sacral nerve roots via the tibial nerve and that she may then require continued tapered treatment.  She will return for the procedure. All questions were answered.  - patient reports anxiety regarding catheterization due to discomfort - encouraged to consider PTNS or SNM, discussed need to manage anticoagulation - desires trial of other medication, Rx trospium and discussed [facility] if cost prohibitive if allowed by facility

## 2023-11-24 NOTE — Patient Instructions (Signed)
 For vaginal atrophy (thinning of the vaginal tissue that can cause dryness and burning) and UTI prevention we discussed estrogen replacement in the form of vaginal cream.   Start vaginal estrogen therapy nightly for two weeks then 2 times weekly at night. This can be placed with your finger or an applicator inside the vagina and around the urethra.  Please let us know if the prescription is too expensive and we can look for alternative options.   Is vaginal estrogen therapy safe for me? Vaginal estrogen preparations act on the vaginal skin, and only a very tiny amount is absorbed into the bloodstream (0.01%).  They work in a similar way to hand or face cream.  There is minimal absorption and they are therefore perfectly safe. If you have had breast cancer and have persistent troublesome symptoms which aren't settling with vaginal moisturisers and lubricants, local estrogen treatment may be a possibility, but consultation with your oncologist should take place first.   We discussed the symptoms of overactive bladder (OAB), which include urinary urgency, urinary frequency, night-time urination, with or without urge incontinence.  We discussed management including behavioral therapy (decreasing bladder irritants by following a bladder diet, urge suppression strategies, timed voids, bladder retraining), physical therapy, medication; and for refractory cases posterior tibial nerve stimulation, sacral neuromodulation, and intravesical botulinum toxin injection.   For anticholinergic medications, we discussed the potential side effects of anticholinergics including dry eyes, dry mouth, constipation, rare risks of cognitive impairment and urinary retention. You were given a prescription for Trospium.  It can take a month to start working so give it time, but if you have bothersome side effects call sooner and we can try a different medication.  Call us if you have trouble filling the prescription or if it's not  covered by your insurance.  Follow-up in 6-8 weeks to see how your symptoms are responding to treatment.   For refractory OAB we reviewed the procedure for intravesical Botox injection with cystoscopy in the office and reviewed the risks, benefits and alternatives of treatment including but not limited to infection, need for self-catheterization and need for repeat therapy.  We discussed that there is a 5-15% chance of needing to catheterize with Botox and that this usually resolves in a few months; however can persist for longer periods of time.  Typically Botox injections would need to be repeated every 3-12 months since this is not a permanent therapy.   We discussed the role of sacral neuromodulation and how it works. It requires a test phase, and documentation of bladder function via diary. After a successful test period, a permanent wire and generator are placed in the OR. The battery lasts 5 years on average and would need to be replaced surgically.  The goal of this therapy is at least a 50% improvement in symptoms. It is NOT realistic to expect a 100% cure.  We reviewed the fact that about 30% of patients fail the test phase and are not candidates for permanent generator placement.  We discussed the risk of infection and that the patient would not be able to get an MRI once the device is placed. There are two companies that provide this therapy: [employer] and [employer]. [employer]' product is new and is similar to [employer]'s, but has advantages of a smaller and rechargeable battery and being able to have an MRI with the implant. For all procedures, we discussed risks of bleeding, infection, damage to surrounding organs including bowel, bladder, blood vessels, ureters and nerves, need  for further surgery, risk of postoperative urinary incontinence or retention with need to catheterize, recurrent prolapse, numbness and weakness at any body site, buttock pain, and the rarer risks of blood clot, heart attack,  pneumonia, death.    We also discussed the role of percutaneous tibial nerve stimulation and how it works.  She understands it requires 12 weekly visits for temporary neuromodulation of the sacral nerve roots via the tibial nerve and that she may then require continued tapered treatment.  She will return for the procedure. All questions were answered.

## 2023-11-24 NOTE — Assessment & Plan Note (Signed)
 For symptomatic vaginal atrophy options include lubrication with a water-based lubricant, personal hygiene measures and barrier protection against wetness, and estrogen replacement in the form of vaginal cream, vaginal tablets, or a time-released vaginal ring.   - Rx to resume low dose vaginal estrogen - discussed association with reduced bladder symptoms

## 2023-11-24 NOTE — Assessment & Plan Note (Addendum)
-   Treatment options include anti-diarrhea medication (loperamide/ Imodium OTC or prescription lomotil), fiber supplements, physical therapy, and possible sacral neuromodulation or surgery.   - reviewed sacral neuromodulation

## 2023-11-25 DIAGNOSIS — N3281 Overactive bladder: Secondary | ICD-10-CM | POA: Diagnosis not present

## 2023-11-25 DIAGNOSIS — I10 Essential (primary) hypertension: Secondary | ICD-10-CM | POA: Diagnosis not present

## 2023-11-25 DIAGNOSIS — M6530 Trigger finger, unspecified finger: Secondary | ICD-10-CM | POA: Diagnosis not present

## 2023-11-25 DIAGNOSIS — I482 Chronic atrial fibrillation, unspecified: Secondary | ICD-10-CM | POA: Diagnosis not present

## 2023-11-25 DIAGNOSIS — N184 Chronic kidney disease, stage 4 (severe): Secondary | ICD-10-CM | POA: Diagnosis not present

## 2023-11-25 DIAGNOSIS — Z7901 Long term (current) use of anticoagulants: Secondary | ICD-10-CM | POA: Diagnosis not present

## 2023-11-25 DIAGNOSIS — Z89612 Acquired absence of left leg above knee: Secondary | ICD-10-CM | POA: Diagnosis not present

## 2023-12-01 DIAGNOSIS — Z7901 Long term (current) use of anticoagulants: Secondary | ICD-10-CM | POA: Diagnosis not present

## 2023-12-01 DIAGNOSIS — R791 Abnormal coagulation profile: Secondary | ICD-10-CM | POA: Diagnosis not present

## 2023-12-02 DIAGNOSIS — I10 Essential (primary) hypertension: Secondary | ICD-10-CM | POA: Diagnosis not present

## 2023-12-06 DIAGNOSIS — D469 Myelodysplastic syndrome, unspecified: Secondary | ICD-10-CM | POA: Diagnosis not present

## 2023-12-06 DIAGNOSIS — M48061 Spinal stenosis, lumbar region without neurogenic claudication: Secondary | ICD-10-CM | POA: Diagnosis not present

## 2023-12-06 DIAGNOSIS — M6281 Muscle weakness (generalized): Secondary | ICD-10-CM | POA: Diagnosis not present

## 2023-12-06 DIAGNOSIS — R2681 Unsteadiness on feet: Secondary | ICD-10-CM | POA: Diagnosis not present

## 2023-12-07 DIAGNOSIS — M6281 Muscle weakness (generalized): Secondary | ICD-10-CM | POA: Diagnosis not present

## 2023-12-07 DIAGNOSIS — R2681 Unsteadiness on feet: Secondary | ICD-10-CM | POA: Diagnosis not present

## 2023-12-07 DIAGNOSIS — M48061 Spinal stenosis, lumbar region without neurogenic claudication: Secondary | ICD-10-CM | POA: Diagnosis not present

## 2023-12-07 DIAGNOSIS — D469 Myelodysplastic syndrome, unspecified: Secondary | ICD-10-CM | POA: Diagnosis not present

## 2023-12-08 DIAGNOSIS — D469 Myelodysplastic syndrome, unspecified: Secondary | ICD-10-CM | POA: Diagnosis not present

## 2023-12-08 DIAGNOSIS — R2681 Unsteadiness on feet: Secondary | ICD-10-CM | POA: Diagnosis not present

## 2023-12-08 DIAGNOSIS — M6281 Muscle weakness (generalized): Secondary | ICD-10-CM | POA: Diagnosis not present

## 2023-12-08 DIAGNOSIS — M48061 Spinal stenosis, lumbar region without neurogenic claudication: Secondary | ICD-10-CM | POA: Diagnosis not present

## 2023-12-09 DIAGNOSIS — M6281 Muscle weakness (generalized): Secondary | ICD-10-CM | POA: Diagnosis not present

## 2023-12-09 DIAGNOSIS — M48061 Spinal stenosis, lumbar region without neurogenic claudication: Secondary | ICD-10-CM | POA: Diagnosis not present

## 2023-12-09 DIAGNOSIS — D469 Myelodysplastic syndrome, unspecified: Secondary | ICD-10-CM | POA: Diagnosis not present

## 2023-12-09 DIAGNOSIS — R2681 Unsteadiness on feet: Secondary | ICD-10-CM | POA: Diagnosis not present

## 2023-12-12 DIAGNOSIS — D469 Myelodysplastic syndrome, unspecified: Secondary | ICD-10-CM | POA: Diagnosis not present

## 2023-12-12 DIAGNOSIS — M6281 Muscle weakness (generalized): Secondary | ICD-10-CM | POA: Diagnosis not present

## 2023-12-12 DIAGNOSIS — R2681 Unsteadiness on feet: Secondary | ICD-10-CM | POA: Diagnosis not present

## 2023-12-12 DIAGNOSIS — M48061 Spinal stenosis, lumbar region without neurogenic claudication: Secondary | ICD-10-CM | POA: Diagnosis not present

## 2023-12-13 DIAGNOSIS — M6281 Muscle weakness (generalized): Secondary | ICD-10-CM | POA: Diagnosis not present

## 2023-12-13 DIAGNOSIS — R2681 Unsteadiness on feet: Secondary | ICD-10-CM | POA: Diagnosis not present

## 2023-12-13 DIAGNOSIS — M48061 Spinal stenosis, lumbar region without neurogenic claudication: Secondary | ICD-10-CM | POA: Diagnosis not present

## 2023-12-13 DIAGNOSIS — D469 Myelodysplastic syndrome, unspecified: Secondary | ICD-10-CM | POA: Diagnosis not present

## 2023-12-14 DIAGNOSIS — M48061 Spinal stenosis, lumbar region without neurogenic claudication: Secondary | ICD-10-CM | POA: Diagnosis not present

## 2023-12-14 DIAGNOSIS — R2681 Unsteadiness on feet: Secondary | ICD-10-CM | POA: Diagnosis not present

## 2023-12-14 DIAGNOSIS — M6281 Muscle weakness (generalized): Secondary | ICD-10-CM | POA: Diagnosis not present

## 2023-12-14 DIAGNOSIS — D469 Myelodysplastic syndrome, unspecified: Secondary | ICD-10-CM | POA: Diagnosis not present

## 2023-12-15 DIAGNOSIS — R2681 Unsteadiness on feet: Secondary | ICD-10-CM | POA: Diagnosis not present

## 2023-12-15 DIAGNOSIS — M48061 Spinal stenosis, lumbar region without neurogenic claudication: Secondary | ICD-10-CM | POA: Diagnosis not present

## 2023-12-15 DIAGNOSIS — M6281 Muscle weakness (generalized): Secondary | ICD-10-CM | POA: Diagnosis not present

## 2023-12-15 DIAGNOSIS — D469 Myelodysplastic syndrome, unspecified: Secondary | ICD-10-CM | POA: Diagnosis not present

## 2023-12-16 DIAGNOSIS — Z79891 Long term (current) use of opiate analgesic: Secondary | ICD-10-CM | POA: Diagnosis not present

## 2023-12-16 DIAGNOSIS — Z89612 Acquired absence of left leg above knee: Secondary | ICD-10-CM | POA: Diagnosis not present

## 2023-12-16 DIAGNOSIS — M48061 Spinal stenosis, lumbar region without neurogenic claudication: Secondary | ICD-10-CM | POA: Diagnosis not present

## 2023-12-16 DIAGNOSIS — M47816 Spondylosis without myelopathy or radiculopathy, lumbar region: Secondary | ICD-10-CM | POA: Diagnosis not present

## 2023-12-16 DIAGNOSIS — G894 Chronic pain syndrome: Secondary | ICD-10-CM | POA: Diagnosis not present

## 2023-12-16 DIAGNOSIS — M6281 Muscle weakness (generalized): Secondary | ICD-10-CM | POA: Diagnosis not present

## 2023-12-16 DIAGNOSIS — R2681 Unsteadiness on feet: Secondary | ICD-10-CM | POA: Diagnosis not present

## 2023-12-16 DIAGNOSIS — D469 Myelodysplastic syndrome, unspecified: Secondary | ICD-10-CM | POA: Diagnosis not present

## 2023-12-19 DIAGNOSIS — M48061 Spinal stenosis, lumbar region without neurogenic claudication: Secondary | ICD-10-CM | POA: Diagnosis not present

## 2023-12-19 DIAGNOSIS — R2681 Unsteadiness on feet: Secondary | ICD-10-CM | POA: Diagnosis not present

## 2023-12-19 DIAGNOSIS — D469 Myelodysplastic syndrome, unspecified: Secondary | ICD-10-CM | POA: Diagnosis not present

## 2023-12-19 DIAGNOSIS — M6281 Muscle weakness (generalized): Secondary | ICD-10-CM | POA: Diagnosis not present

## 2023-12-20 DIAGNOSIS — R2681 Unsteadiness on feet: Secondary | ICD-10-CM | POA: Diagnosis not present

## 2023-12-20 DIAGNOSIS — R52 Pain, unspecified: Secondary | ICD-10-CM | POA: Diagnosis not present

## 2023-12-20 DIAGNOSIS — D469 Myelodysplastic syndrome, unspecified: Secondary | ICD-10-CM | POA: Diagnosis not present

## 2023-12-20 DIAGNOSIS — M48061 Spinal stenosis, lumbar region without neurogenic claudication: Secondary | ICD-10-CM | POA: Diagnosis not present

## 2023-12-20 DIAGNOSIS — Z89612 Acquired absence of left leg above knee: Secondary | ICD-10-CM | POA: Diagnosis not present

## 2023-12-20 DIAGNOSIS — M6281 Muscle weakness (generalized): Secondary | ICD-10-CM | POA: Diagnosis not present

## 2023-12-21 DIAGNOSIS — M48061 Spinal stenosis, lumbar region without neurogenic claudication: Secondary | ICD-10-CM | POA: Diagnosis not present

## 2023-12-21 DIAGNOSIS — M6281 Muscle weakness (generalized): Secondary | ICD-10-CM | POA: Diagnosis not present

## 2023-12-21 DIAGNOSIS — R2681 Unsteadiness on feet: Secondary | ICD-10-CM | POA: Diagnosis not present

## 2023-12-21 DIAGNOSIS — M79642 Pain in left hand: Secondary | ICD-10-CM | POA: Diagnosis not present

## 2023-12-21 DIAGNOSIS — D469 Myelodysplastic syndrome, unspecified: Secondary | ICD-10-CM | POA: Diagnosis not present

## 2023-12-21 DIAGNOSIS — R52 Pain, unspecified: Secondary | ICD-10-CM | POA: Diagnosis not present

## 2023-12-21 DIAGNOSIS — M19042 Primary osteoarthritis, left hand: Secondary | ICD-10-CM | POA: Diagnosis not present

## 2023-12-23 DIAGNOSIS — M19042 Primary osteoarthritis, left hand: Secondary | ICD-10-CM | POA: Diagnosis not present

## 2023-12-23 DIAGNOSIS — M85842 Other specified disorders of bone density and structure, left hand: Secondary | ICD-10-CM | POA: Diagnosis not present

## 2023-12-23 DIAGNOSIS — M79642 Pain in left hand: Secondary | ICD-10-CM | POA: Diagnosis not present

## 2023-12-24 DIAGNOSIS — L0390 Cellulitis, unspecified: Secondary | ICD-10-CM | POA: Diagnosis not present

## 2023-12-25 DIAGNOSIS — D469 Myelodysplastic syndrome, unspecified: Secondary | ICD-10-CM | POA: Diagnosis not present

## 2023-12-25 DIAGNOSIS — R2681 Unsteadiness on feet: Secondary | ICD-10-CM | POA: Diagnosis not present

## 2023-12-25 DIAGNOSIS — M6281 Muscle weakness (generalized): Secondary | ICD-10-CM | POA: Diagnosis not present

## 2023-12-25 DIAGNOSIS — M48061 Spinal stenosis, lumbar region without neurogenic claudication: Secondary | ICD-10-CM | POA: Diagnosis not present

## 2023-12-26 DIAGNOSIS — D469 Myelodysplastic syndrome, unspecified: Secondary | ICD-10-CM | POA: Diagnosis not present

## 2023-12-26 DIAGNOSIS — R2681 Unsteadiness on feet: Secondary | ICD-10-CM | POA: Diagnosis not present

## 2023-12-26 DIAGNOSIS — M48061 Spinal stenosis, lumbar region without neurogenic claudication: Secondary | ICD-10-CM | POA: Diagnosis not present

## 2023-12-26 DIAGNOSIS — M6281 Muscle weakness (generalized): Secondary | ICD-10-CM | POA: Diagnosis not present

## 2023-12-27 DIAGNOSIS — M6281 Muscle weakness (generalized): Secondary | ICD-10-CM | POA: Diagnosis not present

## 2023-12-27 DIAGNOSIS — M79642 Pain in left hand: Secondary | ICD-10-CM | POA: Diagnosis not present

## 2023-12-27 DIAGNOSIS — D469 Myelodysplastic syndrome, unspecified: Secondary | ICD-10-CM | POA: Diagnosis not present

## 2023-12-27 DIAGNOSIS — R2681 Unsteadiness on feet: Secondary | ICD-10-CM | POA: Diagnosis not present

## 2023-12-27 DIAGNOSIS — M48061 Spinal stenosis, lumbar region without neurogenic claudication: Secondary | ICD-10-CM | POA: Diagnosis not present

## 2023-12-28 DIAGNOSIS — M6281 Muscle weakness (generalized): Secondary | ICD-10-CM | POA: Diagnosis not present

## 2023-12-28 DIAGNOSIS — D469 Myelodysplastic syndrome, unspecified: Secondary | ICD-10-CM | POA: Diagnosis not present

## 2023-12-28 DIAGNOSIS — R2681 Unsteadiness on feet: Secondary | ICD-10-CM | POA: Diagnosis not present

## 2023-12-28 DIAGNOSIS — M48061 Spinal stenosis, lumbar region without neurogenic claudication: Secondary | ICD-10-CM | POA: Diagnosis not present

## 2023-12-29 DIAGNOSIS — R2681 Unsteadiness on feet: Secondary | ICD-10-CM | POA: Diagnosis not present

## 2023-12-29 DIAGNOSIS — M48061 Spinal stenosis, lumbar region without neurogenic claudication: Secondary | ICD-10-CM | POA: Diagnosis not present

## 2023-12-29 DIAGNOSIS — D469 Myelodysplastic syndrome, unspecified: Secondary | ICD-10-CM | POA: Diagnosis not present

## 2023-12-29 DIAGNOSIS — M6281 Muscle weakness (generalized): Secondary | ICD-10-CM | POA: Diagnosis not present

## 2023-12-30 DIAGNOSIS — R2681 Unsteadiness on feet: Secondary | ICD-10-CM | POA: Diagnosis not present

## 2023-12-30 DIAGNOSIS — D469 Myelodysplastic syndrome, unspecified: Secondary | ICD-10-CM | POA: Diagnosis not present

## 2023-12-30 DIAGNOSIS — M48061 Spinal stenosis, lumbar region without neurogenic claudication: Secondary | ICD-10-CM | POA: Diagnosis not present

## 2023-12-30 DIAGNOSIS — M6281 Muscle weakness (generalized): Secondary | ICD-10-CM | POA: Diagnosis not present

## 2023-12-31 DIAGNOSIS — D469 Myelodysplastic syndrome, unspecified: Secondary | ICD-10-CM | POA: Diagnosis not present

## 2023-12-31 DIAGNOSIS — M6281 Muscle weakness (generalized): Secondary | ICD-10-CM | POA: Diagnosis not present

## 2023-12-31 DIAGNOSIS — R2681 Unsteadiness on feet: Secondary | ICD-10-CM | POA: Diagnosis not present

## 2023-12-31 DIAGNOSIS — M48061 Spinal stenosis, lumbar region without neurogenic claudication: Secondary | ICD-10-CM | POA: Diagnosis not present

## 2024-01-02 DIAGNOSIS — D469 Myelodysplastic syndrome, unspecified: Secondary | ICD-10-CM | POA: Diagnosis not present

## 2024-01-02 DIAGNOSIS — M6281 Muscle weakness (generalized): Secondary | ICD-10-CM | POA: Diagnosis not present

## 2024-01-02 DIAGNOSIS — R2681 Unsteadiness on feet: Secondary | ICD-10-CM | POA: Diagnosis not present

## 2024-01-02 DIAGNOSIS — R52 Pain, unspecified: Secondary | ICD-10-CM | POA: Diagnosis not present

## 2024-01-02 DIAGNOSIS — M79642 Pain in left hand: Secondary | ICD-10-CM | POA: Diagnosis not present

## 2024-01-02 DIAGNOSIS — M48061 Spinal stenosis, lumbar region without neurogenic claudication: Secondary | ICD-10-CM | POA: Diagnosis not present

## 2024-01-03 DIAGNOSIS — D469 Myelodysplastic syndrome, unspecified: Secondary | ICD-10-CM | POA: Diagnosis not present

## 2024-01-03 DIAGNOSIS — M48061 Spinal stenosis, lumbar region without neurogenic claudication: Secondary | ICD-10-CM | POA: Diagnosis not present

## 2024-01-03 DIAGNOSIS — R2681 Unsteadiness on feet: Secondary | ICD-10-CM | POA: Diagnosis not present

## 2024-01-03 DIAGNOSIS — M6281 Muscle weakness (generalized): Secondary | ICD-10-CM | POA: Diagnosis not present

## 2024-01-04 DIAGNOSIS — D638 Anemia in other chronic diseases classified elsewhere: Secondary | ICD-10-CM | POA: Diagnosis not present

## 2024-01-04 DIAGNOSIS — R2681 Unsteadiness on feet: Secondary | ICD-10-CM | POA: Diagnosis not present

## 2024-01-04 DIAGNOSIS — Z86711 Personal history of pulmonary embolism: Secondary | ICD-10-CM | POA: Diagnosis not present

## 2024-01-04 DIAGNOSIS — N184 Chronic kidney disease, stage 4 (severe): Secondary | ICD-10-CM | POA: Diagnosis not present

## 2024-01-04 DIAGNOSIS — D469 Myelodysplastic syndrome, unspecified: Secondary | ICD-10-CM | POA: Diagnosis not present

## 2024-01-04 DIAGNOSIS — Z903 Acquired absence of stomach [part of]: Secondary | ICD-10-CM | POA: Diagnosis not present

## 2024-01-04 DIAGNOSIS — M545 Low back pain, unspecified: Secondary | ICD-10-CM | POA: Diagnosis not present

## 2024-01-04 DIAGNOSIS — G8929 Other chronic pain: Secondary | ICD-10-CM | POA: Diagnosis not present

## 2024-01-04 DIAGNOSIS — R519 Headache, unspecified: Secondary | ICD-10-CM | POA: Diagnosis not present

## 2024-01-04 DIAGNOSIS — M6281 Muscle weakness (generalized): Secondary | ICD-10-CM | POA: Diagnosis not present

## 2024-01-04 DIAGNOSIS — Z86718 Personal history of other venous thrombosis and embolism: Secondary | ICD-10-CM | POA: Diagnosis not present

## 2024-01-04 DIAGNOSIS — E538 Deficiency of other specified B group vitamins: Secondary | ICD-10-CM | POA: Diagnosis not present

## 2024-01-04 DIAGNOSIS — D500 Iron deficiency anemia secondary to blood loss (chronic): Secondary | ICD-10-CM | POA: Diagnosis not present

## 2024-01-04 DIAGNOSIS — L03114 Cellulitis of left upper limb: Secondary | ICD-10-CM | POA: Diagnosis not present

## 2024-01-04 DIAGNOSIS — C4022 Malignant neoplasm of long bones of left lower limb: Secondary | ICD-10-CM | POA: Diagnosis not present

## 2024-01-04 DIAGNOSIS — M48061 Spinal stenosis, lumbar region without neurogenic claudication: Secondary | ICD-10-CM | POA: Diagnosis not present

## 2024-01-04 DIAGNOSIS — R0982 Postnasal drip: Secondary | ICD-10-CM | POA: Diagnosis not present

## 2024-01-04 DIAGNOSIS — Z7901 Long term (current) use of anticoagulants: Secondary | ICD-10-CM | POA: Diagnosis not present

## 2024-01-04 DIAGNOSIS — K909 Intestinal malabsorption, unspecified: Secondary | ICD-10-CM | POA: Diagnosis not present

## 2024-01-05 DIAGNOSIS — M6281 Muscle weakness (generalized): Secondary | ICD-10-CM | POA: Diagnosis not present

## 2024-01-05 DIAGNOSIS — Z7901 Long term (current) use of anticoagulants: Secondary | ICD-10-CM | POA: Diagnosis not present

## 2024-01-05 DIAGNOSIS — D469 Myelodysplastic syndrome, unspecified: Secondary | ICD-10-CM | POA: Diagnosis not present

## 2024-01-05 DIAGNOSIS — R2681 Unsteadiness on feet: Secondary | ICD-10-CM | POA: Diagnosis not present

## 2024-01-05 DIAGNOSIS — M48061 Spinal stenosis, lumbar region without neurogenic claudication: Secondary | ICD-10-CM | POA: Diagnosis not present

## 2024-01-11 DIAGNOSIS — M48061 Spinal stenosis, lumbar region without neurogenic claudication: Secondary | ICD-10-CM | POA: Diagnosis not present

## 2024-01-11 DIAGNOSIS — L989 Disorder of the skin and subcutaneous tissue, unspecified: Secondary | ICD-10-CM | POA: Diagnosis not present

## 2024-01-11 DIAGNOSIS — D469 Myelodysplastic syndrome, unspecified: Secondary | ICD-10-CM | POA: Diagnosis not present

## 2024-01-11 DIAGNOSIS — R52 Pain, unspecified: Secondary | ICD-10-CM | POA: Diagnosis not present

## 2024-01-11 DIAGNOSIS — R2681 Unsteadiness on feet: Secondary | ICD-10-CM | POA: Diagnosis not present

## 2024-01-11 DIAGNOSIS — M6281 Muscle weakness (generalized): Secondary | ICD-10-CM | POA: Diagnosis not present

## 2024-01-12 DIAGNOSIS — M6281 Muscle weakness (generalized): Secondary | ICD-10-CM | POA: Diagnosis not present

## 2024-01-12 DIAGNOSIS — D469 Myelodysplastic syndrome, unspecified: Secondary | ICD-10-CM | POA: Diagnosis not present

## 2024-01-12 DIAGNOSIS — R2681 Unsteadiness on feet: Secondary | ICD-10-CM | POA: Diagnosis not present

## 2024-01-12 DIAGNOSIS — M48061 Spinal stenosis, lumbar region without neurogenic claudication: Secondary | ICD-10-CM | POA: Diagnosis not present

## 2024-01-16 DIAGNOSIS — D469 Myelodysplastic syndrome, unspecified: Secondary | ICD-10-CM | POA: Diagnosis not present

## 2024-01-16 DIAGNOSIS — M6281 Muscle weakness (generalized): Secondary | ICD-10-CM | POA: Diagnosis not present

## 2024-01-16 DIAGNOSIS — M48061 Spinal stenosis, lumbar region without neurogenic claudication: Secondary | ICD-10-CM | POA: Diagnosis not present

## 2024-01-16 DIAGNOSIS — R2681 Unsteadiness on feet: Secondary | ICD-10-CM | POA: Diagnosis not present

## 2024-01-18 ENCOUNTER — Encounter: Payer: Self-pay | Admitting: Obstetrics and Gynecology

## 2024-01-18 ENCOUNTER — Telehealth: Admitting: Obstetrics and Gynecology

## 2024-01-18 DIAGNOSIS — N3281 Overactive bladder: Secondary | ICD-10-CM | POA: Diagnosis not present

## 2024-01-18 DIAGNOSIS — M6281 Muscle weakness (generalized): Secondary | ICD-10-CM | POA: Diagnosis not present

## 2024-01-18 DIAGNOSIS — R2681 Unsteadiness on feet: Secondary | ICD-10-CM | POA: Diagnosis not present

## 2024-01-18 DIAGNOSIS — D469 Myelodysplastic syndrome, unspecified: Secondary | ICD-10-CM | POA: Diagnosis not present

## 2024-01-18 DIAGNOSIS — M48061 Spinal stenosis, lumbar region without neurogenic claudication: Secondary | ICD-10-CM | POA: Diagnosis not present

## 2024-01-18 MED ORDER — TROSPIUM CHLORIDE ER 60 MG PO CP24
1.0000 | ORAL_CAPSULE | Freq: Every day | ORAL | 3 refills | Status: AC
Start: 2024-01-18 — End: ?

## 2024-01-18 NOTE — Progress Notes (Signed)
 Virtual Visit via Video Note  I connected with Gloria Mckay on 01/18/24 at 11:00 AM EDT by a video enabled telemedicine application and verified that I am speaking with the correct person using two identifiers.  Location: Patient: At her facility in [place] Provider: In office with closed door and headset on   I discussed the limitations of evaluation and management by telemedicine and the availability of in person appointments. The patient expressed understanding and agreed to proceed.  History of Present Illness: Patient has a hx of OAB. We have attempted Gemtesa  75mg  daily and she did okay on this for a month and then discontinued the medication due to headaches. She discussed options of tertiary therapy with Dr. Aron Lard including bladder botox, SNM, and PTNS. She wanted to try another medication at that time and was started on Trospium  60mg  ER daily.    Patient reports she has had less FI as the Trospium  has been mildly constipating, but she has been doing well at nighttime and has had less frequency/urgency with her bladder.  Patient has been having limb pain and back pain. She has been placed on Fentanyl patches every 72 hours and is hopeful this will help with her pain.   Patient is still doing PT, but reports she had a bout of cellulitis in her hand after trying to do hand PT for her trigger fingers, which has also set back some of her recovery.   Nutrition wise, her daughter was able to get her the dairy free protein shakes.     Observations/Objective:  Patient is alert oriented and responding appropriately. She is well dressed and speaking in full sentences.    Assessment and Plan:  Patient would like to continue on the Trospium  60mg  ER daily. Refills provided and will fax to facility.  Patient to continue estrogen cream x2 weekly.  Patient can take Miralax PRN for constipation if her current diet regimen is not working.    Follow Up Instructions:  Patient to follow  up in 6 months or sooner if needed.     I discussed the assessment and treatment plan with the patient. The patient was provided an opportunity to ask questions and all were answered. The patient agreed with the plan and demonstrated an understanding of the instructions.   The patient was advised to call back or seek an in-person evaluation if the symptoms worsen or if the condition fails to improve as anticipated.  I provided 15 minutes of non-face-to-face time during this encounter.   [name], NP

## 2024-01-20 DIAGNOSIS — R52 Pain, unspecified: Secondary | ICD-10-CM | POA: Diagnosis not present

## 2024-01-20 DIAGNOSIS — R2241 Localized swelling, mass and lump, right lower limb: Secondary | ICD-10-CM | POA: Diagnosis not present

## 2024-01-22 DIAGNOSIS — R2241 Localized swelling, mass and lump, right lower limb: Secondary | ICD-10-CM | POA: Diagnosis not present

## 2024-01-22 DIAGNOSIS — M1711 Unilateral primary osteoarthritis, right knee: Secondary | ICD-10-CM | POA: Diagnosis not present

## 2024-01-23 DIAGNOSIS — R2681 Unsteadiness on feet: Secondary | ICD-10-CM | POA: Diagnosis not present

## 2024-01-23 DIAGNOSIS — D469 Myelodysplastic syndrome, unspecified: Secondary | ICD-10-CM | POA: Diagnosis not present

## 2024-01-23 DIAGNOSIS — M48061 Spinal stenosis, lumbar region without neurogenic claudication: Secondary | ICD-10-CM | POA: Diagnosis not present

## 2024-01-23 DIAGNOSIS — M6281 Muscle weakness (generalized): Secondary | ICD-10-CM | POA: Diagnosis not present

## 2024-01-24 DIAGNOSIS — D469 Myelodysplastic syndrome, unspecified: Secondary | ICD-10-CM | POA: Diagnosis not present

## 2024-01-24 DIAGNOSIS — R2681 Unsteadiness on feet: Secondary | ICD-10-CM | POA: Diagnosis not present

## 2024-01-24 DIAGNOSIS — M48061 Spinal stenosis, lumbar region without neurogenic claudication: Secondary | ICD-10-CM | POA: Diagnosis not present

## 2024-01-24 DIAGNOSIS — M6281 Muscle weakness (generalized): Secondary | ICD-10-CM | POA: Diagnosis not present

## 2024-01-25 DIAGNOSIS — N184 Chronic kidney disease, stage 4 (severe): Secondary | ICD-10-CM | POA: Diagnosis not present

## 2024-01-25 DIAGNOSIS — I10 Essential (primary) hypertension: Secondary | ICD-10-CM | POA: Diagnosis not present

## 2024-01-25 DIAGNOSIS — I482 Chronic atrial fibrillation, unspecified: Secondary | ICD-10-CM | POA: Diagnosis not present

## 2024-01-25 DIAGNOSIS — M109 Gout, unspecified: Secondary | ICD-10-CM | POA: Diagnosis not present

## 2024-01-25 DIAGNOSIS — R52 Pain, unspecified: Secondary | ICD-10-CM | POA: Diagnosis not present

## 2024-01-26 DIAGNOSIS — M6281 Muscle weakness (generalized): Secondary | ICD-10-CM | POA: Diagnosis not present

## 2024-01-26 DIAGNOSIS — D1801 Hemangioma of skin and subcutaneous tissue: Secondary | ICD-10-CM | POA: Diagnosis not present

## 2024-01-26 DIAGNOSIS — R2681 Unsteadiness on feet: Secondary | ICD-10-CM | POA: Diagnosis not present

## 2024-01-26 DIAGNOSIS — L821 Other seborrheic keratosis: Secondary | ICD-10-CM | POA: Diagnosis not present

## 2024-01-26 DIAGNOSIS — M48061 Spinal stenosis, lumbar region without neurogenic claudication: Secondary | ICD-10-CM | POA: Diagnosis not present

## 2024-01-26 DIAGNOSIS — E1159 Type 2 diabetes mellitus with other circulatory complications: Secondary | ICD-10-CM | POA: Diagnosis not present

## 2024-01-26 DIAGNOSIS — I781 Nevus, non-neoplastic: Secondary | ICD-10-CM | POA: Diagnosis not present

## 2024-01-26 DIAGNOSIS — D469 Myelodysplastic syndrome, unspecified: Secondary | ICD-10-CM | POA: Diagnosis not present

## 2024-01-26 DIAGNOSIS — L814 Other melanin hyperpigmentation: Secondary | ICD-10-CM | POA: Diagnosis not present

## 2024-01-26 DIAGNOSIS — B351 Tinea unguium: Secondary | ICD-10-CM | POA: Diagnosis not present

## 2024-01-30 DIAGNOSIS — M6281 Muscle weakness (generalized): Secondary | ICD-10-CM | POA: Diagnosis not present

## 2024-01-30 DIAGNOSIS — M48061 Spinal stenosis, lumbar region without neurogenic claudication: Secondary | ICD-10-CM | POA: Diagnosis not present

## 2024-01-30 DIAGNOSIS — R2681 Unsteadiness on feet: Secondary | ICD-10-CM | POA: Diagnosis not present

## 2024-01-30 DIAGNOSIS — D469 Myelodysplastic syndrome, unspecified: Secondary | ICD-10-CM | POA: Diagnosis not present

## 2024-02-01 DIAGNOSIS — R2681 Unsteadiness on feet: Secondary | ICD-10-CM | POA: Diagnosis not present

## 2024-02-01 DIAGNOSIS — M6281 Muscle weakness (generalized): Secondary | ICD-10-CM | POA: Diagnosis not present

## 2024-02-01 DIAGNOSIS — D469 Myelodysplastic syndrome, unspecified: Secondary | ICD-10-CM | POA: Diagnosis not present

## 2024-02-01 DIAGNOSIS — M48061 Spinal stenosis, lumbar region without neurogenic claudication: Secondary | ICD-10-CM | POA: Diagnosis not present

## 2024-02-02 DIAGNOSIS — R2681 Unsteadiness on feet: Secondary | ICD-10-CM | POA: Diagnosis not present

## 2024-02-02 DIAGNOSIS — M48061 Spinal stenosis, lumbar region without neurogenic claudication: Secondary | ICD-10-CM | POA: Diagnosis not present

## 2024-02-02 DIAGNOSIS — D469 Myelodysplastic syndrome, unspecified: Secondary | ICD-10-CM | POA: Diagnosis not present

## 2024-02-02 DIAGNOSIS — M6281 Muscle weakness (generalized): Secondary | ICD-10-CM | POA: Diagnosis not present

## 2024-02-02 DIAGNOSIS — Z7901 Long term (current) use of anticoagulants: Secondary | ICD-10-CM | POA: Diagnosis not present

## 2024-02-03 DIAGNOSIS — M109 Gout, unspecified: Secondary | ICD-10-CM | POA: Diagnosis not present

## 2024-02-05 DIAGNOSIS — M48061 Spinal stenosis, lumbar region without neurogenic claudication: Secondary | ICD-10-CM | POA: Diagnosis not present

## 2024-02-05 DIAGNOSIS — M6281 Muscle weakness (generalized): Secondary | ICD-10-CM | POA: Diagnosis not present

## 2024-02-05 DIAGNOSIS — R2681 Unsteadiness on feet: Secondary | ICD-10-CM | POA: Diagnosis not present

## 2024-02-05 DIAGNOSIS — D469 Myelodysplastic syndrome, unspecified: Secondary | ICD-10-CM | POA: Diagnosis not present

## 2024-02-07 DIAGNOSIS — M6281 Muscle weakness (generalized): Secondary | ICD-10-CM | POA: Diagnosis not present

## 2024-02-07 DIAGNOSIS — R2681 Unsteadiness on feet: Secondary | ICD-10-CM | POA: Diagnosis not present

## 2024-02-07 DIAGNOSIS — M48061 Spinal stenosis, lumbar region without neurogenic claudication: Secondary | ICD-10-CM | POA: Diagnosis not present

## 2024-02-07 DIAGNOSIS — D469 Myelodysplastic syndrome, unspecified: Secondary | ICD-10-CM | POA: Diagnosis not present

## 2024-02-09 DIAGNOSIS — D469 Myelodysplastic syndrome, unspecified: Secondary | ICD-10-CM | POA: Diagnosis not present

## 2024-02-09 DIAGNOSIS — M48061 Spinal stenosis, lumbar region without neurogenic claudication: Secondary | ICD-10-CM | POA: Diagnosis not present

## 2024-02-09 DIAGNOSIS — M6281 Muscle weakness (generalized): Secondary | ICD-10-CM | POA: Diagnosis not present

## 2024-02-09 DIAGNOSIS — R2681 Unsteadiness on feet: Secondary | ICD-10-CM | POA: Diagnosis not present

## 2024-02-13 DIAGNOSIS — M6281 Muscle weakness (generalized): Secondary | ICD-10-CM | POA: Diagnosis not present

## 2024-02-13 DIAGNOSIS — M48061 Spinal stenosis, lumbar region without neurogenic claudication: Secondary | ICD-10-CM | POA: Diagnosis not present

## 2024-02-13 DIAGNOSIS — D469 Myelodysplastic syndrome, unspecified: Secondary | ICD-10-CM | POA: Diagnosis not present

## 2024-02-13 DIAGNOSIS — R2681 Unsteadiness on feet: Secondary | ICD-10-CM | POA: Diagnosis not present

## 2024-02-14 DIAGNOSIS — D469 Myelodysplastic syndrome, unspecified: Secondary | ICD-10-CM | POA: Diagnosis not present

## 2024-02-14 DIAGNOSIS — M48061 Spinal stenosis, lumbar region without neurogenic claudication: Secondary | ICD-10-CM | POA: Diagnosis not present

## 2024-02-14 DIAGNOSIS — R2681 Unsteadiness on feet: Secondary | ICD-10-CM | POA: Diagnosis not present

## 2024-02-14 DIAGNOSIS — M6281 Muscle weakness (generalized): Secondary | ICD-10-CM | POA: Diagnosis not present

## 2024-02-16 DIAGNOSIS — H16103 Unspecified superficial keratitis, bilateral: Secondary | ICD-10-CM | POA: Diagnosis not present

## 2024-02-16 DIAGNOSIS — H16223 Keratoconjunctivitis sicca, not specified as Sjogren's, bilateral: Secondary | ICD-10-CM | POA: Diagnosis not present

## 2024-02-16 DIAGNOSIS — H16102 Unspecified superficial keratitis, left eye: Secondary | ICD-10-CM | POA: Diagnosis not present

## 2024-02-16 DIAGNOSIS — Z7901 Long term (current) use of anticoagulants: Secondary | ICD-10-CM | POA: Diagnosis not present

## 2024-02-16 DIAGNOSIS — R791 Abnormal coagulation profile: Secondary | ICD-10-CM | POA: Diagnosis not present

## 2024-02-16 DIAGNOSIS — Z961 Presence of intraocular lens: Secondary | ICD-10-CM | POA: Diagnosis not present

## 2024-02-16 DIAGNOSIS — H401231 Low-tension glaucoma, bilateral, mild stage: Secondary | ICD-10-CM | POA: Diagnosis not present

## 2024-02-20 DIAGNOSIS — H6123 Impacted cerumen, bilateral: Secondary | ICD-10-CM | POA: Diagnosis not present

## 2024-02-20 DIAGNOSIS — J302 Other seasonal allergic rhinitis: Secondary | ICD-10-CM | POA: Diagnosis not present

## 2024-02-20 DIAGNOSIS — Z7901 Long term (current) use of anticoagulants: Secondary | ICD-10-CM | POA: Diagnosis not present

## 2024-02-20 DIAGNOSIS — R791 Abnormal coagulation profile: Secondary | ICD-10-CM | POA: Diagnosis not present

## 2024-02-21 DIAGNOSIS — I4891 Unspecified atrial fibrillation: Secondary | ICD-10-CM | POA: Diagnosis not present

## 2024-02-21 DIAGNOSIS — Z7901 Long term (current) use of anticoagulants: Secondary | ICD-10-CM | POA: Diagnosis not present

## 2024-02-22 DIAGNOSIS — R791 Abnormal coagulation profile: Secondary | ICD-10-CM | POA: Diagnosis not present

## 2024-02-22 DIAGNOSIS — Z7901 Long term (current) use of anticoagulants: Secondary | ICD-10-CM | POA: Diagnosis not present

## 2024-02-23 DIAGNOSIS — Z7901 Long term (current) use of anticoagulants: Secondary | ICD-10-CM | POA: Diagnosis not present

## 2024-02-23 DIAGNOSIS — R791 Abnormal coagulation profile: Secondary | ICD-10-CM | POA: Diagnosis not present

## 2024-02-28 DIAGNOSIS — I482 Chronic atrial fibrillation, unspecified: Secondary | ICD-10-CM | POA: Diagnosis not present

## 2024-02-28 DIAGNOSIS — Z7901 Long term (current) use of anticoagulants: Secondary | ICD-10-CM | POA: Diagnosis not present

## 2024-02-28 DIAGNOSIS — R42 Dizziness and giddiness: Secondary | ICD-10-CM | POA: Diagnosis not present

## 2024-02-29 DIAGNOSIS — I10 Essential (primary) hypertension: Secondary | ICD-10-CM | POA: Diagnosis not present

## 2024-02-29 DIAGNOSIS — D649 Anemia, unspecified: Secondary | ICD-10-CM | POA: Diagnosis not present

## 2024-03-05 DIAGNOSIS — M6281 Muscle weakness (generalized): Secondary | ICD-10-CM | POA: Diagnosis not present

## 2024-03-05 DIAGNOSIS — D469 Myelodysplastic syndrome, unspecified: Secondary | ICD-10-CM | POA: Diagnosis not present

## 2024-03-05 DIAGNOSIS — M48061 Spinal stenosis, lumbar region without neurogenic claudication: Secondary | ICD-10-CM | POA: Diagnosis not present

## 2024-03-05 DIAGNOSIS — R2681 Unsteadiness on feet: Secondary | ICD-10-CM | POA: Diagnosis not present

## 2024-03-08 DIAGNOSIS — M48061 Spinal stenosis, lumbar region without neurogenic claudication: Secondary | ICD-10-CM | POA: Diagnosis not present

## 2024-03-08 DIAGNOSIS — R2681 Unsteadiness on feet: Secondary | ICD-10-CM | POA: Diagnosis not present

## 2024-03-08 DIAGNOSIS — D469 Myelodysplastic syndrome, unspecified: Secondary | ICD-10-CM | POA: Diagnosis not present

## 2024-03-08 DIAGNOSIS — M6281 Muscle weakness (generalized): Secondary | ICD-10-CM | POA: Diagnosis not present

## 2024-03-09 DIAGNOSIS — D469 Myelodysplastic syndrome, unspecified: Secondary | ICD-10-CM | POA: Diagnosis not present

## 2024-03-09 DIAGNOSIS — R2681 Unsteadiness on feet: Secondary | ICD-10-CM | POA: Diagnosis not present

## 2024-03-09 DIAGNOSIS — M48061 Spinal stenosis, lumbar region without neurogenic claudication: Secondary | ICD-10-CM | POA: Diagnosis not present

## 2024-03-09 DIAGNOSIS — M6281 Muscle weakness (generalized): Secondary | ICD-10-CM | POA: Diagnosis not present

## 2024-03-09 DIAGNOSIS — R112 Nausea with vomiting, unspecified: Secondary | ICD-10-CM | POA: Diagnosis not present

## 2024-03-12 DIAGNOSIS — M48061 Spinal stenosis, lumbar region without neurogenic claudication: Secondary | ICD-10-CM | POA: Diagnosis not present

## 2024-03-12 DIAGNOSIS — R2681 Unsteadiness on feet: Secondary | ICD-10-CM | POA: Diagnosis not present

## 2024-03-12 DIAGNOSIS — M6281 Muscle weakness (generalized): Secondary | ICD-10-CM | POA: Diagnosis not present

## 2024-03-12 DIAGNOSIS — D469 Myelodysplastic syndrome, unspecified: Secondary | ICD-10-CM | POA: Diagnosis not present

## 2024-03-13 DIAGNOSIS — R2681 Unsteadiness on feet: Secondary | ICD-10-CM | POA: Diagnosis not present

## 2024-03-13 DIAGNOSIS — M48061 Spinal stenosis, lumbar region without neurogenic claudication: Secondary | ICD-10-CM | POA: Diagnosis not present

## 2024-03-13 DIAGNOSIS — D469 Myelodysplastic syndrome, unspecified: Secondary | ICD-10-CM | POA: Diagnosis not present

## 2024-03-13 DIAGNOSIS — M6281 Muscle weakness (generalized): Secondary | ICD-10-CM | POA: Diagnosis not present

## 2024-03-14 DIAGNOSIS — R2681 Unsteadiness on feet: Secondary | ICD-10-CM | POA: Diagnosis not present

## 2024-03-14 DIAGNOSIS — N184 Chronic kidney disease, stage 4 (severe): Secondary | ICD-10-CM | POA: Diagnosis not present

## 2024-03-14 DIAGNOSIS — D472 Monoclonal gammopathy: Secondary | ICD-10-CM | POA: Diagnosis not present

## 2024-03-14 DIAGNOSIS — R600 Localized edema: Secondary | ICD-10-CM | POA: Diagnosis not present

## 2024-03-14 DIAGNOSIS — I10 Essential (primary) hypertension: Secondary | ICD-10-CM | POA: Diagnosis not present

## 2024-03-14 DIAGNOSIS — D469 Myelodysplastic syndrome, unspecified: Secondary | ICD-10-CM | POA: Diagnosis not present

## 2024-03-14 DIAGNOSIS — D638 Anemia in other chronic diseases classified elsewhere: Secondary | ICD-10-CM | POA: Diagnosis not present

## 2024-03-14 DIAGNOSIS — N390 Urinary tract infection, site not specified: Secondary | ICD-10-CM | POA: Diagnosis not present

## 2024-03-14 DIAGNOSIS — M6281 Muscle weakness (generalized): Secondary | ICD-10-CM | POA: Diagnosis not present

## 2024-03-14 DIAGNOSIS — M25462 Effusion, left knee: Secondary | ICD-10-CM | POA: Diagnosis not present

## 2024-03-14 DIAGNOSIS — M48061 Spinal stenosis, lumbar region without neurogenic claudication: Secondary | ICD-10-CM | POA: Diagnosis not present

## 2024-03-16 DIAGNOSIS — Z5181 Encounter for therapeutic drug level monitoring: Secondary | ICD-10-CM | POA: Diagnosis not present

## 2024-03-16 DIAGNOSIS — Z7901 Long term (current) use of anticoagulants: Secondary | ICD-10-CM | POA: Diagnosis not present

## 2024-03-16 DIAGNOSIS — I482 Chronic atrial fibrillation, unspecified: Secondary | ICD-10-CM | POA: Diagnosis not present

## 2024-03-19 DIAGNOSIS — M6281 Muscle weakness (generalized): Secondary | ICD-10-CM | POA: Diagnosis not present

## 2024-03-19 DIAGNOSIS — R2681 Unsteadiness on feet: Secondary | ICD-10-CM | POA: Diagnosis not present

## 2024-03-19 DIAGNOSIS — D469 Myelodysplastic syndrome, unspecified: Secondary | ICD-10-CM | POA: Diagnosis not present

## 2024-03-19 DIAGNOSIS — M48061 Spinal stenosis, lumbar region without neurogenic claudication: Secondary | ICD-10-CM | POA: Diagnosis not present

## 2024-03-21 DIAGNOSIS — R2681 Unsteadiness on feet: Secondary | ICD-10-CM | POA: Diagnosis not present

## 2024-03-21 DIAGNOSIS — M48061 Spinal stenosis, lumbar region without neurogenic claudication: Secondary | ICD-10-CM | POA: Diagnosis not present

## 2024-03-21 DIAGNOSIS — M6281 Muscle weakness (generalized): Secondary | ICD-10-CM | POA: Diagnosis not present

## 2024-03-21 DIAGNOSIS — D469 Myelodysplastic syndrome, unspecified: Secondary | ICD-10-CM | POA: Diagnosis not present

## 2024-03-22 DIAGNOSIS — D469 Myelodysplastic syndrome, unspecified: Secondary | ICD-10-CM | POA: Diagnosis not present

## 2024-03-22 DIAGNOSIS — R2681 Unsteadiness on feet: Secondary | ICD-10-CM | POA: Diagnosis not present

## 2024-03-22 DIAGNOSIS — M48061 Spinal stenosis, lumbar region without neurogenic claudication: Secondary | ICD-10-CM | POA: Diagnosis not present

## 2024-03-22 DIAGNOSIS — M6281 Muscle weakness (generalized): Secondary | ICD-10-CM | POA: Diagnosis not present

## 2024-03-26 DIAGNOSIS — D469 Myelodysplastic syndrome, unspecified: Secondary | ICD-10-CM | POA: Diagnosis not present

## 2024-03-26 DIAGNOSIS — R2681 Unsteadiness on feet: Secondary | ICD-10-CM | POA: Diagnosis not present

## 2024-03-26 DIAGNOSIS — M6281 Muscle weakness (generalized): Secondary | ICD-10-CM | POA: Diagnosis not present

## 2024-03-26 DIAGNOSIS — M48061 Spinal stenosis, lumbar region without neurogenic claudication: Secondary | ICD-10-CM | POA: Diagnosis not present

## 2024-03-29 DIAGNOSIS — M48061 Spinal stenosis, lumbar region without neurogenic claudication: Secondary | ICD-10-CM | POA: Diagnosis not present

## 2024-03-29 DIAGNOSIS — M6281 Muscle weakness (generalized): Secondary | ICD-10-CM | POA: Diagnosis not present

## 2024-03-29 DIAGNOSIS — D469 Myelodysplastic syndrome, unspecified: Secondary | ICD-10-CM | POA: Diagnosis not present

## 2024-03-29 DIAGNOSIS — R2681 Unsteadiness on feet: Secondary | ICD-10-CM | POA: Diagnosis not present

## 2024-03-30 DIAGNOSIS — M6281 Muscle weakness (generalized): Secondary | ICD-10-CM | POA: Diagnosis not present

## 2024-04-02 DIAGNOSIS — M25551 Pain in right hip: Secondary | ICD-10-CM | POA: Diagnosis not present

## 2024-04-02 DIAGNOSIS — M6281 Muscle weakness (generalized): Secondary | ICD-10-CM | POA: Diagnosis not present

## 2024-04-03 DIAGNOSIS — M85851 Other specified disorders of bone density and structure, right thigh: Secondary | ICD-10-CM | POA: Diagnosis not present

## 2024-04-03 DIAGNOSIS — M25551 Pain in right hip: Secondary | ICD-10-CM | POA: Diagnosis not present

## 2024-04-04 DIAGNOSIS — R2689 Other abnormalities of gait and mobility: Secondary | ICD-10-CM | POA: Diagnosis not present

## 2024-04-04 DIAGNOSIS — M6281 Muscle weakness (generalized): Secondary | ICD-10-CM | POA: Diagnosis not present

## 2024-04-05 DIAGNOSIS — I10 Essential (primary) hypertension: Secondary | ICD-10-CM | POA: Diagnosis not present

## 2024-04-05 DIAGNOSIS — I482 Chronic atrial fibrillation, unspecified: Secondary | ICD-10-CM | POA: Diagnosis not present

## 2024-04-05 DIAGNOSIS — Z86718 Personal history of other venous thrombosis and embolism: Secondary | ICD-10-CM | POA: Diagnosis not present

## 2024-04-05 DIAGNOSIS — M6281 Muscle weakness (generalized): Secondary | ICD-10-CM | POA: Diagnosis not present

## 2024-04-05 DIAGNOSIS — R2689 Other abnormalities of gait and mobility: Secondary | ICD-10-CM | POA: Diagnosis not present

## 2024-04-05 DIAGNOSIS — K219 Gastro-esophageal reflux disease without esophagitis: Secondary | ICD-10-CM | POA: Diagnosis not present

## 2024-04-05 DIAGNOSIS — N184 Chronic kidney disease, stage 4 (severe): Secondary | ICD-10-CM | POA: Diagnosis not present

## 2024-04-05 DIAGNOSIS — Z89612 Acquired absence of left leg above knee: Secondary | ICD-10-CM | POA: Diagnosis not present

## 2024-04-05 DIAGNOSIS — Z7901 Long term (current) use of anticoagulants: Secondary | ICD-10-CM | POA: Diagnosis not present

## 2024-04-09 DIAGNOSIS — B351 Tinea unguium: Secondary | ICD-10-CM | POA: Diagnosis not present

## 2024-04-09 DIAGNOSIS — E1159 Type 2 diabetes mellitus with other circulatory complications: Secondary | ICD-10-CM | POA: Diagnosis not present

## 2024-04-09 DIAGNOSIS — M6281 Muscle weakness (generalized): Secondary | ICD-10-CM | POA: Diagnosis not present

## 2024-04-10 DIAGNOSIS — L03031 Cellulitis of right toe: Secondary | ICD-10-CM | POA: Diagnosis not present

## 2024-04-10 DIAGNOSIS — Z89612 Acquired absence of left leg above knee: Secondary | ICD-10-CM | POA: Diagnosis not present

## 2024-04-12 DIAGNOSIS — M6281 Muscle weakness (generalized): Secondary | ICD-10-CM | POA: Diagnosis not present

## 2024-04-12 DIAGNOSIS — R2689 Other abnormalities of gait and mobility: Secondary | ICD-10-CM | POA: Diagnosis not present

## 2024-04-13 DIAGNOSIS — R2689 Other abnormalities of gait and mobility: Secondary | ICD-10-CM | POA: Diagnosis not present

## 2024-04-13 DIAGNOSIS — M6281 Muscle weakness (generalized): Secondary | ICD-10-CM | POA: Diagnosis not present

## 2024-04-17 DIAGNOSIS — M6281 Muscle weakness (generalized): Secondary | ICD-10-CM | POA: Diagnosis not present

## 2024-04-17 DIAGNOSIS — R2689 Other abnormalities of gait and mobility: Secondary | ICD-10-CM | POA: Diagnosis not present

## 2024-04-19 DIAGNOSIS — M62838 Other muscle spasm: Secondary | ICD-10-CM | POA: Diagnosis not present

## 2024-04-19 DIAGNOSIS — Z89612 Acquired absence of left leg above knee: Secondary | ICD-10-CM | POA: Diagnosis not present

## 2024-04-19 DIAGNOSIS — R2689 Other abnormalities of gait and mobility: Secondary | ICD-10-CM | POA: Diagnosis not present

## 2024-04-19 DIAGNOSIS — M6281 Muscle weakness (generalized): Secondary | ICD-10-CM | POA: Diagnosis not present

## 2024-04-20 DIAGNOSIS — R21 Rash and other nonspecific skin eruption: Secondary | ICD-10-CM | POA: Diagnosis not present

## 2024-04-21 DIAGNOSIS — M6281 Muscle weakness (generalized): Secondary | ICD-10-CM | POA: Diagnosis not present

## 2024-04-24 DIAGNOSIS — R1031 Right lower quadrant pain: Secondary | ICD-10-CM | POA: Diagnosis not present

## 2024-04-24 DIAGNOSIS — R935 Abnormal findings on diagnostic imaging of other abdominal regions, including retroperitoneum: Secondary | ICD-10-CM | POA: Diagnosis not present

## 2024-04-24 DIAGNOSIS — R932 Abnormal findings on diagnostic imaging of liver and biliary tract: Secondary | ICD-10-CM | POA: Diagnosis not present

## 2024-04-24 DIAGNOSIS — Z862 Personal history of diseases of the blood and blood-forming organs and certain disorders involving the immune mechanism: Secondary | ICD-10-CM | POA: Diagnosis not present

## 2024-04-24 DIAGNOSIS — K5900 Constipation, unspecified: Secondary | ICD-10-CM | POA: Diagnosis not present

## 2024-04-24 DIAGNOSIS — R110 Nausea: Secondary | ICD-10-CM | POA: Diagnosis not present

## 2024-04-24 DIAGNOSIS — I771 Stricture of artery: Secondary | ICD-10-CM | POA: Diagnosis not present

## 2024-04-24 DIAGNOSIS — R1084 Generalized abdominal pain: Secondary | ICD-10-CM | POA: Diagnosis not present

## 2024-04-24 DIAGNOSIS — K8689 Other specified diseases of pancreas: Secondary | ICD-10-CM | POA: Diagnosis not present

## 2024-04-24 DIAGNOSIS — D696 Thrombocytopenia, unspecified: Secondary | ICD-10-CM | POA: Diagnosis not present

## 2024-04-24 DIAGNOSIS — K5730 Diverticulosis of large intestine without perforation or abscess without bleeding: Secondary | ICD-10-CM | POA: Diagnosis not present

## 2024-04-24 DIAGNOSIS — Z8719 Personal history of other diseases of the digestive system: Secondary | ICD-10-CM | POA: Diagnosis not present

## 2024-04-24 DIAGNOSIS — R079 Chest pain, unspecified: Secondary | ICD-10-CM | POA: Diagnosis not present

## 2024-04-24 DIAGNOSIS — R1032 Left lower quadrant pain: Secondary | ICD-10-CM | POA: Diagnosis not present

## 2024-04-25 DIAGNOSIS — I252 Old myocardial infarction: Secondary | ICD-10-CM | POA: Diagnosis not present

## 2024-04-25 DIAGNOSIS — K5900 Constipation, unspecified: Secondary | ICD-10-CM | POA: Diagnosis not present

## 2024-04-26 DIAGNOSIS — M6281 Muscle weakness (generalized): Secondary | ICD-10-CM | POA: Diagnosis not present

## 2024-04-27 DIAGNOSIS — Z5181 Encounter for therapeutic drug level monitoring: Secondary | ICD-10-CM | POA: Diagnosis not present

## 2024-04-27 DIAGNOSIS — I482 Chronic atrial fibrillation, unspecified: Secondary | ICD-10-CM | POA: Diagnosis not present

## 2024-04-27 DIAGNOSIS — Z7901 Long term (current) use of anticoagulants: Secondary | ICD-10-CM | POA: Diagnosis not present

## 2024-04-27 DIAGNOSIS — M6281 Muscle weakness (generalized): Secondary | ICD-10-CM | POA: Diagnosis not present

## 2024-04-27 DIAGNOSIS — R2689 Other abnormalities of gait and mobility: Secondary | ICD-10-CM | POA: Diagnosis not present

## 2024-05-01 ENCOUNTER — Encounter: Payer: Self-pay | Admitting: Sports Medicine

## 2024-05-04 DIAGNOSIS — Z7901 Long term (current) use of anticoagulants: Secondary | ICD-10-CM | POA: Diagnosis not present

## 2024-05-04 DIAGNOSIS — R52 Pain, unspecified: Secondary | ICD-10-CM | POA: Diagnosis not present

## 2024-05-04 DIAGNOSIS — I482 Chronic atrial fibrillation, unspecified: Secondary | ICD-10-CM | POA: Diagnosis not present

## 2024-05-07 DIAGNOSIS — I482 Chronic atrial fibrillation, unspecified: Secondary | ICD-10-CM | POA: Diagnosis not present

## 2024-05-07 DIAGNOSIS — R52 Pain, unspecified: Secondary | ICD-10-CM | POA: Diagnosis not present

## 2024-05-07 DIAGNOSIS — Z7901 Long term (current) use of anticoagulants: Secondary | ICD-10-CM | POA: Diagnosis not present

## 2024-05-10 DIAGNOSIS — Z7901 Long term (current) use of anticoagulants: Secondary | ICD-10-CM | POA: Diagnosis not present

## 2024-05-10 DIAGNOSIS — Z89612 Acquired absence of left leg above knee: Secondary | ICD-10-CM | POA: Diagnosis not present

## 2024-05-11 DIAGNOSIS — L309 Dermatitis, unspecified: Secondary | ICD-10-CM | POA: Diagnosis not present

## 2024-05-14 DIAGNOSIS — R52 Pain, unspecified: Secondary | ICD-10-CM | POA: Diagnosis not present

## 2024-05-14 DIAGNOSIS — Z87440 Personal history of urinary (tract) infections: Secondary | ICD-10-CM | POA: Diagnosis not present

## 2024-05-16 DIAGNOSIS — R52 Pain, unspecified: Secondary | ICD-10-CM | POA: Diagnosis not present

## 2024-05-16 DIAGNOSIS — Z89612 Acquired absence of left leg above knee: Secondary | ICD-10-CM | POA: Diagnosis not present

## 2024-05-18 DIAGNOSIS — Z87440 Personal history of urinary (tract) infections: Secondary | ICD-10-CM | POA: Diagnosis not present

## 2024-05-18 DIAGNOSIS — R52 Pain, unspecified: Secondary | ICD-10-CM | POA: Diagnosis not present

## 2024-05-22 DIAGNOSIS — M6281 Muscle weakness (generalized): Secondary | ICD-10-CM | POA: Diagnosis not present

## 2024-05-22 DIAGNOSIS — R2689 Other abnormalities of gait and mobility: Secondary | ICD-10-CM | POA: Diagnosis not present

## 2024-05-24 DIAGNOSIS — R2689 Other abnormalities of gait and mobility: Secondary | ICD-10-CM | POA: Diagnosis not present

## 2024-05-24 DIAGNOSIS — M6281 Muscle weakness (generalized): Secondary | ICD-10-CM | POA: Diagnosis not present

## 2024-05-25 DIAGNOSIS — R2689 Other abnormalities of gait and mobility: Secondary | ICD-10-CM | POA: Diagnosis not present

## 2024-05-25 DIAGNOSIS — M6281 Muscle weakness (generalized): Secondary | ICD-10-CM | POA: Diagnosis not present

## 2024-05-28 DIAGNOSIS — M6281 Muscle weakness (generalized): Secondary | ICD-10-CM | POA: Diagnosis not present

## 2024-05-28 DIAGNOSIS — I10 Essential (primary) hypertension: Secondary | ICD-10-CM | POA: Diagnosis not present

## 2024-05-28 DIAGNOSIS — K219 Gastro-esophageal reflux disease without esophagitis: Secondary | ICD-10-CM | POA: Diagnosis not present

## 2024-05-28 DIAGNOSIS — R2689 Other abnormalities of gait and mobility: Secondary | ICD-10-CM | POA: Diagnosis not present

## 2024-05-28 DIAGNOSIS — N184 Chronic kidney disease, stage 4 (severe): Secondary | ICD-10-CM | POA: Diagnosis not present

## 2024-05-29 DIAGNOSIS — R52 Pain, unspecified: Secondary | ICD-10-CM | POA: Diagnosis not present

## 2024-06-18 DIAGNOSIS — Z89612 Acquired absence of left leg above knee: Secondary | ICD-10-CM | POA: Diagnosis not present

## 2024-06-18 DIAGNOSIS — K5900 Constipation, unspecified: Secondary | ICD-10-CM | POA: Diagnosis not present

## 2024-06-18 DIAGNOSIS — R52 Pain, unspecified: Secondary | ICD-10-CM | POA: Diagnosis not present

## 2024-06-21 DIAGNOSIS — Z7901 Long term (current) use of anticoagulants: Secondary | ICD-10-CM | POA: Diagnosis not present

## 2024-06-21 DIAGNOSIS — I482 Chronic atrial fibrillation, unspecified: Secondary | ICD-10-CM | POA: Diagnosis not present

## 2024-06-29 DIAGNOSIS — H16231 Neurotrophic keratoconjunctivitis, right eye: Secondary | ICD-10-CM | POA: Diagnosis not present

## 2024-09-10 ENCOUNTER — Encounter: Payer: Self-pay | Admitting: *Deleted
